# Patient Record
Sex: Female | Born: 1965 | Race: White | Hispanic: No | Marital: Married | Smoking: Never smoker
Health system: Southern US, Community
[De-identification: ages and names within clinical notes are randomized; demographics above are authoritative.]

## PROBLEM LIST (undated history)

## (undated) ENCOUNTER — Emergency Department (HOSPITAL_BASED_OUTPATIENT_CLINIC_OR_DEPARTMENT_OTHER): Admission: EM | Payer: BC Managed Care – PPO | Source: Home / Self Care

## (undated) DIAGNOSIS — D594 Other nonautoimmune hemolytic anemias: Secondary | ICD-10-CM

## (undated) DIAGNOSIS — I10 Essential (primary) hypertension: Secondary | ICD-10-CM

## (undated) DIAGNOSIS — E669 Obesity, unspecified: Secondary | ICD-10-CM

## (undated) DIAGNOSIS — R519 Headache, unspecified: Secondary | ICD-10-CM

## (undated) DIAGNOSIS — F419 Anxiety disorder, unspecified: Secondary | ICD-10-CM

## (undated) DIAGNOSIS — R51 Headache: Secondary | ICD-10-CM

## (undated) DIAGNOSIS — M1990 Unspecified osteoarthritis, unspecified site: Secondary | ICD-10-CM

## (undated) DIAGNOSIS — Z8719 Personal history of other diseases of the digestive system: Secondary | ICD-10-CM

## (undated) HISTORY — DX: Essential (primary) hypertension: I10

## (undated) HISTORY — PX: APPENDECTOMY: SHX54

## (undated) HISTORY — PX: VAGINAL HYSTERECTOMY: SUR661

## (undated) HISTORY — DX: Other nonautoimmune hemolytic anemias: D59.4

## (undated) HISTORY — PX: OOPHORECTOMY: SHX86

## (undated) HISTORY — PX: BACK SURGERY: SHX140

## (undated) HISTORY — DX: Anxiety disorder, unspecified: F41.9

## (undated) HISTORY — PX: TUBAL LIGATION: SHX77

## (undated) HISTORY — DX: Obesity, unspecified: E66.9

## (undated) HISTORY — PX: TOTAL ABDOMINAL HYSTERECTOMY: SHX209

---

## 1992-03-17 HISTORY — PX: LAPAROSCOPIC CHOLECYSTECTOMY: SUR755

## 1997-08-30 ENCOUNTER — Ambulatory Visit (HOSPITAL_COMMUNITY): Admission: RE | Admit: 1997-08-30 | Discharge: 1997-08-30 | Payer: Self-pay | Admitting: Internal Medicine

## 1998-07-19 ENCOUNTER — Encounter: Payer: Self-pay | Admitting: Urology

## 1998-07-19 ENCOUNTER — Inpatient Hospital Stay (HOSPITAL_COMMUNITY): Admission: RE | Admit: 1998-07-19 | Discharge: 1998-07-21 | Payer: Self-pay | Admitting: Gynecology

## 1998-09-12 ENCOUNTER — Encounter: Admission: RE | Admit: 1998-09-12 | Discharge: 1998-12-11 | Payer: Self-pay | Admitting: Gynecology

## 1999-01-16 ENCOUNTER — Encounter: Payer: Self-pay | Admitting: Internal Medicine

## 1999-01-16 ENCOUNTER — Emergency Department (HOSPITAL_COMMUNITY): Admission: EM | Admit: 1999-01-16 | Discharge: 1999-01-16 | Payer: Self-pay | Admitting: Internal Medicine

## 2000-09-10 ENCOUNTER — Other Ambulatory Visit: Admission: RE | Admit: 2000-09-10 | Discharge: 2000-09-10 | Payer: Self-pay | Admitting: Gynecology

## 2001-12-08 ENCOUNTER — Other Ambulatory Visit: Admission: RE | Admit: 2001-12-08 | Discharge: 2001-12-08 | Payer: Self-pay | Admitting: Gynecology

## 2002-04-20 ENCOUNTER — Encounter: Payer: Self-pay | Admitting: Gynecology

## 2002-04-20 ENCOUNTER — Encounter (INDEPENDENT_AMBULATORY_CARE_PROVIDER_SITE_OTHER): Payer: Self-pay

## 2002-04-20 ENCOUNTER — Inpatient Hospital Stay (HOSPITAL_COMMUNITY): Admission: RE | Admit: 2002-04-20 | Discharge: 2002-04-22 | Payer: Self-pay | Admitting: Gynecology

## 2002-12-26 ENCOUNTER — Encounter: Admission: RE | Admit: 2002-12-26 | Discharge: 2002-12-26 | Payer: Self-pay | Admitting: Family Medicine

## 2002-12-26 ENCOUNTER — Encounter: Payer: Self-pay | Admitting: Family Medicine

## 2003-05-24 ENCOUNTER — Ambulatory Visit (HOSPITAL_COMMUNITY): Admission: RE | Admit: 2003-05-24 | Discharge: 2003-05-24 | Payer: Self-pay | Admitting: Internal Medicine

## 2003-11-24 ENCOUNTER — Other Ambulatory Visit: Admission: RE | Admit: 2003-11-24 | Discharge: 2003-11-24 | Payer: Self-pay | Admitting: Gynecology

## 2004-02-12 ENCOUNTER — Ambulatory Visit: Payer: Self-pay | Admitting: Internal Medicine

## 2004-02-23 ENCOUNTER — Ambulatory Visit: Payer: Self-pay | Admitting: Family Medicine

## 2004-04-19 ENCOUNTER — Ambulatory Visit: Payer: Self-pay | Admitting: Family Medicine

## 2004-06-07 ENCOUNTER — Ambulatory Visit: Payer: Self-pay | Admitting: Family Medicine

## 2004-09-05 ENCOUNTER — Ambulatory Visit: Payer: Self-pay | Admitting: Family Medicine

## 2005-01-13 ENCOUNTER — Other Ambulatory Visit: Admission: RE | Admit: 2005-01-13 | Discharge: 2005-01-13 | Payer: Self-pay | Admitting: Gynecology

## 2005-01-16 ENCOUNTER — Encounter (INDEPENDENT_AMBULATORY_CARE_PROVIDER_SITE_OTHER): Payer: Self-pay | Admitting: Specialist

## 2005-01-16 ENCOUNTER — Ambulatory Visit (HOSPITAL_COMMUNITY): Admission: RE | Admit: 2005-01-16 | Discharge: 2005-01-16 | Payer: Self-pay | Admitting: Family Medicine

## 2005-01-16 ENCOUNTER — Ambulatory Visit: Payer: Self-pay | Admitting: Family Medicine

## 2005-01-17 ENCOUNTER — Observation Stay (HOSPITAL_COMMUNITY): Admission: EM | Admit: 2005-01-17 | Discharge: 2005-01-18 | Payer: Self-pay | Admitting: General Surgery

## 2005-02-18 ENCOUNTER — Ambulatory Visit: Payer: Self-pay | Admitting: Family Medicine

## 2005-02-20 ENCOUNTER — Ambulatory Visit: Payer: Self-pay | Admitting: Family Medicine

## 2005-02-20 ENCOUNTER — Inpatient Hospital Stay (HOSPITAL_COMMUNITY): Admission: EM | Admit: 2005-02-20 | Discharge: 2005-02-23 | Payer: Self-pay | Admitting: Emergency Medicine

## 2005-02-25 ENCOUNTER — Ambulatory Visit: Payer: Self-pay | Admitting: Family Medicine

## 2005-03-21 ENCOUNTER — Ambulatory Visit: Payer: Self-pay | Admitting: Family Medicine

## 2005-03-25 ENCOUNTER — Ambulatory Visit: Payer: Self-pay | Admitting: Family Medicine

## 2005-06-06 ENCOUNTER — Ambulatory Visit: Payer: Self-pay | Admitting: Family Medicine

## 2005-06-25 ENCOUNTER — Ambulatory Visit: Payer: Self-pay

## 2005-08-12 ENCOUNTER — Ambulatory Visit: Payer: Self-pay | Admitting: Family Medicine

## 2005-11-04 ENCOUNTER — Ambulatory Visit: Payer: Self-pay | Admitting: Family Medicine

## 2006-01-03 ENCOUNTER — Ambulatory Visit: Payer: Self-pay | Admitting: Family Medicine

## 2006-02-20 ENCOUNTER — Ambulatory Visit: Payer: Self-pay | Admitting: Family Medicine

## 2006-04-07 ENCOUNTER — Ambulatory Visit: Payer: Self-pay | Admitting: Family Medicine

## 2006-04-20 DIAGNOSIS — F411 Generalized anxiety disorder: Secondary | ICD-10-CM | POA: Insufficient documentation

## 2006-04-20 DIAGNOSIS — J45901 Unspecified asthma with (acute) exacerbation: Secondary | ICD-10-CM | POA: Insufficient documentation

## 2006-04-20 DIAGNOSIS — Z8669 Personal history of other diseases of the nervous system and sense organs: Secondary | ICD-10-CM

## 2006-05-08 ENCOUNTER — Ambulatory Visit: Payer: Self-pay | Admitting: Family Medicine

## 2006-05-08 ENCOUNTER — Encounter: Admission: RE | Admit: 2006-05-08 | Discharge: 2006-05-08 | Payer: Self-pay | Admitting: Family Medicine

## 2006-05-26 ENCOUNTER — Other Ambulatory Visit: Admission: RE | Admit: 2006-05-26 | Discharge: 2006-05-26 | Payer: Self-pay | Admitting: Gynecology

## 2006-09-01 ENCOUNTER — Encounter: Payer: Self-pay | Admitting: Family Medicine

## 2006-09-01 ENCOUNTER — Telehealth (INDEPENDENT_AMBULATORY_CARE_PROVIDER_SITE_OTHER): Payer: Self-pay | Admitting: *Deleted

## 2006-10-15 ENCOUNTER — Ambulatory Visit: Payer: Self-pay | Admitting: Family Medicine

## 2006-10-15 DIAGNOSIS — J209 Acute bronchitis, unspecified: Secondary | ICD-10-CM | POA: Insufficient documentation

## 2006-10-16 ENCOUNTER — Telehealth (INDEPENDENT_AMBULATORY_CARE_PROVIDER_SITE_OTHER): Payer: Self-pay | Admitting: *Deleted

## 2006-11-23 ENCOUNTER — Ambulatory Visit: Payer: Self-pay | Admitting: Family Medicine

## 2006-11-24 ENCOUNTER — Ambulatory Visit: Payer: Self-pay | Admitting: Family Medicine

## 2006-11-24 DIAGNOSIS — J019 Acute sinusitis, unspecified: Secondary | ICD-10-CM

## 2006-11-24 LAB — CONVERTED CEMR LAB
Bilirubin Urine: NEGATIVE
Glucose, Urine, Semiquant: NEGATIVE
pH: 6.5

## 2006-12-07 ENCOUNTER — Telehealth (INDEPENDENT_AMBULATORY_CARE_PROVIDER_SITE_OTHER): Payer: Self-pay | Admitting: *Deleted

## 2007-01-14 ENCOUNTER — Telehealth (INDEPENDENT_AMBULATORY_CARE_PROVIDER_SITE_OTHER): Payer: Self-pay | Admitting: *Deleted

## 2007-01-20 ENCOUNTER — Ambulatory Visit: Payer: Self-pay | Admitting: Family Medicine

## 2007-01-29 ENCOUNTER — Ambulatory Visit: Payer: Self-pay | Admitting: Family Medicine

## 2007-03-22 ENCOUNTER — Telehealth (INDEPENDENT_AMBULATORY_CARE_PROVIDER_SITE_OTHER): Payer: Self-pay | Admitting: *Deleted

## 2007-04-23 DIAGNOSIS — J069 Acute upper respiratory infection, unspecified: Secondary | ICD-10-CM | POA: Insufficient documentation

## 2007-04-30 ENCOUNTER — Telehealth (INDEPENDENT_AMBULATORY_CARE_PROVIDER_SITE_OTHER): Payer: Self-pay | Admitting: *Deleted

## 2007-05-01 ENCOUNTER — Ambulatory Visit: Payer: Self-pay | Admitting: Family Medicine

## 2007-05-01 DIAGNOSIS — G43701 Chronic migraine without aura, not intractable, with status migrainosus: Secondary | ICD-10-CM | POA: Insufficient documentation

## 2007-05-03 ENCOUNTER — Ambulatory Visit: Payer: Self-pay | Admitting: Family Medicine

## 2007-05-07 ENCOUNTER — Telehealth (INDEPENDENT_AMBULATORY_CARE_PROVIDER_SITE_OTHER): Payer: Self-pay | Admitting: *Deleted

## 2007-05-12 ENCOUNTER — Telehealth (INDEPENDENT_AMBULATORY_CARE_PROVIDER_SITE_OTHER): Payer: Self-pay | Admitting: *Deleted

## 2007-05-13 ENCOUNTER — Telehealth (INDEPENDENT_AMBULATORY_CARE_PROVIDER_SITE_OTHER): Payer: Self-pay | Admitting: *Deleted

## 2007-05-14 ENCOUNTER — Ambulatory Visit: Payer: Self-pay

## 2007-05-14 ENCOUNTER — Telehealth: Payer: Self-pay | Admitting: Family Medicine

## 2007-05-14 ENCOUNTER — Ambulatory Visit: Payer: Self-pay | Admitting: Family Medicine

## 2007-05-14 DIAGNOSIS — R300 Dysuria: Secondary | ICD-10-CM | POA: Insufficient documentation

## 2007-05-14 DIAGNOSIS — R609 Edema, unspecified: Secondary | ICD-10-CM

## 2007-05-14 DIAGNOSIS — R252 Cramp and spasm: Secondary | ICD-10-CM

## 2007-05-15 ENCOUNTER — Encounter: Payer: Self-pay | Admitting: Family Medicine

## 2007-05-17 ENCOUNTER — Telehealth (INDEPENDENT_AMBULATORY_CARE_PROVIDER_SITE_OTHER): Payer: Self-pay | Admitting: *Deleted

## 2007-05-18 ENCOUNTER — Telehealth (INDEPENDENT_AMBULATORY_CARE_PROVIDER_SITE_OTHER): Payer: Self-pay | Admitting: *Deleted

## 2007-05-19 ENCOUNTER — Ambulatory Visit: Payer: Self-pay | Admitting: Family Medicine

## 2007-05-20 ENCOUNTER — Telehealth (INDEPENDENT_AMBULATORY_CARE_PROVIDER_SITE_OTHER): Payer: Self-pay | Admitting: *Deleted

## 2007-05-20 LAB — CONVERTED CEMR LAB: Sed Rate: 29 mm/hr — ABNORMAL HIGH (ref 0–25)

## 2007-05-26 ENCOUNTER — Ambulatory Visit: Payer: Self-pay | Admitting: Family Medicine

## 2007-05-26 DIAGNOSIS — M545 Low back pain: Secondary | ICD-10-CM | POA: Insufficient documentation

## 2007-08-20 ENCOUNTER — Ambulatory Visit: Payer: Self-pay | Admitting: Family Medicine

## 2007-08-26 ENCOUNTER — Ambulatory Visit: Payer: Self-pay | Admitting: Family Medicine

## 2007-08-27 ENCOUNTER — Telehealth (INDEPENDENT_AMBULATORY_CARE_PROVIDER_SITE_OTHER): Payer: Self-pay | Admitting: *Deleted

## 2007-09-28 ENCOUNTER — Emergency Department (HOSPITAL_COMMUNITY): Admission: EM | Admit: 2007-09-28 | Discharge: 2007-09-28 | Payer: Self-pay | Admitting: Emergency Medicine

## 2007-10-06 ENCOUNTER — Ambulatory Visit: Payer: Self-pay | Admitting: Family Medicine

## 2007-10-06 DIAGNOSIS — M79609 Pain in unspecified limb: Secondary | ICD-10-CM

## 2007-11-24 ENCOUNTER — Telehealth (INDEPENDENT_AMBULATORY_CARE_PROVIDER_SITE_OTHER): Payer: Self-pay | Admitting: *Deleted

## 2007-11-26 ENCOUNTER — Ambulatory Visit: Payer: Self-pay | Admitting: Family Medicine

## 2007-12-02 ENCOUNTER — Ambulatory Visit: Payer: Self-pay | Admitting: Family Medicine

## 2007-12-29 ENCOUNTER — Telehealth (INDEPENDENT_AMBULATORY_CARE_PROVIDER_SITE_OTHER): Payer: Self-pay | Admitting: *Deleted

## 2007-12-30 ENCOUNTER — Telehealth (INDEPENDENT_AMBULATORY_CARE_PROVIDER_SITE_OTHER): Payer: Self-pay | Admitting: *Deleted

## 2008-01-03 ENCOUNTER — Telehealth: Payer: Self-pay | Admitting: Internal Medicine

## 2008-01-03 ENCOUNTER — Telehealth (INDEPENDENT_AMBULATORY_CARE_PROVIDER_SITE_OTHER): Payer: Self-pay | Admitting: *Deleted

## 2008-01-04 ENCOUNTER — Encounter: Payer: Self-pay | Admitting: Family Medicine

## 2008-01-13 ENCOUNTER — Encounter: Payer: Self-pay | Admitting: Family Medicine

## 2008-02-02 ENCOUNTER — Ambulatory Visit: Payer: Self-pay | Admitting: Family Medicine

## 2008-02-08 ENCOUNTER — Ambulatory Visit: Payer: Self-pay | Admitting: Family Medicine

## 2008-02-08 DIAGNOSIS — I10 Essential (primary) hypertension: Secondary | ICD-10-CM | POA: Insufficient documentation

## 2008-02-24 ENCOUNTER — Ambulatory Visit: Payer: Self-pay | Admitting: Family Medicine

## 2008-02-26 LAB — CONVERTED CEMR LAB
BUN: 8 mg/dL (ref 6–23)
Chloride: 103 meq/L (ref 96–112)
GFR calc non Af Amer: 97 mL/min
Potassium: 3.9 meq/L (ref 3.5–5.1)
Sodium: 140 meq/L (ref 135–145)

## 2008-02-29 ENCOUNTER — Encounter (INDEPENDENT_AMBULATORY_CARE_PROVIDER_SITE_OTHER): Payer: Self-pay | Admitting: *Deleted

## 2008-05-02 ENCOUNTER — Ambulatory Visit: Payer: Self-pay | Admitting: Family Medicine

## 2008-05-23 ENCOUNTER — Telehealth: Payer: Self-pay | Admitting: Family Medicine

## 2008-06-15 ENCOUNTER — Telehealth (INDEPENDENT_AMBULATORY_CARE_PROVIDER_SITE_OTHER): Payer: Self-pay | Admitting: *Deleted

## 2008-06-27 ENCOUNTER — Ambulatory Visit: Payer: Self-pay | Admitting: Family Medicine

## 2008-08-31 ENCOUNTER — Telehealth: Payer: Self-pay | Admitting: Family Medicine

## 2008-09-01 ENCOUNTER — Ambulatory Visit: Payer: Self-pay | Admitting: Family Medicine

## 2008-09-22 ENCOUNTER — Telehealth (INDEPENDENT_AMBULATORY_CARE_PROVIDER_SITE_OTHER): Payer: Self-pay | Admitting: *Deleted

## 2008-11-22 ENCOUNTER — Ambulatory Visit: Payer: Self-pay | Admitting: Family Medicine

## 2008-12-18 ENCOUNTER — Telehealth (INDEPENDENT_AMBULATORY_CARE_PROVIDER_SITE_OTHER): Payer: Self-pay | Admitting: *Deleted

## 2009-03-02 ENCOUNTER — Telehealth (INDEPENDENT_AMBULATORY_CARE_PROVIDER_SITE_OTHER): Payer: Self-pay | Admitting: *Deleted

## 2009-03-21 ENCOUNTER — Ambulatory Visit: Payer: Self-pay | Admitting: Family Medicine

## 2009-03-26 ENCOUNTER — Telehealth (INDEPENDENT_AMBULATORY_CARE_PROVIDER_SITE_OTHER): Payer: Self-pay | Admitting: *Deleted

## 2009-03-26 LAB — CONVERTED CEMR LAB
CO2: 29 meq/L (ref 19–32)
Chloride: 104 meq/L (ref 96–112)
Potassium: 3.6 meq/L (ref 3.5–5.1)
Sodium: 142 meq/L (ref 135–145)

## 2009-04-16 ENCOUNTER — Ambulatory Visit: Payer: Self-pay | Admitting: Family Medicine

## 2009-04-26 ENCOUNTER — Telehealth: Payer: Self-pay | Admitting: Family Medicine

## 2009-04-26 DIAGNOSIS — E876 Hypokalemia: Secondary | ICD-10-CM

## 2009-04-27 ENCOUNTER — Ambulatory Visit: Payer: Self-pay | Admitting: Family Medicine

## 2009-05-01 ENCOUNTER — Telehealth (INDEPENDENT_AMBULATORY_CARE_PROVIDER_SITE_OTHER): Payer: Self-pay | Admitting: *Deleted

## 2009-05-08 ENCOUNTER — Telehealth (INDEPENDENT_AMBULATORY_CARE_PROVIDER_SITE_OTHER): Payer: Self-pay | Admitting: *Deleted

## 2009-05-10 ENCOUNTER — Telehealth: Payer: Self-pay | Admitting: Family Medicine

## 2009-06-25 ENCOUNTER — Telehealth: Payer: Self-pay | Admitting: Family Medicine

## 2009-07-27 ENCOUNTER — Telehealth (INDEPENDENT_AMBULATORY_CARE_PROVIDER_SITE_OTHER): Payer: Self-pay | Admitting: *Deleted

## 2009-08-07 ENCOUNTER — Telehealth (INDEPENDENT_AMBULATORY_CARE_PROVIDER_SITE_OTHER): Payer: Self-pay | Admitting: *Deleted

## 2009-08-30 ENCOUNTER — Telehealth (INDEPENDENT_AMBULATORY_CARE_PROVIDER_SITE_OTHER): Payer: Self-pay | Admitting: *Deleted

## 2009-09-04 ENCOUNTER — Telehealth: Payer: Self-pay | Admitting: Family Medicine

## 2009-09-06 ENCOUNTER — Telehealth: Payer: Self-pay | Admitting: Family Medicine

## 2009-09-21 ENCOUNTER — Telehealth (INDEPENDENT_AMBULATORY_CARE_PROVIDER_SITE_OTHER): Payer: Self-pay | Admitting: *Deleted

## 2009-10-04 ENCOUNTER — Encounter: Payer: Self-pay | Admitting: Family Medicine

## 2009-10-05 ENCOUNTER — Ambulatory Visit: Payer: Self-pay | Admitting: Family Medicine

## 2009-10-05 LAB — CONVERTED CEMR LAB
CO2: 29 meq/L (ref 19–32)
Chloride: 105 meq/L (ref 96–112)
Glucose, Bld: 80 mg/dL (ref 70–99)
Potassium: 4.3 meq/L (ref 3.5–5.1)
Sodium: 142 meq/L (ref 135–145)

## 2009-11-09 ENCOUNTER — Ambulatory Visit: Payer: Self-pay | Admitting: Family Medicine

## 2009-11-15 ENCOUNTER — Telehealth: Payer: Self-pay | Admitting: Family Medicine

## 2009-11-22 ENCOUNTER — Ambulatory Visit: Payer: Self-pay | Admitting: Family Medicine

## 2009-11-22 DIAGNOSIS — J029 Acute pharyngitis, unspecified: Secondary | ICD-10-CM

## 2009-11-23 ENCOUNTER — Encounter: Payer: Self-pay | Admitting: Family Medicine

## 2009-11-28 ENCOUNTER — Telehealth (INDEPENDENT_AMBULATORY_CARE_PROVIDER_SITE_OTHER): Payer: Self-pay | Admitting: *Deleted

## 2009-12-04 ENCOUNTER — Telehealth (INDEPENDENT_AMBULATORY_CARE_PROVIDER_SITE_OTHER): Payer: Self-pay | Admitting: *Deleted

## 2010-01-02 ENCOUNTER — Telehealth: Payer: Self-pay | Admitting: Family Medicine

## 2010-01-10 ENCOUNTER — Telehealth (INDEPENDENT_AMBULATORY_CARE_PROVIDER_SITE_OTHER): Payer: Self-pay | Admitting: *Deleted

## 2010-01-14 ENCOUNTER — Telehealth: Payer: Self-pay | Admitting: Family Medicine

## 2010-01-23 ENCOUNTER — Telehealth (INDEPENDENT_AMBULATORY_CARE_PROVIDER_SITE_OTHER): Payer: Self-pay | Admitting: *Deleted

## 2010-02-05 ENCOUNTER — Ambulatory Visit: Payer: Self-pay | Admitting: Family Medicine

## 2010-02-05 DIAGNOSIS — B356 Tinea cruris: Secondary | ICD-10-CM

## 2010-02-05 LAB — CONVERTED CEMR LAB
Bilirubin Urine: NEGATIVE
Blood in Urine, dipstick: NEGATIVE
Ketones, urine, test strip: NEGATIVE
Nitrite: NEGATIVE
Urobilinogen, UA: 0.2

## 2010-02-06 LAB — CONVERTED CEMR LAB
CO2: 31 meq/L (ref 19–32)
Calcium: 9.2 mg/dL (ref 8.4–10.5)
Chloride: 102 meq/L (ref 96–112)
Creatinine, Ser: 0.8 mg/dL (ref 0.4–1.2)
Glucose, Bld: 74 mg/dL (ref 70–99)

## 2010-03-19 ENCOUNTER — Telehealth: Payer: Self-pay | Admitting: Family Medicine

## 2010-03-21 ENCOUNTER — Ambulatory Visit
Admission: RE | Admit: 2010-03-21 | Discharge: 2010-03-21 | Payer: Self-pay | Source: Home / Self Care | Attending: Family Medicine | Admitting: Family Medicine

## 2010-04-10 ENCOUNTER — Telehealth (INDEPENDENT_AMBULATORY_CARE_PROVIDER_SITE_OTHER): Payer: Self-pay | Admitting: *Deleted

## 2010-04-14 LAB — CONVERTED CEMR LAB
AST: 31 units/L (ref 0–37)
Albumin: 3.5 g/dL (ref 3.5–5.2)
Basophils Absolute: 0.1 10*3/uL (ref 0.0–0.1)
Chloride: 101 meq/L (ref 96–112)
Creatinine, Ser: 0.8 mg/dL (ref 0.4–1.2)
Eosinophils Relative: 5.5 % — ABNORMAL HIGH (ref 0.0–5.0)
Glucose, Bld: 92 mg/dL (ref 70–99)
Glucose, Urine, Semiquant: NEGATIVE
HCT: 37.6 % (ref 36.0–46.0)
Neutrophils Relative %: 61.4 % (ref 43.0–77.0)
RBC: 4.33 M/uL (ref 3.87–5.11)
RDW: 14.1 % (ref 11.5–14.6)
Sodium: 142 meq/L (ref 135–145)
Specific Gravity, Urine: 1.015
Total Bilirubin: 0.6 mg/dL (ref 0.3–1.2)
Total CK: 125 units/L (ref 7–177)
WBC Urine, dipstick: NEGATIVE
WBC: 8.5 10*3/uL (ref 4.5–10.5)
pH: 6

## 2010-04-18 NOTE — Progress Notes (Signed)
Summary: Refill Request  Phone Note Refill Request Call back at [phone] Message from:  Pharmacy on January 23, 2010 10:31 AM  Refills Requested: Medication #1:  KAON-CL-10 10 MEQ  TBCR 1 by mouth two times a day   Dosage confirmed as above?Dosage Confirmed   Supply Requested: 1 month   Last Refilled: 12/17/2009 [facility]  Next Appointment Scheduled: none Initial call taken by: Harold Barban,  January 23, 2010 10:31 AM    Prescriptions: KAON-CL-10 10 MEQ  TBCR (POTASSIUM CHLORIDE) 1 by mouth two times a day  #60 x 0   Entered by:   Almeta Monas CMA (AAMA)   Authorized by:   Loreen Freud DO   Signed by:   Almeta Monas CMA (AAMA) on 01/23/2010   Method used:   Electronically to        [facility]* (retail)       [address]       [place], [place]  [address]       Ph: [phone]       Fax: [phone]   RxID:   [id number]

## 2010-04-18 NOTE — Progress Notes (Signed)
Summary: REFILL REQUEST  Phone Note Refill Request Call back at [phone] Message from:  Pharmacy on September 21, 2009 11:16 AM  Refills Requested: Medication #1:  KAON-CL-10 10 MEQ  TBCR 1 by mouth two times a day   Dosage confirmed as above?Dosage Confirmed   Supply Requested: 1 month   Last Refilled: 08/07/2009 [facility]  [address] [facility] [address]  Next Appointment Scheduled: NONE Initial call taken by: Lavell Islam,  September 21, 2009 11:18 AM    Prescriptions: KAON-CL-10 10 MEQ  TBCR (POTASSIUM CHLORIDE) 1 by mouth two times a day  #60 x 2   Entered by:   Jeremy Johann CMA   Authorized by:   Loreen Freud DO   Signed by:   Jeremy Johann CMA on 09/21/2009   Method used:   Faxed to ...       [facility]* (retail)       [address]       [place], [place]  [address]       Ph: [phone]       Fax: [phone]   RxID:   [id number]

## 2010-04-18 NOTE — Progress Notes (Signed)
Summary: refill  Phone Note Refill Request Message from:  Fax from Pharmacy on April 10, 2010 9:51 AM  Refills Requested: Medication #1:  PROAIR HFA 108 (90 BASE) MCG/ACT  AERS as needed [facility] [address] [facility] - fax [phone]  Initial call taken by: Okey Regal Spring,  April 10, 2010 9:52 AM    Prescriptions: PROAIR HFA 108 (90 BASE) MCG/ACT  AERS (ALBUTEROL SULFATE) as needed  #1 x 1   Entered by:   Almeta Monas CMA (AAMA)   Authorized by:   Loreen Freud DO   Signed by:   Almeta Monas CMA (AAMA) on 04/10/2010   Method used:   Faxed to ...       [facility]* (retail)       [address]       [place], [place]  [address]       Ph: [phone]       Fax: [phone]   RxID:   [id number]

## 2010-04-18 NOTE — Progress Notes (Signed)
Summary: refill  Phone Note Refill Request Message from:  Fax from Pharmacy on November 28, 2009 11:41 AM  Refills Requested: Medication #1:  PROAIR HFA 108 (90 BASE) MCG/ACT  AERS as needed [facility]- fax [phone]  Initial call taken by: Okey Regal Spring,  November 28, 2009 11:42 AM    Prescriptions: PROAIR HFA 108 (90 BASE) MCG/ACT  AERS (ALBUTEROL SULFATE) as needed  #1 x 3   Entered by:   Almeta Monas CMA (AAMA)   Authorized by:   Loreen Freud DO   Signed by:   Almeta Monas CMA (AAMA) on 11/28/2009   Method used:   Faxed to ...       [facility]* (retail)       [address]       [place], [place]  [address]       Ph: [phone]       Fax: [phone]   RxID:   [id number]

## 2010-04-18 NOTE — Progress Notes (Signed)
Summary: Triage: Migraines/Nausea  Phone Note Call from Patient Call back at Work Phone [phone]   Caller: Patient Summary of Call: Spoke with patient, patient with sinus infection and its triggering  Migraines. Needs RX sent to [place] ) for Migraine med.  Patient would also like phenergan cause migraines are making her very nauseated.  Phenergan is not on med list, will have to be ok'd by another Dr.  Shonna Chock  Jul 27, 2009 2:12 PM       Follow-up for Phone Call        Placed on Dr.Paz's ledge for review Follow-up by: Shonna Chock,  Jul 27, 2009 2:26 PM  Additional Follow-up for Phone Call Additional follow up Details #1::        okay Imitrex, #15, no RF   if she has sinusitis and nausea she  needs  to be seen. Either urgent care or  at the Saturday clinic Philpot E. Paz MD  Jul 27, 2009 3:25 PM     Additional Follow-up for Phone Call Additional follow up Details #2::    Patient aware of Dr.Paz's recommendations and is ok with Generic Imitrex being sent and aware of Sat Clinic hours if needed./[facility]  Jul 27, 2009 3:43 PM   Prescriptions: IMITREX 100 MG TABS (SUMATRIPTAN SUCCINATE) as directed  #15 x 0   Entered by:   Shonna Chock   Authorized by:   Loreen Freud DO   Signed by:   Shonna Chock on 07/27/2009   Method used:   Printed then faxed to ...       [facility] (retail)       [address]       [place], [place]  [id number]       Ph: [phone]       Fax: [phone]   RxID:   [id number]

## 2010-04-18 NOTE — Progress Notes (Signed)
Summary: reill  Phone Note Refill Request Message from:  Fax from Pharmacy on January 10, 2010 10:45 AM  Refills Requested: Medication #1:  IMITREX 100 MG TABS as directed [facility] - fax [phone]  Initial call taken by: Okey Regal Spring,  January 10, 2010 10:46 AM    Rx faxed to [facility] 01/10/10 see previous phone note.... Almeta Monas CMA Duncan Dull)  January 10, 2010 1:40 PM

## 2010-04-18 NOTE — Progress Notes (Signed)
Summary: refill(lmom 4/12)  Phone Note Refill Request   Refills Requested: Medication #1:  ALPRAZOLAM 0.5 MG  TABS 1 three times a day as needed   Last Refilled: 03/21/2009 last ov- 04/16/09. Would like refill to go to [facility]. Army Fossa CMA  June 25, 2009 9:57 AM    Follow-up for Phone Call        I can't send 270 xanax to [facility]---- its better to do 90 at a local pharmacy---will [facility] not pay for that at local pharmacy? Follow-up by: Loreen Freud DO,  June 25, 2009 5:17 PM  Additional Follow-up for Phone Call Additional follow up Details #1::        LMTCB. Army Fossa CMA  June 26, 2009 8:31 AM     Additional Follow-up for Phone Call Additional follow up Details #2::    Pt is aware, she would like it sent to [facility]. Army Fossa CMA  June 26, 2009 9:05 AM   New/Updated Medications: ALPRAZOLAM 0.5 MG  TABS (ALPRAZOLAM) 1 three times a day as needed Prescriptions: ALPRAZOLAM 0.5 MG  TABS (ALPRAZOLAM) 1 three times a day as needed  #90 x 0   Entered by:   Army Fossa CMA   Authorized by:   Loreen Freud DO   Signed by:   Army Fossa CMA on 06/26/2009   Method used:   Printed then faxed to ...       [facility]* (retail)       [address]       [place], [place]  [address]       Ph: [phone]       Fax: [phone]   RxID:   [id number]

## 2010-04-18 NOTE — Assessment & Plan Note (Signed)
Summary: sinus infection/ bp check//kn   Vital Signs:  Patient profile:   45 year old female Height:      59 inches Weight:      219 pounds O2 Sat:      97 % on Room air Temp:     98.4 degrees F oral Pulse rate:   82 / minute BP sitting:   110 / 72  (left arm)  Vitals Entered By: Jeremy Johann CMA (October 05, 2009 9:57 AM)  O2 Flow:  Room air CC: sinus infection, bp check, not fasting labs, URI symptoms   History of Present Illness:       This is a 45 year old woman who presents with URI symptoms.  The symptoms began 1 week ago.  Pt here c/o sinus pressure and wheezing.  The patient complains of nasal congestion, purulent nasal discharge, productive cough, earache, and sick contacts.  Associated symptoms include dyspnea and wheezing.  The patient denies fever, low-grade fever (<100.5 degrees), fever of 100.5-103 degrees, fever of 103.1-104 degrees, fever to >104 degrees, stiff neck, rash, vomiting, diarrhea, use of an antipyretic, and response to antipyretic.  The patient also reports headache.  The patient denies itchy watery eyes, itchy throat, sneezing, seasonal symptoms, response to antihistamine, muscle aches, and severe fatigue.  The patient denies the following risk factors for Strep sinusitis: unilateral facial pain, unilateral nasal discharge, poor response to decongestant, double sickening, tooth pain, Strep exposure, tender adenopathy, and absence of cough.    Current Medications (verified): 1)  Singulair 10 Mg Tabs (Montelukast Sodium) .Marland Kitchen.. 1 By Mouth Once Daily 2)  Veramyst 27.5 Mcg/spray Susp (Fluticasone Furoate) .... 2 Sprays Each Nostril Once Daily 3)  Alprazolam 0.5 Mg  Tabs (Alprazolam) .Marland Kitchen.. 1 Three Times A Day As Needed 4)  Proair Hfa 108 (90 Base) Mcg/act  Aers (Albuterol Sulfate) .... As Needed 5)  Xopenex 1.25 Mg/1ml  Nebu (Levalbuterol Hcl) .... Nebulizer As Needed 6)  Symbicort 160-4.5 Mcg/act Aero (Budesonide-Formoterol Fumarate) .... 2 Puffs Two Times A Day 7)   Kaon-Cl-10 10 Meq  Tbcr (Potassium Chloride) .Marland Kitchen.. 1 By Mouth Two Times A Day 8)  Zyrtec Allergy 10 Mg  Tabs (Cetirizine Hcl) .Marland Kitchen.. 1 By Mouth Once Daily 9)  Imitrex 100 Mg Tabs (Sumatriptan Succinate) .... As Directed 10)  Lisinopril-Hydrochlorothiazide 10-12.5 Mg Tabs (Lisinopril-Hydrochlorothiazide) .Marland Kitchen.. 1 By Mouth Once Daily 11)  Hydrochlorothiazide 25 Mg Tabs (Hydrochlorothiazide) .Marland Kitchen.. 1 By Mouth Once Daily 12)  Biaxin Xl Pac 500 Mg Xr24h-Tab (Clarithromycin) .... 2 By Mouth Once Daily  Allergies (verified): 1)  ! Pcn 2)  ! * Ivp Dye  Past History:  Past medical, surgical, family and social histories (including risk factors) reviewed for relevance to current acute and chronic problems.  Past Medical History: Reviewed history from 02/08/2008 and no changes required. Asthma Anxiety MHA Hypertension  Family History: Reviewed history from 02/24/2008 and no changes required. Family History of Asthma Family History Diabetes 1st degree relative Family History High cholesterol Family History Hypertension throat CA--F  Social History: Reviewed history from 02/24/2008 and no changes required. Married Never Smoked Drug use-no Regular exercise-no  Review of Systems      See HPI  Physical Exam  General:  Well-developed,well-nourished,in no acute distress; alert,appropriate and cooperative throughout examination Ears:  External ear exam shows no significant lesions or deformities.  Otoscopic examination reveals clear canals, tympanic membranes are intact bilaterally without bulging, retraction, inflammation or discharge. Hearing is grossly normal bilaterally. Nose:  L frontal sinus tenderness,  L maxillary sinus tenderness, R frontal sinus tenderness, and R maxillary sinus tenderness.   Mouth:  Oral mucosa and oropharynx without lesions or exudates.  Teeth in good repair. Neck:  No deformities, masses, or tenderness noted. Lungs:  R wheezes and L wheezes.   Heart:  Normal rate  and regular rhythm. S1 and S2 normal without gallop, murmur, click, rub or other extra sounds. Extremities:  No clubbing, cyanosis, edema, or deformity noted with normal full range of motion of all joints.   Cervical Nodes:  No lymphadenopathy noted Psych:  Cognition and judgment appear intact. Alert and cooperative with normal attention span and concentration. No apparent delusions, illusions, hallucinations   Impression & Recommendations:  Problem # 1:  ASTHMATIC BRONCHITIS, ACUTE (ICD-466.0)  The following medications were removed from the medication list:    Tussionex Pennkinetic Er 8-10 Mg/38ml Lqcr (Chlorpheniramine-hydrocodone) .Marland Kitchen... 1 tsp by mouth at bedtime Her updated medication list for this problem includes:    Singulair 10 Mg Tabs (Montelukast sodium) .Marland Kitchen... 1 by mouth once daily    Proair Hfa 108 (90 Base) Mcg/act Aers (Albuterol sulfate) .Marland Kitchen... As needed    Xopenex 1.25 Mg/58ml Nebu (Levalbuterol hcl) ..... Nebulizer as needed    Symbicort 160-4.5 Mcg/act Aero (Budesonide-formoterol fumarate) .Marland Kitchen... 2 puffs two times a day    Biaxin Xl Pac 500 Mg Xr24h-tab (Clarithromycin) .Marland Kitchen... 2 by mouth once daily  Take antibiotics and other medications as directed. Encouraged to push clear liquids, get enough rest, and take acetaminophen as needed. To be seen in 5-7 days if no improvement, sooner if worse.  Problem # 2:  SINUSITIS, ACUTE NOS (ICD-461.9)  The following medications were removed from the medication list:    Tussionex Pennkinetic Er 8-10 Mg/56ml Lqcr (Chlorpheniramine-hydrocodone) .Marland Kitchen... 1 tsp by mouth at bedtime Her updated medication list for this problem includes:    Veramyst 27.5 Mcg/spray Susp (Fluticasone furoate) .Marland Kitchen... 2 sprays each nostril once daily    Biaxin Xl Pac 500 Mg Xr24h-tab (Clarithromycin) .Marland Kitchen... 2 by mouth once daily  Instructed on treatment. Call if symptoms persist or worsen.   Problem # 3:  HYPOKALEMIA (ICD-276.8)  Orders: Venipuncture ([address]) TLB-BMP  (Basic Metabolic Panel-BMET) (80048-METABOL) Specimen Handling ([address])  Complete Medication List: 1)  Singulair 10 Mg Tabs (Montelukast sodium) .Marland Kitchen.. 1 by mouth once daily 2)  Veramyst 27.5 Mcg/spray Susp (Fluticasone furoate) .... 2 sprays each nostril once daily 3)  Alprazolam 0.5 Mg Tabs (Alprazolam) .Marland Kitchen.. 1 three times a day as needed 4)  Proair Hfa 108 (90 Base) Mcg/act Aers (Albuterol sulfate) .... As needed 5)  Xopenex 1.25 Mg/73ml Nebu (Levalbuterol hcl) .... Nebulizer as needed 6)  Symbicort 160-4.5 Mcg/act Aero (Budesonide-formoterol fumarate) .... 2 puffs two times a day 7)  Kaon-cl-10 10 Meq Tbcr (Potassium chloride) .Marland Kitchen.. 1 by mouth two times a day 8)  Zyrtec Allergy 10 Mg Tabs (Cetirizine hcl) .Marland Kitchen.. 1 by mouth once daily 9)  Imitrex 100 Mg Tabs (Sumatriptan succinate) .... As directed 10)  Lisinopril-hydrochlorothiazide 10-12.5 Mg Tabs (Lisinopril-hydrochlorothiazide) .Marland Kitchen.. 1 by mouth once daily 11)  Hydrochlorothiazide 25 Mg Tabs (Hydrochlorothiazide) .Marland Kitchen.. 1 by mouth once daily 12)  Biaxin Xl Pac 500 Mg Xr24h-tab (Clarithromycin) .... 2 by mouth once daily Prescriptions: SINGULAIR 10 MG TABS (MONTELUKAST SODIUM) 1 by mouth once daily  #90 x 3   Entered and Authorized by:   Loreen Freud DO   Signed by:   Loreen Freud DO on 10/05/2009   Method used:   Electronically to  [facility] (retail)       [address]       [place], [place]  [address]       Ph: [phone]       Fax: [phone]   RxID:   [id number] BIAXIN XL PAC 500 MG XR24H-TAB (CLARITHROMYCIN) 2 by mouth once daily  #28 x 0   Entered and Authorized by:   Loreen Freud DO   Signed by:   Loreen Freud DO on 10/05/2009   Method used:   Electronically to        [facility]* (retail)       [address]       [place], [place]  [address]       Ph: [phone]       Fax: [phone]   RxID:   [id number]

## 2010-04-18 NOTE — Progress Notes (Signed)
Summary: Refill Requests  Phone Note Refill Request Call back at [phone] Message from:  Pharmacy on Aug 07, 2009 1:21 PM  Refills Requested: Medication #1:  PROAIR HFA 108 (90 BASE) MCG/ACT  AERS as needed   Dosage confirmed as above?Dosage Confirmed   Last Refilled: 06/30/2009  Medication #2:  KAON-CL-10 10 MEQ  TBCR 1 by mouth two times a day   Dosage confirmed as above?Dosage Confirmed   Supply Requested: 1 month [facility]  Next Appointment Scheduled: none Initial call taken by: Harold Barban,  Aug 07, 2009 1:22 PM    Prescriptions: KAON-CL-10 10 MEQ  TBCR (POTASSIUM CHLORIDE) 1 by mouth two times a day  #60 x 0   Entered by:   Army Fossa CMA   Authorized by:   Loreen Freud DO   Signed by:   Army Fossa CMA on 08/07/2009   Method used:   Electronically to        [facility] (retail)       [address]       [place], [place]  [address]       Ph: [phone]       Fax: [phone]   RxID:   [id number] PROAIR HFA 108 (90 BASE) MCG/ACT  AERS (ALBUTEROL SULFATE) as needed  #1 x 3   Entered by:   Army Fossa CMA   Authorized by:   Loreen Freud DO   Signed by:   Army Fossa CMA on 08/07/2009   Method used:   Electronically to        [facility] (retail)       [address]       [place], [place]  [address]       Ph: [phone]       Fax: [phone]   RxID:   [id number]

## 2010-04-18 NOTE — Progress Notes (Signed)
Summary: refill request  Phone Note Refill Request Call back at [phone] Message from:  Pharmacy on November 15, 2009 2:45 PM  Refills Requested: Medication #1:  ALPRAZOLAM 0.5 MG  TABS 1 three times a day as needed   Dosage confirmed as above?Dosage Confirmed   Supply Requested: 3 months   Last Refilled: 09/04/2009   Dosage confirmed as above?Dosage Confirmed [facility], Last OV 11/09/09 last filled 09/04/09  Next Appointment Scheduled: none Initial call taken by: Lavell Islam,  November 15, 2009 2:45 PM  Follow-up for Phone Call        refill x1 Follow-up by: Loreen Freud DO,  November 15, 2009 4:00 PM    Prescriptions: ALPRAZOLAM 0.5 MG  TABS (ALPRAZOLAM) 1 three times a day as needed  #90 x 0   Entered by:   Jeremy Johann CMA   Authorized by:   Loreen Freud DO   Signed by:   Jeremy Johann CMA on 11/16/2009   Method used:   Printed then faxed to ...       [facility] (retail)       [address]       [place], [place]  [address]       Ph: [phone]       Fax: [phone]   RxID:   [id number]

## 2010-04-18 NOTE — Progress Notes (Signed)
Summary: Refill Request  Phone Note Refill Request Call back at [phone] Message from:  Pharmacy on March 19, 2010 10:36 AM  Refills Requested: Medication #1:  KAON-CL-10 10 MEQ  TBCR 1 by mouth two times a day   Dosage confirmed as above?Dosage Confirmed   Supply Requested: 60   Last Refilled: 01/23/2010  Medication #2:  ALPRAZOLAM 0.5 MG  TABS 1 three times a day as needed   Dosage confirmed as above?Dosage Confirmed   Supply Requested: 90   Last Refilled: 01/14/2010 [facility]  Next Appointment Scheduled: none Initial call taken by: Harold Barban,  March 19, 2010 10:36 AM  Follow-up for Phone Call        alprazolam last filled 01/14/10 and pt last seen 02/05/10.Marland KitchenMarland Kitchenplease advise Follow-up by: Almeta Monas CMA Duncan Dull),  March 19, 2010 2:34 PM  Additional Follow-up for Phone Call Additional follow up Details #1::        refill xanax x1  2 refills Additional Follow-up by: Loreen Freud DO,  March 19, 2010 2:40 PM    Prescriptions: ALPRAZOLAM 0.5 MG  TABS (ALPRAZOLAM) 1 three times a day as needed  #90 x 2   Entered by:   Almeta Monas CMA (AAMA)   Authorized by:   Loreen Freud DO   Signed by:   Almeta Monas CMA (AAMA) on 03/19/2010   Method used:   Printed then faxed to ...       [facility] (retail)       [address]       [place], [place]  [address]       Ph: [phone]       Fax: [phone]   RxID:   [id number] KAON-CL-10 10 MEQ  TBCR (POTASSIUM CHLORIDE) 1 by mouth two times a day  #30 x 0   Entered by:   Almeta Monas CMA (AAMA)   Authorized by:   Loreen Freud DO   Signed by:   Almeta Monas CMA (AAMA) on 03/19/2010   Method used:   Faxed to ...       [facility] (retail)       [address]       [place], [place]  [address]       Ph: [phone]       Fax: [phone]   RxID:   [id number]

## 2010-04-18 NOTE — Letter (Signed)
Summary: Out of Work  [facility]  [address] [place], [place] [address]   Phone: [phone]  Fax: [phone]    November 22, 2009   Employee:  Deborah Watts Paro    To Whom It May Concern:   For Medical reasons, please excuse the above named employee from work for the following dates:  Start:   11/22/2009  End:   11/23/2009  If you need additional information, please feel free to contact our office.         Sincerely,    Loreen Freud DO

## 2010-04-18 NOTE — Progress Notes (Signed)
Summary: refill  Phone Note Refill Request Call back at Work Phone [phone] Message from:  Patient on August 30, 2009 3:06 PM  Refills Requested: Medication #1:  SINGULAIR 10 MG TABS 1 by mouth once daily patient wants 30 day supply called in [facility] [phone] she is going oot & wont get mail order in time  Initial call taken by: Okey Regal [place],  August 30, 2009 3:06 PM    Prescriptions: SINGULAIR 10 MG TABS (MONTELUKAST SODIUM) 1 by mouth once daily  #90 x 3   Entered by:   Army Fossa CMA   Authorized by:   Loreen Freud DO   Signed by:   Army Fossa CMA on 08/30/2009   Method used:   Electronically to        [facility] (retail)       [address]       [place], [place]  [address]       Ph: [phone]       Fax: [phone]   RxID:   [id number]

## 2010-04-18 NOTE — Progress Notes (Signed)
Summary: Refill Requests  Phone Note Refill Request Call back at [phone] Message from:  Pharmacy on January 02, 2010 8:15 AM  Refills Requested: Medication #1:  ZOFRAN 8 MG  TABS 1 by mouth q 8h prn.   Dosage confirmed as above?Dosage Confirmed   Brand Name Necessary? No   Supply Requested: 1 month   Last Refilled: 11/09/2009  Medication #2:  IMITREX 100 MG TABS as directed   Dosage confirmed as above?Dosage Confirmed   Brand Name Necessary? No   Supply Requested: 1 month   Last Refilled: 07/27/2009 [facility]  Next Appointment Scheduled: none Initial call taken by: Harold Barban,  January 02, 2010 8:15 AM  Follow-up for Phone Call        please advise.   Almeta Monas CMA Duncan Dull)  January 02, 2010 12:31 PM'  Additional Follow-up for Phone Call Additional follow up Details #1::        refill both x1 Additional Follow-up by: Loreen Freud DO,  January 02, 2010 12:43 PM    Prescriptions: ZOFRAN 8 MG  TABS (ONDANSETRON HCL) 1 by mouth q 8h prn  #24 x 0   Entered by:   Almeta Monas CMA (AAMA)   Authorized by:   Loreen Freud DO   Signed by:   Almeta Monas CMA (AAMA) on 01/02/2010   Method used:   Faxed to ...       [facility] (retail)       [address]       [place], [place]  [address]       Ph: [phone]       Fax: [phone]   RxID:   [id number] IMITREX 100 MG TABS (SUMATRIPTAN SUCCINATE) as directed  #15 x 0   Entered by:   Almeta Monas CMA (AAMA)   Authorized by:   Loreen Freud DO   Signed by:   Almeta Monas CMA (AAMA) on 01/02/2010   Method used:   Faxed to ...       [facility] (retail)       [address]       [place], [place]  [address]       Ph: [phone]       Fax: [phone]   RxID:   [id number]

## 2010-04-18 NOTE — Assessment & Plan Note (Signed)
Summary: sinus problems/kn   Vital Signs:  Patient profile:   45 year old female Weight:      217 pounds Temp:     98.3 degrees F oral BP sitting:   118 / 82  (left arm)  Vitals Entered By: Doristine Devoid CMA (November 09, 2009 11:48 AM) CC: sinus pressure and facial pain x1 week    History of Present Illness: 45 yo woman here today for ? sinus infxn.  + sinus pressure and facial pain.  sxs started 1 week ago.  had pain for 2 days and then sxs improved but returned after 1-2 days.  + dizziness.  mild L ear pain.  no sore throat.  intermittant cough.  no fevers.  no known sick contacts.  hx of recurrent sinus infxns.  + nausea due to HA.  Current Medications (verified): 1)  Singulair 10 Mg Tabs (Montelukast Sodium) .Marland Kitchen.. 1 By Mouth Once Daily 2)  Veramyst 27.5 Mcg/spray Susp (Fluticasone Furoate) .... 2 Sprays Each Nostril Once Daily 3)  Alprazolam 0.5 Mg  Tabs (Alprazolam) .Marland Kitchen.. 1 Three Times A Day As Needed 4)  Proair Hfa 108 (90 Base) Mcg/act  Aers (Albuterol Sulfate) .... As Needed 5)  Xopenex 1.25 Mg/54ml  Nebu (Levalbuterol Hcl) .... Nebulizer As Needed 6)  Symbicort 160-4.5 Mcg/act Aero (Budesonide-Formoterol Fumarate) .... 2 Puffs Two Times A Day 7)  Kaon-Cl-10 10 Meq  Tbcr (Potassium Chloride) .Marland Kitchen.. 1 By Mouth Two Times A Day 8)  Zyrtec Allergy 10 Mg  Tabs (Cetirizine Hcl) .Marland Kitchen.. 1 By Mouth Once Daily 9)  Imitrex 100 Mg Tabs (Sumatriptan Succinate) .... As Directed 10)  Lisinopril-Hydrochlorothiazide 10-12.5 Mg Tabs (Lisinopril-Hydrochlorothiazide) .Marland Kitchen.. 1 By Mouth Once Daily 11)  Hydrochlorothiazide 25 Mg Tabs (Hydrochlorothiazide) .Marland Kitchen.. 1 By Mouth Once Daily  Allergies (verified): 1)  ! Pcn 2)  ! * Ivp Dye  Review of Systems      See HPI  Physical Exam  General:  alert, well-developed, well-nourished, and well-hydrated.   Head:  + TTP over frontal and maxillary sinuses Eyes:  no injxn or inflammation Ears:  External ear exam shows no significant lesions or deformities.   Otoscopic examination reveals clear canals, tympanic membranes are intact bilaterally without bulging, retraction, inflammation or discharge. Hearing is grossly normal bilaterally. Nose:  mild congestion Mouth:  Oral mucosa and oropharynx without lesions or exudates.  Teeth in good repair. Neck:  No deformities, masses, or tenderness noted. Lungs:  CTAB, normal respiratory effort Heart:  Normal rate and regular rhythm. S1 and S2 normal without gallop, murmur, click, rub or other extra sounds. Cervical Nodes:  No lymphadenopathy noted   Impression & Recommendations:  Problem # 1:  SINUSITIS, ACUTE NOS (ICD-461.9) Assessment Unchanged pt's sxs again consistent w/ sinusitis.  clarithromycin.  zofran as needed for nausea.  reviewed supportive care and red flags that should prompt return.  Pt expresses understanding and is in agreement w/ this plan. Her updated medication list for this problem includes:    Veramyst 27.5 Mcg/spray Susp (Fluticasone furoate) .Marland Kitchen... 2 sprays each nostril once daily    Clarithromycin 500 Mg Tabs (Clarithromycin) .Marland Kitchen... 1  by mouth 2 times daily x10 days  Complete Medication List: 1)  Singulair 10 Mg Tabs (Montelukast sodium) .Marland Kitchen.. 1 by mouth once daily 2)  Veramyst 27.5 Mcg/spray Susp (Fluticasone furoate) .... 2 sprays each nostril once daily 3)  Alprazolam 0.5 Mg Tabs (Alprazolam) .Marland Kitchen.. 1 three times a day as needed 4)  Proair Hfa 108 (90 Base) Mcg/act Aers (  Albuterol sulfate) .... As needed 5)  Xopenex 1.25 Mg/81ml Nebu (Levalbuterol hcl) .... Nebulizer as needed 6)  Symbicort 160-4.5 Mcg/act Aero (Budesonide-formoterol fumarate) .... 2 puffs two times a day 7)  Kaon-cl-10 10 Meq Tbcr (Potassium chloride) .Marland Kitchen.. 1 by mouth two times a day 8)  Zyrtec Allergy 10 Mg Tabs (Cetirizine hcl) .Marland Kitchen.. 1 by mouth once daily 9)  Imitrex 100 Mg Tabs (Sumatriptan succinate) .... As directed 10)  Lisinopril-hydrochlorothiazide 10-12.5 Mg Tabs (Lisinopril-hydrochlorothiazide) .Marland Kitchen.. 1 by  mouth once daily 11)  Hydrochlorothiazide 25 Mg Tabs (Hydrochlorothiazide) .Marland Kitchen.. 1 by mouth once daily 12)  Clarithromycin 500 Mg Tabs (Clarithromycin) .Marland Kitchen.. 1  by mouth 2 times daily x10 days 13)  Zofran 8 Mg Tabs (Ondansetron hcl) .Marland Kitchen.. 1 by mouth q 8h prn  Patient Instructions: 1)  Please schedule a follow-up appointment as needed. 2)  Take the Clarithromycin as directed- take w/ food to avoid upset stomach 3)  Use the Zofran as needed for nausea 4)  Drink plenty of fluid 5)  Tylenol/Ibuprofen as needed for pain or fever 6)  Hang in there! Prescriptions: ZOFRAN 8 MG  TABS (ONDANSETRON HCL) 1 by mouth q 8h prn  #24 x 0   Entered and Authorized by:   Neena Rhymes MD   Signed by:   Neena Rhymes MD on 11/09/2009   Method used:   Electronically to        [facility]* (retail)       [address]       [place], [place]  [address]       Ph: [phone]       Fax: [phone]   RxID:   [id number] CLARITHROMYCIN 500 MG  TABS (CLARITHROMYCIN) 1  by mouth 2 times daily x10 days  #20 x 0   Entered and Authorized by:   Neena Rhymes MD   Signed by:   Neena Rhymes MD on 11/09/2009   Method used:   Electronically to        [facility]* (retail)       [address]       [place], [place]  [address]       Ph: [phone]       Fax: [phone]   RxID:   [id number]

## 2010-04-18 NOTE — Assessment & Plan Note (Signed)
Summary: cough,cold, asthma//lh   Vital Signs:  Patient profile:   45 year old female Weight:      212 pounds O2 Sat:      96 % on Room air Temp:     98.3 degrees F oral Pulse rate:   92 / minute BP sitting:   120 / 82  (left arm) Cuff size:   regular  Vitals Entered By: Army Fossa CMA (April 16, 2009 11:35 AM)  O2 Flow:  Room air CC: Pt c/o chest congestion, head congestion, hurts in her ribs to cough. , Cough   History of Present Illness:  Cough      This is a 45 year old woman who presents with Cough.  The symptoms began 5 days ago.  The patient reports productive cough and wheezing, but denies non-productive cough, pleuritic chest pain, shortness of breath, exertional dyspnea, fever, hemoptysis, and malaise.  Associated symtpoms include nasal congestion.  The patient denies the following symptoms: cold/URI symptoms, sore throat, chronic rhinitis, weight loss, acid reflux symptoms, and peripheral edema.  The cough is worse with activity and lying down.  Ineffective prior treatments have included OTC cough medication, albuterol inhaler, and other asthma medication.  Risk factors include history of asthma.    Current Medications (verified): 1)  Wellbutrin Sr 150 Mg Tb12 (Bupropion Hcl) .Marland Kitchen.. 1 Tablet By Mouth Twice A Day 2)  Singulair 10 Mg Tabs (Montelukast Sodium) .Marland Kitchen.. 1 By Mouth Once Daily 3)  Celexa 20 Mg Tabs (Citalopram Hydrobromide) .Marland Kitchen.. 1 Tablet By Mouth Once A Day 4)  Veramyst 27.5 Mcg/spray Susp (Fluticasone Furoate) .... 2 Sprays Each Nostril Once Daily 5)  Alprazolam 0.5 Mg  Tabs (Alprazolam) .Marland Kitchen.. 1 Three Times A Day As Needed 6)  Proair Hfa 108 (90 Base) Mcg/act  Aers (Albuterol Sulfate) .... As Needed 7)  Xopenex 1.25 Mg/46ml  Nebu (Levalbuterol Hcl) .... Nebulizer As Needed 8)  Symbicort 160-4.5 Mcg/act Aero (Budesonide-Formoterol Fumarate) .... 2 Puffs Two Times A Day 9)  Kaon-Cl-10 10 Meq  Tbcr (Potassium Chloride) .Marland Kitchen.. 1 By Mouth Two Times A Day 10)  Zyrtec  Allergy 10 Mg  Tabs (Cetirizine Hcl) .Marland Kitchen.. 1 By Mouth Once Daily 11)  Imitrex 100 Mg Tabs (Sumatriptan Succinate) .... As Directed 12)  Lisinopril-Hydrochlorothiazide 10-12.5 Mg Tabs (Lisinopril-Hydrochlorothiazide) .Marland Kitchen.. 1 By Mouth Once Daily 13)  Hydrochlorothiazide 25 Mg Tabs (Hydrochlorothiazide) .Marland Kitchen.. 1 By Mouth Once Daily 14)  Tussionex Pennkinetic Er 8-10 Mg/32ml Lqcr (Chlorpheniramine-Hydrocodone) .Marland Kitchen.. 1 Tsp By Mouth At Bedtime 15)  Prednisone 10 Mg Tabs (Prednisone) .... 3 By Mouth Once Daily For 3 Days Then 2 By Mouth Once Daily For 3 Days Then 1 By Mouth Once Daily For 3 Days 16)  Avelox 400 Mg Tabs (Moxifloxacin Hcl) .Marland Kitchen.. 1 By Mouth Once Daily  Allergies: 1)  ! Pcn 2)  ! * Ivp Dye  Past History:  Past Medical History: Last updated: 02/08/2008 Asthma Anxiety MHA Hypertension  Family History: Last updated: 02/24/2008 Family History of Asthma Family History Diabetes 1st degree relative Family History High cholesterol Family History Hypertension throat CA--F  Social History: Last updated: 02/24/2008 Married Never Smoked Drug use-no Regular exercise-no  Risk Factors: Exercise: no (02/24/2008)  Risk Factors: Smoking Status: never (02/24/2008)  Family History: Reviewed history from 02/24/2008 and no changes required. Family History of Asthma Family History Diabetes 1st degree relative Family History High cholesterol Family History Hypertension throat CA--F  Social History: Reviewed history from 02/24/2008 and no changes required. Married Never Smoked Drug use-no Regular  exercise-no  Review of Systems      See HPI  Physical Exam  General:  Well-developed,well-nourished,in no acute distress; alert,appropriate and cooperative throughout examination Ears:  External ear exam shows no significant lesions or deformities.  Otoscopic examination reveals clear canals, tympanic membranes are intact bilaterally without bulging, retraction, inflammation or  discharge. Hearing is grossly normal bilaterally. Nose:  External nasal examination shows no deformity or inflammation. Nasal mucosa are pink and moist without lesions or exudates. Mouth:  Oral mucosa and oropharynx without lesions or exudates.  Teeth in good repair. Neck:  No deformities, masses, or tenderness noted. Lungs:  R decreased breath sounds and L decreased breath sounds.   + wheezing with cough Heart:  normal rate and no murmur.   Psych:  Oriented X3 and normally interactive.     Impression & Recommendations:  Problem # 1:  ASTHMATIC BRONCHITIS, ACUTE (ICD-466.0)  Her updated medication list for this problem includes:    Singulair 10 Mg Tabs (Montelukast sodium) .Marland Kitchen... 1 by mouth once daily    Proair Hfa 108 (90 Base) Mcg/act Aers (Albuterol sulfate) .Marland Kitchen... As needed    Xopenex 1.25 Mg/16ml Nebu (Levalbuterol hcl) ..... Nebulizer as needed    Symbicort 160-4.5 Mcg/act Aero (Budesonide-formoterol fumarate) .Marland Kitchen... 2 puffs two times a day    Tussionex Pennkinetic Er 8-10 Mg/49ml Lqcr (Chlorpheniramine-hydrocodone) .Marland Kitchen... 1 tsp by mouth at bedtime    Avelox 400 Mg Tabs (Moxifloxacin hcl) .Marland Kitchen... 1 by mouth once daily  Take antibiotics and other medications as directed. Encouraged to push clear liquids, get enough rest, and take acetaminophen as needed. To be seen in 5-7 days if no improvement, sooner if worse.  Orders: Nebulizer Tx (82956)  Complete Medication List: 1)  Wellbutrin Sr 150 Mg Tb12 (Bupropion hcl) .Marland Kitchen.. 1 tablet by mouth twice a day 2)  Singulair 10 Mg Tabs (Montelukast sodium) .Marland Kitchen.. 1 by mouth once daily 3)  Celexa 20 Mg Tabs (Citalopram hydrobromide) .Marland Kitchen.. 1 tablet by mouth once a day 4)  Veramyst 27.5 Mcg/spray Susp (Fluticasone furoate) .... 2 sprays each nostril once daily 5)  Alprazolam 0.5 Mg Tabs (Alprazolam) .Marland Kitchen.. 1 three times a day as needed 6)  Proair Hfa 108 (90 Base) Mcg/act Aers (Albuterol sulfate) .... As needed 7)  Xopenex 1.25 Mg/16ml Nebu (Levalbuterol hcl)  .... Nebulizer as needed 8)  Symbicort 160-4.5 Mcg/act Aero (Budesonide-formoterol fumarate) .... 2 puffs two times a day 9)  Kaon-cl-10 10 Meq Tbcr (Potassium chloride) .Marland Kitchen.. 1 by mouth two times a day 10)  Zyrtec Allergy 10 Mg Tabs (Cetirizine hcl) .Marland Kitchen.. 1 by mouth once daily 11)  Imitrex 100 Mg Tabs (Sumatriptan succinate) .... As directed 12)  Lisinopril-hydrochlorothiazide 10-12.5 Mg Tabs (Lisinopril-hydrochlorothiazide) .Marland Kitchen.. 1 by mouth once daily 13)  Hydrochlorothiazide 25 Mg Tabs (Hydrochlorothiazide) .Marland Kitchen.. 1 by mouth once daily 14)  Tussionex Pennkinetic Er 8-10 Mg/23ml Lqcr (Chlorpheniramine-hydrocodone) .Marland Kitchen.. 1 tsp by mouth at bedtime 15)  Prednisone 10 Mg Tabs (Prednisone) .... 3 by mouth once daily for 3 days then 2 by mouth once daily for 3 days then 1 by mouth once daily for 3 days 16)  Avelox 400 Mg Tabs (Moxifloxacin hcl) .Marland Kitchen.. 1 by mouth once daily  Other Orders: Albuterol Sulfate Sol 1mg  unit dose (O1308) Prescriptions: AVELOX 400 MG TABS (MOXIFLOXACIN HCL) 1 by mouth once daily  #10 x 0   Entered and Authorized by:   Loreen Freud DO   Signed by:   Loreen Freud DO on 04/16/2009   Method used:  Electronically to        [facility]* (retail)       [address]       [place], [place]  [address]       Ph: [phone]       Fax: [phone]   RxID:   [id number] PREDNISONE 10 MG TABS (PREDNISONE) 3 by mouth once daily for 3 days then 2 by mouth once daily for 3 days then 1 by mouth once daily for 3 days  #18 x 0   Entered and Authorized by:   Loreen Freud DO   Signed by:   Loreen Freud DO on 04/16/2009   Method used:   Electronically to        [facility]* (retail)       [address]       [place], [place]  [address]       Ph: [phone]       Fax: [phone]   RxID:   [id number] AVELOX 400 MG TABS (MOXIFLOXACIN HCL) 1 by mouth once daily  #10 x 0   Entered and Authorized by:   Loreen Freud DO   Signed by:   Loreen Freud DO on 04/16/2009    Method used:   Electronically to        [facility] (retail)       [address]       [place], [place]  [address]       Ph: [phone]       Fax: [phone]   RxID:   [id number] PREDNISONE 10 MG TABS (PREDNISONE) 3 by mouth once daily for 3 days then 2 by mouth once daily for 3 days then 1 by mouth once daily for 3 days  #18 x 0   Entered and Authorized by:   Loreen Freud DO   Signed by:   Loreen Freud DO on 04/16/2009   Method used:   Electronically to        [facility] (retail)       [phone]       [place], [place]  [id number]       Ph: [phone]       Fax: [phone]   RxID:   [id number]    Medication Administration  Medication # 1:    Medication: Albuterol Sulfate Sol 1mg  unit dose    Diagnosis: ASTHMA (ICD-493.90)    Route: inhaled    Exp Date: 05/16/2010    Lot #: [id number]    Mfr: nephron    Comments: patient given 2.5 mg /3ML    Patient tolerated medication without complications    Given by: Jeremy Johann CMA (April 16, 2009 12:10 PM)  Orders Added: 1)  Est. Patient Level IV [43154] 2)  Nebulizer Tx [00867] 3)  Albuterol Sulfate Sol 1mg  unit dose [Y1950]   Appended Document: cough,cold, asthma//lh     Clinical Lists Changes  Orders: Added new Service order of Depo- Medrol 80mg  (J1040) - Signed Added new Service order of Admin of Therapeutic Inj  intramuscular or subcutaneous (93267) - Signed       Medication Administration  Injection # 1:    Medication: Depo- Medrol 80mg     Diagnosis: ASTHMATIC BRONCHITIS, ACUTE (ICD-466.0)    Route: IM    Site: LUOQ gluteus    Exp Date: 12/2009    Lot #:  [id number]    Mfr: novaplus    Patient tolerated injection without complications    Given by: Army Fossa CMA (April 16, 2009 12:20 PM)  Orders Added: 1)  Depo- Medrol 80mg  [J1040] 2)  Admin of Therapeutic Inj  intramuscular or subcutaneous [[address]]

## 2010-04-18 NOTE — Progress Notes (Signed)
Summary: Refill Request  Phone Note Refill Request Call back at [phone] Message from:  Pharmacy on September 04, 2009 1:19 PM  Refills Requested: Medication #1:  ALPRAZOLAM 0.5 MG  TABS 1 three times a day as needed   Dosage confirmed as above?Dosage Confirmed   Supply Requested: 1 month   Last Refilled: 06/26/2009 [facility]  Next Appointment Scheduled: none Initial call taken by: Harold Barban,  September 04, 2009 1:20 PM  Follow-up for Phone Call        last ov 04/16/09. Army Fossa CMA  September 04, 2009 1:27 PM   Additional Follow-up for Phone Call Additional follow up Details #1::        ok to refill x1  Additional Follow-up by: Loreen Freud DO,  September 04, 2009 1:32 PM    Prescriptions: ALPRAZOLAM 0.5 MG  TABS (ALPRAZOLAM) 1 three times a day as needed  #90 x 0   Entered by:   Army Fossa CMA   Authorized by:   Loreen Freud DO   Signed by:   Army Fossa CMA on 09/04/2009   Method used:   Printed then faxed to ...       [facility]* (retail)       [address]       [place], [place]  [address]       Ph: [phone]       Fax: [phone]   RxID:   [id number]

## 2010-04-18 NOTE — Assessment & Plan Note (Signed)
Summary: sinus infection//lch   Vital Signs:  Patient profile:   45 year old female Weight:      207 pounds BMI:     41.96 Temp:     97.8 degrees F oral BP sitting:   118 / 72  (left arm)  Vitals Entered By: Doristine Devoid CMA (March 21, 2010 9:28 AM) CC: sinus HA and pressure along w/ cough   History of Present Illness: 45 yo woman here today for ? sinus infxn.  sxs started 1 week ago w/ mild dizziness that progressed to vertigo.  now having facial pain and pressure, R>L.  + nasal congestion, ear pain.  cough is dry and hacking.  using nasonex.  no fever.  + sick contacts.  Current Medications (verified): 1)  Singulair 10 Mg Tabs (Montelukast Sodium) .Marland Kitchen.. 1 By Mouth Once Daily 2)  Nasonex 50 Mcg/act Susp (Mometasone Furoate) .... 2 Sprays Each Nostril Once Daily 3)  Alprazolam 0.5 Mg  Tabs (Alprazolam) .Marland Kitchen.. 1 Three Times A Day As Needed 4)  Proair Hfa 108 (90 Base) Mcg/act  Aers (Albuterol Sulfate) .... As Needed 5)  Xopenex 1.25 Mg/62ml  Nebu (Levalbuterol Hcl) .... Nebulizer As Needed 6)  Dulera 100-5 Mcg/act Aero (Mometasone Furo-Formoterol Fum) .... 2 Puffs Two Times A Day 7)  Kaon-Cl-10 10 Meq  Tbcr (Potassium Chloride) .Marland Kitchen.. 1 By Mouth Two Times A Day.... 8)  Zyrtec Allergy 10 Mg  Tabs (Cetirizine Hcl) .Marland Kitchen.. 1 By Mouth Once Daily 9)  Imitrex 100 Mg Tabs (Sumatriptan Succinate) .... As Directed 10)  Lisinopril-Hydrochlorothiazide 10-12.5 Mg Tabs (Lisinopril-Hydrochlorothiazide) .Marland Kitchen.. 1 By Mouth Once Daily 11)  Hydrochlorothiazide 25 Mg Tabs (Hydrochlorothiazide) .Marland Kitchen.. 1 By Mouth Once Daily 12)  Zofran 8 Mg  Tabs (Ondansetron Hcl) .Marland Kitchen.. 1 By Mouth Q 8h Prn 13)  Lotrisone 1-0.05 % Crea (Clotrimazole-Betamethasone) .... Apply Two Times A Day 14)  Fluconazole 150 Mg Tabs (Fluconazole) .Marland Kitchen.. 1 By Mouth Once Daily X1, May Repeat in 3 Days As Needed  Allergies (verified): 1)  ! Pcn 2)  ! * Ivp Dye  Review of Systems      See HPI  Physical Exam  General:   Well-developed,well-nourished,in no acute distress; alert,appropriate and cooperative throughout examination Head:  + TTP over frontal and maxillary sinuses Eyes:  no injxn or inflammation Ears:  External ear exam shows no significant lesions or deformities.  Otoscopic examination reveals clear canals, tympanic membranes are intact bilaterally without bulging, retraction, inflammation or discharge. Hearing is grossly normal bilaterally. Nose:  External nasal examination shows no deformity or inflammation. Nasal mucosa are pink and moist without lesions or exudates. Mouth:  Oral mucosa and oropharynx without lesions or exudates.  Teeth in good repair. Neck:  No deformities, masses, or tenderness noted. Lungs:  Normal respiratory effort, chest expands symmetrically. Lungs are clear to auscultation, no crackles or wheezes.  no cough heard Heart:  Normal rate and regular rhythm. S1 and S2 normal without gallop, murmur, click, rub or other extra sounds.   Impression & Recommendations:  Problem # 1:  SINUSITIS, ACUTE NOS (ICD-461.9) Assessment Unchanged sxs and PE consistent w/ infxn.  start abx.  reviewed supportive care and red flags that should prompt return.  Pt expresses understanding and is in agreement w/ this plan. Her updated medication list for this problem includes:    Nasonex 50 Mcg/act Susp (Mometasone furoate) .Marland Kitchen... 2 sprays each nostril once daily    Clarithromycin 500 Mg Xr24h-tab (Clarithromycin) .Marland Kitchen... 2 tabs by mouth daily x10 days  Tessalon 200 Mg Caps (Benzonatate) .Marland Kitchen... Take one capsule by mouth three times a day as needed for cough  Complete Medication List: 1)  Singulair 10 Mg Tabs (Montelukast sodium) .Marland Kitchen.. 1 by mouth once daily 2)  Nasonex 50 Mcg/act Susp (Mometasone furoate) .... 2 sprays each nostril once daily 3)  Alprazolam 0.5 Mg Tabs (Alprazolam) .Marland Kitchen.. 1 three times a day as needed 4)  Proair Hfa 108 (90 Base) Mcg/act Aers (Albuterol sulfate) .... As needed 5)   Xopenex 1.25 Mg/63ml Nebu (Levalbuterol hcl) .... Nebulizer as needed 6)  Dulera 100-5 Mcg/act Aero (Mometasone furo-formoterol fum) .... 2 puffs two times a day 7)  Kaon-cl-10 10 Meq Tbcr (Potassium chloride) .Marland Kitchen.. 1 by mouth two times a day.... 8)  Zyrtec Allergy 10 Mg Tabs (Cetirizine hcl) .Marland Kitchen.. 1 by mouth once daily 9)  Imitrex 100 Mg Tabs (Sumatriptan succinate) .... As directed 10)  Lisinopril-hydrochlorothiazide 10-12.5 Mg Tabs (Lisinopril-hydrochlorothiazide) .Marland Kitchen.. 1 by mouth once daily 11)  Hydrochlorothiazide 25 Mg Tabs (Hydrochlorothiazide) .Marland Kitchen.. 1 by mouth once daily 12)  Zofran 8 Mg Tabs (Ondansetron hcl) .Marland Kitchen.. 1 by mouth q 8h prn 13)  Lotrisone 1-0.05 % Crea (Clotrimazole-betamethasone) .... Apply two times a day 14)  Fluconazole 150 Mg Tabs (Fluconazole) .Marland Kitchen.. 1 by mouth once daily x1, may repeat in 3 days as needed 15)  Clarithromycin 500 Mg Xr24h-tab (Clarithromycin) .... 2 tabs by mouth daily x10 days 16)  Tessalon 200 Mg Caps (Benzonatate) .... Take one capsule by mouth three times a day as needed for cough 17)  Meclizine Hcl 25 Mg Tabs (Meclizine hcl) .Marland Kitchen.. 1 by mouth q6 as needed vertigo  Patient Instructions: 1)  You have a sinus infection and vertigo 2)  The vertigo will improve once the pressure in your sinuses is less 3)  Take the Clarithromycin as directed- take w/ food to avoid upset stomach 4)  Use the cough pills as needed 5)  Add Mucinex to break up your congestion 6)  Use the Meclizine as needed for your vertigo/dizziness 7)  Drink plenty of fluids 8)  Rest! 9)  Hang in there!!! Prescriptions: MECLIZINE HCL 25 MG TABS (MECLIZINE HCL) 1 by mouth q6 as needed vertigo  #45 x 0   Entered and Authorized by:   Neena Rhymes MD   Signed by:   Neena Rhymes MD on 03/21/2010   Method used:   Electronically to        [facility]* (retail)       [address]       [place], [place]  [address]       Ph: [phone]       Fax: [phone]   RxID:    [id number] TESSALON 200 MG CAPS (BENZONATATE) Take one capsule by mouth three times a day as needed for cough  #60 x 0   Entered and Authorized by:   Neena Rhymes MD   Signed by:   Neena Rhymes MD on 03/21/2010   Method used:   Electronically to        [facility]* (retail)       [address]       [place], [place]  [address]       Ph: [phone]       Fax: [phone]   RxID:   [id number] CLARITHROMYCIN 500 MG XR24H-TAB (CLARITHROMYCIN) 2 tabs by mouth daily x10 days  #20 x 0   Entered and Authorized by:   Neena Rhymes MD   Signed by:  Neena Rhymes MD on 03/21/2010   Method used:   Electronically to        [facility]* (retail)       [address]       [place], [place]  [address]       Ph: [phone]       Fax: [phone]   RxID:   [id number]    Orders Added: 1)  Est. Patient Level III [[address]]

## 2010-04-18 NOTE — Letter (Signed)
Summary: Medical Questionnaire & Accomodation Form/[facility]  Medical Questionnaire & Accomodation Form/[facility]   Imported By: Lanelle Bal 10/22/2009 11:38:16  _____________________________________________________________________  External Attachment:    Type:   Image     Comment:   External Document

## 2010-04-18 NOTE — Progress Notes (Signed)
Summary: Refill  Phone Note Refill Request Message from:  Fax from Pharmacy on [facility]  Refills Requested: Medication #1:  IMITREX 100 MG TABS as directed   Last Refilled: 05/23/2008 last ov-04/16/2009  Initial call taken by: Army Fossa CMA,  May 10, 2009 8:10 AM  Follow-up for Phone Call        ok x1 Follow-up by: Loreen Freud DO,  May 10, 2009 11:24 AM    Prescriptions: IMITREX 100 MG TABS (SUMATRIPTAN SUCCINATE) as directed  #30 x 0   Entered by:   Army Fossa CMA   Authorized by:   Loreen Freud DO   Signed by:   Army Fossa CMA on 05/10/2009   Method used:   Electronically to        [facility] (mail-order)             ,          Ph: [phone]       Fax: [phone]   RxID:   [id number]

## 2010-04-18 NOTE — Progress Notes (Signed)
Summary: ondansetron refill  Phone Note Refill Request Message from:  Fax from Pharmacy on January 10, 2010 12:22 PM  Refills Requested: Medication #1:  ZOFRAN 8 MG  TABS 1 by mouth q 8h prn. [facility], [address], [place], [place]    fax [phone]    (see refill note dated 10/19)  Initial call taken by: Jerolyn Shin,  January 10, 2010 12:23 PM    Prescriptions: ZOFRAN 8 MG  TABS (ONDANSETRON HCL) 1 by mouth q 8h prn  #24 x 0   Entered by:   Almeta Monas CMA (AAMA)   Authorized by:   Loreen Freud DO   Signed by:   Almeta Monas CMA (AAMA) on 01/10/2010   Method used:   Electronically to        [facility]* (retail)       [address]       [place], [place]  [address]       Ph: [phone]       Fax: [phone]   RxID:   [id number] IMITREX 100 MG TABS (SUMATRIPTAN SUCCINATE) as directed  #15 x 0   Entered by:   Almeta Monas CMA (AAMA)   Authorized by:   Loreen Freud DO   Signed by:   Almeta Monas CMA (AAMA) on 01/10/2010   Method used:   Electronically to        [facility]* (retail)       [address]       [place], [place]  [address]       Ph: [phone]       Fax: [phone]   RxID:   [id number]

## 2010-04-18 NOTE — Progress Notes (Signed)
Summary: repeat lab- (lmom 2/10)  ---- Converted from flag ---- ---- 04/25/2009 12:09 PM, Barb Merino wrote: patient stated that Dr. Laury Axon told her to  recheck it before her refills run out she states she is almost out. Please advise  [phone] ------------------------------ dr Laury Axon pls advise when pt is suppose to get potassium recheck.Felecia Deloach CMA  April 26, 2009 2:25 PM  Phone Note Outgoing Call   Summary of Call: left message for pt-- she just need her potassium checked- we will refill meds if she cannot get in before she runs out.Army Fossa CMA  April 26, 2009 3:39 PM   Follow-up for Phone Call        [facility] scheduled appt. Army Fossa CMA  April 27, 2009 8:12 AM   New Problems: HYPOKALEMIA (ICD-276.8)   New Problems: HYPOKALEMIA (ICD-276.8)

## 2010-04-18 NOTE — Progress Notes (Signed)
Summary: patient wants sample symbicort  Phone Note Call from Patient   Summary of Call: patient wants sample of symbicort Initial call taken by: Okey Regal Spring,  September 06, 2009 8:56 AM  Follow-up for Phone Call        gave pt 1 sample of symbicort. Army Fossa CMA  September 06, 2009 10:02 AM

## 2010-04-18 NOTE — Progress Notes (Signed)
Summary: samples   Phone Note Outgoing Call   Summary of Call: Pt called and stated she lost her symbicort and she cannot get it filled for another 2 weeks. I told pt I would place 1 sample upfront for her to pick up. Army Fossa CMA  May 08, 2009 2:27 PM

## 2010-04-18 NOTE — Assessment & Plan Note (Signed)
Summary: POSSIBLE BRONCHITIS & YEAST INFECTION/KN   Vital Signs:  Patient profile:   45 year old female Height:      59 inches Weight:      212.2 pounds BMI:     43.01 O2 Sat:      98 % on Room air Temp:     98.1 degrees F oral Pulse rate:   93 / minute Pulse rhythm:   regular BP sitting:   120 / 80  (left arm) Cuff size:   regular  Vitals Entered By: Almeta Monas CMA Duncan Dull) (February 05, 2010 2:05 PM)  O2 Flow:  Room air CC: x1wk c/o itching to the groin area, cough, dizziness, headache and frequent urination, Cough   History of Present Illness:  Cough      This is a 45 year old woman who presents with Cough.  The patient reports productive cough, shortness of breath, and wheezing, but denies non-productive cough, pleuritic chest pain, exertional dyspnea, fever, hemoptysis, and malaise.  Associated symtpoms include cold/URI symptoms.  The patient denies the following symptoms: sore throat, nasal congestion, chronic rhinitis, weight loss, acid reflux symptoms, and peripheral edema.  The cough is worse with activity.  Ineffective prior treatments have included OTC cough medication and albuterol inhaler.  Risk factors include history of asthma.    Pt also c/o itchy rash in groin area.  Pt has used otc creams with no relief.  Current Medications (verified): 1)  Singulair 10 Mg Tabs (Montelukast Sodium) .Marland Kitchen.. 1 By Mouth Once Daily 2)  Nasonex 50 Mcg/act Susp (Mometasone Furoate) .... 2 Sprays Each Nostril Once Daily 3)  Alprazolam 0.5 Mg  Tabs (Alprazolam) .Marland Kitchen.. 1 Three Times A Day As Needed 4)  Proair Hfa 108 (90 Base) Mcg/act  Aers (Albuterol Sulfate) .... As Needed 5)  Xopenex 1.25 Mg/40ml  Nebu (Levalbuterol Hcl) .... Nebulizer As Needed 6)  Dulera 100-5 Mcg/act Aero (Mometasone Furo-Formoterol Fum) .... 2 Puffs Two Times A Day 7)  Kaon-Cl-10 10 Meq  Tbcr (Potassium Chloride) .Marland Kitchen.. 1 By Mouth Two Times A Day 8)  Zyrtec Allergy 10 Mg  Tabs (Cetirizine Hcl) .Marland Kitchen.. 1 By Mouth Once  Daily 9)  Imitrex 100 Mg Tabs (Sumatriptan Succinate) .... As Directed 10)  Lisinopril-Hydrochlorothiazide 10-12.5 Mg Tabs (Lisinopril-Hydrochlorothiazide) .Marland Kitchen.. 1 By Mouth Once Daily 11)  Hydrochlorothiazide 25 Mg Tabs (Hydrochlorothiazide) .Marland Kitchen.. 1 By Mouth Once Daily 12)  Zofran 8 Mg  Tabs (Ondansetron Hcl) .Marland Kitchen.. 1 By Mouth Q 8h Prn 13)  Biaxin Xl 500 Mg Xr24h-Tab (Clarithromycin) .... 2 By Mouth Once Daily 14)  Lotrisone 1-0.05 % Crea (Clotrimazole-Betamethasone) .... Apply Two Times A Day 15)  Fluconazole 150 Mg Tabs (Fluconazole) .Marland Kitchen.. 1 By Mouth Once Daily X1, May Repeat in 3 Days As Needed  Allergies (verified): 1)  ! Pcn 2)  ! * Ivp Dye  Past History:  Past Medical History: Last updated: 02/08/2008 Asthma Anxiety MHA Hypertension  Family History: Last updated: 02/24/2008 Family History of Asthma Family History Diabetes 1st degree relative Family History High cholesterol Family History Hypertension throat CA--F  Social History: Last updated: 02/24/2008 Married Never Smoked Drug use-no Regular exercise-no  Risk Factors: Exercise: no (02/24/2008)  Risk Factors: Smoking Status: never (02/24/2008)  Family History: Reviewed history from 02/24/2008 and no changes required. Family History of Asthma Family History Diabetes 1st degree relative Family History High cholesterol Family History Hypertension throat CA--F  Social History: Reviewed history from 02/24/2008 and no changes required. Married Never Smoked Drug use-no Regular exercise-no  Review  of Systems      See HPI  Physical Exam  General:  Well-developed,well-nourished,in no acute distress; alert,appropriate and cooperative throughout examination Nose:  External nasal examination shows no deformity or inflammation. Nasal mucosa are pink and moist without lesions or exudates. Mouth:  Oral mucosa and oropharynx without lesions or exudates.  Teeth in good repair. Neck:  No deformities, masses, or  tenderness noted. Lungs:  R wheezes and L wheezes.   Heart:  Normal rate and regular rhythm. S1 and S2 normal without gallop, murmur, click, rub or other extra sounds. Skin:  + errythematous rash in groin --c/w candida Psych:  Cognition and judgment appear intact. Alert and cooperative with normal attention span and concentration. No apparent delusions, illusions, hallucinations   Impression & Recommendations:  Problem # 1:  BRONCHITIS- ACUTE (ICD-466.0)  Her updated medication list for this problem includes:    Singulair 10 Mg Tabs (Montelukast sodium) .Marland Kitchen... 1 by mouth once daily    Proair Hfa 108 (90 Base) Mcg/act Aers (Albuterol sulfate) .Marland Kitchen... As needed    Xopenex 1.25 Mg/50ml Nebu (Levalbuterol hcl) ..... Nebulizer as needed    Dulera 100-5 Mcg/act Aero (Mometasone furo-formoterol fum) .Marland Kitchen... 2 puffs two times a day    Biaxin Xl 500 Mg Xr24h-tab (Clarithromycin) .Marland Kitchen... 2 by mouth once daily  Take antibiotics and other medications as directed. Encouraged to push clear liquids, get enough rest, and take acetaminophen as needed. To be seen in 5-7 days if no improvement, sooner if worse.  Problem # 2:  TINEA CRURIS (ICD-110.3) lotrisone for up to 2 weeks  Complete Medication List: 1)  Singulair 10 Mg Tabs (Montelukast sodium) .Marland Kitchen.. 1 by mouth once daily 2)  Nasonex 50 Mcg/act Susp (Mometasone furoate) .... 2 sprays each nostril once daily 3)  Alprazolam 0.5 Mg Tabs (Alprazolam) .Marland Kitchen.. 1 three times a day as needed 4)  Proair Hfa 108 (90 Base) Mcg/act Aers (Albuterol sulfate) .... As needed 5)  Xopenex 1.25 Mg/108ml Nebu (Levalbuterol hcl) .... Nebulizer as needed 6)  Dulera 100-5 Mcg/act Aero (Mometasone furo-formoterol fum) .... 2 puffs two times a day 7)  Kaon-cl-10 10 Meq Tbcr (Potassium chloride) .Marland Kitchen.. 1 by mouth two times a day 8)  Zyrtec Allergy 10 Mg Tabs (Cetirizine hcl) .Marland Kitchen.. 1 by mouth once daily 9)  Imitrex 100 Mg Tabs (Sumatriptan succinate) .... As directed 10)   Lisinopril-hydrochlorothiazide 10-12.5 Mg Tabs (Lisinopril-hydrochlorothiazide) .Marland Kitchen.. 1 by mouth once daily 11)  Hydrochlorothiazide 25 Mg Tabs (Hydrochlorothiazide) .Marland Kitchen.. 1 by mouth once daily 12)  Zofran 8 Mg Tabs (Ondansetron hcl) .Marland Kitchen.. 1 by mouth q 8h prn 13)  Biaxin Xl 500 Mg Xr24h-tab (Clarithromycin) .... 2 by mouth once daily 14)  Lotrisone 1-0.05 % Crea (Clotrimazole-betamethasone) .... Apply two times a day 15)  Fluconazole 150 Mg Tabs (Fluconazole) .Marland Kitchen.. 1 by mouth once daily x1, may repeat in 3 days as needed  Other Orders: Venipuncture (81[address]) TLB-BMP (Basic Metabolic Panel-BMET) (80048-METABOL) Specimen Handling (47829) Prescriptions: FLUCONAZOLE 150 MG TABS (FLUCONAZOLE) 1 by mouth once daily x1, may repeat in 3 days as needed  #2 x 2   Entered and Authorized by:   Loreen Freud DO   Signed by:   Loreen Freud DO on 02/05/2010   Method used:   Electronically to        [facility]* (retail)       [address]       [place], [place]  [address]       Ph: [phone]  Fax: [phone]   RxID:   [id number] LOTRISONE 1-0.05 % CREA (CLOTRIMAZOLE-BETAMETHASONE) apply two times a day  #30g x 0   Entered and Authorized by:   Loreen Freud DO   Signed by:   Loreen Freud DO on 02/05/2010   Method used:   Electronically to        [facility]* (retail)       [address]       [place], [place]  [address]       Ph: [phone]       Fax: [phone]   RxID:   [id number] BIAXIN XL 500 MG XR24H-TAB (CLARITHROMYCIN) 2 by mouth once daily  #28 x 0   Entered and Authorized by:   Loreen Freud DO   Signed by:   Loreen Freud DO on 02/05/2010   Method used:   Electronically to        [facility]* (retail)       [address]       [place], [place]  [address]       Ph: [phone]       Fax: [phone]   RxID:   [id number] NASONEX 50 MCG/ACT SUSP (MOMETASONE FUROATE) 2 sprays each nostril once daily  #1 x 0   Entered and Authorized by:    Loreen Freud DO   Signed by:   Loreen Freud DO on 02/05/2010   Method used:   Historical   RxID:   [id number] DULERA 100-5 MCG/ACT AERO (MOMETASONE FURO-FORMOTEROL FUM) 2 puffs two times a day  #1 x 5   Entered and Authorized by:   Loreen Freud DO   Signed by:   Loreen Freud DO on 02/05/2010   Method used:   Print then Give to Patient   RxID:   [id number]    Orders Added: 1)  Venipuncture [85462] 2)  TLB-BMP (Basic Metabolic Panel-BMET) [80048-METABOL] 3)  Specimen Handling [99000] 4)  Est. Patient Level III [70350]    Laboratory Results   Urine Tests    Routine Urinalysis   Glucose: negative   (Normal Range: Negative) Bilirubin: negative   (Normal Range: Negative) Ketone: negative   (Normal Range: Negative) Spec. Gravity: <1.005   (Normal Range: 1.003-1.035) Blood: negative   (Normal Range: Negative) pH: 6.0   (Normal Range: 5.0-8.0) Protein: negative   (Normal Range: Negative) Urobilinogen: 0.2   (Normal Range: 0-1) Nitrite: negative   (Normal Range: Negative) Leukocyte Esterace: negative   (Normal Range: Negative)

## 2010-04-18 NOTE — Progress Notes (Signed)
Summary: Refill Request  Phone Note Refill Request Message from:  Pharmacy on [facility] Fax #: [phone]  Refills Requested: Medication #1:  KAON-CL-10 10 MEQ  TBCR 1 by mouth two times a day   Dosage confirmed as above?Dosage Confirmed Initial call taken by: Harold Barban,  May 01, 2009 11:48 AM  Follow-up for Phone Call        done. Army Fossa CMA  May 01, 2009 11:54 AM

## 2010-04-18 NOTE — Progress Notes (Signed)
Summary: ALPRAZOLAM REFILL  Phone Note Refill Request Message from:  Fax from Pharmacy on January 14, 2010 1:53 PM  Refills Requested: Medication #1:  ALPRAZOLAM 0.5 MG  TABS 1 three times a day as needed [facility]    [address], [place]    FAX [phone]  Initial call taken by: Jerolyn Shin,  January 14, 2010 1:53 PM  Follow-up for Phone Call        last seen 11/22/09 last filled 11/16/09... Please advise Follow-up by: Almeta Monas CMA Duncan Dull),  January 14, 2010 3:56 PM  Additional Follow-up for Phone Call Additional follow up Details #1::        refill x1 Additional Follow-up by: Loreen Freud DO,  January 14, 2010 4:04 PM    Additional Follow-up for Phone Call Additional follow up Details #2::    Faxed to [facility] [phone] Follow-up by: Almeta Monas CMA Duncan Dull),  January 14, 2010 4:58 PM  Prescriptions: ALPRAZOLAM 0.5 MG  TABS (ALPRAZOLAM) 1 three times a day as needed  #90 x 0   Entered by:   Almeta Monas CMA (AAMA)   Authorized by:   Loreen Freud DO   Signed by:   Almeta Monas CMA (AAMA) on 01/14/2010   Method used:   Printed then faxed to ...       [facility] (retail)       [address]       [place], [place]  [address]       Ph: [phone]       Fax: [phone]   RxID:   [id number]

## 2010-04-18 NOTE — Progress Notes (Signed)
Summary: NEED SAMPLES OF SINGULAR AND SYMBICORT  Phone Note Call from Patient Call back at Work Phone [phone]   Caller: Patient Summary of Call: SHE SAYS DR LOWNE SAID TO CALL WHEN SHE NEEDS SAMPLES OF SINGULAR AND SYMBICORT UNTIL SHE GETS PAID NEXT WEEK--PLEASE CALL HER TO ADVISE IF SHE CAN PICK UP SAMPLES Initial call taken by: Jerolyn Shin,  December 04, 2009 9:22 AM  Follow-up for Phone Call        Left message to call back Almeta Monas CMA Duncan Dull)  December 04, 2009 12:12 PM   Additional Follow-up for Phone Call Additional follow up Details #1::        Samples left up front for Symbicort. singulair not available and pt is aware. Additional Follow-up by: Almeta Monas CMA Duncan Dull),  December 04, 2009 12:51 PM

## 2010-04-18 NOTE — Progress Notes (Signed)
Summary: labs-lmom  Phone Note Outgoing Call   Call placed by: Doristine Devoid,  March 26, 2009 2:26 PM Call placed to: Patient Summary of Call: increase K to 2 a day  Follow-up for Phone Call        left message on machine .........Marland KitchenDoristine Devoid  March 26, 2009 2:26 PM   Additional Follow-up for Phone Call Additional follow up Details #1::        pt is aware. Army Fossa CMA  March 26, 2009 2:36 PM    New/Updated Medications: KAON-CL-10 10 MEQ  TBCR (POTASSIUM CHLORIDE) 1 by mouth two times a day Prescriptions: KAON-CL-10 10 MEQ  TBCR (POTASSIUM CHLORIDE) 1 by mouth two times a day  #60 x 0   Entered by:   Army Fossa CMA   Authorized by:   Loreen Freud DO   Signed by:   Army Fossa CMA on 03/26/2009   Method used:   Faxed to ...       [facility] (retail)       [address]       [place], [place]  [address]  [place]       Ph: [phone]       Fax: [phone]   RxID:   [id number]

## 2010-04-18 NOTE — Assessment & Plan Note (Signed)
Summary: asthma/kdc   Vital Signs:  Patient profile:   45 year old female Height:      59 inches Weight:      214 pounds BMI:     43.38 Temp:     98.2 degrees F oral Pulse rate:   96 / minute Pulse rhythm:   regular BP sitting:   110 / 80  (left arm) Cuff size:   regular  Vitals Entered By: Army Fossa CMA (March 21, 2009 10:51 AM) CC: Pt c/o coughing, head congestion x 6 weeks. , Cough   History of Present Illness:  Cough      This is a 45 year old woman who presents with Cough.  The symptoms began 2 months ago.  Pt ran out of asthma meds.  The patient reports productive cough and wheezing, but denies non-productive cough, pleuritic chest pain, shortness of breath, exertional dyspnea, fever, hemoptysis, and malaise.  The patient denies the following symptoms: cold/URI symptoms, sore throat, nasal congestion, chronic rhinitis, weight loss, acid reflux symptoms, and peripheral edema.  The cough is worse with activity.  Ineffective prior treatments have included OTC cough medication.    Current Medications (verified): 1)  Wellbutrin Sr 150 Mg Tb12 (Bupropion Hcl) .Marland Kitchen.. 1 Tablet By Mouth Twice A Day 2)  Singulair 10 Mg Tabs (Montelukast Sodium) .Marland Kitchen.. 1 By Mouth Once Daily 3)  Celexa 20 Mg Tabs (Citalopram Hydrobromide) .Marland Kitchen.. 1 Tablet By Mouth Once A Day 4)  Veramyst 27.5 Mcg/spray Susp (Fluticasone Furoate) .... 2 Sprays Each Nostril Once Daily 5)  Alprazolam 0.5 Mg  Tabs (Alprazolam) .Marland Kitchen.. 1 Three Times A Day As Needed 6)  Proair Hfa 108 (90 Base) Mcg/act  Aers (Albuterol Sulfate) .... As Needed 7)  Xopenex 1.25 Mg/20ml  Nebu (Levalbuterol Hcl) .... Nebulizer As Needed 8)  Symbicort 160-4.5 Mcg/act Aero (Budesonide-Formoterol Fumarate) .... 2 Puffs Two Times A Day 9)  Kaon-Cl-10 10 Meq  Tbcr (Potassium Chloride) .Marland Kitchen.. 1 By Mouth Once Daily 10)  Zyrtec Allergy 10 Mg  Tabs (Cetirizine Hcl) .Marland Kitchen.. 1 By Mouth Once Daily 11)  Imitrex 100 Mg Tabs (Sumatriptan Succinate) .... As Directed 12)   Lisinopril-Hydrochlorothiazide 10-12.5 Mg Tabs (Lisinopril-Hydrochlorothiazide) .Marland Kitchen.. 1 By Mouth Once Daily 13)  Hydrochlorothiazide 25 Mg Tabs (Hydrochlorothiazide) .Marland Kitchen.. 1 By Mouth Once Daily 14)  Zithromax Z-Pak 250 Mg Tabs (Azithromycin) .... As Directed 15)  Tussionex Pennkinetic Er 8-10 Mg/31ml Lqcr (Chlorpheniramine-Hydrocodone) .Marland Kitchen.. 1 Tsp By Mouth At Bedtime  Allergies: 1)  ! Pcn 2)  ! * Ivp Dye  Past History:  Past medical, surgical, family and social histories (including risk factors) reviewed for relevance to current acute and chronic problems.  Past Medical History: Reviewed history from 02/08/2008 and no changes required. Asthma Anxiety MHA Hypertension  Family History: Reviewed history from 02/24/2008 and no changes required. Family History of Asthma Family History Diabetes 1st degree relative Family History High cholesterol Family History Hypertension throat CA--F  Social History: Reviewed history from 02/24/2008 and no changes required. Married Never Smoked Drug use-no Regular exercise-no  Review of Systems      See HPI  Physical Exam  General:  Well-developed,well-nourished,in no acute distress; alert,appropriate and cooperative throughout examination Neck:  No deformities, masses, or tenderness noted. Lungs:  normal respiratory effort, R decreased breath sounds, and L decreased breath sounds.   Heart:  normal rate and no murmur.   Skin:  Intact without suspicious lesions or rashes Cervical Nodes:  No lymphadenopathy noted Psych:  Oriented X3 and normally interactive.  Impression & Recommendations:  Problem # 1:  ASTHMATIC BRONCHITIS, ACUTE (ICD-466.0)  The following medications were removed from the medication list:    Zithromax Z-pak 250 Mg Tabs (Azithromycin) .Marland Kitchen... As directed    Tussionex Pennkinetic Er 8-10 Mg/75ml Lqcr (Chlorpheniramine-hydrocodone) .Marland Kitchen... 1 tsp by mouth at bedtime Her updated medication list for this problem includes:     Singulair 10 Mg Tabs (Montelukast sodium) .Marland Kitchen... 1 by mouth once daily    Proair Hfa 108 (90 Base) Mcg/act Aers (Albuterol sulfate) .Marland Kitchen... As needed    Xopenex 1.25 Mg/3ml Nebu (Levalbuterol hcl) ..... Nebulizer as needed    Symbicort 160-4.5 Mcg/act Aero (Budesonide-formoterol fumarate) .Marland Kitchen... 2 puffs two times a day    Zithromax Z-pak 250 Mg Tabs (Azithromycin) .Marland Kitchen... As directed    Tussionex Pennkinetic Er 8-10 Mg/30ml Lqcr (Chlorpheniramine-hydrocodone) .Marland Kitchen... 1 tsp by mouth at bedtime  Complete Medication List: 1)  Wellbutrin Sr 150 Mg Tb12 (Bupropion hcl) .Marland Kitchen.. 1 tablet by mouth twice a day 2)  Singulair 10 Mg Tabs (Montelukast sodium) .Marland Kitchen.. 1 by mouth once daily 3)  Celexa 20 Mg Tabs (Citalopram hydrobromide) .Marland Kitchen.. 1 tablet by mouth once a day 4)  Veramyst 27.5 Mcg/spray Susp (Fluticasone furoate) .... 2 sprays each nostril once daily 5)  Alprazolam 0.5 Mg Tabs (Alprazolam) .Marland Kitchen.. 1 three times a day as needed 6)  Proair Hfa 108 (90 Base) Mcg/act Aers (Albuterol sulfate) .... As needed 7)  Xopenex 1.25 Mg/25ml Nebu (Levalbuterol hcl) .... Nebulizer as needed 8)  Symbicort 160-4.5 Mcg/act Aero (Budesonide-formoterol fumarate) .... 2 puffs two times a day 9)  Kaon-cl-10 10 Meq Tbcr (Potassium chloride) .Marland Kitchen.. 1 by mouth once daily 10)  Zyrtec Allergy 10 Mg Tabs (Cetirizine hcl) .Marland Kitchen.. 1 by mouth once daily 11)  Imitrex 100 Mg Tabs (Sumatriptan succinate) .... As directed 12)  Lisinopril-hydrochlorothiazide 10-12.5 Mg Tabs (Lisinopril-hydrochlorothiazide) .Marland Kitchen.. 1 by mouth once daily 13)  Hydrochlorothiazide 25 Mg Tabs (Hydrochlorothiazide) .Marland Kitchen.. 1 by mouth once daily 14)  Zithromax Z-pak 250 Mg Tabs (Azithromycin) .... As directed 15)  Tussionex Pennkinetic Er 8-10 Mg/20ml Lqcr (Chlorpheniramine-hydrocodone) .Marland Kitchen.. 1 tsp by mouth at bedtime  Other Orders: Venipuncture ([address]) TLB-BMP (Basic Metabolic Panel-BMET) (80048-METABOL) Prescriptions: SYMBICORT 160-4.5 MCG/ACT AERO (BUDESONIDE-FORMOTEROL FUMARATE)  2 puffs two times a day  #3 x 3   Entered and Authorized by:   Loreen Freud DO   Signed by:   Loreen Freud DO on 03/21/2009   Method used:   Print then Give to Patient   RxID:   [id number] TUSSIONEX PENNKINETIC ER 8-10 MG/5ML LQCR (CHLORPHENIRAMINE-HYDROCODONE) 1 tsp by mouth at bedtime  #6 oz x 0   Entered and Authorized by:   Loreen Freud DO   Signed by:   Loreen Freud DO on 03/21/2009   Method used:   Print then Give to Patient   RxID:   [id number] ZITHROMAX Z-PAK 250 MG TABS (AZITHROMYCIN) as directed  #1 x 0   Entered and Authorized by:   Loreen Freud DO   Signed by:   Loreen Freud DO on 03/21/2009   Method used:   Print then Give to Patient   RxID:   [id number] LISINOPRIL-HYDROCHLOROTHIAZIDE 10-12.5 MG TABS (LISINOPRIL-HYDROCHLOROTHIAZIDE) 1 by mouth once daily  #90 x 3   Entered and Authorized by:   Loreen Freud DO   Signed by:   Loreen Freud DO on 03/21/2009   Method used:   Print then Give to Patient   RxID:   [id number] SYMBICORT 160-4.5 MCG/ACT AERO (BUDESONIDE-FORMOTEROL FUMARATE) 2 puffs two  times a day  #1 x 5   Entered and Authorized by:   Loreen Freud DO   Signed by:   Loreen Freud DO on 03/21/2009   Method used:   Print then Give to Patient   RxID:   [id number] PROAIR HFA 108 (90 BASE) MCG/ACT  AERS (ALBUTEROL SULFATE) as needed  #1 x 3   Entered and Authorized by:   Loreen Freud DO   Signed by:   Loreen Freud DO on 03/21/2009   Method used:   Print then Give to Patient   RxID:   [id number] ALPRAZOLAM 0.5 MG  TABS (ALPRAZOLAM) 1 three times a day as needed  #90 x 0   Entered and Authorized by:   Loreen Freud DO   Signed by:   Loreen Freud DO on 03/21/2009   Method used:   Print then Give to Patient   RxID:   [id number]

## 2010-04-18 NOTE — Assessment & Plan Note (Signed)
Summary: SORE THROAT & EARACHE/RH......Deborah Watts   Vital Signs:  Patient profile:   45 year old female Weight:      218.0 pounds O2 Sat:      96 % on Room air Temp:     98.6 degrees F oral Pulse rate:   93 / minute Pulse rhythm:   regular BP sitting:   108 / 72  (left arm)  Vitals Entered By: Almeta Monas CMA Duncan Dull) (November 22, 2009 11:36 AM)  O2 Flow:  Room air CC: c/o right ear pain and sore throat, URI symptoms   History of Present Illness:       This is a 45 year old woman who presents with URI symptoms.  The symptoms began 1 week ago.  The patient complains of sore throat and earache, but denies nasal congestion, clear nasal discharge, purulent nasal discharge, dry cough, productive cough, and sick contacts.  Associated symptoms include fever.  The patient denies low-grade fever (<100.5 degrees), fever of 100.5-103 degrees, fever of 103.1-104 degrees, fever to >104 degrees, stiff neck, dyspnea, wheezing, rash, vomiting, diarrhea, use of an antipyretic, and response to antipyretic.  The patient also reports itchy throat.  The patient denies itchy watery eyes, sneezing, seasonal symptoms, response to antihistamine, headache, muscle aches, and severe fatigue.  Risk factors for Strep sinusitis include Strep exposure and tender adenopathy.  The patient denies the following risk factors for Strep sinusitis: unilateral facial pain, unilateral nasal discharge, poor response to decongestant, double sickening, tooth pain, and absence of cough.    Current Medications (verified): 1)  Singulair 10 Mg Tabs (Montelukast Sodium) .Deborah Watts.. 1 By Mouth Once Daily 2)  Veramyst 27.5 Mcg/spray Susp (Fluticasone Furoate) .... 2 Sprays Each Nostril Once Daily 3)  Alprazolam 0.5 Mg  Tabs (Alprazolam) .Deborah Watts.. 1 Three Times A Day As Needed 4)  Proair Hfa 108 (90 Base) Mcg/act  Aers (Albuterol Sulfate) .... As Needed 5)  Xopenex 1.25 Mg/38ml  Nebu (Levalbuterol Hcl) .... Nebulizer As Needed 6)  Symbicort 160-4.5 Mcg/act  Aero (Budesonide-Formoterol Fumarate) .... 2 Puffs Two Times A Day 7)  Kaon-Cl-10 10 Meq  Tbcr (Potassium Chloride) .Deborah Watts.. 1 By Mouth Two Times A Day 8)  Zyrtec Allergy 10 Mg  Tabs (Cetirizine Hcl) .Deborah Watts.. 1 By Mouth Once Daily 9)  Imitrex 100 Mg Tabs (Sumatriptan Succinate) .... As Directed 10)  Lisinopril-Hydrochlorothiazide 10-12.5 Mg Tabs (Lisinopril-Hydrochlorothiazide) .Deborah Watts.. 1 By Mouth Once Daily 11)  Hydrochlorothiazide 25 Mg Tabs (Hydrochlorothiazide) .Deborah Watts.. 1 By Mouth Once Daily 12)  Zofran 8 Mg  Tabs (Ondansetron Hcl) .Deborah Watts.. 1 By Mouth Q 8h Prn 13)  Biaxin Xl 500 Mg Xr24h-Tab (Clarithromycin) .... 2 By Mouth Once Daily 14)  Prednisone 10 Mg Tabs (Prednisone) .... 3 By Mouth Once Daily For 3 Days Then 2 By Mouth Once Daily For 3 Days Then  1 By Mouth Once Daily For 3 Days  Allergies (verified): 1)  ! Pcn 2)  ! * Ivp Dye  Past History:  Past medical, surgical, family and social histories (including risk factors) reviewed for relevance to current acute and chronic problems.  Past Medical History: Reviewed history from 02/08/2008 and no changes required. Asthma Anxiety MHA Hypertension  Family History: Reviewed history from 02/24/2008 and no changes required. Family History of Asthma Family History Diabetes 1st degree relative Family History High cholesterol Family History Hypertension throat CA--F  Social History: Reviewed history from 02/24/2008 and no changes required. Married Never Smoked Drug use-no Regular exercise-no  Review of Systems  See HPI  Physical Exam  General:  Well-developed,well-nourished,in no acute distress; alert,appropriate and cooperative throughout examination Ears:  R ear + fluid, canal red Nose:  External nasal examination shows no deformity or inflammation. Nasal mucosa are pink and moist without lesions or exudates. Mouth:  pharyngeal erythema and postnasal drip.   Neck:  supple and cervical lymphadenopathy.  R >L Lungs:  Normal  respiratory effort, chest expands symmetrically. Lungs are clear to auscultation, no crackles or wheezes. Heart:  normal rate and no murmur.   Cervical Nodes:  R anterior LN tender and R anterior LN enlarged.   Psych:  Oriented X3 and normally interactive.     Impression & Recommendations:  Problem # 1:  ACUTE PHARYNGITIS (ICD-462)  The following medications were removed from the medication list:    Clarithromycin 500 Mg Tabs (Clarithromycin) .Deborah Watts... 1  by mouth 2 times daily x10 days Her updated medication list for this problem includes:    Biaxin Xl 500 Mg Xr24h-tab (Clarithromycin) .Deborah Watts... 2 by mouth once daily  Orders: Rapid Strep ([address]) T-Culture, Throat ([address])  Instructed to complete antibiotics and call if not improved in 48 hours.   Complete Medication List: 1)  Singulair 10 Mg Tabs (Montelukast sodium) .Deborah Watts.. 1 by mouth once daily 2)  Veramyst 27.5 Mcg/spray Susp (Fluticasone furoate) .... 2 sprays each nostril once daily 3)  Alprazolam 0.5 Mg Tabs (Alprazolam) .Deborah Watts.. 1 three times a day as needed 4)  Proair Hfa 108 (90 Base) Mcg/act Aers (Albuterol sulfate) .... As needed 5)  Xopenex 1.25 Mg/34ml Nebu (Levalbuterol hcl) .... Nebulizer as needed 6)  Symbicort 160-4.5 Mcg/act Aero (Budesonide-formoterol fumarate) .... 2 puffs two times a day 7)  Kaon-cl-10 10 Meq Tbcr (Potassium chloride) .Deborah Watts.. 1 by mouth two times a day 8)  Zyrtec Allergy 10 Mg Tabs (Cetirizine hcl) .Deborah Watts.. 1 by mouth once daily 9)  Imitrex 100 Mg Tabs (Sumatriptan succinate) .... As directed 10)  Lisinopril-hydrochlorothiazide 10-12.5 Mg Tabs (Lisinopril-hydrochlorothiazide) .Deborah Watts.. 1 by mouth once daily 11)  Hydrochlorothiazide 25 Mg Tabs (Hydrochlorothiazide) .Deborah Watts.. 1 by mouth once daily 12)  Zofran 8 Mg Tabs (Ondansetron hcl) .Deborah Watts.. 1 by mouth q 8h prn 13)  Biaxin Xl 500 Mg Xr24h-tab (Clarithromycin) .... 2 by mouth once daily 14)  Prednisone 10 Mg Tabs (Prednisone) .... 3 by mouth once daily for 3 days then 2 by  mouth once daily for 3 days then  1 by mouth once daily for 3 days  Patient Instructions: 1)  Take 400-600 mg of Ibuprofen (Advil, Motrin) with food every 4-6 hours as needed  for relief of pain or comfort of fever.  2)  Drink plenty fluids Prescriptions: PREDNISONE 10 MG TABS (PREDNISONE) 3 by mouth once daily for 3 days then 2 by mouth once daily for 3 days then  1 by mouth once daily for 3 days  #18 x 0   Entered and Authorized by:   Loreen Freud DO   Signed by:   Loreen Freud DO on 11/22/2009   Method used:   Electronically to        [facility]* (retail)       [address]       [place], [place]  [address]       Ph: [phone]       Fax: [phone]   RxID:   [id number] BIAXIN XL 500 MG XR24H-TAB (CLARITHROMYCIN) 2 by mouth once daily  #14 x 0   Entered and Authorized by:   Loreen Freud  DO   Signed by:   Loreen Freud DO on 11/22/2009   Method used:   Electronically to        [facility]* (retail)       [address]       [place], [place]  [address]       Ph: [phone]       Fax: [phone]   RxID:   [id number]

## 2010-05-02 ENCOUNTER — Telehealth (INDEPENDENT_AMBULATORY_CARE_PROVIDER_SITE_OTHER): Payer: Self-pay | Admitting: *Deleted

## 2010-05-08 NOTE — Progress Notes (Signed)
Summary: refill  Phone Note Refill Request Message from:  Fax from Pharmacy on May 02, 2010 9:34 AM  Refills Requested: Medication #1:  KAON-CL-10 10 MEQ  TBCR 1 by mouth two times a day.... [facility] - fax [phone]  Initial call taken by: Okey Regal [place],  May 02, 2010 9:35 AM    Prescriptions: KAON-CL-10 10 MEQ  TBCR (POTASSIUM CHLORIDE) 1 by mouth two times a day....  #60 x 3   Entered by:   Doristine Devoid CMA   Authorized by:   Loreen Freud DO   Signed by:   Doristine Devoid CMA on 05/02/2010   Method used:   Electronically to        [facility]* (retail)       [address]       [place], [place]  [address]       Ph: [phone]       Fax: [phone]   RxID:   [id number]

## 2010-05-17 ENCOUNTER — Ambulatory Visit (INDEPENDENT_AMBULATORY_CARE_PROVIDER_SITE_OTHER): Payer: 59 | Admitting: Family Medicine

## 2010-05-17 ENCOUNTER — Encounter: Payer: Self-pay | Admitting: Family Medicine

## 2010-05-17 DIAGNOSIS — J019 Acute sinusitis, unspecified: Secondary | ICD-10-CM

## 2010-05-17 DIAGNOSIS — I10 Essential (primary) hypertension: Secondary | ICD-10-CM

## 2010-05-23 NOTE — Assessment & Plan Note (Signed)
Summary: head cold, cough, ///sph   Vital Signs:  Patient profile:   45 year old female Height:      59 inches Weight:      206 pounds O2 Sat:      99 % on Room air Temp:     97.7 degrees F oral Pulse rate:   98 / minute BP sitting:   86 / 67  (left arm)  Vitals Entered By: Jeremy Johann CMA (May 17, 2010 2:45 PM)  O2 Flow:  Room air CC: head congestion, cough, facial pain x1day   History of Present Illness: 45 yo woman here today ? sinus infxn.  sxs started last night w/ sinus HA and pressure.  this AM had nasal congestion, no fever.  some cough.  + facial pain.  + tooth pain.  + sick contacts.  Current Medications (verified): 1)  Singulair 10 Mg Tabs (Montelukast Sodium) .Marland Kitchen.. 1 By Mouth Once Daily 2)  Nasonex 50 Mcg/act Susp (Mometasone Furoate) .... 2 Sprays Each Nostril Once Daily 3)  Alprazolam 0.5 Mg  Tabs (Alprazolam) .Marland Kitchen.. 1 Three Times A Day As Needed 4)  Proair Hfa 108 (90 Base) Mcg/act  Aers (Albuterol Sulfate) .... As Needed 5)  Xopenex 1.25 Mg/65ml  Nebu (Levalbuterol Hcl) .... Nebulizer As Needed 6)  Dulera 100-5 Mcg/act Aero (Mometasone Furo-Formoterol Fum) .... 2 Puffs Two Times A Day 7)  Kaon-Cl-10 10 Meq  Tbcr (Potassium Chloride) .Marland Kitchen.. 1 By Mouth Two Times A Day.... 8)  Zyrtec Allergy 10 Mg  Tabs (Cetirizine Hcl) .Marland Kitchen.. 1 By Mouth Once Daily 9)  Imitrex 100 Mg Tabs (Sumatriptan Succinate) .... As Directed 10)  Lisinopril-Hydrochlorothiazide 10-12.5 Mg Tabs (Lisinopril-Hydrochlorothiazide) .Marland Kitchen.. 1 By Mouth Once Daily 11)  Hydrochlorothiazide 25 Mg Tabs (Hydrochlorothiazide) .Marland Kitchen.. 1 By Mouth Once Daily 12)  Zofran 8 Mg  Tabs (Ondansetron Hcl) .Marland Kitchen.. 1 By Mouth Q 8h Prn 13)  Lotrisone 1-0.05 % Crea (Clotrimazole-Betamethasone) .... Apply Two Times A Day  Allergies (verified): 1)  ! Pcn 2)  ! * Ivp Dye  Review of Systems      See HPI  Physical Exam  General:  Well-developed,well-nourished,in no acute distress; alert,appropriate and cooperative throughout  examination Head:  + TTP over frontal and maxillary sinuses Eyes:  no injxn or inflammation Ears:  External ear exam shows no significant lesions or deformities.  Otoscopic examination reveals clear canals, tympanic membranes are intact bilaterally without bulging, retraction, inflammation or discharge. Hearing is grossly normal bilaterally. Nose:  + congestion and clear rhinorrhea Mouth:  Oral mucosa and oropharynx without lesions or exudates.  Teeth in good repair. Neck:  No deformities, masses, or tenderness noted. Lungs:  Normal respiratory effort, chest expands symmetrically. Lungs are clear to auscultation, no crackles or wheezes.  no cough heard Heart:  Normal rate and regular rhythm. S1 and S2 normal without gallop, murmur, click, rub or other extra sounds. Cervical Nodes:  No lymphadenopathy noted   Impression & Recommendations:  Problem # 1:  SINUSITIS, ACUTE NOS (ICD-461.9) Assessment Unchanged start Biaxin for sinus infxn.  tessalon as needed for cough.  reviewed supportive care and red flags that should prompt return.  Pt expresses understanding and is in agreement w/ this plan. Her updated medication list for this problem includes:    Nasonex 50 Mcg/act Susp (Mometasone furoate) .Marland Kitchen... 2 sprays each nostril once daily    Clarithromycin 500 Mg Xr24h-tab (Clarithromycin) .Marland Kitchen... 2 tabs by mouth daily x10 days    Tessalon 200 Mg Caps (Benzonatate) .Marland KitchenMarland KitchenMarland KitchenMarland Kitchen  Take one capsule by mouth three times a day as needed for cough  Problem # 2:  HYPERTENSION (ICD-401.9) pt actually hypotensive today- denies dizziness, CP, SOB.  told pt to hold lisinopril for next 2 days and record BP readings.  she is to call Monday w/ the results Her updated medication list for this problem includes:    Lisinopril-hydrochlorothiazide 10-12.5 Mg Tabs (Lisinopril-hydrochlorothiazide) .Marland Kitchen... 1 by mouth once daily    Hydrochlorothiazide 25 Mg Tabs (Hydrochlorothiazide) .Marland Kitchen... 1 by mouth once daily  Complete  Medication List: 1)  Singulair 10 Mg Tabs (Montelukast sodium) .Marland Kitchen.. 1 by mouth once daily 2)  Nasonex 50 Mcg/act Susp (Mometasone furoate) .... 2 sprays each nostril once daily 3)  Alprazolam 0.5 Mg Tabs (Alprazolam) .Marland Kitchen.. 1 three times a day as needed 4)  Proair Hfa 108 (90 Base) Mcg/act Aers (Albuterol sulfate) .... As needed 5)  Xopenex 1.25 Mg/73ml Nebu (Levalbuterol hcl) .... Nebulizer as needed 6)  Dulera 100-5 Mcg/act Aero (Mometasone furo-formoterol fum) .... 2 puffs two times a day 7)  Kaon-cl-10 10 Meq Tbcr (Potassium chloride) .Marland Kitchen.. 1 by mouth two times a day.... 8)  Zyrtec Allergy 10 Mg Tabs (Cetirizine hcl) .Marland Kitchen.. 1 by mouth once daily 9)  Imitrex 100 Mg Tabs (Sumatriptan succinate) .... As directed 10)  Lisinopril-hydrochlorothiazide 10-12.5 Mg Tabs (Lisinopril-hydrochlorothiazide) .Marland Kitchen.. 1 by mouth once daily 11)  Hydrochlorothiazide 25 Mg Tabs (Hydrochlorothiazide) .Marland Kitchen.. 1 by mouth once daily 12)  Zofran 8 Mg Tabs (Ondansetron hcl) .Marland Kitchen.. 1 by mouth q 8h prn 13)  Lotrisone 1-0.05 % Crea (Clotrimazole-betamethasone) .... Apply two times a day 14)  Clarithromycin 500 Mg Xr24h-tab (Clarithromycin) .... 2 tabs by mouth daily x10 days 15)  Tessalon 200 Mg Caps (Benzonatate) .... Take one capsule by mouth three times a day as needed for cough  Patient Instructions: 1)  Take the Clarithromycin as directed for the sinus infection 2)  Use the Tessalon as needed for cough 3)  Drink plenty of fluids 4)  Continue the Nasonex and Zyrtec 5)  REST! 6)  Hang in there!!! Prescriptions: CLARITHROMYCIN 500 MG XR24H-TAB (CLARITHROMYCIN) 2 tabs by mouth daily x10 days  #20 x 0   Entered and Authorized by:   Neena Rhymes MD   Signed by:   Neena Rhymes MD on 05/17/2010   Method used:   Electronically to        [facility]* (retail)       [address]       [place], [place]  [address]       Ph: [phone]       Fax: [phone]   RxID:   [id number] TESSALON 200 MG CAPS  (BENZONATATE) Take one capsule by mouth three times a day as needed for cough  #60 x 0   Entered and Authorized by:   Neena Rhymes MD   Signed by:   Neena Rhymes MD on 05/17/2010   Method used:   Electronically to        [facility]* (retail)       [address]       [place], [place]  [address]       Ph: [phone]       Fax: [phone]   RxID:   [id number]    Orders Added: 1)  Est. Patient Level III [06[address]]

## 2010-06-14 ENCOUNTER — Other Ambulatory Visit: Payer: Self-pay | Admitting: Family Medicine

## 2010-06-20 ENCOUNTER — Other Ambulatory Visit: Payer: Self-pay

## 2010-06-20 MED ORDER — ALBUTEROL SULFATE HFA 108 (90 BASE) MCG/ACT IN AERS: 2.0000 | INHALATION_SPRAY | Freq: Four times a day (QID) | RESPIRATORY_TRACT | Status: DC | PRN

## 2010-07-08 ENCOUNTER — Other Ambulatory Visit: Payer: Self-pay

## 2010-07-08 MED ORDER — ALPRAZOLAM 0.5 MG PO TABS: 0.5000 mg | ORAL_TABLET | Freq: Three times a day (TID) | ORAL | Status: DC | PRN

## 2010-07-08 NOTE — Telephone Encounter (Signed)
Last seen 05/17/10 and filled 03/19/10 # 90 with 2 refills  Please advise     KP

## 2010-07-08 NOTE — Telephone Encounter (Signed)
Faxed.   KP 

## 2010-08-02 NOTE — Op Note (Signed)
NAME:  Deborah Watts, Deborah Watts                           ACCOUNT NO.:  [id number]   MEDICAL RECORD NO.:  [id number]                   PATIENT TYPE:  AMB   LOCATION:  [facility]                                  FACILITY:  [facility]   PHYSICIAN:  Juan H. Lily Peer, M.D.             DATE OF BIRTH:  [birth year]   DATE OF PROCEDURE:  04/20/2002  DATE OF DISCHARGE:                                 OPERATIVE REPORT   SURGEON:  Juan H. Lily Peer, M.D.   FIRST ASSISTANT:  Daniel L. Eda Paschal, M.D.   INDICATION FOR OPERATION:  A 45 year old gravida 2, para 1, AB1 with  previous transvaginal hysterectomy and bladder suspension with chronic  pelvic pain on right greater than left and a history of recurrent ovarian  cysts.   PREOPERATIVE DIAGNOSES:  Chronic pelvic pain.   POSTOPERATIVE DIAGNOSES:  1. Hemorrhagic right ovarian cyst with hydrosalpinx.  2. Pelvic adhesions.   ANESTHESIA:  General endotracheal anesthesia.   PROCEDURE PERFORMED:  1. Attempted laparoscopy.  2. Exploratory laparotomy.  3. Lysis of pelvic adhesions.  4. Right salpingo-oophorectomy.   FINDINGS:  Extensive pelvic adhesions.  Right adnexa adhered to the right  ovary and tube.  Also adhered left tube and ovary.  On the right tube there  appeared to be hydrosalpinx and hemorrhagic cyst.   DESCRIPTION OF OPERATION:  After the patient was adequately counseled she  was taken to the operating room.  She received 1 g of Cefotan for  prophylaxis.  She had pneumatic compression stockings in place.  After  general endotracheal anesthesia was obtained she was placed in the low  lithotomy position.  The abdomen, vagina, and perineum were prepped and  draped in the usual sterile fashion.  A Foley catheter was inserted in an  effort to monitor urinary output.  A ring forceps with a sponge was left in  the vagina to try to put some tension at time of the laparoscopy for  exposure.  After the drapes were in place a small transverse umbilical  incision adjacent to the previous laparoscopic cholecystectomy scar site was  made.  The peritoneal cavity had to be exposed by tenting it with a hemostat  in __________  and placement of the 10 mm trocar.  Two additional ports were  utilized with 10 mm trocars lateral to the midline under laparoscopic  guidance of lower abdomen.  The patient was placed in as much Trendelenburg  as possible.  Due to the patient's obesity exposure was a difficult  technical problem but nevertheless, after freeing some adhesion the right  adnexa was able to be identified and the right infundibulopelvic ligament  was started to be transected with the use of the harmonic scalpel.  Again,  exposure made it difficult.  There was a small pumper which was on the  infundibulopelvic ligament which was extensively cauterized with the bipolar  forceps and then the laparoscopic portion was  aborted and an open laparotomy  technique was utilized by making a Pfannenstiel skin incision.  The skin  incision was made.  Incision was carried down through the subcutaneous  tissue down to the rectus fascia whereby a midline nick was made.  The  fascia was incised in a transverse fashion.  The peritoneal cavity was  entered cautiously.  Retractors were in place.  Identification of the  infundibulopelvic ligament appeared to be adequate hemostasis but it was  secured with a 0 Vicryl suture.  The right ureter was identified with good  peristalsis and after additional freeing of adhesions from the right tube  and ovary which was encased in adhesions to the right pelvic side Maroney, the  right tube and ovary were passed off the operative field.  Inspection of the  left tube and ovary demonstrated was normal in appearance, mildly adhered  which was freed.  The pelvic cavity was then copiously irrigated with normal  saline solution and Surgicel was used for the raw surfaces for additional  hemostasis.  The sponges were removed and the  visceroperitoneum was not  reapproximated but the rectus fascia was closed with a ring stitch of 0  Vicryl suture.  A suprafascial Jackson-Pratt drain was placed and the skin  was closed with skin clips.  The JP drain exited through one of the 5 mm  port sites and was secured with 3-0 Vicryl suture and attached to a grenade  suction.  The other 5 mm trocar site was closed with staple also as well as  the subumbilical incision but before closing the subumbilical incision the  fascia was closed with a locking stitch of 0 Vicryl suture.  For  postoperative analgesia 0.25% Marcaine for a total of 18 mL was infiltrated  in all three incision sites.  The sponge was removed.  The patient was  awakened and transferred to recovery room in with stable vital signs.  Blood  loss was 75 mL.  IV fluids 1500 mL lactated Ringer's.  She had 150 mL of  urine output.  A postoperative abdominal x-ray will be done due to the fact  that emergently the laparoscopy tray was opened up in effort to gain  entrance to the abdominal cavity quickly when no suspicions of bleeding from  the infundibulopelvic ligament so this is being done since the sponge count  was not able to be performed prior to the case.  We will update this when we  get the report back on the patient progress note.  The patient was extubated  and transferred to recovery room with stable vital signs.                                               Juan H. Lily Peer, M.D.    JHF/MEDQ  D:  04/20/2002  T:  04/20/2002  Job:  [id number]

## 2010-08-02 NOTE — H&P (Signed)
NAME:  Deborah Watts, Deborah Watts                           ACCOUNT NO.:  [id number]   MEDICAL RECORD NO.:  [id number]                   PATIENT TYPE:  AMB   LOCATION:  SDC                                  FACILITY:  [facility]   PHYSICIAN:  Juan H. Lily Peer, M.D.             DATE OF BIRTH:  [birth year]   DATE OF ADMISSION:  DATE OF DISCHARGE:                                HISTORY & PHYSICAL   NOTE:  The patient is scheduled for surgery tomorrow, February 04th, at 7:30  A.M. at [facility].   CHIEF COMPLAINT:  Chronic pelvic pain, right greater than left.  History of  recurrent ovarian cyst.   HISTORY:  The patient is a 45 year old gravida 2, para 1, AB 1 with previous  transvaginal hysterectomy who for the past several months has been followed  in [facility] complaining of right lower quadrant pain greater than the left.  She  has had several ultrasounds that we have followed since October 27th and  initially there appeared to be a hydrosalpinx in the right fallopian tube,  but was not seen on ultrasound on January 21st.  She, despite being put on  narcotics and anti-inflammatories, and follow up scans, continues to have a  cyst on her right ovary, which is thick-walled, irregular, cystic lumen  measuring 12 x 10 x 11 mm, negative color flow Doppler.  The left ovary  demonstrated a 17 x 15 x 12 mm smooth Bessey mass with internal low level  echos suggesting involuting cyst in comparison to the study from December  03rd and negative color flow Doppler, and there was negative fluid in the  cul-de-sac.  She continues to complain of dyspareunia and right lower  quadrant pain, and has decided to proceed with a diagnostic laparoscopy, and  has wanted to have her right tube and ovary removed regardless of the  findings and possible left cystectomy.  The patient is fully aware that  after such operation she may continue to have the right lower quadrant pain,  which may need further evaluation such as GI and she  would like to proceed  at this point with the laparoscopy.   PAST MEDICAL HISTORY:  The patient has had two normal spontaneous vaginal  deliveries.  She has had a sling for bladder suspension in 2002.  She is  being followed by Dr. Laney Potash at [facility].  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   ALLERGIES:  The patient is allergic to PENICILLIN and IVP DYE.   MEDICATIONS:  The patient's medications also consists of Singulair, Soma,  Effexor and Advil.  She suffers from asthma and takes the inhalers on a  p.r.n. basis.   PAST SURGICAL HISTORY:  The patient's surgeries have included:  1. TVH.  2. Postpartum tubal ligation, [place].  3. She has had hiatal hernia and herniated disk.  FAMILY HISTORY:  Father with benign colon polyp and grandmother with colon  cancer.   The patient has had recent TSH and blood sugar tests along with a Pap smear,  which all have been normal as was her last mammogram in April 2001, which  was normal.   PHYSICAL EXAMINATION:  VITAL SIGNS:  The patient weighs 212 pounds.  Blood  pressure is 96/64.  She is 5 feet 4 inches tall.  HEENT:  Unremarkable.  NECK:  Supple.  Trachea midline.  No carotid bruits.  No thyromegaly.  LUNGS:  Clear to auscultation without rhonchi or wheezes.  HEART:  Regular rate and rhythm.  No murmurs or gallops.  BREAST EXAMINATION:  Normal at the time of annual exam.  ABDOMEN:  Pendulous.  No palpable masses.  PELVIC EXAMINATION:  Bimanual exam;  vaginal cuff intact.  Adnexa is  difficult assess due to the patient's obesity and that why we did follow up  with the ultrasound.  RECTAL EXAMINATION:  Unremarkable.   ASSESSMENT:  [age]-year-old gravida 2, para 1 with chronic pelvic  pain, right greater than left and history of recurrent cyst on both ovaries.  The patient appears to have an involuting corpus luteum cyst on the left,  persistence of a thickened small  right ovarian cyst and due to the patient's  continued pain she eventually has decided to proceed with a diagnostic  laparoscopy and wants to have her right tube and ovary removed regardless of  the findings; and, also the possibility of doing a cystectomy or  salpingectomy or bilateral salpingo-oophorectomy which may result in the  patient having to be put on hormone replacement therapy for the remainder of  her life.   Risks of the operation include the potential risks of trauma to internal  organs, especially the bladder, intestines, nerves or blood vessels  requiring emergency open laparotomy; or, if technical difficulties are  encountered we may abort the laparoscopic portion and proceed then with an  open laparotomy to gain access to the abdominopelvic cavity to proceed with  her surgery.  Also risks of infection, although she will receive  prophylactic antibiotic; and, in the event uncontrollable hemorrhage there  is an increased risk for anaphylactic reaction, hepatitis and AIDS from a  blood transfusion.  Also the risk of deep venous thrombosis and pulmonary  embolism.  She will be placed on pneumatic compression stockings as well.  If, despite, all the above-procedures done, she could continue to have  stomach pain or lower abdominal pain, and may require additional  intervention or follow up by a gastroenterologist.  All the risks and  benefits outlined above were discussed with the patient.  All questions were  answered and we will follow according to plan.  The patient is scheduled for  a diagnostic laparoscopy with right salpingo-oophorectomy and possible left  ovarian cystectomy tomorrow, February 04th, at [facility].                                                 Mission H. Lily Peer, M.D.    JHF/MEDQ  D:  04/19/2002  T:  04/20/2002  Job:  [id number]

## 2010-08-02 NOTE — H&P (Signed)
Deborah Watts, Deborah Watts                 ACCOUNT NO.:  [id number]   MEDICAL RECORD NO.:  [id number]          PATIENT TYPE:  INP   LOCATION:  1614                         FACILITY:  [facility]   PHYSICIAN:  Leonie Man, M.D.   DATE OF BIRTH:  1966/01/01   DATE OF ADMISSION:  01/16/2005  DATE OF DISCHARGE:                                HISTORY & PHYSICAL   PROBLEM:  Abdominal pain, nausea, without vomiting x24 hours.   The patient is a 45 year old white female with onset of a 24-hour history of  abdominal pain, now localizing in the right lower quadrant. This was  associated with severe nausea but without vomiting. It was noted that the  patient underwent Nissen fundoplication in the past.   On evaluation in the emergency room, she has a white count of 15,000. A CT  scan of the abdomen is consistent with acute appendicitis.   PAST MEDICAL HISTORY:  1.  The patient is status post antireflux surgery.  2.  Status post total abdominal hysterectomy.  3.  Status post laparoscopic cholecystectomy.  4.  She also has problems with osteoarthritis.  5.  Migraines.  6.  Asthma.   FAMILY HISTORY:  Noncontributory.   ALLERGIES:  She is allergic to IVP DYE, PENICILLIN, and to MORPHINE.   CURRENT MEDICATIONS:  Singulair, Lexapro, Wellbutrin, Ventolin inhaler,  Advair, Celebrex, Tramadol.   REVIEW OF SYSTEMS:  Negative in detail except as outlined in the present  illness and past medical history.   PHYSICAL EXAMINATION:  VITAL SIGNS:  The patient's temperature is 98.1,  pulse 92, respirations 20, blood pressure 136/88.  HEENT:  Head normocephalic. Pupils round and regular. No scleral icterus.  Oropharynx benign.  NECK:  Supple. No thyromegaly or adenopathy.  CHEST:  Clear to auscultation bilaterally.  HEART:  Regular rate and rhythm without murmurs, gallops, or rubs.  ABDOMEN:  Tender in the right lower quadrant and obese. Bowel sounds are  absent.  EXTREMITIES:  Decreased range of motion due to  the patient's obesity.  Otherwise, there is no calf tenderness or ankle edema.  SKIN:  Clear.  NEUROLOGICAL:  The patient is fully alert and oriented without any  lateralizing signs.   LABORATORY DATA:  WBC 15,000. CT scan consistent with acute appendicitis.   IMPRESSION:  Acute appendicitis.   PLAN:  For laparoscopic appendectomy, possible open appendectomy.      Leonie Man, M.D.  Electronically Signed     PB/MEDQ  D:  01/18/2005  T:  01/18/2005  Job:  [id number]

## 2010-08-02 NOTE — Discharge Summary (Signed)
   NAME:  Deborah Watts, Deborah Watts                           ACCOUNT NO.:  [id number]   MEDICAL RECORD NO.:  [id number]                   PATIENT TYPE:  INP   LOCATION:  9327                                 FACILITY:  [facility]   PHYSICIAN:  Juan H. Lily Peer, M.D.             DATE OF BIRTH:  1966/02/13   DATE OF ADMISSION:  04/20/2002  DATE OF DISCHARGE:  04/22/2002                                 DISCHARGE SUMMARY   DISCHARGE DIAGNOSIS:  1. Chronic pelvic pain with a history of prior transvaginal hysterectomy and     bladder suspension.  2. History of current ovarian cyst.  3. Status post attempted laparoscopy, exploratory laparotomy, lysis of     pelvic adhesionis, right salpingo-oophorectomy by Gaetano Hawthorne. Lily Peer, M.D.     on 04/20/2002.  Postoperative diagnosis was hemorrhagic right ovarian cyst     with hydrosalpinx and pelvic adhesions.   HISTORY AND PHYSICAL:  This is a 45 year old white female, gravida 2, para  1, with history of prior transvaginal hysterectomy and bladder suspension  with chronic pelvic pain on the right greater than left and history of  recurrent right ovarian cyst.  The patient was admitted for surgery for  same.   HOSPITAL COURSE:  On 04/20/2002, the patient did undergo attempted  laparoscopy, exploratory laparotomy, lysis of pelvic adhesions, and right  salpingo-oophorectomy by Gaetano Hawthorne. Lily Peer, M.D. and found to have a  hemorrhagic right ovarian cyst with hydrosalpinx and pelvic adhesions.   Postoperatively, it was noted that the patient went to PACU, with history of  sleep apnea, it was suggested she be referred for sleep study.  She remained  afebrile, voiding, stable admission, and discharged to home on 04/22/2002 and  given instructions.  Jackson-Pratt drain was also removed prior to  discharge.   LABORATORY DATA:  Hemoglobin on 04/23/2002 11.5.   DISPOSITION:  The patient was discharged to home.  She will return to the  office next week to remove staples.  She was  given Demerol 50 mg p.r.n.  pain.     Susa Loffler, P.A.                    Juan H. Lily Peer, M.D.    TSG/MEDQ  D:  05/27/2002  T:  05/28/2002  Job:  [id number]

## 2010-08-02 NOTE — Op Note (Signed)
Deborah Watts, ROUNDTREE                 ACCOUNT NO.:  [id number]   MEDICAL RECORD NO.:  [id number]          PATIENT TYPE:  AMB   LOCATION:  DAY                          FACILITY:  [facility]   PHYSICIAN:  Leonie Man, M.D.   DATE OF BIRTH:  Feb 09, 1966   DATE OF PROCEDURE:  01/16/2005  DATE OF DISCHARGE:                                 OPERATIVE REPORT   PREOPERATIVE DIAGNOSIS:  Acute appendicitis   POSTOPERATIVE DIAGNOSIS:  Acute ruptured appendicitis.   PROCEDURE:  Laparoscopic appendectomy.   SURGEON:  Leonie Man, M.D.   ASSISTANT:  OR tech.   ANESTHESIA:  General.   The patient is a 45 year old woman presenting with right-sided abdominal  pain and nausea and on CT scan noted to have an acute appendicitis. She was  afebrile on presentation but had a leukocytosis of 19,000. The patient is  obese and is status post total abdominal hysterectomy, cholecystectomy and  Nissen fundoplication. She comes to the operating room after the risks and  potential benefits of surgery have been discussed. All questions answered  and consent obtained.   PROCEDURE:  Following the induction of satisfactory general anesthesia, the  patient is positioned supinely and the abdomen is prepped and draped to be  included in a sterile operative field. Open laparoscopy is created by a  supraumbilical incision and carried down through the old scar cicatrix of  her previous laparoscopic operations to enter the abdomen. The Hassan  cannula was inserted and insufflation carried out to 14 mmHg pressure. The  camera was inserted and visual exploration of the abdomen carried out. There  were multiple adhesions within the pelvis. Under direct vision, a suprapubic  and lateral port was placed. There were multiple adhesions that were then  taken down serially using the harmonic scalpel. The region of the appendix  showed a large appendix that was acutely inflamed with the tip of the  appendix adhered to the  abdominal sidewall. The appendix was grasped and as  dissection was begun the tip of the appendix broke off. The remaining two-  thirds of the appendix was taken out using the harmonic scalpel and  transecting the appendix at the base with a Endo-GIA. This was placed in an  Endopouch and retrieved. I then retrieved the tip of the appendix using the  harmonic scalpel to dissect it free from its attachments to the  retroperitoneum and the abdominal Portocarrero. This was also placed in an Endopouch  and retrieved through the umbilical wound. The right lower quadrant was then  copiously irrigated with multiple aliquots of normal saline and aspirated.  The stump of the appendix was inspected and noted to be secure. All areas of  dissection checked for hemostasis and noted to be dry. Sponge and instrument  counts were verified. Trocars removed under direct vision and  pneumoperitoneum allowed to deflate. The wound was then closed in layers as  follows. The umbilical wound in three layers with #0 Vicryl, 3-0 Vicryl and  4-0 Monocryl. The left lower quadrant wound and  suprapubic wound were closed with 3-0 Vicryl and 4-0 Monocryl.  All wounds  are reinforced with Steri-Strips, sterile dressings applied. The anesthetic  reversed and the patient removed from the operating room to the recovery  room in stable condition. She tolerated the procedure well.      Leonie Man, M.D.  Electronically Signed     PB/MEDQ  D:  01/17/2005  T:  01/17/2005  Job:  [id number]

## 2010-08-02 NOTE — Discharge Summary (Signed)
Deborah Watts, Deborah Watts                 ACCOUNT NO.:  [id number]   MEDICAL RECORD NO.:  [id number]          PATIENT TYPE:  INP   LOCATION:  6733                         FACILITY:  [facility]   PHYSICIAN:  Gordy Savers, M.D. LHCDATE OF BIRTH:  07-Feb-1966   DATE OF ADMISSION:  02/20/2005  DATE OF DISCHARGE:  02/23/2005                                 DISCHARGE SUMMARY   FINAL DIAGNOSES:  1.  Asthmatic bronchitis.  2.  Pneumonia.   DISCHARGE MEDICATIONS:  1.  Albuterol two inhalations q.i.d.  2.  Sterapred Dosepak 5 mg six days.  3.  Klonopin 0.5 mg h.s. p.r.n. restless legs.  4.  Avelox 400 mg daily for seven additional days.  5.  Mucinex DM one tablet b.i.d.  6.  Singulair 10 mg daily.  7.  Wellbutrin 150 mg t.i.d.  8.  Advair 250/50 one puff b.i.d.  9.  Lexapro 10 mg daily.   HISTORY OF PRESENT ILLNESS:  The patient is a 45 year old white female who  presented to the office with a 3-day history of shortness of breath, fever,  and productive cough.  Temperature was as high as 102.3.  She was initially  treated with nebulizer treatments, Biaxin, prednisone, but failed to  improve.  The day of admission she returned, complaining of increasing  shortness of breath and was noted to be hypoxemic with an O2 saturation of  88%.  The patient was subsequently admitted for further evaluation and  treatment.   LABORATORY DATA/HOSPITAL COURSE:  The patient was admitted to the general  medical floor, but she received intensive pulmonary toilet.  In addition she  was treated with Avelox and __________ steroids.  The steroids were switched  to oral and the patient continued to improve.  Chest x-ray revealed some  peribronchial thickening consistent with bronchitis, but no evidence of  pneumonia.  A chest CTA was performed to exclude pulmonary embolism.  It did  suggest  __________of pneumonia.   HOSPITAL COURSE:  Hospital course was marked by steady improvement.  On the  day of discharge the  patient was ambulatory without shortness of breath.  Chest was essentially clear.  She remained afebrile during the entire  hospital course.  O2 saturation prior to discharge was 92.   LABORATORY DATA:  Laboratory studies were unremarkable.  Blood cultures were  negative.  Chemistries were unremarkable.  Blood sugar was 92, white count  was 7.2.   DISPOSITION:  The patient will be discharged today on the medical regimen  listed above.  She has been asked to call her primary care Macall Mccroskey tomorrow  for an office visit the following day or two.   CONDITION ON DISCHARGE:  Improved.           ______________________________  Gordy Savers, M.D. [facility]     PFK/MEDQ  D:  02/23/2005  T:  02/23/2005  Job:  [id number]

## 2010-08-02 NOTE — H&P (Signed)
Deborah Watts, Deborah Watts                 ACCOUNT NO.:  [id number]   MEDICAL RECORD NO.:  [id number]          PATIENT TYPE:  INP   LOCATION:  6733                         FACILITY:  [facility]   PHYSICIAN:  Lelon Perla, M.D.  DATE OF BIRTH:  01-08-1966   DATE OF ADMISSION:  02/20/2005  DATE OF DISCHARGE:                                HISTORY & PHYSICAL   ADMISSION DIAGNOSIS:  Status asthmaticus.   HISTORY OF PRESENT ILLNESS:  The patient presented to the office today with  a 3-day history of shortness of breath, fever, congestion.  She was seen in  the office on December 5, and given Depo-Medrol 80 mg, prednisone taper,  Biaxin, and nebulizer with Xopenex three times a day and she was to continue  her Advair.  The patient had a flu test done at that time which was  negative.  The patient returns today feeling no better and actually states  that her breathing is worse.  She has increased shortness of breath even  after nebulizer treatments.   PAST MEDICAL HISTORY:  Significant for asthma, restless leg syndrome,  interstitial cystitis, and depression.   FAMILY HISTORY:  Father had larynx cancer, diabetes, and hypertension as  well as some thyroid abnormalities.  Maternal uncle with diabetes.  Paternal  aunt with PVD status post amputation secondary to PTE.   PAST SURGICAL HISTORY:  Had a hysterectomy in 2000 and cholecystectomy in  1994.   MEDICATIONS:  1.  Wellbutrin XL 150 three times a day.  2.  Singulair 10 mg at night.  3.  Advair 250/50 twice a day.  4.  Lexapro 10 mg a day.  5.  Ventolin inhaler.  6.  Ativan 1 mg 1/2 at night p.r.n.   PHYSICAL EXAMINATION:  VITAL SIGNS:  She was 211.5 pounds, pulse 99,  respirations 22, blood pressure 102/70, and afebrile today.  Pulse oximetry  was 86% on room air before nebulizer treatment.  GENERAL:  She was awake, alert, and oriented and in mild respiratory  distress.  HEENT:  Head is normocephalic and atraumatic.  Eyes; pupils equal,  round,  and reactive to light.  Tympanic membranes are intact.  Oropharynx is clear.  NECK:  Supple, no JVD.  HEART:  Positive S1 and S2 with decreased heart sounds secondary to  wheezing.  LUNGS:  Decreased breath sounds with wheezing and rhonchi scattered.  Very  little improvement after nebulizer treatment with Xopenex.  Pulse oximetry  after nebulizer treatment was 88%.  ABDOMEN:  Soft and nontender.   ASSESSMENT:  Exacerbation of asthma.  We will admit to [facility]  for IV antibiotics and steroids and pulmonary consultation.  Please see  admission orders.      Lelon Perla, M.D.     Deborah Watts  D:  02/20/2005  T:  02/21/2005  Job:  [id number]

## 2010-08-14 ENCOUNTER — Ambulatory Visit (INDEPENDENT_AMBULATORY_CARE_PROVIDER_SITE_OTHER): Payer: 59 | Admitting: Internal Medicine

## 2010-08-14 ENCOUNTER — Encounter: Payer: Self-pay | Admitting: Internal Medicine

## 2010-08-14 VITALS — BP 128/86 | HR 92 | Temp 97.9°F | Wt 206.8 lb

## 2010-08-14 DIAGNOSIS — J45909 Unspecified asthma, uncomplicated: Secondary | ICD-10-CM

## 2010-08-14 MED ORDER — MOMETASONE FURO-FORMOTEROL FUM 200-5 MCG/ACT IN AERO: INHALATION_SPRAY | RESPIRATORY_TRACT | Status: DC

## 2010-08-14 MED ORDER — AZITHROMYCIN 250 MG PO TABS: ORAL_TABLET | ORAL | Status: AC

## 2010-08-14 MED ORDER — PREDNISONE 10 MG PO TABS: ORAL_TABLET | ORAL | Status: AC

## 2010-08-14 NOTE — Patient Instructions (Signed)
Rest, fluids, Mucinex DM Prednisone for a few days Zithromax for 5 days Dulera: Twice a day, use the samples, take advantage of the discount card You also need the albuterol, use the Ventolin sample  Call anytime if your symptoms of severe, come back and see your primary doctor in 3 weeks.

## 2010-08-14 NOTE — Progress Notes (Signed)
  Subjective:    Patient ID: Deborah Watts, female    DOB: April 03, 1965, 45 y.o.   MRN: [id number]  HPI Symptoms started 3 days ago, cough, chest congestion, green sputum. She feels fatigue as well. Using albuterol several times a day with minimal relief.  Past Medical History  Diagnosis Date  . Asthma   . Anxiety   . MHA (microangiopathic hemolytic anemia)   . Hypertension    No past surgical history on file.   Review of Systems No fever No sore throat Some nausea with cough but otherwise no vomiting or diarrhea. She feels chest pressure since his symptoms are started. Sinuses are mildly congestion , no nasal discharge    Objective:   Physical Exam  Constitutional: She is oriented to person, place, and time. She appears well-developed. No distress.  HENT:  Head: Normocephalic and atraumatic.  Right Ear: External ear normal.  Left Ear: External ear normal.  Mouth/Throat: No oropharyngeal exudate.       Face symmetric, not tender to palpation at the sinuses  Cardiovascular: Normal rate, regular rhythm and normal heart sounds.   No murmur heard. Pulmonary/Chest:       No increased work of breathing, mild rhonchi and wheezes bilaterally. Good air movement  Musculoskeletal: She exhibits no edema.  Neurological: She is alert and oriented to person, place, and time.  Skin: She is not diaphoretic.          Assessment & Plan:

## 2010-08-14 NOTE — Assessment & Plan Note (Signed)
Here with an acute asthma exacerbation, likely from bronchitis. On further questioning, between episodes of asthma exacerbation she needs albuterol daily. She was prescribed dulera but couldn't afford ; plan: see instructions

## 2010-09-02 ENCOUNTER — Other Ambulatory Visit: Payer: Self-pay

## 2010-09-02 MED ORDER — MONTELUKAST SODIUM 10 MG PO TABS: 10.0000 mg | ORAL_TABLET | Freq: Every day | ORAL | Status: DC

## 2010-09-09 ENCOUNTER — Other Ambulatory Visit: Payer: Self-pay | Admitting: Family Medicine

## 2010-09-09 MED ORDER — ALBUTEROL SULFATE HFA 108 (90 BASE) MCG/ACT IN AERS: 2.0000 | INHALATION_SPRAY | Freq: Four times a day (QID) | RESPIRATORY_TRACT | Status: DC | PRN

## 2010-09-09 NOTE — Telephone Encounter (Signed)
Rx faxed.    KP 

## 2010-09-15 ENCOUNTER — Other Ambulatory Visit: Payer: Self-pay | Admitting: Family Medicine

## 2010-10-01 ENCOUNTER — Other Ambulatory Visit: Payer: Self-pay | Admitting: Family Medicine

## 2010-10-01 MED ORDER — ALPRAZOLAM 0.5 MG PO TABS: 0.5000 mg | ORAL_TABLET | Freq: Three times a day (TID) | ORAL | Status: DC | PRN

## 2010-10-01 NOTE — Telephone Encounter (Signed)
Last seen 08/14/10 and filled 06/28/10 please advise    KP

## 2010-10-02 NOTE — Telephone Encounter (Signed)
Faxed to [facility] [phone]    KP

## 2010-11-14 ENCOUNTER — Other Ambulatory Visit: Payer: Self-pay | Admitting: Family Medicine

## 2010-11-14 MED ORDER — ALPRAZOLAM 0.5 MG PO TABS: 0.5000 mg | ORAL_TABLET | Freq: Three times a day (TID) | ORAL | Status: DC | PRN

## 2010-11-14 NOTE — Telephone Encounter (Signed)
Faxed.   KP 

## 2010-11-14 NOTE — Telephone Encounter (Signed)
Last seen 08/14/10 and filled 10/01/10 also requesting Diflucan # 2 please advise     KP

## 2010-11-22 ENCOUNTER — Other Ambulatory Visit: Payer: Self-pay | Admitting: Family Medicine

## 2010-11-22 MED ORDER — ALBUTEROL SULFATE HFA 108 (90 BASE) MCG/ACT IN AERS: 2.0000 | INHALATION_SPRAY | Freq: Four times a day (QID) | RESPIRATORY_TRACT | Status: DC | PRN

## 2010-11-22 NOTE — Telephone Encounter (Signed)
Rx faxed.    KP 

## 2010-12-18 ENCOUNTER — Other Ambulatory Visit: Payer: Self-pay | Admitting: Family Medicine

## 2010-12-19 NOTE — Telephone Encounter (Signed)
Last seen 08/14/10 and filled 09/15/10 please advise    KP

## 2010-12-25 ENCOUNTER — Other Ambulatory Visit: Payer: Self-pay | Admitting: Family Medicine

## 2010-12-26 MED ORDER — ALPRAZOLAM 0.5 MG PO TABS: 0.5000 mg | ORAL_TABLET | Freq: Three times a day (TID) | ORAL | Status: DC | PRN

## 2010-12-26 NOTE — Telephone Encounter (Signed)
Last seen 08/14/10 and filled 11/14/10     please advise    KP

## 2010-12-26 NOTE — Telephone Encounter (Signed)
Faxed     Kp 

## 2011-01-05 ENCOUNTER — Other Ambulatory Visit: Payer: Self-pay | Admitting: Family Medicine

## 2011-01-17 ENCOUNTER — Other Ambulatory Visit: Payer: Self-pay | Admitting: Family Medicine

## 2011-01-20 ENCOUNTER — Ambulatory Visit (INDEPENDENT_AMBULATORY_CARE_PROVIDER_SITE_OTHER): Payer: 59 | Admitting: Family Medicine

## 2011-01-20 ENCOUNTER — Encounter: Payer: Self-pay | Admitting: Family Medicine

## 2011-01-20 VITALS — BP 114/76 | HR 88 | Temp 97.6°F | Wt 211.0 lb

## 2011-01-20 DIAGNOSIS — G43909 Migraine, unspecified, not intractable, without status migrainosus: Secondary | ICD-10-CM

## 2011-01-20 DIAGNOSIS — J329 Chronic sinusitis, unspecified: Secondary | ICD-10-CM

## 2011-01-20 DIAGNOSIS — J019 Acute sinusitis, unspecified: Secondary | ICD-10-CM

## 2011-01-20 MED ORDER — CLARITHROMYCIN ER 500 MG PO TB24: ORAL_TABLET | ORAL | Status: DC

## 2011-01-20 MED ORDER — SUMATRIPTAN SUCCINATE 100 MG PO TABS: ORAL_TABLET | ORAL | Status: DC

## 2011-01-20 MED ORDER — MOMETASONE FUROATE 50 MCG/ACT NA SUSP: NASAL | Status: DC

## 2011-01-20 NOTE — Progress Notes (Signed)
  Subjective:     Deborah Watts is a 45 y.o. female who presents for evaluation of sinus pain. Symptoms include: congestion, facial pain, headaches, nasal congestion and sinus pressure. Onset of symptoms was 3 weeks ago. Symptoms have been gradually worsening since that time. Past history is significant for asthma. Patient is a non-smoker.  The following portions of the patient's history were reviewed and updated as appropriate: allergies, current medications, past family history, past medical history, past social history, past surgical history and problem list.  Review of Systems Pertinent items are noted in HPI.   Objective:    BP 114/76  Pulse 88  Temp(Src) 97.6 F (36.4 C) (Oral)  Wt 211 lb (95.709 kg)  SpO2 97% General appearance: alert, cooperative, appears stated age and no distress Ears: normal TM's and external ear canals both ears Nose: green discharge, moderate congestion, sinus tenderness bilateral Throat: lips, mucosa, and tongue normal; teeth and gums normal Neck: no adenopathy, no carotid bruit, no JVD, supple, symmetrical, trachea midline and thyroid not enlarged, symmetric, no tenderness/mass/nodules Lungs: clear to auscultation bilaterally Heart: regular rate and rhythm, S1, S2 normal, no murmur, click, rub or gallop Extremities: extremities normal, atraumatic, no cyanosis or edema    Assessment:    Acute bacterial sinusitis.  Hx migraines---on immitrex  Plan:    Neti pot recommended. Instructions given. Nasal steroids per medication orders. Antihistamines per medication orders. Biaxin per medication orders. Follow up in 2 weeks or as needed.

## 2011-01-20 NOTE — Patient Instructions (Signed)

## 2011-01-23 ENCOUNTER — Telehealth: Payer: Self-pay | Admitting: *Deleted

## 2011-01-23 NOTE — Telephone Encounter (Signed)
Prior Auth approved from 01-01-11 until 01-21-13, pharmacy notified via fax, approval letter scan to chart.

## 2011-02-05 ENCOUNTER — Other Ambulatory Visit: Payer: Self-pay | Admitting: Family Medicine

## 2011-02-05 MED ORDER — ALBUTEROL SULFATE HFA 108 (90 BASE) MCG/ACT IN AERS: 2.0000 | INHALATION_SPRAY | Freq: Four times a day (QID) | RESPIRATORY_TRACT | Status: DC | PRN

## 2011-02-05 NOTE — Telephone Encounter (Signed)
Faxed.   KP 

## 2011-02-07 ENCOUNTER — Other Ambulatory Visit: Payer: Self-pay | Admitting: Family Medicine

## 2011-02-07 MED ORDER — ALPRAZOLAM 0.5 MG PO TABS: 0.5000 mg | ORAL_TABLET | Freq: Three times a day (TID) | ORAL | Status: DC | PRN

## 2011-02-07 NOTE — Telephone Encounter (Signed)
#  90--no rf. 

## 2011-02-07 NOTE — Telephone Encounter (Signed)
RX called in .

## 2011-02-07 NOTE — Telephone Encounter (Signed)
?   [place] pt. Do you need me to review or was it a routing mistake?

## 2011-02-07 NOTE — Telephone Encounter (Signed)
Please advise on refill request, last filled 12/26/10 #90/0, last OV 01/20/11

## 2011-02-10 ENCOUNTER — Other Ambulatory Visit: Payer: Self-pay

## 2011-02-10 MED ORDER — MONTELUKAST SODIUM 10 MG PO TABS: 10.0000 mg | ORAL_TABLET | Freq: Every day | ORAL | Status: DC

## 2011-03-04 ENCOUNTER — Ambulatory Visit (INDEPENDENT_AMBULATORY_CARE_PROVIDER_SITE_OTHER): Payer: 59 | Admitting: Family

## 2011-03-04 ENCOUNTER — Encounter: Payer: Self-pay | Admitting: Family

## 2011-03-04 DIAGNOSIS — J209 Acute bronchitis, unspecified: Secondary | ICD-10-CM

## 2011-03-04 DIAGNOSIS — B370 Candidal stomatitis: Secondary | ICD-10-CM

## 2011-03-04 MED ORDER — AZITHROMYCIN 250 MG PO TABS: ORAL_TABLET | ORAL | Status: AC

## 2011-03-04 MED ORDER — PREDNISONE 10 MG PO TABS: ORAL_TABLET | ORAL | Status: DC

## 2011-03-04 MED ORDER — BENZONATATE 200 MG PO CAPS: 200.0000 mg | ORAL_CAPSULE | Freq: Three times a day (TID) | ORAL | Status: DC | PRN

## 2011-03-04 MED ORDER — NYSTATIN 100000 UNIT/ML MT SUSP: 500000.0000 [IU] | Freq: Four times a day (QID) | OROMUCOSAL | Status: AC

## 2011-03-04 NOTE — Assessment & Plan Note (Signed)
Not overtly wheezing today, but she does complain of worsening asthma symptoms despite use of her dulera, rescue inhaler and singulair. Will plan to treat with pred taper, zithromax, tessalon PRN.

## 2011-03-04 NOTE — Assessment & Plan Note (Addendum)
Will plan to treat with nystatin. Likely due to [facility]. I have asked her to rinse/gargle after each [facility] use.

## 2011-03-04 NOTE — Progress Notes (Signed)
Subjective:    Patient ID: Deborah Watts, female    DOB: 11-18-1965, 45 y.o.   MRN: [id number]  HPI  [name] is a 45 yr old female who presents today with chief complaint of cough. Has associated nasal congestion and ear pressure.  Now feels chest tightness which is not being relieved by her rescue inhaler.  Symptoms started about 4 days ago.  She denies known fever.  Symptoms are worsening.      Review of Systems Past Medical History  Diagnosis Date  . Asthma   . Anxiety   . MHA (microangiopathic hemolytic anemia)   . Hypertension     History   Social History  . Marital Status: Married    Spouse Name: N/A    Number of Children: N/A  . Years of Education: N/A   Occupational History  . Not on file.   Social History Main Topics  . Smoking status: Never Smoker   . Smokeless tobacco: Not on file  . Alcohol Use: Not on file  . Drug Use: No  . Sexually Active: Not on file   Other Topics Concern  . Not on file   Social History Narrative  . No narrative on file    No past surgical history on file.  Family History  Problem Relation Age of Onset  . Asthma    . Diabetes    . Hyperlipidemia    . Throat cancer Father     Allergies  Allergen Reactions  . Iohexol      Desc: hives, sob, pt. needs 13 hr prep   01/16/05   . Ivp Dye (Iodinated Diagnostic Agents) Hives and Swelling  . Penicillins     Current Outpatient Prescriptions on File Prior to Visit  Medication Sig Dispense Refill  . albuterol (PROAIR HFA) 108 (90 BASE) MCG/ACT inhaler Inhale 2 puffs into the lungs every 6 (six) hours as needed for wheezing.  1 Inhaler  1  . ALPRAZolam (XANAX) 0.5 MG tablet Take 1 tablet (0.5 mg total) by mouth 3 (three) times daily as needed for sleep or anxiety.  90 tablet  0  . cetirizine (ZYRTEC) 10 MG tablet Take 10 mg by mouth daily.        Marland Kitchen lisinopril-hydrochlorothiazide (PRINZIDE,ZESTORETIC) 10-12.5 MG per tablet TAKE 1 TABLET ONCE DAILY  90 tablet  0  . mometasone  (NASONEX) 50 MCG/ACT nasal spray 2 sprays each nostril qd  17 g  5  . Mometasone Furo-Formoterol Fum (DULERA) 200-5 MCG/ACT AERO 1 puff twice a day  1 Inhaler  3  . montelukast (SINGULAIR) 10 MG tablet Take 1 tablet (10 mg total) by mouth at bedtime.  90 tablet  1  . ondansetron (ZOFRAN) 8 MG tablet Take 8 mg by mouth every 8 (eight) hours as needed.        . potassium chloride (KLOR-CON) 10 MEQ CR tablet Take 10 mEq by mouth 2 (two) times daily.        . SUMAtriptan (IMITREX) 100 MG tablet As directed  12 tablet  0  . benzonatate (TESSALON) 200 MG capsule Take 200 mg by mouth 3 (three) times daily as needed.          BP 110/60  Pulse 84  Temp(Src) 98 F (36.7 C) (Oral)  Resp 16  Wt 213 lb (96.616 kg)  SpO2 96%       Objective:   Physical Exam  Constitutional: She appears well-developed and well-nourished. No distress.  HENT:  Head:  Normocephalic and atraumatic.       White coating noted on tongue.  Cardiovascular: Normal rate and regular rhythm.   No murmur heard. Pulmonary/Chest: Effort normal and breath sounds normal. No respiratory distress. She has no wheezes. She has no rales. She exhibits no tenderness.  Musculoskeletal: She exhibits no edema.  Psychiatric: She has a normal mood and affect. Her behavior is normal. Judgment and thought content normal.          Assessment & Plan:

## 2011-03-04 NOTE — Patient Instructions (Signed)
Call if your symptoms worsen, or if no improvement in 2-3 days.

## 2011-03-06 ENCOUNTER — Telehealth: Payer: Self-pay | Admitting: Family Medicine

## 2011-03-06 NOTE — Telephone Encounter (Signed)
In to see Melissa on Tuesday.  Melissa said if she did not feel much better by today to call back for an rx.  The z pack is not helping she would like biaxin

## 2011-03-06 NOTE — Telephone Encounter (Signed)
Patient made appt with [facility] for tomorrow morning.

## 2011-03-06 NOTE — Telephone Encounter (Signed)
Please advise 

## 2011-03-06 NOTE — Telephone Encounter (Signed)
I think she should be re-evaluated in the office.  She may need a chest xray.  Maybe GJ can see her this afternoon.  If not, I can see her in the AM.

## 2011-03-07 ENCOUNTER — Ambulatory Visit (INDEPENDENT_AMBULATORY_CARE_PROVIDER_SITE_OTHER): Payer: 59 | Admitting: Family

## 2011-03-07 ENCOUNTER — Encounter: Payer: Self-pay | Admitting: Family

## 2011-03-07 ENCOUNTER — Telehealth: Payer: Self-pay | Admitting: Family

## 2011-03-07 ENCOUNTER — Ambulatory Visit (HOSPITAL_BASED_OUTPATIENT_CLINIC_OR_DEPARTMENT_OTHER)
Admission: RE | Admit: 2011-03-07 | Discharge: 2011-03-07 | Disposition: A | Discharge status: Home / Self Care | Payer: 59 | Source: Ambulatory Visit | Attending: Family | Admitting: Family

## 2011-03-07 VITALS — BP 110/80 | HR 106 | Temp 99.4°F | Resp 20 | Wt 213.0 lb

## 2011-03-07 DIAGNOSIS — R509 Fever, unspecified: Secondary | ICD-10-CM | POA: Insufficient documentation

## 2011-03-07 DIAGNOSIS — R059 Cough, unspecified: Secondary | ICD-10-CM | POA: Insufficient documentation

## 2011-03-07 DIAGNOSIS — J069 Acute upper respiratory infection, unspecified: Secondary | ICD-10-CM

## 2011-03-07 DIAGNOSIS — J111 Influenza due to unidentified influenza virus with other respiratory manifestations: Secondary | ICD-10-CM

## 2011-03-07 DIAGNOSIS — J209 Acute bronchitis, unspecified: Secondary | ICD-10-CM

## 2011-03-07 DIAGNOSIS — R05 Cough: Secondary | ICD-10-CM

## 2011-03-07 DIAGNOSIS — B370 Candidal stomatitis: Secondary | ICD-10-CM

## 2011-03-07 MED ORDER — LEVOFLOXACIN 750 MG PO TABS: 750.0000 mg | ORAL_TABLET | Freq: Every day | ORAL | Status: AC

## 2011-03-07 NOTE — Patient Instructions (Signed)
Please complete your chest x-ray on the first floor.  We will call you with your results.

## 2011-03-07 NOTE — Telephone Encounter (Signed)
Spoke with pt. Discussed neg chest x-ray and plans for her to switch to Levaquin. Continue pred taper, inhalers.  Follow up with Dr. Laury Axon toward the end of next week, sooner if symptoms worsen or if they do not improve. She verbalizes understanding.

## 2011-03-07 NOTE — Progress Notes (Signed)
Subjective:    Patient ID: Deborah Watts, female    DOB: [birth year], 45 y.o.   MRN: [id number]  HPI  Ms.  Watts is a 45 yr old female who presents today for follow up of her bronchitis.  She was seen on 12/18 and was treated with zithromax.  She reports worsening of her cough. Head "filled up" has been doing nothing but running.  Has pain in her teeth.  She reports muscle soreness from coughing.  She reports that her co-workers are complaining about her cough.      Review of Systems See HPI  Past Medical History  Diagnosis Date  . Asthma   . Anxiety   . MHA (microangiopathic hemolytic anemia)   . Hypertension     History   Social History  . Marital Status: Married    Spouse Name: N/A    Number of Children: N/A  . Years of Education: N/A   Occupational History  . Not on file.   Social History Main Topics  . Smoking status: Never Smoker   . Smokeless tobacco: Never Used  . Alcohol Use: Not on file  . Drug Use: No  . Sexually Active: Not on file   Other Topics Concern  . Not on file   Social History Narrative  . No narrative on file    No past surgical history on file.  Family History  Problem Relation Age of Onset  . Asthma    . Diabetes    . Hyperlipidemia    . Throat cancer Father     Allergies  Allergen Reactions  . Iohexol      Desc: hives, sob, pt. needs 13 hr prep   01/16/05   . Ivp Dye (Iodinated Diagnostic Agents) Hives and Swelling  . Penicillins     Current Outpatient Prescriptions on File Prior to Visit  Medication Sig Dispense Refill  . albuterol (PROAIR HFA) 108 (90 BASE) MCG/ACT inhaler Inhale 2 puffs into the lungs every 6 (six) hours as needed for wheezing.  1 Inhaler  1  . ALPRAZolam (XANAX) 0.5 MG tablet Take 1 tablet (0.5 mg total) by mouth 3 (three) times daily as needed for sleep or anxiety.  90 tablet  0  . azithromycin (ZITHROMAX Z-PAK) 250 MG tablet 2 tabs by mouth today, then one tablet by mouth daily for 4 more days.  6 each   0  . benzonatate (TESSALON) 200 MG capsule Take 1 capsule (200 mg total) by mouth 3 (three) times daily as needed.  20 capsule  0  . cetirizine (ZYRTEC) 10 MG tablet Take 10 mg by mouth daily.        Marland Kitchen lisinopril-hydrochlorothiazide (PRINZIDE,ZESTORETIC) 10-12.5 MG per tablet TAKE 1 TABLET ONCE DAILY  90 tablet  0  . mometasone (NASONEX) 50 MCG/ACT nasal spray 2 sprays each nostril qd  17 g  5  . Mometasone Furo-Formoterol Fum (DULERA) 200-5 MCG/ACT AERO 1 puff twice a day  1 Inhaler  3  . montelukast (SINGULAIR) 10 MG tablet Take 1 tablet (10 mg total) by mouth at bedtime.  90 tablet  1  . nystatin (MYCOSTATIN) 100000 UNIT/ML suspension Take 5 mLs (500,000 Units total) by mouth 4 (four) times daily.  120 mL  0  . potassium chloride (KLOR-CON) 10 MEQ CR tablet Take 10 mEq by mouth 2 (two) times daily.        . predniSONE (DELTASONE) 10 MG tablet 4 tabs once daily x 2 days, then 3 tabs  daily x 2 days, 2 tabs daily x 2 days, 1 tab daily for 2 days then stop.  20 tablet  0  . SUMAtriptan (IMITREX) 100 MG tablet As directed  12 tablet  0    BP 110/80  Pulse 106  Temp(Src) 99.4 F (37.4 C) (Oral)  Resp 20  Wt 213 lb (96.616 kg)  SpO2 94%       Objective:   Physical Exam  Constitutional: She appears well-developed and well-nourished. No distress.  HENT:  Head: Normocephalic and atraumatic.  Right Ear: Tympanic membrane and ear canal normal.  Left Ear: Tympanic membrane and ear canal normal.  Mouth/Throat: No posterior oropharyngeal edema.       White coating on tongue is improved since last visit.  Cardiovascular: Normal rate and regular rhythm.   No murmur heard. Pulmonary/Chest: Effort normal. No respiratory distress. She has wheezes. She has no rales.          Assessment & Plan:

## 2011-03-10 DIAGNOSIS — J111 Influenza due to unidentified influenza virus with other respiratory manifestations: Secondary | ICD-10-CM | POA: Insufficient documentation

## 2011-03-10 NOTE — Assessment & Plan Note (Signed)
Improving with nystatin. Continue same.

## 2011-03-10 NOTE — Assessment & Plan Note (Signed)
Pt is outside the window for tamiflu. Plan to treat asthmatic bronchitis as outlined.

## 2011-03-10 NOTE — Assessment & Plan Note (Signed)
chest x-ray performed today and is negative. Plans for her to switch to Levaquin. Continue pred taper, inhalers. Follow up with Dr. Laury Axon toward the end of next week, sooner if symptoms worsen or if they do not improve.

## 2011-03-19 ENCOUNTER — Other Ambulatory Visit: Payer: Self-pay | Admitting: Family Medicine

## 2011-03-19 DIAGNOSIS — F419 Anxiety disorder, unspecified: Secondary | ICD-10-CM

## 2011-03-19 NOTE — Telephone Encounter (Signed)
Last seen 01/20/11 and filled 02/07/11 # 90. Please advise     KP

## 2011-03-20 MED ORDER — ALPRAZOLAM 0.5 MG PO TABS: 0.5000 mg | ORAL_TABLET | Freq: Three times a day (TID) | ORAL | Status: DC | PRN

## 2011-03-21 ENCOUNTER — Telehealth: Payer: Self-pay

## 2011-03-21 NOTE — Telephone Encounter (Signed)
Msg from patient and she stated she has had a migraine all day and she took her two Imitrex without any relief. She is having some blurred vision, but say this normally happens when she has a migraine. Per Dr. Laury Axon we can not prescribe anything else without her being seen. She has been advised to go to the ED or UC for an evaluation and she agreed.    KP

## 2011-03-25 ENCOUNTER — Other Ambulatory Visit: Payer: Self-pay | Admitting: Family Medicine

## 2011-03-25 DIAGNOSIS — G43909 Migraine, unspecified, not intractable, without status migrainosus: Secondary | ICD-10-CM

## 2011-03-25 MED ORDER — SUMATRIPTAN SUCCINATE 100 MG PO TABS: ORAL_TABLET | ORAL | Status: DC

## 2011-03-25 NOTE — Telephone Encounter (Signed)
Rx faxed.    KP 

## 2011-04-02 ENCOUNTER — Encounter: Payer: Self-pay | Admitting: Family Medicine

## 2011-04-02 ENCOUNTER — Ambulatory Visit (INDEPENDENT_AMBULATORY_CARE_PROVIDER_SITE_OTHER): Payer: 59 | Admitting: Family Medicine

## 2011-04-02 VITALS — BP 118/78 | HR 87 | Temp 98.6°F | Wt 217.0 lb

## 2011-04-02 DIAGNOSIS — J40 Bronchitis, not specified as acute or chronic: Secondary | ICD-10-CM

## 2011-04-02 DIAGNOSIS — R51 Headache: Secondary | ICD-10-CM

## 2011-04-02 MED ORDER — LISINOPRIL-HYDROCHLOROTHIAZIDE 10-12.5 MG PO TABS: 1.0000 | ORAL_TABLET | Freq: Every day | ORAL | Status: DC

## 2011-04-02 MED ORDER — PREDNISONE 10 MG PO TABS: ORAL_TABLET | ORAL | Status: DC

## 2011-04-02 MED ORDER — BENZONATATE 100 MG PO CAPS: 100.0000 mg | ORAL_CAPSULE | Freq: Two times a day (BID) | ORAL | Status: AC | PRN

## 2011-04-02 MED ORDER — METHYLPREDNISOLONE ACETATE 80 MG/ML IJ SUSP
80.0000 mg | Freq: Once | INTRAMUSCULAR | Status: AC
  Administered 2011-04-02: 80 mg via INTRAMUSCULAR

## 2011-04-02 MED ORDER — METHYLPREDNISOLONE ACETATE 80 MG/ML IJ SUSP: 80.0000 mg | Freq: Once | INTRAMUSCULAR | Status: DC

## 2011-04-02 MED ORDER — TRAMADOL HCL 50 MG PO TABS: 50.0000 mg | ORAL_TABLET | Freq: Three times a day (TID) | ORAL | Status: DC | PRN

## 2011-04-02 MED ORDER — CLARITHROMYCIN ER 500 MG PO TB24: 1000.0000 mg | ORAL_TABLET | Freq: Every day | ORAL | Status: AC

## 2011-04-02 MED ORDER — MONTELUKAST SODIUM 10 MG PO TABS: 10.0000 mg | ORAL_TABLET | Freq: Every day | ORAL | Status: DC

## 2011-04-02 NOTE — Patient Instructions (Signed)

## 2011-04-02 NOTE — Progress Notes (Signed)
  Subjective:     Deborah Watts is a 46 y.o. female here for evaluation of a cough. Onset of symptoms was 1 month ago. Symptoms have been gradually worsening since that time. The cough is productive and is aggravated by exercise and reclining position. Associated symptoms include: shortness of breath, sputum production and wheezing. Patient does have a history of asthma. Patient does have a history of environmental allergens. Patient has traveled recently. Patient does not have a history of smoking. Patient has had a previous chest x-ray. Patient has not had a PPD done.  The following portions of the patient's history were reviewed and updated as appropriate: allergies, current medications, past family history, past medical history, past social history, past surgical history and problem list.  Review of Systems Pertinent items are noted in HPI.    Objective:    Oxygen saturation 95% on room air BP 118/78  Pulse 87  Temp(Src) 98.6 F (37 C) (Oral)  Wt 217 lb (98.431 kg)  SpO2 95% General appearance: alert, cooperative, appears stated age and no distress Ears: normal TM's and external ear canals both ears Nose: green discharge, moderate congestion, sinus tenderness bilateral Throat: lips, mucosa, and tongue normal; teeth and gums normal Neck: no adenopathy, supple, symmetrical, trachea midline and thyroid not enlarged, symmetric, no tenderness/mass/nodules Lungs: clear to auscultation bilaterally Heart: regular rate and rhythm, S1, S2 normal, no murmur, click, rub or gallop    Assessment:    Acute Bronchitis    Plan:    Antibiotics per medication orders. Antitussives per medication orders. Avoid exposure to tobacco smoke and fumes. B-agonist inhaler. Call if shortness of breath worsens, blood in sputum, change in character of cough, development of fever or chills, inability to maintain nutrition and hydration. Avoid exposure to tobacco smoke and fumes.

## 2011-04-05 ENCOUNTER — Other Ambulatory Visit: Payer: Self-pay | Admitting: Family Medicine

## 2011-04-10 ENCOUNTER — Telehealth: Payer: Self-pay | Admitting: *Deleted

## 2011-04-10 NOTE — Telephone Encounter (Signed)
PA resubmitted with new info.

## 2011-04-10 NOTE — Telephone Encounter (Signed)
She is on an inhaled steroid---dulera

## 2011-04-10 NOTE — Telephone Encounter (Signed)
Prior Auth denied for the following reason: current plan approved criteria does not allow coverage of Singulair if it will not be used as add-on therapy in conjunction with an orally inhaled corticosteroid. Marland KitchenPlease advise   Please faxed appeal info to Prescription Claim Appeals [phone], [facility], [id number] [place] [address] Fax [phone]

## 2011-04-14 ENCOUNTER — Telehealth: Payer: Self-pay | Admitting: Family Medicine

## 2011-04-14 NOTE — Telephone Encounter (Signed)
Prior Auth approved 04-11-11-04-10-14, approval letter scan to chart.

## 2011-04-14 NOTE — Telephone Encounter (Signed)
Patient states that her coupon card for dulera has expired and was wanting to know if she could have another one?

## 2011-04-14 NOTE — Telephone Encounter (Signed)
Pt aware card placed up front for pick up.

## 2011-04-28 ENCOUNTER — Other Ambulatory Visit: Payer: Self-pay | Admitting: Family Medicine

## 2011-04-28 DIAGNOSIS — F419 Anxiety disorder, unspecified: Secondary | ICD-10-CM

## 2011-04-28 MED ORDER — ALPRAZOLAM 0.5 MG PO TABS: 0.5000 mg | ORAL_TABLET | Freq: Three times a day (TID) | ORAL | Status: DC | PRN

## 2011-04-28 NOTE — Telephone Encounter (Signed)
Last seen 04/02/11 and filled 03/19/11 # 90 please advise   KP

## 2011-04-30 ENCOUNTER — Other Ambulatory Visit: Payer: Self-pay | Admitting: Family Medicine

## 2011-04-30 MED ORDER — ALBUTEROL SULFATE HFA 108 (90 BASE) MCG/ACT IN AERS: 2.0000 | INHALATION_SPRAY | Freq: Four times a day (QID) | RESPIRATORY_TRACT | Status: DC | PRN

## 2011-04-30 NOTE — Telephone Encounter (Signed)
Spoke with [facility] and they received the Xanax yesterday--Albuterol sent.      KP

## 2011-05-02 ENCOUNTER — Other Ambulatory Visit: Payer: Self-pay | Admitting: Family Medicine

## 2011-05-02 DIAGNOSIS — G43909 Migraine, unspecified, not intractable, without status migrainosus: Secondary | ICD-10-CM

## 2011-05-02 MED ORDER — SUMATRIPTAN SUCCINATE 100 MG PO TABS: ORAL_TABLET | ORAL | Status: DC

## 2011-05-02 NOTE — Telephone Encounter (Signed)
Faxed.   KP 

## 2011-05-07 ENCOUNTER — Other Ambulatory Visit: Payer: Self-pay | Admitting: Family Medicine

## 2011-05-07 MED ORDER — MOMETASONE FURO-FORMOTEROL FUM 200-5 MCG/ACT IN AERO: INHALATION_SPRAY | RESPIRATORY_TRACT | Status: DC

## 2011-05-07 NOTE — Telephone Encounter (Signed)
Refill done.  

## 2011-06-11 ENCOUNTER — Other Ambulatory Visit: Payer: Self-pay | Admitting: Family Medicine

## 2011-06-11 DIAGNOSIS — F419 Anxiety disorder, unspecified: Secondary | ICD-10-CM

## 2011-06-11 MED ORDER — ALPRAZOLAM 0.5 MG PO TABS: 0.5000 mg | ORAL_TABLET | Freq: Three times a day (TID) | ORAL | Status: DC | PRN

## 2011-06-11 NOTE — Telephone Encounter (Signed)
Refill for 90 Alprazolam 0.5mg  tab Last filled 2.21  Take 1-tablet by mouth 3 times daily as needed for sleep & anxiety

## 2011-06-11 NOTE — Telephone Encounter (Signed)
Last seen 04/02/11 and filled 04/28/11 # 90. Please advise    KP

## 2011-06-12 ENCOUNTER — Telehealth: Payer: Self-pay | Admitting: Family Medicine

## 2011-06-12 MED ORDER — ALPRAZOLAM 0.5 MG PO TABS: 0.5000 mg | ORAL_TABLET | Freq: Three times a day (TID) | ORAL | Status: DC | PRN

## 2011-06-12 NOTE — Telephone Encounter (Signed)
Pharmacy called & states they have not received the prescription for  [facility], that I think was sent this morning. Can you re-send? To [facility] again

## 2011-06-12 NOTE — Telephone Encounter (Signed)
Rx faxed.    KP 

## 2011-06-12 NOTE — Telephone Encounter (Signed)
Addended by: Arnette Norris on: 06/12/2011 10:56 AM   Modules accepted: Orders

## 2011-07-08 ENCOUNTER — Other Ambulatory Visit: Payer: Self-pay

## 2011-07-08 MED ORDER — POTASSIUM CHLORIDE ER 10 MEQ PO TBCR: 10.0000 meq | EXTENDED_RELEASE_TABLET | Freq: Two times a day (BID) | ORAL | Status: DC

## 2011-07-08 MED ORDER — LISINOPRIL-HYDROCHLOROTHIAZIDE 10-12.5 MG PO TABS: 1.0000 | ORAL_TABLET | Freq: Every day | ORAL | Status: DC

## 2011-07-09 ENCOUNTER — Other Ambulatory Visit: Payer: Self-pay | Admitting: Family Medicine

## 2011-07-09 DIAGNOSIS — F419 Anxiety disorder, unspecified: Secondary | ICD-10-CM

## 2011-07-09 MED ORDER — ALPRAZOLAM 0.5 MG PO TABS: 0.5000 mg | ORAL_TABLET | Freq: Three times a day (TID) | ORAL | Status: DC | PRN

## 2011-07-09 NOTE — Telephone Encounter (Signed)
She has been seen for acute visits by different providers. Okay to call 40 tablets, no RF Next office visit w/ PCP before next RF

## 2011-07-09 NOTE — Telephone Encounter (Signed)
Last seen 04/02/11 and filled 06/12/11 # 90. Please advise    KP

## 2011-07-09 NOTE — Telephone Encounter (Signed)
Refill done.  

## 2011-07-09 NOTE — Telephone Encounter (Signed)
Refill Alprazolam 0.5MG  Tab #90 Qty 90 Take one tablet by mouth three times daily as needed for sleep & Anxiety Last fille 3.28.13  Last OV 1.16.2013

## 2011-07-21 ENCOUNTER — Telehealth: Payer: Self-pay | Admitting: Family Medicine

## 2011-07-21 DIAGNOSIS — F419 Anxiety disorder, unspecified: Secondary | ICD-10-CM

## 2011-07-21 MED ORDER — ALPRAZOLAM 0.5 MG PO TABS: 0.5000 mg | ORAL_TABLET | Freq: Three times a day (TID) | ORAL | Status: DC | PRN

## 2011-07-21 NOTE — Telephone Encounter (Signed)
Refill: Alprazolam 0.5mg  tab #90. Take one tablet by mouth three times daily as needed for sleep or anxiety. Last fill 06-12-11

## 2011-07-21 NOTE — Telephone Encounter (Signed)
Ok to refill x 1  

## 2011-07-21 NOTE — Telephone Encounter (Signed)
Last filled 07/09/11 # 40--- patient normally gets 90 seen 04/02/11. Please advise     KP

## 2011-07-21 NOTE — Telephone Encounter (Signed)
Rx sent 

## 2011-07-21 NOTE — Telephone Encounter (Signed)
Addended by: Arnette Norris on: 07/21/2011 05:16 PM   Modules accepted: Orders

## 2011-09-03 ENCOUNTER — Telehealth: Payer: Self-pay | Admitting: Family Medicine

## 2011-09-03 DIAGNOSIS — F419 Anxiety disorder, unspecified: Secondary | ICD-10-CM

## 2011-09-03 MED ORDER — ALPRAZOLAM 0.5 MG PO TABS: 0.5000 mg | ORAL_TABLET | Freq: Three times a day (TID) | ORAL | Status: DC | PRN

## 2011-09-03 MED ORDER — MONTELUKAST SODIUM 10 MG PO TABS: 10.0000 mg | ORAL_TABLET | Freq: Every day | ORAL | Status: DC

## 2011-09-03 NOTE — Telephone Encounter (Signed)
Last seen 04/02/11 and filled 07/21/11. Please advise    KP

## 2011-09-03 NOTE — Telephone Encounter (Signed)
Ok to refill both--xanax x 1  singulair for 1 year

## 2011-09-03 NOTE — Telephone Encounter (Signed)
Refill: Alprazolam 0.5mg  #90. Take 1 tablet by mouth 3 times daily as needed for sleep or anxiety. Last fill 07-21-11 Singulair 10mg  tab #90. Take 1 tablet by mouth once daily.

## 2011-10-02 ENCOUNTER — Telehealth: Payer: Self-pay | Admitting: Family Medicine

## 2011-10-02 DIAGNOSIS — F419 Anxiety disorder, unspecified: Secondary | ICD-10-CM

## 2011-10-02 NOTE — Telephone Encounter (Signed)
Last seen 04/02/11 and filled 09/03/11 # 90. Please advise   KP

## 2011-10-02 NOTE — Telephone Encounter (Signed)
Refill Clotrimazole/Betamet1-0.05% topical crm # 45, apply twice daily Last ov 1.16.13-acute visit

## 2011-10-02 NOTE — Telephone Encounter (Signed)
Refill: Alprazolam 0.5mg  tab #90. Take 1 tablet by mouth 3 times daily as needed for sleep or anxiety. Last fill 09-04-11

## 2011-10-03 MED ORDER — ALPRAZOLAM 0.5 MG PO TABS: 0.5000 mg | ORAL_TABLET | Freq: Three times a day (TID) | ORAL | Status: DC | PRN

## 2011-10-03 MED ORDER — CLOTRIMAZOLE-BETAMETHASONE 1-0.05 % EX CREA: TOPICAL_CREAM | Freq: Two times a day (BID) | CUTANEOUS | Status: DC

## 2011-10-03 NOTE — Telephone Encounter (Signed)
Refill x1   2 refills 

## 2011-10-20 ENCOUNTER — Ambulatory Visit (INDEPENDENT_AMBULATORY_CARE_PROVIDER_SITE_OTHER): Payer: 59 | Admitting: Family Medicine

## 2011-10-20 ENCOUNTER — Encounter: Payer: Self-pay | Admitting: Family Medicine

## 2011-10-20 VITALS — BP 118/76 | HR 72 | Temp 98.3°F | Wt 209.6 lb

## 2011-10-20 DIAGNOSIS — J329 Chronic sinusitis, unspecified: Secondary | ICD-10-CM

## 2011-10-20 DIAGNOSIS — J40 Bronchitis, not specified as acute or chronic: Secondary | ICD-10-CM

## 2011-10-20 MED ORDER — PREDNISONE 10 MG PO TABS: ORAL_TABLET | ORAL | Status: DC

## 2011-10-20 MED ORDER — CLARITHROMYCIN ER 500 MG PO TB24: 1000.0000 mg | ORAL_TABLET | Freq: Every day | ORAL | Status: DC

## 2011-10-20 NOTE — Patient Instructions (Signed)

## 2011-10-20 NOTE — Progress Notes (Signed)
  Subjective:     TAJI SATHER is a 46 y.o. female who presents for evaluation of sinus pain. Symptoms include: congestion, cough, facial pain, headaches, nasal congestion, sinus pressure and wheezing. Onset of symptoms was 1 week ago. Symptoms have been gradually worsening since that time. Past history is significant for asthma and occasional episodes of bronchitis. Patient is a non-smoker.  The following portions of the patient's history were reviewed and updated as appropriate: allergies, current medications, past family history, past medical history, past social history, past surgical history and problem list.  Review of Systems Pertinent items are noted in HPI.   Objective:    BP 118/76  Pulse 72  Temp 98.3 F (36.8 C) (Oral)  Wt 209 lb 9.6 oz (95.074 kg)  SpO2 97% General appearance: alert, cooperative, appears stated age and no distress Ears: normal TM's and external ear canals both ears Nose: green discharge, moderate congestion, sinus tenderness bilateral Throat: lips, mucosa, and tongue normal; teeth and gums normal Neck: no adenopathy, supple, symmetrical, trachea midline and thyroid not enlarged, symmetric, no tenderness/mass/nodules Lungs: diminished breath sounds bilaterally    Assessment:    Acute bacterial sinusitis.   Bronchitis Plan:    Nasal steroids per medication orders. Antihistamines per medication orders. Biaxin per medication orders. f/u prn  pred taper con't inhalers

## 2011-10-27 ENCOUNTER — Telehealth: Payer: Self-pay | Admitting: Family Medicine

## 2011-11-14 ENCOUNTER — Other Ambulatory Visit: Payer: Self-pay | Admitting: Family Medicine

## 2011-11-14 DIAGNOSIS — G43909 Migraine, unspecified, not intractable, without status migrainosus: Secondary | ICD-10-CM

## 2011-11-14 MED ORDER — SUMATRIPTAN SUCCINATE 100 MG PO TABS: ORAL_TABLET | ORAL | Status: DC

## 2011-11-14 NOTE — Telephone Encounter (Signed)
Refill SUMAtriptan Succinate (Tab) IMITREX 100 MG Take 1 tablet as need for Migraine and repeat in 2 hours prn #12, last ov 8.5.13 acute Last fill 06.13.13

## 2011-12-03 NOTE — Telephone Encounter (Signed)
No comment

## 2011-12-12 ENCOUNTER — Other Ambulatory Visit: Payer: Self-pay | Admitting: Family Medicine

## 2011-12-12 MED ORDER — LISINOPRIL-HYDROCHLOROTHIAZIDE 10-12.5 MG PO TABS: 1.0000 | ORAL_TABLET | Freq: Every day | ORAL | Status: DC

## 2011-12-12 MED ORDER — ALBUTEROL SULFATE HFA 108 (90 BASE) MCG/ACT IN AERS: 2.0000 | INHALATION_SPRAY | Freq: Four times a day (QID) | RESPIRATORY_TRACT | Status: DC | PRN

## 2011-12-12 NOTE — Addendum Note (Signed)
Addended by: Arnette Norris on: 12/12/2011 12:11 PM   Modules accepted: Orders

## 2011-12-12 NOTE — Telephone Encounter (Signed)
Refill: Proair HFA 90 mcg/inh aer gm #8.5. Inhale 2 puffs into the lungs every 6 hours as needed for wheezing.

## 2011-12-12 NOTE — Telephone Encounter (Signed)
Rx sent, pt confirmed pharmacy, pt advised Rx sent.    MW

## 2012-01-12 ENCOUNTER — Other Ambulatory Visit: Payer: Self-pay | Admitting: Family Medicine

## 2012-01-12 MED ORDER — LISINOPRIL-HYDROCHLOROTHIAZIDE 10-12.5 MG PO TABS: 1.0000 | ORAL_TABLET | Freq: Every day | ORAL | Status: DC

## 2012-01-12 NOTE — Telephone Encounter (Signed)
refill lisinop/HCTZ TAB 10-12.5 take 1 tablet daily last wrt 9.27.13

## 2012-02-16 ENCOUNTER — Telehealth: Payer: Self-pay | Admitting: *Deleted

## 2012-02-16 ENCOUNTER — Telehealth: Payer: Self-pay | Admitting: Family Medicine

## 2012-02-16 DIAGNOSIS — F419 Anxiety disorder, unspecified: Secondary | ICD-10-CM

## 2012-02-16 MED ORDER — ALPRAZOLAM 0.5 MG PO TABS: 0.5000 mg | ORAL_TABLET | Freq: Three times a day (TID) | ORAL | Status: DC | PRN

## 2012-02-16 NOTE — Telephone Encounter (Signed)
Refill: Alprazolam 0.5mg  tabs. Take 1 tablet by mouth 3 times daily as needed for sleep or anxiety. Qty 90. Last fill 01-07-12

## 2012-02-16 NOTE — Telephone Encounter (Signed)
Last seen 10/20/11 and filled 01/07/12 # 90. Please advise     KP

## 2012-02-16 NOTE — Telephone Encounter (Signed)
Pt called the office and is complaining that the Rx  (Xanax) sent in was for only 30 pills and not the 90 she normally gets. I advised that Dr. Laury Axon would be back on Monday and we could address the other pills at that time. Pt is agreeable to picking up current script and waiting until Monday to hear back from Dr. Laury Axon. SGJ, RN

## 2012-02-16 NOTE — Telephone Encounter (Signed)
#   30 prn only

## 2012-02-23 ENCOUNTER — Telehealth: Payer: Self-pay | Admitting: Family Medicine

## 2012-02-23 DIAGNOSIS — F419 Anxiety disorder, unspecified: Secondary | ICD-10-CM

## 2012-02-23 MED ORDER — ALPRAZOLAM 0.5 MG PO TABS: 0.5000 mg | ORAL_TABLET | Freq: Three times a day (TID) | ORAL | Status: DC | PRN

## 2012-02-23 NOTE — Telephone Encounter (Signed)
Pt is concerned that Dr. Laury Axon know that she only received a month supply for her alprazolam and needs to have the additional two month filled before her insurance changes in January.

## 2012-02-23 NOTE — Telephone Encounter (Signed)
Spoke with patient and she picked up the medication. Rx for 60 faxed and the patient is aware.     KP

## 2012-02-23 NOTE — Telephone Encounter (Signed)
Did she fill the rx--- if not bring back and we will give new one for 90.   If she filled 30 ---ok to write 60

## 2012-02-23 NOTE — Telephone Encounter (Signed)
Rx was approved for only 30 tabs by Dr. Alwyn Ren and she normally gets 72. Please advise    KP

## 2012-02-23 NOTE — Telephone Encounter (Signed)
Returned your call ok to leave message on cell # [phone]

## 2012-03-29 ENCOUNTER — Telehealth: Payer: Self-pay | Admitting: Family Medicine

## 2012-03-29 DIAGNOSIS — F419 Anxiety disorder, unspecified: Secondary | ICD-10-CM

## 2012-03-29 MED ORDER — ALPRAZOLAM 0.5 MG PO TABS: 0.5000 mg | ORAL_TABLET | Freq: Three times a day (TID) | ORAL | Status: DC | PRN

## 2012-03-29 NOTE — Telephone Encounter (Signed)
Refill: Alprazolam 0.5 mg tab #90. Take 1 tablet by mouth 3 times daily as needed for sleep or anxiety. Last fill 01-07-12

## 2012-03-29 NOTE — Telephone Encounter (Signed)
Refill #60 

## 2012-03-29 NOTE — Telephone Encounter (Signed)
Last OV 10-06-11  02-23-12 #60

## 2012-03-30 ENCOUNTER — Telehealth: Payer: Self-pay | Admitting: Family Medicine

## 2012-03-30 DIAGNOSIS — F419 Anxiety disorder, unspecified: Secondary | ICD-10-CM

## 2012-03-30 NOTE — Telephone Encounter (Signed)
Called pharmacy and the patient had not picked up the Rx she was waiting on a quantity if #90. I called it in to the pharmacy.      KP

## 2012-03-30 NOTE — Telephone Encounter (Signed)
Patient states she always gets #90 on her alprazolam. Last fill it was refilled for #60 bc Dr. Laury Axon was out of town, but pt now needs #90. Pt uses [facility].

## 2012-03-31 ENCOUNTER — Telehealth: Payer: Self-pay | Admitting: Family Medicine

## 2012-03-31 NOTE — Telephone Encounter (Signed)
Refill x1 

## 2012-03-31 NOTE — Telephone Encounter (Signed)
Last seen 10/20/11 and filled 03/21/10. Please advise     KP

## 2012-03-31 NOTE — Telephone Encounter (Signed)
Refill: Meclizine hcl chew 25 mg tab #45. Take 1 tablet by mouth every 6 hours as needed for vertigo. Last fill 03-21-10

## 2012-04-01 MED ORDER — MECLIZINE HCL 25 MG PO CHEW: 1.0000 | CHEWABLE_TABLET | Freq: Four times a day (QID) | ORAL | Status: DC | PRN

## 2012-04-14 ENCOUNTER — Ambulatory Visit (INDEPENDENT_AMBULATORY_CARE_PROVIDER_SITE_OTHER): Payer: BC Managed Care – PPO | Admitting: Family Medicine

## 2012-04-14 ENCOUNTER — Encounter: Payer: Self-pay | Admitting: Family Medicine

## 2012-04-14 VITALS — BP 110/88 | HR 77 | Temp 97.8°F | Ht 60.0 in | Wt 213.0 lb

## 2012-04-14 DIAGNOSIS — R51 Headache: Secondary | ICD-10-CM

## 2012-04-14 DIAGNOSIS — J329 Chronic sinusitis, unspecified: Secondary | ICD-10-CM

## 2012-04-14 DIAGNOSIS — J019 Acute sinusitis, unspecified: Secondary | ICD-10-CM

## 2012-04-14 MED ORDER — BENZONATATE 200 MG PO CAPS: 200.0000 mg | ORAL_CAPSULE | Freq: Three times a day (TID) | ORAL | Status: DC | PRN

## 2012-04-14 MED ORDER — TRAMADOL HCL 50 MG PO TABS: 50.0000 mg | ORAL_TABLET | Freq: Three times a day (TID) | ORAL | Status: AC | PRN

## 2012-04-14 MED ORDER — CLARITHROMYCIN ER 500 MG PO TB24: 1000.0000 mg | ORAL_TABLET | Freq: Every day | ORAL | Status: AC

## 2012-04-14 MED ORDER — PROMETHAZINE HCL 25 MG PO TABS: 25.0000 mg | ORAL_TABLET | Freq: Four times a day (QID) | ORAL | Status: DC | PRN

## 2012-04-14 NOTE — Patient Instructions (Addendum)
This is a sinus infection Start the Biaxin- 2 tabs at the same time daily, take w/ food Use the Ultram as needed for severe headache Phenergan as needed for nausea Tessalon for cough Drink plenty of fluids REST! Hang in there!!!

## 2012-04-14 NOTE — Progress Notes (Signed)
  Subjective:    Patient ID: Deborah Watts, female    DOB: [birth year], 47 y.o.   MRN: [id number]  HPI URI- sxs started ~10 days ago.  + nasal congestion, facial pain/pressure, HA, R sided facial swelling, bilateral ear fullness.  No fevers.  + chills.  Mild nausea.  No vomiting or diarrhea.  No tooth pain.  + sick contacts.   Review of Systems For ROS see HPI     Objective:   Physical Exam  Constitutional: She appears well-developed and well-nourished. No distress.  HENT:  Head: Normocephalic and atraumatic.  Right Ear: Tympanic membrane normal.  Left Ear: Tympanic membrane normal.  Nose: Mucosal edema and rhinorrhea present. Right sinus exhibits maxillary sinus tenderness and frontal sinus tenderness. Left sinus exhibits maxillary sinus tenderness and frontal sinus tenderness.  Mouth/Throat: Uvula is midline and mucous membranes are normal. Posterior oropharyngeal erythema present. No oropharyngeal exudate.  Eyes: Conjunctivae normal and EOM are normal. Pupils are equal, round, and reactive to light.  Neck: Normal range of motion. Neck supple.  Cardiovascular: Normal rate, regular rhythm and normal heart sounds.   Pulmonary/Chest: Effort normal and breath sounds normal. No respiratory distress. She has no wheezes.  Lymphadenopathy:    She has no cervical adenopathy.          Assessment & Plan:

## 2012-04-14 NOTE — Assessment & Plan Note (Signed)
New to provider.  Start abx.  Pt reports severe HA pain- refill on Tramadol given.  Phenergan for nausea.  Tessalon for cough.  Reviewed supportive care and red flags that should prompt return.  Pt expressed understanding and is in agreement w/ plan.

## 2012-04-20 ENCOUNTER — Ambulatory Visit (INDEPENDENT_AMBULATORY_CARE_PROVIDER_SITE_OTHER): Payer: BC Managed Care – PPO | Admitting: Family Medicine

## 2012-04-20 ENCOUNTER — Encounter: Payer: Self-pay | Admitting: Family Medicine

## 2012-04-20 VITALS — BP 114/74 | HR 80 | Temp 98.2°F | Wt 212.2 lb

## 2012-04-20 DIAGNOSIS — J329 Chronic sinusitis, unspecified: Secondary | ICD-10-CM

## 2012-04-20 DIAGNOSIS — J40 Bronchitis, not specified as acute or chronic: Secondary | ICD-10-CM

## 2012-04-20 DIAGNOSIS — R0789 Other chest pain: Secondary | ICD-10-CM

## 2012-04-20 MED ORDER — ALBUTEROL SULFATE (2.5 MG/3ML) 0.083% IN NEBU
2.5000 mg | INHALATION_SOLUTION | Freq: Once | RESPIRATORY_TRACT | Status: AC
  Administered 2012-04-20: 2.5 mg via RESPIRATORY_TRACT

## 2012-04-20 MED ORDER — PREDNISONE 10 MG PO TABS: ORAL_TABLET | ORAL | Status: DC

## 2012-04-20 MED ORDER — LISINOPRIL-HYDROCHLOROTHIAZIDE 10-12.5 MG PO TABS: 1.0000 | ORAL_TABLET | Freq: Every day | ORAL | Status: DC

## 2012-04-20 MED ORDER — MONTELUKAST SODIUM 10 MG PO TABS: 10.0000 mg | ORAL_TABLET | Freq: Every day | ORAL | Status: DC

## 2012-04-20 MED ORDER — POTASSIUM CHLORIDE ER 10 MEQ PO TBCR: 10.0000 meq | EXTENDED_RELEASE_TABLET | Freq: Two times a day (BID) | ORAL | Status: DC

## 2012-04-20 MED ORDER — GUAIFENESIN-CODEINE 100-10 MG/5ML PO SYRP: ORAL_SOLUTION | ORAL | Status: DC

## 2012-04-20 NOTE — Patient Instructions (Signed)

## 2012-04-20 NOTE — Progress Notes (Signed)
  Subjective:     Deborah Watts is a 47 y.o. female here for evaluation of a cough. Onset of symptoms was several weeks ago. Symptoms have been gradually worsening since that time. The cough is productive and is aggravated by infection and reclining position. Associated symptoms include: chest pain, postnasal drip, shortness of breath, sputum production and wheezing. Patient does have a history of asthma. Patient does have a history of environmental allergens. Patient has not traveled recently. Patient does not have a history of smoking. Patient has not had a previous chest x-ray. Patient has not had a PPD done.  The following portions of the patient's history were reviewed and updated as appropriate: allergies, current medications, past family history, past medical history, past social history, past surgical history and problem list.  Review of Systems Pertinent items are noted in HPI.    Objective:    Oxygen saturation 98% on room air BP 114/74  Pulse 80  Temp 98.2 F (36.8 C) (Oral)  Wt 212 lb 3.2 oz (96.253 kg)  SpO2 98% General appearance: alert, cooperative, appears stated age and no distress Ears: normal TM's and external ear canals both ears Nose: Nares normal. Septum midline. Mucosa normal. No drainage or sinus tenderness. Throat: lips, mucosa, and tongue normal; teeth and gums normal Lungs: wheezes bilaterally Heart: S1, S2 normal    Assessment:    Acute Bronchitis    Plan:    Antibiotics per medication orders. Antitussives per medication orders. Avoid exposure to tobacco smoke and fumes. B-agonist inhaler. Call if shortness of breath worsens, blood in sputum, change in character of cough, development of fever or chills, inability to maintain nutrition and hydration. Avoid exposure to tobacco smoke and fumes. pred taper

## 2012-05-12 ENCOUNTER — Telehealth: Payer: Self-pay | Admitting: Family Medicine

## 2012-05-12 NOTE — Telephone Encounter (Signed)
Refill x1 

## 2012-05-12 NOTE — Telephone Encounter (Signed)
refill ALPRAZolam (Tab) 0.5 MG Take 1 tablet (0.5 mg total) by mouth 3 (three) times daily as needed for sleep or anxiety. #90 last fill 1.14.14

## 2012-05-12 NOTE — Telephone Encounter (Signed)
Last seen 2.4.14 and filled 1.13.14 #60. Please advise     KP

## 2012-05-13 MED ORDER — ALPRAZOLAM 0.5 MG PO TABS: 0.5000 mg | ORAL_TABLET | Freq: Three times a day (TID) | ORAL | Status: DC | PRN

## 2012-06-14 ENCOUNTER — Telehealth: Payer: Self-pay | Admitting: Family Medicine

## 2012-06-14 DIAGNOSIS — F419 Anxiety disorder, unspecified: Secondary | ICD-10-CM

## 2012-06-14 NOTE — Telephone Encounter (Signed)
Refill x1 

## 2012-06-14 NOTE — Telephone Encounter (Signed)
Last seen 04/20/12 and filled 05/13/12 #90. Please advise     KP

## 2012-06-14 NOTE — Telephone Encounter (Signed)
Refill- alprazolam 0.5mg  tab. Take one tablet by mouth 3 times daily as needed for sleep or anxiety. Qty 90 last fill 2.27.14

## 2012-06-15 MED ORDER — ALPRAZOLAM 0.5 MG PO TABS: 0.5000 mg | ORAL_TABLET | Freq: Three times a day (TID) | ORAL | Status: DC | PRN

## 2012-06-28 ENCOUNTER — Telehealth: Payer: Self-pay | Admitting: Family Medicine

## 2012-06-28 MED ORDER — ALBUTEROL SULFATE HFA 108 (90 BASE) MCG/ACT IN AERS: 2.0000 | INHALATION_SPRAY | Freq: Four times a day (QID) | RESPIRATORY_TRACT | Status: DC | PRN

## 2012-06-28 NOTE — Telephone Encounter (Signed)
Refill: ProAir hfa 90 mcg/inh aer gm # 8.5. Inhale 2 puffs into the lungs every 6 hours as needed for wheezing.

## 2012-07-05 ENCOUNTER — Telehealth: Payer: Self-pay | Admitting: Family Medicine

## 2012-07-05 DIAGNOSIS — G43909 Migraine, unspecified, not intractable, without status migrainosus: Secondary | ICD-10-CM

## 2012-07-05 MED ORDER — SUMATRIPTAN SUCCINATE 100 MG PO TABS: ORAL_TABLET | ORAL | Status: DC

## 2012-07-05 NOTE — Telephone Encounter (Signed)
sumatriptan succinat 100 mg Qty: 8.29.13 Take 1 tablet by mouth as needed for migraine and repeat in 2 hours as needed

## 2012-07-30 ENCOUNTER — Telehealth: Payer: Self-pay | Admitting: *Deleted

## 2012-07-30 DIAGNOSIS — F419 Anxiety disorder, unspecified: Secondary | ICD-10-CM

## 2012-07-30 NOTE — Telephone Encounter (Signed)
Last OV 04-20-12 , last filled 06-15-12 #90

## 2012-08-01 NOTE — Telephone Encounter (Signed)
Refill x1 

## 2012-08-02 MED ORDER — ALPRAZOLAM 0.5 MG PO TABS: 0.5000 mg | ORAL_TABLET | Freq: Three times a day (TID) | ORAL | Status: DC | PRN

## 2012-08-02 NOTE — Telephone Encounter (Signed)
Rx sent 

## 2012-08-04 ENCOUNTER — Telehealth: Payer: Self-pay | Admitting: General Practice

## 2012-08-04 NOTE — Telephone Encounter (Signed)
msg left to call the office     KP 

## 2012-08-04 NOTE — Telephone Encounter (Signed)
Faxed request for Valacyclovir refill. Pt last seen on 04/20/12 and med not listed on list. Please advise if ok to fill or would you like to see pt?

## 2012-08-04 NOTE — Telephone Encounter (Signed)
Who was writing this in the past and which diagnosis.  Does not look like we ever prescribed it.  At least not in a long time

## 2012-08-05 NOTE — Telephone Encounter (Signed)
Pt advised that she did not request this medication and has never used this med. Please disregard.

## 2012-09-06 ENCOUNTER — Other Ambulatory Visit: Payer: Self-pay

## 2012-09-06 DIAGNOSIS — F419 Anxiety disorder, unspecified: Secondary | ICD-10-CM

## 2012-09-06 DIAGNOSIS — G43909 Migraine, unspecified, not intractable, without status migrainosus: Secondary | ICD-10-CM

## 2012-09-06 MED ORDER — ALPRAZOLAM 0.5 MG PO TABS: 0.5000 mg | ORAL_TABLET | Freq: Three times a day (TID) | ORAL | Status: DC | PRN

## 2012-09-06 MED ORDER — SUMATRIPTAN SUCCINATE 100 MG PO TABS: ORAL_TABLET | ORAL | Status: DC

## 2012-09-06 NOTE — Telephone Encounter (Signed)
Last seen 04/20/12 and filled 08/02/12 #90   please advise     KP

## 2012-10-12 ENCOUNTER — Telehealth: Payer: Self-pay | Admitting: *Deleted

## 2012-10-12 NOTE — Telephone Encounter (Signed)
OK # 90 

## 2012-10-12 NOTE — Telephone Encounter (Signed)
Pharmacy is requesting a refill for Alprazolam 0.5 mg last ov 04/20/12 last date filled 09/06/12 90 ct 0 refills. Patient does not have a controlled substance contract on file this is a [facility] patient Please Advise.

## 2012-10-13 ENCOUNTER — Other Ambulatory Visit: Payer: Self-pay | Admitting: *Deleted

## 2012-10-13 DIAGNOSIS — F419 Anxiety disorder, unspecified: Secondary | ICD-10-CM

## 2012-10-13 MED ORDER — ALPRAZOLAM 0.5 MG PO TABS: 0.5000 mg | ORAL_TABLET | Freq: Three times a day (TID) | ORAL | Status: DC | PRN

## 2012-10-13 NOTE — Telephone Encounter (Signed)
Rx printed and placed on ledge for signature  

## 2012-10-25 ENCOUNTER — Other Ambulatory Visit: Payer: Self-pay | Admitting: Family Medicine

## 2012-11-02 ENCOUNTER — Ambulatory Visit (INDEPENDENT_AMBULATORY_CARE_PROVIDER_SITE_OTHER): Payer: BC Managed Care – PPO | Admitting: Family Medicine

## 2012-11-02 ENCOUNTER — Encounter: Payer: Self-pay | Admitting: Family Medicine

## 2012-11-02 VITALS — BP 110/60 | HR 95 | Temp 98.6°F | Wt 209.2 lb

## 2012-11-02 DIAGNOSIS — J209 Acute bronchitis, unspecified: Secondary | ICD-10-CM

## 2012-11-02 MED ORDER — PREDNISONE 10 MG PO TABS: ORAL_TABLET | ORAL | Status: DC

## 2012-11-02 MED ORDER — MOMETASONE FURO-FORMOTEROL FUM 200-5 MCG/ACT IN AERO: 2.0000 | INHALATION_SPRAY | Freq: Two times a day (BID) | RESPIRATORY_TRACT | Status: DC

## 2012-11-02 MED ORDER — ALBUTEROL SULFATE (2.5 MG/3ML) 0.083% IN NEBU
2.5000 mg | INHALATION_SOLUTION | Freq: Once | RESPIRATORY_TRACT | Status: AC
  Administered 2012-11-02: 2.5 mg via RESPIRATORY_TRACT

## 2012-11-02 MED ORDER — METHYLPREDNISOLONE ACETATE 80 MG/ML IJ SUSP
80.0000 mg | Freq: Once | INTRAMUSCULAR | Status: AC
  Administered 2012-11-02: 80 mg via INTRAMUSCULAR

## 2012-11-02 MED ORDER — AZITHROMYCIN 250 MG PO TABS: ORAL_TABLET | ORAL | Status: DC

## 2012-11-02 NOTE — Patient Instructions (Signed)

## 2012-11-02 NOTE — Progress Notes (Signed)
  Subjective:     Deborah Watts is a 47 y.o. female who presents for evaluation of sinus pain. Symptoms include: congestion, cough, facial pain, headaches, nasal congestion and sinus pressure. Onset of symptoms was 1 week ago. Symptoms have been gradually worsening since that time. Past history is significant for asthma. Patient is a non-smoker.  The following portions of the patient's history were reviewed and updated as appropriate: allergies, current medications, past family history, past medical history, past social history, past surgical history and problem list.  Review of Systems Pertinent items are noted in HPI.   Objective:    BP 110/60  Pulse 95  Temp(Src) 98.6 F (37 C) (Oral)  Wt 209 lb 3.2 oz (94.892 kg)  BMI 40.86 kg/m2  SpO2 97% General appearance: alert, cooperative, appears stated age and no distress Ears: normal TM's and external ear canals both ears Nose: green discharge, moderate congestion, turbinates red, swollen, sinus tenderness bilateral Throat: lips, mucosa, and tongue normal; teeth and gums normal Neck: no adenopathy, supple, symmetrical, trachea midline and thyroid not enlarged, symmetric, no tenderness/mass/nodules Lungs: wheezes bilaterally---cleared after neb Heart: S1, S2 normal Extremities: extremities normal, atraumatic, no cyanosis or edema    Assessment:    Acute bacterial sinusitis Acute asthmatic bronchitis.     Plan:    Nasal steroids per medication orders. Antihistamines per medication orders. Zithromax per medication orders. con't with inhalers

## 2012-11-03 ENCOUNTER — Encounter: Payer: Self-pay | Admitting: Family Medicine

## 2012-11-03 ENCOUNTER — Telehealth: Payer: Self-pay | Admitting: *Deleted

## 2012-11-03 MED ORDER — MECLIZINE HCL 25 MG PO CHEW: 1.0000 | CHEWABLE_TABLET | Freq: Four times a day (QID) | ORAL | Status: DC | PRN

## 2012-11-03 NOTE — Telephone Encounter (Signed)
Ok to refill x 1  

## 2012-11-03 NOTE — Telephone Encounter (Signed)
Pharmacy's requesting a refill for Meclizine 25 mg. Patients last ov was 11/02/2012.  Please advise.  ag cma

## 2012-11-03 NOTE — Telephone Encounter (Signed)
Rx was filled for meclizine #45 0-R.  Ag cma

## 2012-11-05 ENCOUNTER — Telehealth: Payer: Self-pay | Admitting: Family Medicine

## 2012-11-05 NOTE — Telephone Encounter (Signed)
Agree with ER.

## 2012-11-05 NOTE — Telephone Encounter (Signed)
I have tried to contact the patient-- Msg left to call the office      KP

## 2012-11-05 NOTE — Telephone Encounter (Signed)
Caller Name: Monna  Phone: [phone]  Patient: Deborah Watts, Deborah Watts  Gender: Female  DOB: Nov 09, 1965  Age: 47 Years  PCP: Lelon Perla.  Pregnant: No   Does the office need to follow up with this patient?: No  RN Note:  at work, agrees to care advice to call 911   Reason For Call & Symptoms: 2nd call, emergent regarding numbness of throat and left side of tongue since 0900, being treated for URI: Prednisone and Zpak.  Reviewed Health History In EMR: N/A  Reviewed Medications In EMR: N/A  Reviewed Allergies In EMR: N/A  Reviewed Surgeries / Procedures: N/A  Date of Onset of Symptoms: 11/05/2012  Guideline(s) Used:  Neurologic Deficit  Disposition Per Guideline:  Call EMS 911 Now  Reason For Disposition Reached:  New neurologic deficit that is present NOW, sudden onset of ANY of the following: Numbness of the face, arm, or leg on one side of the body   Advice Given: Call 911  Call Back If:  You become worse.  Patient Will Follow Care Advice:  YES

## 2012-11-05 NOTE — Telephone Encounter (Signed)
To MD for review     KP 

## 2012-11-05 NOTE — Telephone Encounter (Signed)
Emergent call , Elcie called reporting numbness of throat and left side of tongue, attempt to return call unsuccessful, message left to call office.

## 2012-11-23 ENCOUNTER — Telehealth: Payer: Self-pay | Admitting: Family Medicine

## 2012-11-23 DIAGNOSIS — F419 Anxiety disorder, unspecified: Secondary | ICD-10-CM

## 2012-11-23 NOTE — Telephone Encounter (Signed)
Patient states that [facility] has faxed refill requests for her Xanax rx three times to our office. Advised patient that I did not see a recent record of requests for this but would document that she is requesting a refill. Patient would like a 44-month supply for her Xanax sent to [facility].

## 2012-11-23 NOTE — Telephone Encounter (Signed)
Error, already addressed

## 2012-11-23 NOTE — Telephone Encounter (Signed)
Last seen 11/02/12 and filled 10/13/12 #90. No UDS or contract. Please advise     KP

## 2012-11-24 ENCOUNTER — Telehealth: Payer: Self-pay

## 2012-11-24 DIAGNOSIS — F419 Anxiety disorder, unspecified: Secondary | ICD-10-CM

## 2012-11-24 MED ORDER — ALPRAZOLAM 0.5 MG PO TABS: 0.5000 mg | ORAL_TABLET | Freq: Three times a day (TID) | ORAL | Status: DC | PRN

## 2012-11-24 NOTE — Telephone Encounter (Signed)
Patient is aware of UDS and haas come to the office to sign contract. Rx given

## 2012-11-24 NOTE — Telephone Encounter (Signed)
Pharmacy called to request status of Rx.

## 2012-11-26 ENCOUNTER — Other Ambulatory Visit: Payer: Self-pay | Admitting: *Deleted

## 2012-11-26 DIAGNOSIS — G43909 Migraine, unspecified, not intractable, without status migrainosus: Secondary | ICD-10-CM

## 2012-11-26 MED ORDER — SUMATRIPTAN SUCCINATE 100 MG PO TABS: ORAL_TABLET | ORAL | Status: DC

## 2012-11-26 NOTE — Telephone Encounter (Signed)
Rx has been refilled for sumariptan 100 mg.  Ag cma

## 2013-01-04 ENCOUNTER — Telehealth: Payer: Self-pay

## 2013-01-04 ENCOUNTER — Other Ambulatory Visit: Payer: Self-pay | Admitting: General Practice

## 2013-01-04 DIAGNOSIS — F419 Anxiety disorder, unspecified: Secondary | ICD-10-CM

## 2013-01-04 MED ORDER — ALPRAZOLAM 0.5 MG PO TABS: 0.5000 mg | ORAL_TABLET | Freq: Three times a day (TID) | ORAL | Status: DC | PRN

## 2013-01-04 NOTE — Telephone Encounter (Signed)
Last OV 11-02-12 (bronchitis) Med filled 11-24-12 #90 with 0 refills

## 2013-01-04 NOTE — Telephone Encounter (Signed)
Med filled.  

## 2013-01-04 NOTE — Telephone Encounter (Addendum)
Medication and allergies: reviewed  90 day supply/mail order: Deborah Watts Local pharmacy: Deborah Watts ---[facility] for Xanax   Immunizations due:  Offered flu vaccine  A/P:   No changes to [id number] Perform PAP at this visit MMG-never had one Tdap 2006 per patient, entered in HM  To Discuss with Provider: Reaction to prednisone

## 2013-01-06 ENCOUNTER — Ambulatory Visit (INDEPENDENT_AMBULATORY_CARE_PROVIDER_SITE_OTHER): Payer: BC Managed Care – PPO | Admitting: Family Medicine

## 2013-01-06 ENCOUNTER — Encounter: Payer: Self-pay | Admitting: Family Medicine

## 2013-01-06 ENCOUNTER — Other Ambulatory Visit (HOSPITAL_COMMUNITY)
Admission: RE | Admit: 2013-01-06 | Discharge: 2013-01-06 | Disposition: A | Discharge status: Home / Self Care | Payer: BC Managed Care – PPO | Source: Ambulatory Visit | Attending: Family Medicine | Admitting: Family Medicine

## 2013-01-06 VITALS — BP 129/83 | HR 74 | Temp 98.3°F | Ht 63.5 in | Wt 206.6 lb

## 2013-01-06 DIAGNOSIS — Z1151 Encounter for screening for human papillomavirus (HPV): Secondary | ICD-10-CM | POA: Insufficient documentation

## 2013-01-06 DIAGNOSIS — M179 Osteoarthritis of knee, unspecified: Secondary | ICD-10-CM

## 2013-01-06 DIAGNOSIS — I10 Essential (primary) hypertension: Secondary | ICD-10-CM

## 2013-01-06 DIAGNOSIS — Z01419 Encounter for gynecological examination (general) (routine) without abnormal findings: Secondary | ICD-10-CM | POA: Insufficient documentation

## 2013-01-06 DIAGNOSIS — Z1231 Encounter for screening mammogram for malignant neoplasm of breast: Secondary | ICD-10-CM

## 2013-01-06 DIAGNOSIS — G43909 Migraine, unspecified, not intractable, without status migrainosus: Secondary | ICD-10-CM

## 2013-01-06 DIAGNOSIS — Z124 Encounter for screening for malignant neoplasm of cervix: Secondary | ICD-10-CM

## 2013-01-06 DIAGNOSIS — J329 Chronic sinusitis, unspecified: Secondary | ICD-10-CM

## 2013-01-06 DIAGNOSIS — Z23 Encounter for immunization: Secondary | ICD-10-CM

## 2013-01-06 DIAGNOSIS — F419 Anxiety disorder, unspecified: Secondary | ICD-10-CM

## 2013-01-06 DIAGNOSIS — M1710 Unilateral primary osteoarthritis, unspecified knee: Secondary | ICD-10-CM

## 2013-01-06 DIAGNOSIS — R319 Hematuria, unspecified: Secondary | ICD-10-CM

## 2013-01-06 DIAGNOSIS — Z0000 Encounter for general adult medical examination without abnormal findings: Secondary | ICD-10-CM

## 2013-01-06 DIAGNOSIS — J209 Acute bronchitis, unspecified: Secondary | ICD-10-CM

## 2013-01-06 DIAGNOSIS — E669 Obesity, unspecified: Secondary | ICD-10-CM | POA: Insufficient documentation

## 2013-01-06 DIAGNOSIS — J45909 Unspecified asthma, uncomplicated: Secondary | ICD-10-CM

## 2013-01-06 DIAGNOSIS — F411 Generalized anxiety disorder: Secondary | ICD-10-CM

## 2013-01-06 LAB — CBC WITH DIFFERENTIAL/PLATELET
Basophils Relative: 0.5 % (ref 0.0–3.0)
Eosinophils Absolute: 0.3 10*3/uL (ref 0.0–0.7)
Lymphs Abs: 2.4 10*3/uL (ref 0.7–4.0)
MCHC: 33.8 g/dL (ref 30.0–36.0)
MCV: 85.7 fl (ref 78.0–100.0)
Monocytes Absolute: 0.6 10*3/uL (ref 0.1–1.0)
Neutrophils Relative %: 68.8 % (ref 43.0–77.0)
Platelets: 337 10*3/uL (ref 150.0–400.0)

## 2013-01-06 LAB — POCT URINALYSIS DIPSTICK
Bilirubin, UA: NEGATIVE
Glucose, UA: NEGATIVE
Ketones, UA: NEGATIVE
Spec Grav, UA: <=1.005
pH, UA: 6

## 2013-01-06 LAB — LIPID PANEL
Cholesterol: 171 mg/dL (ref 0–200)
Triglycerides: 205 mg/dL — ABNORMAL HIGH (ref 0.0–149.0)
VLDL: 41 mg/dL — ABNORMAL HIGH (ref 0.0–40.0)

## 2013-01-06 LAB — LDL CHOLESTEROL, DIRECT: Direct LDL: 107.7 mg/dL

## 2013-01-06 LAB — BASIC METABOLIC PANEL
BUN: 12 mg/dL (ref 6–23)
CO2: 32 mEq/L (ref 19–32)
Chloride: 100 mEq/L (ref 96–112)
Creatinine, Ser: 0.7 mg/dL (ref 0.4–1.2)
Glucose, Bld: 90 mg/dL (ref 70–99)

## 2013-01-06 LAB — HEPATIC FUNCTION PANEL
Bilirubin, Direct: 0 mg/dL (ref 0.0–0.3)
Total Bilirubin: 0.4 mg/dL (ref 0.3–1.2)
Total Protein: 7.5 g/dL (ref 6.0–8.3)

## 2013-01-06 MED ORDER — MELOXICAM 15 MG PO TABS: 15.0000 mg | ORAL_TABLET | Freq: Every day | ORAL | Status: DC

## 2013-01-06 MED ORDER — MONTELUKAST SODIUM 10 MG PO TABS: 10.0000 mg | ORAL_TABLET | Freq: Every day | ORAL | Status: DC

## 2013-01-06 MED ORDER — SUMATRIPTAN SUCCINATE 100 MG PO TABS: ORAL_TABLET | ORAL | Status: DC

## 2013-01-06 MED ORDER — MOMETASONE FUROATE 50 MCG/ACT NA SUSP: NASAL | Status: DC

## 2013-01-06 MED ORDER — ALPRAZOLAM 0.5 MG PO TABS: 0.5000 mg | ORAL_TABLET | Freq: Three times a day (TID) | ORAL | Status: DC | PRN

## 2013-01-06 MED ORDER — ALBUTEROL SULFATE HFA 108 (90 BASE) MCG/ACT IN AERS: 2.0000 | INHALATION_SPRAY | Freq: Four times a day (QID) | RESPIRATORY_TRACT | Status: DC | PRN

## 2013-01-06 MED ORDER — LISINOPRIL-HYDROCHLOROTHIAZIDE 10-12.5 MG PO TABS: ORAL_TABLET | ORAL | Status: DC

## 2013-01-06 NOTE — Progress Notes (Signed)
Subjective:     Deborah Watts is a 47 y.o. female and is here for a comprehensive physical exam. The patient reports no problems.  History   Social History  . Marital Status: Married    Spouse Name: N/A    Number of Children: N/A  . Years of Education: N/A   Occupational History  . [employer]    Social History Main Topics  . Smoking status: Never Smoker   . Smokeless tobacco: Never Used  . Alcohol Use: 0.0 oz/week     Comment: occassional  . Drug Use: No  . Sexual Activity: Yes    Partners: Male   Other Topics Concern  . Not on file   Social History Narrative   Exercise-- no   Health Maintenance  Topic Date Due  . Pap Smear  06/28/1982  . Influenza Vaccine  10/15/2012  . Tetanus/tdap  03/17/2014    The following portions of the patient's history were reviewed and updated as appropriate:  She  has a past medical history of Asthma; Anxiety; MHA (microangiopathic hemolytic anemia); and Hypertension. She  does not have any pertinent problems on file. She  has no past surgical history on file. Her family history includes Asthma in an other family member; Diabetes in an other family member; Hyperlipidemia in an other family member; Throat cancer in her father. She  reports that she has never smoked. She has never used smokeless tobacco. She reports that she drinks alcohol. She reports that she does not use illicit drugs. She has a current medication list which includes the following prescription(s): albuterol, alprazolam, lisinopril-hydrochlorothiazide, loratadine, meclizine hcl, meloxicam, mometasone, mometasone-formoterol, montelukast, nystatin, potassium chloride, potassium chloride, promethazine, and sumatriptan. Current Outpatient Prescriptions on File Prior to Visit  Medication Sig Dispense Refill  . albuterol (PROAIR HFA) 108 (90 BASE) MCG/ACT inhaler Inhale 2 puffs into the lungs every 6 (six) hours as needed for wheezing.  1 Inhaler  3  . ALPRAZolam (XANAX) 0.5 MG  tablet Take 1 tablet (0.5 mg total) by mouth 3 (three) times daily as needed for sleep or anxiety.  90 tablet  0  . lisinopril-hydrochlorothiazide (PRINZIDE,ZESTORETIC) 10-12.5 MG per tablet TAKE ONE TABLET BY MOUTH ONCE DAILY  90 tablet  1  . loratadine (CLARITIN) 10 MG tablet Take 10 mg by mouth daily.      . Meclizine HCl 25 MG CHEW Chew 1 tablet (25 mg total) by mouth every 6 (six) hours as needed.  45 each  0  . meloxicam (MOBIC) 15 MG tablet Take 15 mg by mouth daily.      . mometasone (NASONEX) 50 MCG/ACT nasal spray 2 sprays each nostril qd  17 g  5  . mometasone-formoterol (DULERA) 200-5 MCG/ACT AERO Inhale 2 puffs into the lungs 2 (two) times daily.  1 Inhaler  11  . montelukast (SINGULAIR) 10 MG tablet Take 1 tablet (10 mg total) by mouth at bedtime.  90 tablet  3  . nystatin (MYCOSTATIN) 100000 UNIT/ML suspension       . potassium chloride (K-DUR) 10 MEQ tablet Take 1 tablet (10 mEq total) by mouth 2 (two) times daily.  180 tablet  1  . potassium chloride (KLOR-CON) 10 MEQ CR tablet Take 10 mEq by mouth 2 (two) times daily.        . promethazine (PHENERGAN) 25 MG tablet Take 1 tablet (25 mg total) by mouth every 6 (six) hours as needed for nausea.  30 tablet  0  . SUMAtriptan (IMITREX) 100  MG tablet Take 1 tablet as need for Migraine and repeat in 2 hours prn  12 tablet  0   No current facility-administered medications on file prior to visit.   She is allergic to prednisone; iohexol; ivp dye; and penicillins..  Review of Systems Review of Systems  Constitutional: Negative for activity change, appetite change and fatigue.  HENT: Negative for hearing loss, congestion, tinnitus and ear discharge.  dentist q82m Eyes: Negative for visual disturbance (see optho q1y -- vision corrected to 20/20 with glasses).  Respiratory: Negative for cough, chest tightness and shortness of breath.   Cardiovascular: Negative for chest pain, palpitations and leg swelling.  Gastrointestinal: Negative for  abdominal pain, diarrhea, constipation and abdominal distention.  Genitourinary: Negative for urgency, frequency, decreased urine volume and difficulty urinating.  Musculoskeletal: Negative for back pain, arthralgias and gait problem.  Skin: Negative for color change, pallor and rash.  Neurological: Negative for dizziness, light-headedness, numbness and headaches.  Hematological: Negative for adenopathy. Does not bruise/bleed easily.  Psychiatric/Behavioral: Negative for suicidal ideas, confusion, sleep disturbance, self-injury, dysphoric mood, decreased concentration and agitation.       Objective:    BP 129/83  Pulse 74  Temp(Src) 98.3 F (36.8 C) (Oral)  Ht 5' 3.5" (1.613 m)  Wt 206 lb 9.6 oz (93.713 kg)  BMI 36.02 kg/m2  SpO2 95% General appearance: alert, cooperative, appears stated age and no distress Head: Normocephalic, without obvious abnormality, atraumatic Eyes: conjunctivae/corneas clear. PERRL, EOM's intact. Fundi benign. Ears: normal TM's and external ear canals both ears Nose: Nares normal. Septum midline. Mucosa normal. No drainage or sinus tenderness. Throat: lips, mucosa, and tongue normal; teeth and gums normal Neck: no adenopathy, no carotid bruit, no JVD, supple, symmetrical, trachea midline and thyroid not enlarged, symmetric, no tenderness/mass/nodules Back: symmetric, no curvature. ROM normal. No CVA tenderness. Lungs: clear to auscultation bilaterally Breasts: gyn Heart: regular rate and rhythm, S1, S2 normal, no murmur, click, rub or gallop Abdomen: soft, non-tender; bowel sounds normal; no masses,  no organomegaly Pelvic: deferred--gyn Extremities: extremities normal, atraumatic, no cyanosis or edema Pulses: 2+ and symmetric Skin: Skin color, texture, turgor normal. No rashes or lesions Lymph nodes: Cervical, supraclavicular, and axillary nodes normal. Neurologic: Alert and oriented X 3, normal strength and tone. Normal symmetric reflexes. Normal  coordination and gait Psych-- no depression, no anxiety      Assessment:    Healthy female exam.     Plan:    ghm utd Check labs See After Visit Summary for Counseling Recommendations

## 2013-01-06 NOTE — Patient Instructions (Signed)
Preventive Care for Adults, Female A healthy lifestyle and preventive care can promote health and wellness. Preventive health guidelines for women include the following key practices.  A routine yearly physical is a good way to check with your caregiver about your health and preventive screening. It is a chance to share any concerns and updates on your health, and to receive a thorough exam.  Visit your dentist for a routine exam and preventive care every 6 months. Brush your teeth twice a day and floss once a day. Good oral hygiene prevents tooth decay and gum disease.  The frequency of eye exams is based on your age, health, family medical history, use of contact lenses, and other factors. Follow your caregiver's recommendations for frequency of eye exams.  Eat a healthy diet. Foods like vegetables, fruits, whole grains, low-fat dairy products, and lean protein foods contain the nutrients you need without too many calories. Decrease your intake of foods high in solid fats, added sugars, and salt. Eat the right amount of calories for you.Get information about a proper diet from your caregiver, if necessary.  Regular physical exercise is one of the most important things you can do for your health. Most adults should get at least 150 minutes of moderate-intensity exercise (any activity that increases your heart rate and causes you to sweat) each week. In addition, most adults need muscle-strengthening exercises on 2 or more days a week.  Maintain a healthy weight. The body mass index (BMI) is a screening tool to identify possible weight problems. It provides an estimate of body fat based on height and weight. Your caregiver can help determine your BMI, and can help you achieve or maintain a healthy weight.For adults 20 years and older:  A BMI below 18.5 is considered underweight.  A BMI of 18.5 to 24.9 is normal.  A BMI of 25 to 29.9 is considered overweight.  A BMI of 30 and above is  considered obese.  Maintain normal blood lipids and cholesterol levels by exercising and minimizing your intake of saturated fat. Eat a balanced diet with plenty of fruit and vegetables. Blood tests for lipids and cholesterol should begin at age 20 and be repeated every 5 years. If your lipid or cholesterol levels are high, you are over 50, or you are at high risk for heart disease, you may need your cholesterol levels checked more frequently.Ongoing high lipid and cholesterol levels should be treated with medicines if diet and exercise are not effective.  If you smoke, find out from your caregiver how to quit. If you do not use tobacco, do not start.  If you are pregnant, do not drink alcohol. If you are breastfeeding, be very cautious about drinking alcohol. If you are not pregnant and choose to drink alcohol, do not exceed 1 drink per day. One drink is considered to be 12 ounces (355 mL) of beer, 5 ounces (148 mL) of wine, or 1.5 ounces (44 mL) of liquor.  Avoid use of street drugs. Do not share needles with anyone. Ask for help if you need support or instructions about stopping the use of drugs.  High blood pressure causes heart disease and increases the risk of stroke. Your blood pressure should be checked at least every 1 to 2 years. Ongoing high blood pressure should be treated with medicines if weight loss and exercise are not effective.  If you are 55 to 48 years old, ask your caregiver if you should take aspirin to prevent strokes.  Diabetes   screening involves taking a blood sample to check your fasting blood sugar level. This should be done once every 3 years, after age 45, if you are within normal weight and without risk factors for diabetes. Testing should be considered at a younger age or be carried out more frequently if you are overweight and have at least 1 risk factor for diabetes.  Breast cancer screening is essential preventive care for women. You should practice "breast  self-awareness." This means understanding the normal appearance and feel of your breasts and may include breast self-examination. Any changes detected, no matter how small, should be reported to a caregiver. Women in their 20s and 30s should have a clinical breast exam (CBE) by a caregiver as part of a regular health exam every 1 to 3 years. After age 40, women should have a CBE every year. Starting at age 40, women should consider having a mammography (breast X-ray test) every year. Women who have a family history of breast cancer should talk to their caregiver about genetic screening. Women at a high risk of breast cancer should talk to their caregivers about having magnetic resonance imaging (MRI) and a mammography every year.  The Pap test is a screening test for cervical cancer. A Pap test can show cell changes on the cervix that might become cervical cancer if left untreated. A Pap test is a procedure in which cells are obtained and examined from the lower end of the uterus (cervix).  Women should have a Pap test starting at age 21.  Between ages 21 and 29, Pap tests should be repeated every 2 years.  Beginning at age 30, you should have a Pap test every 3 years as long as the past 3 Pap tests have been normal.  Some women have medical problems that increase the chance of getting cervical cancer. Talk to your caregiver about these problems. It is especially important to talk to your caregiver if a new problem develops soon after your last Pap test. In these cases, your caregiver may recommend more frequent screening and Pap tests.  The above recommendations are the same for women who have or have not gotten the vaccine for human papillomavirus (HPV).  If you had a hysterectomy for a problem that was not cancer or a condition that could lead to cancer, then you no longer need Pap tests. Even if you no longer need a Pap test, a regular exam is a good idea to make sure no other problems are  starting.  If you are between ages 65 and 70, and you have had normal Pap tests going back 10 years, you no longer need Pap tests. Even if you no longer need a Pap test, a regular exam is a good idea to make sure no other problems are starting.  If you have had past treatment for cervical cancer or a condition that could lead to cancer, you need Pap tests and screening for cancer for at least 20 years after your treatment.  If Pap tests have been discontinued, risk factors (such as a new sexual partner) need to be reassessed to determine if screening should be resumed.  The HPV test is an additional test that may be used for cervical cancer screening. The HPV test looks for the virus that can cause the cell changes on the cervix. The cells collected during the Pap test can be tested for HPV. The HPV test could be used to screen women aged 30 years and older, and should   be used in women of any age who have unclear Pap test results. After the age of 30, women should have HPV testing at the same frequency as a Pap test.  Colorectal cancer can be detected and often prevented. Most routine colorectal cancer screening begins at the age of 50 and continues through age 75. However, your caregiver may recommend screening at an earlier age if you have risk factors for colon cancer. On a yearly basis, your caregiver may provide home test kits to check for hidden blood in the stool. Use of a small camera at the end of a tube, to directly examine the colon (sigmoidoscopy or colonoscopy), can detect the earliest forms of colorectal cancer. Talk to your caregiver about this at age 50, when routine screening begins. Direct examination of the colon should be repeated every 5 to 10 years through age 75, unless early forms of pre-cancerous polyps or small growths are found.  Hepatitis C blood testing is recommended for all people born from 1945 through 1965 and any individual with known risks for hepatitis C.  Practice  safe sex. Use condoms and avoid high-risk sexual practices to reduce the spread of sexually transmitted infections (STIs). STIs include gonorrhea, chlamydia, syphilis, trichomonas, herpes, HPV, and human immunodeficiency virus (HIV). Herpes, HIV, and HPV are viral illnesses that have no cure. They can result in disability, cancer, and death. Sexually active women aged 25 and younger should be checked for chlamydia. Older women with new or multiple partners should also be tested for chlamydia. Testing for other STIs is recommended if you are sexually active and at increased risk.  Osteoporosis is a disease in which the bones lose minerals and strength with aging. This can result in serious bone fractures. The risk of osteoporosis can be identified using a bone density scan. Women ages 65 and over and women at risk for fractures or osteoporosis should discuss screening with their caregivers. Ask your caregiver whether you should take a calcium supplement or vitamin D to reduce the rate of osteoporosis.  Menopause can be associated with physical symptoms and risks. Hormone replacement therapy is available to decrease symptoms and risks. You should talk to your caregiver about whether hormone replacement therapy is right for you.  Use sunscreen with sun protection factor (SPF) of 30 or more. Apply sunscreen liberally and repeatedly throughout the day. You should seek shade when your shadow is shorter than you. Protect yourself by wearing long sleeves, pants, a wide-brimmed hat, and sunglasses year round, whenever you are outdoors.  Once a month, do a whole body skin exam, using a mirror to look at the skin on your back. Notify your caregiver of new moles, moles that have irregular borders, moles that are larger than a pencil eraser, or moles that have changed in shape or color.  Stay current with required immunizations.  Influenza. You need a dose every fall (or winter). The composition of the flu vaccine  changes each year, so being vaccinated once is not enough.  Pneumococcal polysaccharide. You need 1 to 2 doses if you smoke cigarettes or if you have certain chronic medical conditions. You need 1 dose at age 65 (or older) if you have never been vaccinated.  Tetanus, diphtheria, pertussis (Tdap, Td). Get 1 dose of Tdap vaccine if you are younger than age 65, are over 65 and have contact with an infant, are a healthcare worker, are pregnant, or simply want to be protected from whooping cough. After that, you need a Td   booster dose every 10 years. Consult your caregiver if you have not had at least 3 tetanus and diphtheria-containing shots sometime in your life or have a deep or dirty wound.  HPV. You need this vaccine if you are a woman age 26 or younger. The vaccine is given in 3 doses over 6 months.  Measles, mumps, rubella (MMR). You need at least 1 dose of MMR if you were born in 1957 or later. You may also need a second dose.  Meningococcal. If you are age 19 to 21 and a first-year college student living in a residence hall, or have one of several medical conditions, you need to get vaccinated against meningococcal disease. You may also need additional booster doses.  Zoster (shingles). If you are age 60 or older, you should get this vaccine.  Varicella (chickenpox). If you have never had chickenpox or you were vaccinated but received only 1 dose, talk to your caregiver to find out if you need this vaccine.  Hepatitis A. You need this vaccine if you have a specific risk factor for hepatitis A virus infection or you simply wish to be protected from this disease. The vaccine is usually given as 2 doses, 6 to 18 months apart.  Hepatitis B. You need this vaccine if you have a specific risk factor for hepatitis B virus infection or you simply wish to be protected from this disease. The vaccine is given in 3 doses, usually over 6 months. Preventive Services / Frequency Ages 19 to 39  Blood  pressure check.** / Every 1 to 2 years.  Lipid and cholesterol check.** / Every 5 years beginning at age 20.  Clinical breast exam.** / Every 3 years for women in their 20s and 30s.  Pap test.** / Every 2 years from ages 21 through 29. Every 3 years starting at age 30 through age 65 or 70 with a history of 3 consecutive normal Pap tests.  HPV screening.** / Every 3 years from ages 30 through ages 65 to 70 with a history of 3 consecutive normal Pap tests.  Hepatitis C blood test.** / For any individual with known risks for hepatitis C.  Skin self-exam. / Monthly.  Influenza immunization.** / Every year.  Pneumococcal polysaccharide immunization.** / 1 to 2 doses if you smoke cigarettes or if you have certain chronic medical conditions.  Tetanus, diphtheria, pertussis (Tdap, Td) immunization. / A one-time dose of Tdap vaccine. After that, you need a Td booster dose every 10 years.  HPV immunization. / 3 doses over 6 months, if you are 26 and younger.  Measles, mumps, rubella (MMR) immunization. / You need at least 1 dose of MMR if you were born in 1957 or later. You may also need a second dose.  Meningococcal immunization. / 1 dose if you are age 19 to 21 and a first-year college student living in a residence hall, or have one of several medical conditions, you need to get vaccinated against meningococcal disease. You may also need additional booster doses.  Varicella immunization.** / Consult your caregiver.  Hepatitis A immunization.** / Consult your caregiver. 2 doses, 6 to 18 months apart.  Hepatitis B immunization.** / Consult your caregiver. 3 doses usually over 6 months. Ages 40 to 64  Blood pressure check.** / Every 1 to 2 years.  Lipid and cholesterol check.** / Every 5 years beginning at age 20.  Clinical breast exam.** / Every year after age 40.  Mammogram.** / Every year beginning at age 40   and continuing for as long as you are in good health. Consult with your  caregiver.  Pap test.** / Every 3 years starting at age 30 through age 65 or 70 with a history of 3 consecutive normal Pap tests.  HPV screening.** / Every 3 years from ages 30 through ages 65 to 70 with a history of 3 consecutive normal Pap tests.  Fecal occult blood test (FOBT) of stool. / Every year beginning at age 50 and continuing until age 75. You may not need to do this test if you get a colonoscopy every 10 years.  Flexible sigmoidoscopy or colonoscopy.** / Every 5 years for a flexible sigmoidoscopy or every 10 years for a colonoscopy beginning at age 50 and continuing until age 75.  Hepatitis C blood test.** / For all people born from 1945 through 1965 and any individual with known risks for hepatitis C.  Skin self-exam. / Monthly.  Influenza immunization.** / Every year.  Pneumococcal polysaccharide immunization.** / 1 to 2 doses if you smoke cigarettes or if you have certain chronic medical conditions.  Tetanus, diphtheria, pertussis (Tdap, Td) immunization.** / A one-time dose of Tdap vaccine. After that, you need a Td booster dose every 10 years.  Measles, mumps, rubella (MMR) immunization. / You need at least 1 dose of MMR if you were born in 1957 or later. You may also need a second dose.  Varicella immunization.** / Consult your caregiver.  Meningococcal immunization.** / Consult your caregiver.  Hepatitis A immunization.** / Consult your caregiver. 2 doses, 6 to 18 months apart.  Hepatitis B immunization.** / Consult your caregiver. 3 doses, usually over 6 months. Ages 65 and over  Blood pressure check.** / Every 1 to 2 years.  Lipid and cholesterol check.** / Every 5 years beginning at age 20.  Clinical breast exam.** / Every year after age 40.  Mammogram.** / Every year beginning at age 40 and continuing for as long as you are in good health. Consult with your caregiver.  Pap test.** / Every 3 years starting at age 30 through age 65 or 70 with a 3  consecutive normal Pap tests. Testing can be stopped between 65 and 70 with 3 consecutive normal Pap tests and no abnormal Pap or HPV tests in the past 10 years.  HPV screening.** / Every 3 years from ages 30 through ages 65 or 70 with a history of 3 consecutive normal Pap tests. Testing can be stopped between 65 and 70 with 3 consecutive normal Pap tests and no abnormal Pap or HPV tests in the past 10 years.  Fecal occult blood test (FOBT) of stool. / Every year beginning at age 50 and continuing until age 75. You may not need to do this test if you get a colonoscopy every 10 years.  Flexible sigmoidoscopy or colonoscopy.** / Every 5 years for a flexible sigmoidoscopy or every 10 years for a colonoscopy beginning at age 50 and continuing until age 75.  Hepatitis C blood test.** / For all people born from 1945 through 1965 and any individual with known risks for hepatitis C.  Osteoporosis screening.** / A one-time screening for women ages 65 and over and women at risk for fractures or osteoporosis.  Skin self-exam. / Monthly.  Influenza immunization.** / Every year.  Pneumococcal polysaccharide immunization.** / 1 dose at age 65 (or older) if you have never been vaccinated.  Tetanus, diphtheria, pertussis (Tdap, Td) immunization. / A one-time dose of Tdap vaccine if you are over   65 and have contact with an infant, are a healthcare worker, or simply want to be protected from whooping cough. After that, you need a Td booster dose every 10 years.  Varicella immunization.** / Consult your caregiver.  Meningococcal immunization.** / Consult your caregiver.  Hepatitis A immunization.** / Consult your caregiver. 2 doses, 6 to 18 months apart.  Hepatitis B immunization.** / Check with your caregiver. 3 doses, usually over 6 months. ** Family history and personal history of risk and conditions may change your caregiver's recommendations. Document Released: 04/29/2001 Document Revised: 05/26/2011  Document Reviewed: 07/29/2010 [employer] Patient Information 2014 [employer].  

## 2013-01-08 NOTE — Assessment & Plan Note (Signed)
Stable Cont meds 

## 2013-01-09 LAB — URINE CULTURE: Colony Count: 80000

## 2013-01-10 ENCOUNTER — Other Ambulatory Visit: Payer: Self-pay

## 2013-01-10 DIAGNOSIS — J329 Chronic sinusitis, unspecified: Secondary | ICD-10-CM

## 2013-01-10 MED ORDER — MOMETASONE FUROATE 50 MCG/ACT NA SUSP: NASAL | Status: DC

## 2013-01-12 ENCOUNTER — Ambulatory Visit (HOSPITAL_BASED_OUTPATIENT_CLINIC_OR_DEPARTMENT_OTHER)
Admission: RE | Admit: 2013-01-12 | Discharge: 2013-01-12 | Disposition: A | Discharge status: Home / Self Care | Payer: BC Managed Care – PPO | Source: Ambulatory Visit | Attending: Family Medicine | Admitting: Family Medicine

## 2013-01-12 DIAGNOSIS — Z1231 Encounter for screening mammogram for malignant neoplasm of breast: Secondary | ICD-10-CM | POA: Insufficient documentation

## 2013-02-04 ENCOUNTER — Encounter: Payer: Self-pay | Admitting: Family Medicine

## 2013-03-24 ENCOUNTER — Other Ambulatory Visit: Payer: Self-pay

## 2013-03-24 DIAGNOSIS — F419 Anxiety disorder, unspecified: Secondary | ICD-10-CM

## 2013-03-24 MED ORDER — ALPRAZOLAM 0.5 MG PO TABS: 0.5000 mg | ORAL_TABLET | Freq: Three times a day (TID) | ORAL | Status: DC | PRN

## 2013-03-24 MED ORDER — MECLIZINE HCL 25 MG PO CHEW: 1.0000 | CHEWABLE_TABLET | Freq: Four times a day (QID) | ORAL | Status: DC | PRN

## 2013-03-24 NOTE — Telephone Encounter (Signed)
Last seen and filled 01/06/13 #90. Please advise      KP

## 2013-04-25 ENCOUNTER — Other Ambulatory Visit: Payer: Self-pay | Admitting: Family Medicine

## 2013-04-29 ENCOUNTER — Other Ambulatory Visit: Payer: Self-pay

## 2013-04-29 DIAGNOSIS — F419 Anxiety disorder, unspecified: Secondary | ICD-10-CM

## 2013-04-29 MED ORDER — ALPRAZOLAM 0.5 MG PO TABS: 0.5000 mg | ORAL_TABLET | Freq: Three times a day (TID) | ORAL | Status: DC | PRN

## 2013-04-29 NOTE — Telephone Encounter (Signed)
Last seen 01/06/13 and filled 03/24/13 #90. Please advise     KP

## 2013-06-01 ENCOUNTER — Telehealth: Payer: Self-pay

## 2013-06-01 DIAGNOSIS — F419 Anxiety disorder, unspecified: Secondary | ICD-10-CM

## 2013-06-01 NOTE — Telephone Encounter (Signed)
Last seen 01/06/13 and filled 04/29/13 #90. Please advise KP

## 2013-06-02 MED ORDER — ALPRAZOLAM 0.5 MG PO TABS: 0.5000 mg | ORAL_TABLET | Freq: Three times a day (TID) | ORAL | Status: DC | PRN

## 2013-06-02 NOTE — Telephone Encounter (Signed)
Rx faxed.    KP 

## 2013-06-02 NOTE — Telephone Encounter (Signed)
If she is really taking 3 almost every day we may need to discuss adjusting meds- Refill x1

## 2013-06-16 ENCOUNTER — Ambulatory Visit: Payer: BC Managed Care – PPO | Admitting: Family Medicine

## 2013-06-23 ENCOUNTER — Ambulatory Visit (INDEPENDENT_AMBULATORY_CARE_PROVIDER_SITE_OTHER): Payer: BC Managed Care – PPO | Admitting: Family Medicine

## 2013-06-23 ENCOUNTER — Encounter: Payer: Self-pay | Admitting: Family Medicine

## 2013-06-23 VITALS — BP 114/88 | HR 70 | Temp 97.9°F | Wt 213.8 lb

## 2013-06-23 DIAGNOSIS — E785 Hyperlipidemia, unspecified: Secondary | ICD-10-CM

## 2013-06-23 DIAGNOSIS — F419 Anxiety disorder, unspecified: Secondary | ICD-10-CM

## 2013-06-23 DIAGNOSIS — M1990 Unspecified osteoarthritis, unspecified site: Secondary | ICD-10-CM

## 2013-06-23 DIAGNOSIS — R42 Dizziness and giddiness: Secondary | ICD-10-CM

## 2013-06-23 DIAGNOSIS — F411 Generalized anxiety disorder: Secondary | ICD-10-CM

## 2013-06-23 DIAGNOSIS — I10 Essential (primary) hypertension: Secondary | ICD-10-CM

## 2013-06-23 DIAGNOSIS — J329 Chronic sinusitis, unspecified: Secondary | ICD-10-CM

## 2013-06-23 DIAGNOSIS — G43909 Migraine, unspecified, not intractable, without status migrainosus: Secondary | ICD-10-CM

## 2013-06-23 LAB — POCT URINALYSIS DIPSTICK
Bilirubin, UA: NEGATIVE
Blood, UA: NEGATIVE
Glucose, UA: NEGATIVE
KETONES UA: NEGATIVE
LEUKOCYTES UA: NEGATIVE
NITRITE UA: NEGATIVE
PH UA: 7
Protein, UA: NEGATIVE
Spec Grav, UA: <=1.005
Urobilinogen, UA: 0.2

## 2013-06-23 LAB — CBC WITH DIFFERENTIAL/PLATELET
BASOS ABS: 0.1 10*3/uL (ref 0.0–0.1)
Basophils Relative: 1.1 % (ref 0.0–3.0)
EOS PCT: 4.3 % (ref 0.0–5.0)
Eosinophils Absolute: 0.4 10*3/uL (ref 0.0–0.7)
HEMATOCRIT: 43.1 % (ref 36.0–46.0)
Hemoglobin: 14.2 g/dL (ref 12.0–15.0)
LYMPHS ABS: 2.6 10*3/uL (ref 0.7–4.0)
Lymphocytes Relative: 28.8 % (ref 12.0–46.0)
MCHC: 32.9 g/dL (ref 30.0–36.0)
MCV: 86.9 fl (ref 78.0–100.0)
MONOS PCT: 5.7 % (ref 3.0–12.0)
Monocytes Absolute: 0.5 10*3/uL (ref 0.1–1.0)
NEUTROS PCT: 60.1 % (ref 43.0–77.0)
Neutro Abs: 5.5 10*3/uL (ref 1.4–7.7)
PLATELETS: 350 10*3/uL (ref 150.0–400.0)
RBC: 4.96 Mil/uL (ref 3.87–5.11)
RDW: 14.5 % (ref 11.5–14.6)
WBC: 9.2 10*3/uL (ref 4.5–10.5)

## 2013-06-23 LAB — LIPID PANEL
CHOL/HDL RATIO: 4
Cholesterol: 166 mg/dL (ref 0–200)
HDL: 36.9 mg/dL — AB (ref >39.00)
LDL CALC: 88 mg/dL (ref 0–99)
TRIGLYCERIDES: 206 mg/dL — AB (ref 0.0–149.0)
VLDL: 41.2 mg/dL — AB (ref 0.0–40.0)

## 2013-06-23 LAB — BASIC METABOLIC PANEL
BUN: 16 mg/dL (ref 6–23)
CALCIUM: 9.6 mg/dL (ref 8.4–10.5)
CO2: 31 meq/L (ref 19–32)
CREATININE: 0.8 mg/dL (ref 0.4–1.2)
Chloride: 103 mEq/L (ref 96–112)
GFR: 83.44 mL/min (ref >60.00)
Glucose, Bld: 82 mg/dL (ref 70–99)
Potassium: 4 mEq/L (ref 3.5–5.1)
SODIUM: 141 meq/L (ref 135–145)

## 2013-06-23 LAB — HEPATIC FUNCTION PANEL
ALT: 19 U/L (ref 0–35)
AST: 19 U/L (ref 0–37)
Albumin: 4.1 g/dL (ref 3.5–5.2)
Alkaline Phosphatase: 63 U/L (ref 39–117)
BILIRUBIN DIRECT: 0 mg/dL (ref 0.0–0.3)
Total Bilirubin: 0.3 mg/dL (ref 0.3–1.2)
Total Protein: 7.3 g/dL (ref 6.0–8.3)

## 2013-06-23 MED ORDER — MECLIZINE HCL 25 MG PO CHEW: 1.0000 | CHEWABLE_TABLET | Freq: Four times a day (QID) | ORAL | Status: DC | PRN

## 2013-06-23 MED ORDER — ALPRAZOLAM 0.5 MG PO TABS: 0.5000 mg | ORAL_TABLET | Freq: Three times a day (TID) | ORAL | Status: DC | PRN

## 2013-06-23 MED ORDER — SUMATRIPTAN SUCCINATE 100 MG PO TABS: ORAL_TABLET | ORAL | Status: DC

## 2013-06-23 MED ORDER — CLARITHROMYCIN ER 500 MG PO TB24: 1000.0000 mg | ORAL_TABLET | Freq: Every day | ORAL | Status: AC

## 2013-06-23 MED ORDER — CELECOXIB 200 MG PO CAPS: 200.0000 mg | ORAL_CAPSULE | Freq: Two times a day (BID) | ORAL | Status: DC

## 2013-06-23 NOTE — Progress Notes (Signed)
Pre visit review using our clinic review tool, if applicable. No additional management support is needed unless otherwise documented below in the visit note. 

## 2013-06-23 NOTE — Patient Instructions (Signed)

## 2013-06-23 NOTE — Progress Notes (Signed)
  Subjective:    Patient here for follow-up of elevated blood pressure.  She is not exercising and is adherent to a low-salt diet.  Blood pressure is well controlled at home. Cardiac symptoms: none. Patient denies: chest pain, chest pressure/discomfort, claudication, dyspnea, exertional chest pressure/discomfort, fatigue, irregular heart beat, lower extremity edema, near-syncope, orthopnea, palpitations, paroxysmal nocturnal dyspnea, syncope and tachypnea. Cardiovascular risk factors: dyslipidemia, hypertension, obesity (BMI >= 30 kg/m2) and sedentary lifestyle. Use of agents associated with hypertension: none. History of target organ damage: none.  Pt also here to f/u xanax-- her anxiety symptoms are stable.  She is having no trouble.   Pt c/o sinus pressure/ headache.  No fever. + congestion.  She is taking her flonase and antihistamine and using her inhalers.  The following portions of the patient's history were reviewed and updated as appropriate: allergies, current medications, past family history, past medical history, past social history, past surgical history and problem list.  Review of Systems Pertinent items are noted in HPI.     Objective:    BP 114/88  Pulse 70  Temp(Src) 97.9 F (36.6 C) (Oral)  Wt 213 lb 12.8 oz (96.979 kg)  SpO2 96% General appearance: alert, cooperative, appears stated age and no distress Ears: + fluid b/l Nose: green discharge, moderate congestion, turbinates red, swollen, sinus tenderness bilateral Throat: lips, mucosa, and tongue normal; teeth and gums normal Neck: moderate anterior cervical adenopathy, supple, symmetrical, trachea midline and thyroid not enlarged, symmetric, no tenderness/mass/nodules Lungs: clear to auscultation bilaterally Heart: S1, S2 normal Extremities: extremities normal, atraumatic, no cyanosis or edema Neurologic: Alert and oriented X 3, normal strength and tone. Normal symmetric reflexes. Normal coordination and gait     Assessment:    Hypertension, normal blood pressure . Evidence of target organ damage: none.    Plan:    Medication: no change. Dietary sodium restriction. Regular aerobic exercise. Check blood pressures 2-3 times weekly and record. Follow up: 3 months and as needed.   1. Anxiety stable - ALPRAZolam (XANAX) 0.5 MG tablet; Take 1 tablet (0.5 mg total) by mouth 3 (three) times daily as needed for sleep or anxiety. Office visit due now  Dispense: 90 tablet; Refill: 0  2. Vertigo occasional occurance - Meclizine HCl 25 MG CHEW; Chew 1 tablet (25 mg total) by mouth every 6 (six) hours as needed.  Dispense: 45 each; Refill: 0  3. Migraines stable - SUMAtriptan (IMITREX) 100 MG tablet; Take 1 tablet as need for Migraine and repeat in 2 hours prn  Dispense: 12 tablet; Refill: 2  4. HTN (hypertension) Stable, con't meds - Basic metabolic panel - Hepatic function panel - Lipid panel - CBC with Differential - POCT urinalysis dipstick  5. Osteoarthritis mobic stopped working ---try celebrex - celecoxib (CELEBREX) 200 MG capsule; Take 1 capsule (200 mg total) by mouth 2 (two) times daily. prn  Dispense: 60 capsule; Refill: 2  6. Other and unspecified hyperlipidemia Check labs - Basic metabolic panel - Hepatic function panel - Lipid panel - CBC with Differential - POCT urinalysis dipstick  7. Sinusitis con't steroid nasal spray and antihistamine abx sent to pharmacy - clarithromycin (BIAXIN XL) 500 MG 24 hr tablet; Take 2 tablets (1,000 mg total) by mouth daily.  Dispense: 28 tablet; Refill: 0

## 2013-06-24 ENCOUNTER — Telehealth: Payer: Self-pay | Admitting: Family Medicine

## 2013-06-24 NOTE — Telephone Encounter (Signed)
Relevant patient education assigned to patient using Emmi. ° °

## 2013-06-29 ENCOUNTER — Encounter: Payer: Self-pay | Admitting: Family Medicine

## 2013-06-29 ENCOUNTER — Telehealth: Payer: Self-pay | Admitting: Family Medicine

## 2013-06-29 NOTE — Telephone Encounter (Signed)
Prior authorization paperwork printed and faxed. Urgent response requested. JG//CMA

## 2013-06-29 NOTE — Telephone Encounter (Signed)
Patient states we should have received a request for prior auth for celebrex. She is completely out of the medication and she states it helps her tremendously. Patient would like to know if there are any alternatives other than mobic and naproxen. Pt uses [facility].

## 2013-06-29 NOTE — Telephone Encounter (Signed)
Deborah Watts,   Please advise if we have received PA for celebrex for this pt.

## 2013-06-30 ENCOUNTER — Encounter: Payer: Self-pay | Admitting: Family Medicine

## 2013-06-30 MED ORDER — MELOXICAM 15 MG PO TABS: 15.0000 mg | ORAL_TABLET | Freq: Every day | ORAL | Status: DC

## 2013-06-30 NOTE — Addendum Note (Signed)
Addended by: Arnette NorrisPAYNE, Dannon Nguyenthi P on: 06/30/2013 02:18 PM   Modules accepted: Orders

## 2013-07-01 NOTE — Telephone Encounter (Signed)
Prior authorization for Celebrex approved effective 05/31/2013 through 06/30/2014. Patient aware. JG//CMA

## 2013-08-24 ENCOUNTER — Telehealth: Payer: Self-pay

## 2013-08-24 DIAGNOSIS — F419 Anxiety disorder, unspecified: Secondary | ICD-10-CM

## 2013-08-24 MED ORDER — ALPRAZOLAM 0.5 MG PO TABS: ORAL_TABLET | ORAL | Status: DC

## 2013-08-24 NOTE — Telephone Encounter (Signed)
Rx sent      KP 

## 2013-08-24 NOTE — Telephone Encounter (Signed)
Refill x1 

## 2013-08-24 NOTE — Telephone Encounter (Signed)
Last seen and filled 06/23/13 #90. Please advise     KP

## 2013-09-06 ENCOUNTER — Other Ambulatory Visit: Payer: Self-pay

## 2013-09-06 DIAGNOSIS — R42 Dizziness and giddiness: Secondary | ICD-10-CM

## 2013-09-06 MED ORDER — MECLIZINE HCL 25 MG PO CHEW: 1.0000 | CHEWABLE_TABLET | Freq: Four times a day (QID) | ORAL | Status: DC | PRN

## 2013-09-07 ENCOUNTER — Encounter: Payer: Self-pay | Admitting: Family Medicine

## 2013-09-07 ENCOUNTER — Ambulatory Visit (INDEPENDENT_AMBULATORY_CARE_PROVIDER_SITE_OTHER): Payer: BC Managed Care – PPO | Admitting: Family Medicine

## 2013-09-07 VITALS — BP 118/80 | HR 94 | Temp 98.3°F | Wt 209.0 lb

## 2013-09-07 DIAGNOSIS — J0100 Acute maxillary sinusitis, unspecified: Secondary | ICD-10-CM

## 2013-09-07 DIAGNOSIS — J209 Acute bronchitis, unspecified: Secondary | ICD-10-CM

## 2013-09-07 DIAGNOSIS — R42 Dizziness and giddiness: Secondary | ICD-10-CM

## 2013-09-07 MED ORDER — ALBUTEROL SULFATE (2.5 MG/3ML) 0.083% IN NEBU
2.5000 mg | INHALATION_SOLUTION | Freq: Once | RESPIRATORY_TRACT | Status: AC
  Administered 2013-09-07: 2.5 mg via RESPIRATORY_TRACT

## 2013-09-07 MED ORDER — AZITHROMYCIN 250 MG PO TABS: ORAL_TABLET | ORAL | Status: DC

## 2013-09-07 MED ORDER — PREDNISONE 10 MG PO TABS: ORAL_TABLET | ORAL | Status: DC

## 2013-09-07 MED ORDER — MECLIZINE HCL 25 MG PO CHEW: 1.0000 | CHEWABLE_TABLET | Freq: Four times a day (QID) | ORAL | Status: DC | PRN

## 2013-09-07 MED ORDER — METHYLPREDNISOLONE ACETATE 80 MG/ML IJ SUSP
80.0000 mg | Freq: Once | INTRAMUSCULAR | Status: AC
  Administered 2013-09-07: 80 mg via INTRAMUSCULAR

## 2013-09-07 NOTE — Progress Notes (Signed)
  Subjective:     Deborah Watts is a 48 y.o. female who presents for evaluation of possible sinusitis. Symptoms include congestion, nasal congestion, purulent nasal discharge, sinus pressure, sore throat and wheezing. Onset of symptoms was several days ago, and has been gradually worsening since that time. Treatment to date: inhalers.  The following portions of the patient's history were reviewed and updated as appropriate: allergies, current medications, past family history, past medical history, past social history, past surgical history and problem list.  Review of Systems Pertinent items are noted in HPI.   Objective:    BP 118/80  Pulse 94  Temp(Src) 98.3 F (36.8 C) (Oral)  Wt 209 lb (94.802 kg)  SpO2 97% General appearance: alert, cooperative, appears stated age and no distress Ears: normal TM's and external ear canals both ears Nose: green discharge, moderate congestion, turbinates red, swollen, sinus tenderness bilateral Throat: lips, mucosa, and tongue normal; teeth and gums normal Neck: mild anterior cervical adenopathy, supple, symmetrical, trachea midline and thyroid not enlarged, symmetric, no tenderness/mass/nodules Lungs: wheezes bilaterally Heart: S1, S2 normal   Assessment:    asthma and sinusitis   Plan:    Discussed the diagnosis and treatment of sinusitis. Suggested symptomatic OTC remedies. Zithromax per orders. Nasal steroids per orders. Follow up as needed. depo medrol

## 2013-09-07 NOTE — Patient Instructions (Signed)
Bronchitis  Bronchitis is inflammation of the airways that extend from the windpipe into the lungs (bronchi). The inflammation often causes mucus to develop, which leads to a cough. If the inflammation becomes severe, it may cause shortness of breath.  CAUSES   Bronchitis may be caused by:    Viral infections.    Bacteria.    Cigarette smoke.    Allergens, pollutants, and other irritants.   SIGNS AND SYMPTOMS   The most common symptom of bronchitis is a frequent cough that produces mucus. Other symptoms include:   Fever.    Body aches.    Chest congestion.    Chills.    Shortness of breath.    Sore throat.   DIAGNOSIS   Bronchitis is usually diagnosed through a medical history and physical exam. Tests, such as chest X-rays, are sometimes done to rule out other conditions.   TREATMENT   You may need to avoid contact with whatever caused the problem (smoking, for example). Medicines are sometimes needed. These may include:   Antibiotics. These may be prescribed if the condition is caused by bacteria.   Cough suppressants. These may be prescribed for relief of cough symptoms.    Inhaled medicines. These may be prescribed to help open your airways and make it easier for you to breathe.    Steroid medicines. These may be prescribed for those with recurrent (chronic) bronchitis.  HOME CARE INSTRUCTIONS   Get plenty of rest.    Drink enough fluids to keep your urine clear or pale yellow (unless you have a medical condition that requires fluid restriction). Increasing fluids may help thin your secretions and will prevent dehydration.    Only take over-the-counter or prescription medicines as directed by your health care provider.   Only take antibiotics as directed. Make sure you finish them even if you start to feel better.   Avoid secondhand smoke, irritating chemicals, and strong fumes. These will make bronchitis worse. If you are a smoker, quit smoking. Consider using nicotine gum or  skin patches to help control withdrawal symptoms. Quitting smoking will help your lungs heal faster.    Put a cool-mist humidifier in your bedroom at night to moisten the air. This may help loosen mucus. Change the water in the humidifier daily. You can also run the hot water in your shower and sit in the bathroom with the door closed for 5-10 minutes.    Follow up with your health care provider as directed.    Wash your hands frequently to avoid catching bronchitis again or spreading an infection to others.   SEEK MEDICAL CARE IF:  Your symptoms do not improve after 1 week of treatment.   SEEK IMMEDIATE MEDICAL CARE IF:   Your fever increases.   You have chills.    You have chest pain.    You have worsening shortness of breath.    You have bloody sputum.   You faint.   You have lightheadedness.   You have a severe headache.    You vomit repeatedly.  MAKE SURE YOU:    Understand these instructions.   Will watch your condition.   Will get help right away if you are not doing well or get worse.  Document Released: 03/03/2005 Document Revised: 12/22/2012 Document Reviewed: 10/26/2012  [employer] Patient Information 2015 [employer]. This information is not intended to replace advice given to you by your health care provider. Make sure you discuss any questions you have with your health care   provider.

## 2013-09-07 NOTE — Progress Notes (Signed)
Pre visit review using our clinic review tool, if applicable. No additional management support is needed unless otherwise documented below in the visit note. 

## 2013-09-28 ENCOUNTER — Telehealth: Payer: Self-pay

## 2013-09-28 DIAGNOSIS — F419 Anxiety disorder, unspecified: Secondary | ICD-10-CM

## 2013-09-28 MED ORDER — ALPRAZOLAM 0.5 MG PO TABS: ORAL_TABLET | ORAL | Status: DC

## 2013-09-28 NOTE — Telephone Encounter (Signed)
Refill x1 

## 2013-09-28 NOTE — Telephone Encounter (Signed)
Rx faxed.    KP 

## 2013-09-28 NOTE — Telephone Encounter (Signed)
Last seen 09/07/13 and filled 08/24/13 #90.  UDS 06/23/12 Low risk  Please advise    KP

## 2013-10-27 ENCOUNTER — Other Ambulatory Visit: Payer: Self-pay | Admitting: Family Medicine

## 2013-11-04 ENCOUNTER — Other Ambulatory Visit: Payer: Self-pay | Admitting: Family Medicine

## 2013-11-04 NOTE — Telephone Encounter (Signed)
Last seen 09/07/13 and filled 09/28/13 #90.  Please advise      KP

## 2013-12-19 ENCOUNTER — Telehealth: Payer: Self-pay | Admitting: Family Medicine

## 2013-12-19 NOTE — Telephone Encounter (Addendum)
Patient aware and voiced understanding that she will be ok to continue to receive med's since she has a pending apt.    KP

## 2013-12-19 NOTE — Telephone Encounter (Signed)
Caller name: Marchelle Folksmanda Relation to pt: self Call back number: [phone] Pharmacy:  Reason for call:   Patient scheduled cpe appointment for 03/20/14 but wanted to know if she could continue getting xanax refills until she is seen

## 2014-01-11 ENCOUNTER — Telehealth: Payer: Self-pay | Admitting: Family Medicine

## 2014-01-11 DIAGNOSIS — I10 Essential (primary) hypertension: Secondary | ICD-10-CM

## 2014-01-11 DIAGNOSIS — K219 Gastro-esophageal reflux disease without esophagitis: Secondary | ICD-10-CM

## 2014-01-11 DIAGNOSIS — J45909 Unspecified asthma, uncomplicated: Secondary | ICD-10-CM

## 2014-01-11 MED ORDER — MONTELUKAST SODIUM 10 MG PO TABS: 10.0000 mg | ORAL_TABLET | Freq: Every day | ORAL | Status: DC

## 2014-01-11 MED ORDER — POTASSIUM CHLORIDE CRYS ER 10 MEQ PO TBCR: EXTENDED_RELEASE_TABLET | ORAL | Status: DC

## 2014-01-11 MED ORDER — LISINOPRIL-HYDROCHLOROTHIAZIDE 10-12.5 MG PO TABS: ORAL_TABLET | ORAL | Status: DC

## 2014-01-11 NOTE — Telephone Encounter (Signed)
Caller name: Annice PihJackie Relation to pt: pharmacist Call back number: [phone] [facility] [place]  Reason for call:  Needing refiils on rxs montelukast (SINGULAIR) 10 MG tablet  lisinopril-hydrochlorothiazide (PRINZIDE,ZESTORETIC) 10-12.5 MG per tablet  KLOR-CON M10 10 MEQ tablet

## 2014-01-24 ENCOUNTER — Other Ambulatory Visit: Payer: Self-pay | Admitting: Family Medicine

## 2014-01-24 NOTE — Telephone Encounter (Signed)
Last seen 09/07/13 and filled 11/04/13 #90 with 1 refill.  UDS 06/23/13  Please advise     KP

## 2014-01-30 ENCOUNTER — Other Ambulatory Visit: Payer: Self-pay | Admitting: Family Medicine

## 2014-03-02 ENCOUNTER — Other Ambulatory Visit: Payer: Self-pay | Admitting: Family Medicine

## 2014-03-02 DIAGNOSIS — G43009 Migraine without aura, not intractable, without status migrainosus: Secondary | ICD-10-CM

## 2014-03-02 MED ORDER — SUMATRIPTAN SUCCINATE 100 MG PO TABS: ORAL_TABLET | ORAL | Status: DC

## 2014-03-02 NOTE — Telephone Encounter (Signed)
Med filled.  

## 2014-03-02 NOTE — Telephone Encounter (Signed)
refilled 

## 2014-03-20 ENCOUNTER — Encounter: Payer: Self-pay | Admitting: Family Medicine

## 2014-03-20 ENCOUNTER — Ambulatory Visit (INDEPENDENT_AMBULATORY_CARE_PROVIDER_SITE_OTHER): Payer: BC Managed Care – PPO | Admitting: Family Medicine

## 2014-03-20 VITALS — BP 118/74 | HR 79 | Temp 98.0°F | Ht 61.0 in | Wt 208.0 lb

## 2014-03-20 DIAGNOSIS — Z8669 Personal history of other diseases of the nervous system and sense organs: Secondary | ICD-10-CM

## 2014-03-20 DIAGNOSIS — J45909 Unspecified asthma, uncomplicated: Secondary | ICD-10-CM

## 2014-03-20 DIAGNOSIS — I10 Essential (primary) hypertension: Secondary | ICD-10-CM

## 2014-03-20 DIAGNOSIS — Z0000 Encounter for general adult medical examination without abnormal findings: Secondary | ICD-10-CM

## 2014-03-20 DIAGNOSIS — E876 Hypokalemia: Secondary | ICD-10-CM

## 2014-03-20 LAB — CBC WITH DIFFERENTIAL/PLATELET
BASOS PCT: 0.7 % (ref 0.0–3.0)
Basophils Absolute: 0.1 10*3/uL (ref 0.0–0.1)
EOS ABS: 0.4 10*3/uL (ref 0.0–0.7)
Eosinophils Relative: 3.1 % (ref 0.0–5.0)
HCT: 44 % (ref 36.0–46.0)
Hemoglobin: 14.4 g/dL (ref 12.0–15.0)
Lymphocytes Relative: 26.5 % (ref 12.0–46.0)
Lymphs Abs: 3 10*3/uL (ref 0.7–4.0)
MCHC: 32.7 g/dL (ref 30.0–36.0)
MCV: 86.4 fl (ref 78.0–100.0)
MONOS PCT: 5.9 % (ref 3.0–12.0)
Monocytes Absolute: 0.7 10*3/uL (ref 0.1–1.0)
NEUTROS PCT: 63.8 % (ref 43.0–77.0)
Neutro Abs: 7.3 10*3/uL (ref 1.4–7.7)
Platelets: 385 10*3/uL (ref 150.0–400.0)
RBC: 5.09 Mil/uL (ref 3.87–5.11)
RDW: 14.1 % (ref 11.5–15.5)
WBC: 11.4 10*3/uL — ABNORMAL HIGH (ref 4.0–10.5)

## 2014-03-20 LAB — BASIC METABOLIC PANEL
BUN: 11 mg/dL (ref 6–23)
CHLORIDE: 102 meq/L (ref 96–112)
CO2: 30 meq/L (ref 19–32)
Calcium: 9.5 mg/dL (ref 8.4–10.5)
Creatinine, Ser: 0.8 mg/dL (ref 0.4–1.2)
GFR: 87.03 mL/min (ref >60.00)
Glucose, Bld: 90 mg/dL (ref 70–99)
Potassium: 3.9 mEq/L (ref 3.5–5.1)
Sodium: 140 mEq/L (ref 135–145)

## 2014-03-20 LAB — LIPID PANEL
Cholesterol: 178 mg/dL (ref 0–200)
HDL: 34.1 mg/dL — ABNORMAL LOW (ref >39.00)
NonHDL: 143.9
Total CHOL/HDL Ratio: 5
Triglycerides: 227 mg/dL — ABNORMAL HIGH (ref 0.0–149.0)
VLDL: 45.4 mg/dL — ABNORMAL HIGH (ref 0.0–40.0)

## 2014-03-20 LAB — HEPATIC FUNCTION PANEL
ALT: 21 U/L (ref 0–35)
AST: 18 U/L (ref 0–37)
Albumin: 4.3 g/dL (ref 3.5–5.2)
Alkaline Phosphatase: 61 U/L (ref 39–117)
BILIRUBIN DIRECT: 0 mg/dL (ref 0.0–0.3)
TOTAL PROTEIN: 7.5 g/dL (ref 6.0–8.3)
Total Bilirubin: 0.4 mg/dL (ref 0.2–1.2)

## 2014-03-20 LAB — TSH: TSH: 1.34 u[IU]/mL (ref 0.35–4.50)

## 2014-03-20 MED ORDER — POTASSIUM CHLORIDE CRYS ER 10 MEQ PO TBCR: EXTENDED_RELEASE_TABLET | ORAL | Status: DC

## 2014-03-20 MED ORDER — MONTELUKAST SODIUM 10 MG PO TABS: 10.0000 mg | ORAL_TABLET | Freq: Every day | ORAL | Status: DC

## 2014-03-20 MED ORDER — LISINOPRIL-HYDROCHLOROTHIAZIDE 10-12.5 MG PO TABS: ORAL_TABLET | ORAL | Status: DC

## 2014-03-20 MED ORDER — TOPIRAMATE 50 MG PO TABS: 50.0000 mg | ORAL_TABLET | Freq: Two times a day (BID) | ORAL | Status: DC

## 2014-03-20 MED ORDER — TOPIRAMATE 25 MG PO TABS: ORAL_TABLET | ORAL | Status: DC

## 2014-03-20 NOTE — Progress Notes (Signed)
Pre visit review using our clinic review tool, if applicable. No additional management support is needed unless otherwise documented below in the visit note. 

## 2014-03-20 NOTE — Progress Notes (Signed)
Subjective:     Deborah Watts is a 49 y.o. female and is here for a comprehensive physical exam. The patient reports no problems.  History   Social History  . Marital Status: Married    Spouse Name: N/A    Number of Children: N/A  . Years of Education: N/A   Occupational History  . [employer]    Social History Main Topics  . Smoking status: Never Smoker   . Smokeless tobacco: Never Used  . Alcohol Use: 0.0 oz/week     Comment: occassional  . Drug Use: No  . Sexual Activity:    Partners: Male   Other Topics Concern  . Not on file   Social History Narrative   Exercise-- no   Health Maintenance  Topic Date Due  . INFLUENZA VACCINE  10/16/2014  . PAP SMEAR  01/07/2016  . TETANUS/TDAP  12/24/2023    The following portions of the patient's history were reviewed and updated as appropriate:  She  has a past medical history of Asthma; Anxiety; MHA (microangiopathic hemolytic anemia); and Hypertension. She  does not have any pertinent problems on file. She  has no past surgical history on file. Her family history includes Asthma in an other family member; Diabetes in an other family member; Hyperlipidemia in an other family member; Throat cancer in her father. She  reports that she has never smoked. She has never used smokeless tobacco. She reports that she drinks alcohol. She reports that she does not use illicit drugs. She has a current medication list which includes the following prescription(s): alprazolam, celecoxib, lisinopril-hydrochlorothiazide, loratadine, meclizine hcl, mometasone, mometasone-formoterol, montelukast, potassium chloride, prednisone, proair hfa, promethazine, sumatriptan, topiramate, and topiramate. Current Outpatient Prescriptions on File Prior to Visit  Medication Sig Dispense Refill  . ALPRAZolam (XANAX) 0.5 MG tablet TAKE 1 TABLET BY MOUTH 3 TIMES DAILY FORSLEEP OR ANXIETY 90 tablet 1  . celecoxib (CELEBREX) 200 MG capsule TAKE 1 CAPSULE BY MOUTH TWICE  DAILY AS NEEDED 60 capsule 2  . loratadine (CLARITIN) 10 MG tablet Take 10 mg by mouth daily.    . Meclizine HCl 25 MG CHEW Chew 1 tablet (25 mg total) by mouth every 6 (six) hours as needed. 45 each 0  . mometasone (NASONEX) 50 MCG/ACT nasal spray 2 sprays each nostril qd 17 g 5  . mometasone-formoterol (DULERA) 200-5 MCG/ACT AERO Inhale 2 puffs into the lungs 2 (two) times daily. 1 Inhaler 11  . predniSONE (DELTASONE) 10 MG tablet 3 po qd for 3 days then 2 po qd for 3 days the 1 po qd for 3 days 18 tablet 0  . PROAIR HFA 108 (90 BASE) MCG/ACT inhaler INHALE 2 PUFFS INTO THE LUNGS EVERY 6 HOURS AS NEEDED FOR WHEEZING 8.5 g 2  . promethazine (PHENERGAN) 25 MG tablet Take 1 tablet (25 mg total) by mouth every 6 (six) hours as needed for nausea. 30 tablet 0  . SUMAtriptan (IMITREX) 100 MG tablet TAKE 1 TABLET BY MOUTH AS NEEDED FOR MIGRAINE AND REPEAT IN 2 HOURS AS NEEDED 12 tablet 0   No current facility-administered medications on file prior to visit.   She is allergic to prednisone; iohexol; ivp dye; and penicillins..  Review of Systems Review of Systems  Constitutional: Negative for activity change, appetite change and fatigue.  HENT: Negative for hearing loss, congestion, tinnitus and ear discharge.  dentist q79m Eyes: Negative for visual disturbance (see optho q1y -- vision corrected to 20/20 with glasses).  Respiratory: Negative for  cough, chest tightness and shortness of breath.   Cardiovascular: Negative for chest pain, palpitations and leg swelling.  Gastrointestinal: Negative for abdominal pain, diarrhea, constipation and abdominal distention.  Genitourinary: Negative for urgency, frequency, decreased urine volume and difficulty urinating.  Musculoskeletal: Negative for back pain, arthralgias and gait problem.  Skin: Negative for color change, pallor and rash.  Neurological: Negative for dizziness, light-headedness, numbness and headaches.  Hematological: Negative for adenopathy.  Does not bruise/bleed easily.  Psychiatric/Behavioral: Negative for suicidal ideas, confusion, sleep disturbance, self-injury, dysphoric mood, decreased concentration and agitation.      Objective:    BP 118/74 mmHg  Pulse 79  Temp(Src) 98 F (36.7 C) (Oral)  Ht  (1.549 m)  Wt 208 lb (94.348 kg)  BMI 39.32 kg/m2  SpO2 97% General appearance: alert, cooperative, appears stated age and no distress Head: Normocephalic, without obvious abnormality, atraumatic Eyes: conjunctivae/corneas clear. PERRL, EOM's intact. Fundi benign. Ears: normal TM's and external ear canals both ears Nose: Nares normal. Septum midline. Mucosa normal. No drainage or sinus tenderness. Throat: lips, mucosa, and tongue normal; teeth and gums normal Neck: no adenopathy, no carotid bruit, no JVD, supple, symmetrical, trachea midline and thyroid not enlarged, symmetric, no tenderness/mass/nodules Back: symmetric, no curvature. ROM normal. No CVA tenderness. Lungs: clear to auscultation bilaterally Breasts: normal appearance, no masses or tenderness Heart: regular rate and rhythm, S1, S2 normal, no murmur, click, rub or gallop Abdomen: soft, non-tender; bowel sounds normal; no masses,  no organomegaly Pelvic: not indicated; status post hysterectomy, negative ROS Extremities: extremities normal, atraumatic, no cyanosis or edema Pulses: 2+ and symmetric Skin: Skin color, texture, turgor normal. No rashes or lesions Lymph nodes: Cervical, supraclavicular, and axillary nodes normal. Neurologic: Alert and oriented X 3, normal strength and tone. Normal symmetric reflexes. Normal coordination and gait    Assessment:    Healthy female exam.      Plan:    ghm utd Check labs See After Visit Summary for Counseling Recommendations    1. Hx of migraines F/u 3 months or sooner prn  - topiramate (TOPAMAX) 25 MG tablet; 1 po qhs x 1 week then 1 po bid for 1 week then 1 po qam and 2 po qhs x 1 week  Dispense: 42  tablet; Refill: 0 - topiramate (TOPAMAX) 50 MG tablet; Take 1 tablet (50 mg total) by mouth 2 (two) times daily.  Dispense: 180 tablet; Refill: 3  2. Essential hypertension Stable-- con't meds - lisinopril-hydrochlorothiazide (PRINZIDE,ZESTORETIC) 10-12.5 MG per tablet; TAKE ONE TABLET BY MOUTH ONCE DAILY  Dispense: 90 tablet; Refill: 1 - Basic metabolic panel - CBC with Differential - Hepatic function panel - Lipid panel - POCT urinalysis dipstick - TSH  3. Asthma, chronic, unspecified asthma severity, uncomplicated stable - montelukast (SINGULAIR) 10 MG tablet; Take 1 tablet (10 mg total) by mouth at bedtime.  Dispense: 90 tablet; Refill: 3  4. Hypokalemia Check labs - potassium chloride (KLOR-CON M10) 10 MEQ tablet; TAKE ONE TABLET BY MOUTH TWICE DAILY  Dispense: 180 tablet; Refill: 0 - Basic metabolic panel  5. Preventative health care

## 2014-03-20 NOTE — Patient Instructions (Signed)
Preventive Care for Adults A healthy lifestyle and preventive care can promote health and wellness. Preventive health guidelines for women include the following key practices.  A routine yearly physical is a good way to check with your health care provider about your health and preventive screening. It is a chance to share any concerns and updates on your health and to receive a thorough exam.  Visit your dentist for a routine exam and preventive care every 6 months. Brush your teeth twice a day and floss once a day. Good oral hygiene prevents tooth decay and gum disease.  The frequency of eye exams is based on your age, health, family medical history, use of contact lenses, and other factors. Follow your health care provider's recommendations for frequency of eye exams.  Eat a healthy diet. Foods like vegetables, fruits, whole grains, low-fat dairy products, and lean protein foods contain the nutrients you need without too many calories. Decrease your intake of foods high in solid fats, added sugars, and salt. Eat the right amount of calories for you.Get information about a proper diet from your health care provider, if necessary.  Regular physical exercise is one of the most important things you can do for your health. Most adults should get at least 150 minutes of moderate-intensity exercise (any activity that increases your heart rate and causes you to sweat) each week. In addition, most adults need muscle-strengthening exercises on 2 or more days a week.  Maintain a healthy weight. The body mass index (BMI) is a screening tool to identify possible weight problems. It provides an estimate of body fat based on height and weight. Your health care provider can find your BMI and can help you achieve or maintain a healthy weight.For adults 20 years and older:  A BMI below 18.5 is considered underweight.  A BMI of 18.5 to 24.9 is normal.  A BMI of 25 to 29.9 is considered overweight.  A BMI of  30 and above is considered obese.  Maintain normal blood lipids and cholesterol levels by exercising and minimizing your intake of saturated fat. Eat a balanced diet with plenty of fruit and vegetables. Blood tests for lipids and cholesterol should begin at age 76 and be repeated every 5 years. If your lipid or cholesterol levels are high, you are over 50, or you are at high risk for heart disease, you may need your cholesterol levels checked more frequently.Ongoing high lipid and cholesterol levels should be treated with medicines if diet and exercise are not working.  If you smoke, find out from your health care provider how to quit. If you do not use tobacco, do not start.  Lung cancer screening is recommended for adults aged 22-80 years who are at high risk for developing lung cancer because of a history of smoking. A yearly low-dose CT scan of the lungs is recommended for people who have at least a 30-pack-year history of smoking and are a current smoker or have quit within the past 15 years. A pack year of smoking is smoking an average of 1 pack of cigarettes a day for 1 year (for example: 1 pack a day for 30 years or 2 packs a day for 15 years). Yearly screening should continue until the smoker has stopped smoking for at least 15 years. Yearly screening should be stopped for people who develop a health problem that would prevent them from having lung cancer treatment.  If you are pregnant, do not drink alcohol. If you are breastfeeding,  be very cautious about drinking alcohol. If you are not pregnant and choose to drink alcohol, do not have more than 1 drink per day. One drink is considered to be 12 ounces (355 mL) of beer, 5 ounces (148 mL) of wine, or 1.5 ounces (44 mL) of liquor.  Avoid use of street drugs. Do not share needles with anyone. Ask for help if you need support or instructions about stopping the use of drugs.  High blood pressure causes heart disease and increases the risk of  stroke. Your blood pressure should be checked at least every 1 to 2 years. Ongoing high blood pressure should be treated with medicines if weight loss and exercise do not work.  If you are 3-86 years old, ask your health care provider if you should take aspirin to prevent strokes.  Diabetes screening involves taking a blood sample to check your fasting blood sugar level. This should be done once every 3 years, after age 67, if you are within normal weight and without risk factors for diabetes. Testing should be considered at a younger age or be carried out more frequently if you are overweight and have at least 1 risk factor for diabetes.  Breast cancer screening is essential preventive care for women. You should practice "breast self-awareness." This means understanding the normal appearance and feel of your breasts and may include breast self-examination. Any changes detected, no matter how small, should be reported to a health care provider. Women in their 8s and 30s should have a clinical breast exam (CBE) by a health care provider as part of a regular health exam every 1 to 3 years. After age 70, women should have a CBE every year. Starting at age 25, women should consider having a mammogram (breast X-ray test) every year. Women who have a family history of breast cancer should talk to their health care provider about genetic screening. Women at a high risk of breast cancer should talk to their health care providers about having an MRI and a mammogram every year.  Breast cancer gene (BRCA)-related cancer risk assessment is recommended for women who have family members with BRCA-related cancers. BRCA-related cancers include breast, ovarian, tubal, and peritoneal cancers. Having family members with these cancers may be associated with an increased risk for harmful changes (mutations) in the breast cancer genes BRCA1 and BRCA2. Results of the assessment will determine the need for genetic counseling and  BRCA1 and BRCA2 testing.  Routine pelvic exams to screen for cancer are no longer recommended for nonpregnant women who are considered low risk for cancer of the pelvic organs (ovaries, uterus, and vagina) and who do not have symptoms. Ask your health care provider if a screening pelvic exam is right for you.  If you have had past treatment for cervical cancer or a condition that could lead to cancer, you need Pap tests and screening for cancer for at least 20 years after your treatment. If Pap tests have been discontinued, your risk factors (such as having a new sexual partner) need to be reassessed to determine if screening should be resumed. Some women have medical problems that increase the chance of getting cervical cancer. In these cases, your health care provider may recommend more frequent screening and Pap tests.  The HPV test is an additional test that may be used for cervical cancer screening. The HPV test looks for the virus that can cause the cell changes on the cervix. The cells collected during the Pap test can be  tested for HPV. The HPV test could be used to screen women aged 30 years and older, and should be used in women of any age who have unclear Pap test results. After the age of 30, women should have HPV testing at the same frequency as a Pap test.  Colorectal cancer can be detected and often prevented. Most routine colorectal cancer screening begins at the age of 50 years and continues through age 75 years. However, your health care provider may recommend screening at an earlier age if you have risk factors for colon cancer. On a yearly basis, your health care provider may provide home test kits to check for hidden blood in the stool. Use of a small camera at the end of a tube, to directly examine the colon (sigmoidoscopy or colonoscopy), can detect the earliest forms of colorectal cancer. Talk to your health care provider about this at age 50, when routine screening begins. Direct  exam of the colon should be repeated every 5-10 years through age 75 years, unless early forms of pre-cancerous polyps or small growths are found.  People who are at an increased risk for hepatitis B should be screened for this virus. You are considered at high risk for hepatitis B if:  You were born in a country where hepatitis B occurs often. Talk with your health care provider about which countries are considered high risk.  Your parents were born in a high-risk country and you have not received a shot to protect against hepatitis B (hepatitis B vaccine).  You have HIV or AIDS.  You use needles to inject street drugs.  You live with, or have sex with, someone who has hepatitis B.  You get hemodialysis treatment.  You take certain medicines for conditions like cancer, organ transplantation, and autoimmune conditions.  Hepatitis C blood testing is recommended for all people born from 1945 through 1965 and any individual with known risks for hepatitis C.  Practice safe sex. Use condoms and avoid high-risk sexual practices to reduce the spread of sexually transmitted infections (STIs). STIs include gonorrhea, chlamydia, syphilis, trichomonas, herpes, HPV, and human immunodeficiency virus (HIV). Herpes, HIV, and HPV are viral illnesses that have no cure. They can result in disability, cancer, and death.  You should be screened for sexually transmitted illnesses (STIs) including gonorrhea and chlamydia if:  You are sexually active and are younger than 24 years.  You are older than 24 years and your health care provider tells you that you are at risk for this type of infection.  Your sexual activity has changed since you were last screened and you are at an increased risk for chlamydia or gonorrhea. Ask your health care provider if you are at risk.  If you are at risk of being infected with HIV, it is recommended that you take a prescription medicine daily to prevent HIV infection. This is  called preexposure prophylaxis (PrEP). You are considered at risk if:  You are a heterosexual woman, are sexually active, and are at increased risk for HIV infection.  You take drugs by injection.  You are sexually active with a partner who has HIV.  Talk with your health care provider about whether you are at high risk of being infected with HIV. If you choose to begin PrEP, you should first be tested for HIV. You should then be tested every 3 months for as long as you are taking PrEP.  Osteoporosis is a disease in which the bones lose minerals and strength   with aging. This can result in serious bone fractures or breaks. The risk of osteoporosis can be identified using a bone density scan. Women ages 65 years and over and women at risk for fractures or osteoporosis should discuss screening with their health care providers. Ask your health care provider whether you should take a calcium supplement or vitamin D to reduce the rate of osteoporosis.  Menopause can be associated with physical symptoms and risks. Hormone replacement therapy is available to decrease symptoms and risks. You should talk to your health care provider about whether hormone replacement therapy is right for you.  Use sunscreen. Apply sunscreen liberally and repeatedly throughout the day. You should seek shade when your shadow is shorter than you. Protect yourself by wearing long sleeves, pants, a wide-brimmed hat, and sunglasses year round, whenever you are outdoors.  Once a month, do a whole body skin exam, using a mirror to look at the skin on your back. Tell your health care provider of new moles, moles that have irregular borders, moles that are larger than a pencil eraser, or moles that have changed in shape or color.  Stay current with required vaccines (immunizations).  Influenza vaccine. All adults should be immunized every year.  Tetanus, diphtheria, and acellular pertussis (Td, Tdap) vaccine. Pregnant women should  receive 1 dose of Tdap vaccine during each pregnancy. The dose should be obtained regardless of the length of time since the last dose. Immunization is preferred during the 27th-36th week of gestation. An adult who has not previously received Tdap or who does not know her vaccine status should receive 1 dose of Tdap. This initial dose should be followed by tetanus and diphtheria toxoids (Td) booster doses every 10 years. Adults with an unknown or incomplete history of completing a 3-dose immunization series with Td-containing vaccines should begin or complete a primary immunization series including a Tdap dose. Adults should receive a Td booster every 10 years.  Varicella vaccine. An adult without evidence of immunity to varicella should receive 2 doses or a second dose if she has previously received 1 dose. Pregnant females who do not have evidence of immunity should receive the first dose after pregnancy. This first dose should be obtained before leaving the health care facility. The second dose should be obtained 4-8 weeks after the first dose.  Human papillomavirus (HPV) vaccine. Females aged 13-26 years who have not received the vaccine previously should obtain the 3-dose series. The vaccine is not recommended for use in pregnant females. However, pregnancy testing is not needed before receiving a dose. If a female is found to be pregnant after receiving a dose, no treatment is needed. In that case, the remaining doses should be delayed until after the pregnancy. Immunization is recommended for any person with an immunocompromised condition through the age of 26 years if she did not get any or all doses earlier. During the 3-dose series, the second dose should be obtained 4-8 weeks after the first dose. The third dose should be obtained 24 weeks after the first dose and 16 weeks after the second dose.  Zoster vaccine. One dose is recommended for adults aged 60 years or older unless certain conditions are  present.  Measles, mumps, and rubella (MMR) vaccine. Adults born before 1957 generally are considered immune to measles and mumps. Adults born in 1957 or later should have 1 or more doses of MMR vaccine unless there is a contraindication to the vaccine or there is laboratory evidence of immunity to   each of the three diseases. A routine second dose of MMR vaccine should be obtained at least 28 days after the first dose for students attending postsecondary schools, health care workers, or international travelers. People who received inactivated measles vaccine or an unknown type of measles vaccine during 1963-1967 should receive 2 doses of MMR vaccine. People who received inactivated mumps vaccine or an unknown type of mumps vaccine before 1979 and are at high risk for mumps infection should consider immunization with 2 doses of MMR vaccine. For females of childbearing age, rubella immunity should be determined. If there is no evidence of immunity, females who are not pregnant should be vaccinated. If there is no evidence of immunity, females who are pregnant should delay immunization until after pregnancy. Unvaccinated health care workers born before 1957 who lack laboratory evidence of measles, mumps, or rubella immunity or laboratory confirmation of disease should consider measles and mumps immunization with 2 doses of MMR vaccine or rubella immunization with 1 dose of MMR vaccine.  Pneumococcal 13-valent conjugate (PCV13) vaccine. When indicated, a person who is uncertain of her immunization history and has no record of immunization should receive the PCV13 vaccine. An adult aged 19 years or older who has certain medical conditions and has not been previously immunized should receive 1 dose of PCV13 vaccine. This PCV13 should be followed with a dose of pneumococcal polysaccharide (PPSV23) vaccine. The PPSV23 vaccine dose should be obtained at least 8 weeks after the dose of PCV13 vaccine. An adult aged 19  years or older who has certain medical conditions and previously received 1 or more doses of PPSV23 vaccine should receive 1 dose of PCV13. The PCV13 vaccine dose should be obtained 1 or more years after the last PPSV23 vaccine dose.  Pneumococcal polysaccharide (PPSV23) vaccine. When PCV13 is also indicated, PCV13 should be obtained first. All adults aged 65 years and older should be immunized. An adult younger than age 65 years who has certain medical conditions should be immunized. Any person who resides in a nursing home or long-term care facility should be immunized. An adult smoker should be immunized. People with an immunocompromised condition and certain other conditions should receive both PCV13 and PPSV23 vaccines. People with human immunodeficiency virus (HIV) infection should be immunized as soon as possible after diagnosis. Immunization during chemotherapy or radiation therapy should be avoided. Routine use of PPSV23 vaccine is not recommended for American Indians, [place] Natives, or people younger than 65 years unless there are medical conditions that require PPSV23 vaccine. When indicated, people who have unknown immunization and have no record of immunization should receive PPSV23 vaccine. One-time revaccination 5 years after the first dose of PPSV23 is recommended for people aged 19-64 years who have chronic kidney failure, nephrotic syndrome, asplenia, or immunocompromised conditions. People who received 1-2 doses of PPSV23 before age 65 years should receive another dose of PPSV23 vaccine at age 65 years or later if at least 5 years have passed since the previous dose. Doses of PPSV23 are not needed for people immunized with PPSV23 at or after age 65 years.  Meningococcal vaccine. Adults with asplenia or persistent complement component deficiencies should receive 2 doses of quadrivalent meningococcal conjugate (MenACWY-D) vaccine. The doses should be obtained at least 2 months apart.  Microbiologists working with certain meningococcal bacteria, military recruits, people at risk during an outbreak, and people who travel to or live in countries with a high rate of meningitis should be immunized. A first-year college student up through age   21 years who is living in a residence hall should receive a dose if she did not receive a dose on or after her 16th birthday. Adults who have certain high-risk conditions should receive one or more doses of vaccine.  Hepatitis A vaccine. Adults who wish to be protected from this disease, have certain high-risk conditions, work with hepatitis A-infected animals, work in hepatitis A research labs, or travel to or work in countries with a high rate of hepatitis A should be immunized. Adults who were previously unvaccinated and who anticipate close contact with an international adoptee during the first 60 days after arrival in the [place] from a country with a high rate of hepatitis A should be immunized.  Hepatitis B vaccine. Adults who wish to be protected from this disease, have certain high-risk conditions, may be exposed to blood or other infectious body fluids, are household contacts or sex partners of hepatitis B positive people, are clients or workers in certain care facilities, or travel to or work in countries with a high rate of hepatitis B should be immunized.  Haemophilus influenzae type b (Hib) vaccine. A previously unvaccinated person with asplenia or sickle cell disease or having a scheduled splenectomy should receive 1 dose of Hib vaccine. Regardless of previous immunization, a recipient of a hematopoietic stem cell transplant should receive a 3-dose series 6-12 months after her successful transplant. Hib vaccine is not recommended for adults with HIV infection. Preventive Services / Frequency Ages 64 to 68 years  Blood pressure check.** / Every 1 to 2 years.  Lipid and cholesterol check.** / Every 5 years beginning at age  22.  Clinical breast exam.** / Every 3 years for women in their 88s and 53s.  BRCA-related cancer risk assessment.** / For women who have family members with a BRCA-related cancer (breast, ovarian, tubal, or peritoneal cancers).  Pap test.** / Every 2 years from ages 90 through 51. Every 3 years starting at age 21 through age 56 or 3 with a history of 3 consecutive normal Pap tests.  HPV screening.** / Every 3 years from ages 24 through ages 1 to 46 with a history of 3 consecutive normal Pap tests.  Hepatitis C blood test.** / For any individual with known risks for hepatitis C.  Skin self-exam. / Monthly.  Influenza vaccine. / Every year.  Tetanus, diphtheria, and acellular pertussis (Tdap, Td) vaccine.** / Consult your health care provider. Pregnant women should receive 1 dose of Tdap vaccine during each pregnancy. 1 dose of Td every 10 years.  Varicella vaccine.** / Consult your health care provider. Pregnant females who do not have evidence of immunity should receive the first dose after pregnancy.  HPV vaccine. / 3 doses over 6 months, if 72 and younger. The vaccine is not recommended for use in pregnant females. However, pregnancy testing is not needed before receiving a dose.  Measles, mumps, rubella (MMR) vaccine.** / You need at least 1 dose of MMR if you were born in 1957 or later. You may also need a 2nd dose. For females of childbearing age, rubella immunity should be determined. If there is no evidence of immunity, females who are not pregnant should be vaccinated. If there is no evidence of immunity, females who are pregnant should delay immunization until after pregnancy.  Pneumococcal 13-valent conjugate (PCV13) vaccine.** / Consult your health care provider.  Pneumococcal polysaccharide (PPSV23) vaccine.** / 1 to 2 doses if you smoke cigarettes or if you have certain conditions.  Meningococcal vaccine.** /  1 dose if you are age 19 to 21 years and a first-year college  student living in a residence hall, or have one of several medical conditions, you need to get vaccinated against meningococcal disease. You may also need additional booster doses.  Hepatitis A vaccine.** / Consult your health care provider.  Hepatitis B vaccine.** / Consult your health care provider.  Haemophilus influenzae type b (Hib) vaccine.** / Consult your health care provider. Ages 40 to 64 years  Blood pressure check.** / Every 1 to 2 years.  Lipid and cholesterol check.** / Every 5 years beginning at age 20 years.  Lung cancer screening. / Every year if you are aged 55-80 years and have a 30-pack-year history of smoking and currently smoke or have quit within the past 15 years. Yearly screening is stopped once you have quit smoking for at least 15 years or develop a health problem that would prevent you from having lung cancer treatment.  Clinical breast exam.** / Every year after age 40 years.  BRCA-related cancer risk assessment.** / For women who have family members with a BRCA-related cancer (breast, ovarian, tubal, or peritoneal cancers).  Mammogram.** / Every year beginning at age 40 years and continuing for as long as you are in good health. Consult with your health care provider.  Pap test.** / Every 3 years starting at age 30 years through age 65 or 70 years with a history of 3 consecutive normal Pap tests.  HPV screening.** / Every 3 years from ages 30 years through ages 65 to 70 years with a history of 3 consecutive normal Pap tests.  Fecal occult blood test (FOBT) of stool. / Every year beginning at age 50 years and continuing until age 75 years. You may not need to do this test if you get a colonoscopy every 10 years.  Flexible sigmoidoscopy or colonoscopy.** / Every 5 years for a flexible sigmoidoscopy or every 10 years for a colonoscopy beginning at age 50 years and continuing until age 75 years.  Hepatitis C blood test.** / For all people born from 1945 through  1965 and any individual with known risks for hepatitis C.  Skin self-exam. / Monthly.  Influenza vaccine. / Every year.  Tetanus, diphtheria, and acellular pertussis (Tdap/Td) vaccine.** / Consult your health care provider. Pregnant women should receive 1 dose of Tdap vaccine during each pregnancy. 1 dose of Td every 10 years.  Varicella vaccine.** / Consult your health care provider. Pregnant females who do not have evidence of immunity should receive the first dose after pregnancy.  Zoster vaccine.** / 1 dose for adults aged 60 years or older.  Measles, mumps, rubella (MMR) vaccine.** / You need at least 1 dose of MMR if you were born in 1957 or later. You may also need a 2nd dose. For females of childbearing age, rubella immunity should be determined. If there is no evidence of immunity, females who are not pregnant should be vaccinated. If there is no evidence of immunity, females who are pregnant should delay immunization until after pregnancy.  Pneumococcal 13-valent conjugate (PCV13) vaccine.** / Consult your health care provider.  Pneumococcal polysaccharide (PPSV23) vaccine.** / 1 to 2 doses if you smoke cigarettes or if you have certain conditions.  Meningococcal vaccine.** / Consult your health care provider.  Hepatitis A vaccine.** / Consult your health care provider.  Hepatitis B vaccine.** / Consult your health care provider.  Haemophilus influenzae type b (Hib) vaccine.** / Consult your health care provider. Ages 65   years and over  Blood pressure check.** / Every 1 to 2 years.  Lipid and cholesterol check.** / Every 5 years beginning at age 22 years.  Lung cancer screening. / Every year if you are aged 73-80 years and have a 30-pack-year history of smoking and currently smoke or have quit within the past 15 years. Yearly screening is stopped once you have quit smoking for at least 15 years or develop a health problem that would prevent you from having lung cancer  treatment.  Clinical breast exam.** / Every year after age 4 years.  BRCA-related cancer risk assessment.** / For women who have family members with a BRCA-related cancer (breast, ovarian, tubal, or peritoneal cancers).  Mammogram.** / Every year beginning at age 40 years and continuing for as long as you are in good health. Consult with your health care provider.  Pap test.** / Every 3 years starting at age 9 years through age 34 or 91 years with 3 consecutive normal Pap tests. Testing can be stopped between 65 and 70 years with 3 consecutive normal Pap tests and no abnormal Pap or HPV tests in the past 10 years.  HPV screening.** / Every 3 years from ages 57 years through ages 64 or 45 years with a history of 3 consecutive normal Pap tests. Testing can be stopped between 65 and 70 years with 3 consecutive normal Pap tests and no abnormal Pap or HPV tests in the past 10 years.  Fecal occult blood test (FOBT) of stool. / Every year beginning at age 15 years and continuing until age 17 years. You may not need to do this test if you get a colonoscopy every 10 years.  Flexible sigmoidoscopy or colonoscopy.** / Every 5 years for a flexible sigmoidoscopy or every 10 years for a colonoscopy beginning at age 86 years and continuing until age 71 years.  Hepatitis C blood test.** / For all people born from [birth year] through 1966 and any individual with known risks for hepatitis C.  Osteoporosis screening.** / A one-time screening for women ages 83 years and over and women at risk for fractures or osteoporosis.  Skin self-exam. / Monthly.  Influenza vaccine. / Every year.  Tetanus, diphtheria, and acellular pertussis (Tdap/Td) vaccine.** / 1 dose of Td every 10 years.  Varicella vaccine.** / Consult your health care provider.  Zoster vaccine.** / 1 dose for adults aged 61 years or older.  Pneumococcal 13-valent conjugate (PCV13) vaccine.** / Consult your health care provider.  Pneumococcal  polysaccharide (PPSV23) vaccine.** / 1 dose for all adults aged 28 years and older.  Meningococcal vaccine.** / Consult your health care provider.  Hepatitis A vaccine.** / Consult your health care provider.  Hepatitis B vaccine.** / Consult your health care provider.  Haemophilus influenzae type b (Hib) vaccine.** / Consult your health care provider. ** Family history and personal history of risk and conditions may change your health care provider's recommendations. Document Released: 04/29/2001 Document Revised: 07/18/2013 Document Reviewed: 07/29/2010 [facility] Patient Information 2015 [place], [place]. This information is not intended to replace advice given to you by your health care provider. Make sure you discuss any questions you have with your health care provider.

## 2014-03-21 ENCOUNTER — Encounter: Payer: Self-pay | Admitting: Family Medicine

## 2014-03-21 LAB — LDL CHOLESTEROL, DIRECT: Direct LDL: 107.1 mg/dL

## 2014-03-22 MED ORDER — FENOFIBRATE 160 MG PO TABS: 160.0000 mg | ORAL_TABLET | Freq: Every day | ORAL | Status: DC

## 2014-03-29 ENCOUNTER — Telehealth: Payer: Self-pay

## 2014-03-29 NOTE — Telephone Encounter (Signed)
Patient advised Dr Laury AxonLowne at last visit that she had received a bill from toxicology. She would like for her account to be credited. Spoke with toxicology who states that her insurance should have mailed her a check for her to pay the bill with.   LM for patient to return the call.

## 2014-04-06 ENCOUNTER — Other Ambulatory Visit: Payer: Self-pay | Admitting: Family Medicine

## 2014-04-06 MED ORDER — ALPRAZOLAM 0.5 MG PO TABS: ORAL_TABLET | ORAL | Status: DC

## 2014-04-06 NOTE — Telephone Encounter (Signed)
Pt due for follow up 09/2014.  Rx printed and forwarded to Provider for signature.   Medication name:  Name from pharmacy:  ALPRAZolam (XANAX) 0.5 MG tablet ALPRAZOLAM 0.5MG  TAB     Sig: TAKE 1 TABLET BY MOUTH 3 TIMES DAILY FORSLEEP OR ANXIETY    Dispense: 90 tablet (Pharmacy requested 90 each)   Start: 04/06/2014   Class: Normal    Requested on: 01/25/2014    Originally ordered on: 07/08/2010 02/28/2014

## 2014-04-14 ENCOUNTER — Ambulatory Visit (INDEPENDENT_AMBULATORY_CARE_PROVIDER_SITE_OTHER): Payer: BC Managed Care – PPO | Admitting: Physician Assistant

## 2014-04-14 ENCOUNTER — Encounter: Payer: Self-pay | Admitting: Physician Assistant

## 2014-04-14 ENCOUNTER — Other Ambulatory Visit: Payer: Self-pay | Admitting: Family Medicine

## 2014-04-14 VITALS — BP 108/72 | HR 79 | Temp 98.3°F | Resp 16 | Ht 61.0 in | Wt 197.5 lb

## 2014-04-14 DIAGNOSIS — B9689 Other specified bacterial agents as the cause of diseases classified elsewhere: Secondary | ICD-10-CM | POA: Insufficient documentation

## 2014-04-14 DIAGNOSIS — J019 Acute sinusitis, unspecified: Secondary | ICD-10-CM

## 2014-04-14 MED ORDER — LEVOFLOXACIN 750 MG PO TABS: 750.0000 mg | ORAL_TABLET | Freq: Every day | ORAL | Status: DC

## 2014-04-14 MED ORDER — HYDROCOD POLST-CHLORPHEN POLST 10-8 MG/5ML PO LQCR: 5.0000 mL | Freq: Two times a day (BID) | ORAL | Status: DC | PRN

## 2014-04-14 NOTE — Patient Instructions (Signed)
Please take antibiotic as directed.  Increase fluid intake.  Use Saline nasal spray.  Take a daily multivitamin. Continue asthma medications.  Place a humidifier in the bedroom.  Please call or return clinic if symptoms are not improving.  Sinusitis Sinusitis is redness, soreness, and swelling (inflammation) of the paranasal sinuses. Paranasal sinuses are air pockets within the bones of your face (beneath the eyes, the middle of the forehead, or above the eyes). In healthy paranasal sinuses, mucus is able to drain out, and air is able to circulate through them by way of your nose. However, when your paranasal sinuses are inflamed, mucus and air can become trapped. This can allow bacteria and other germs to grow and cause infection. Sinusitis can develop quickly and last only a short time (acute) or continue over a long period (chronic). Sinusitis that lasts for more than 12 weeks is considered chronic.  CAUSES  Causes of sinusitis include:  Allergies.  Structural abnormalities, such as displacement of the cartilage that separates your nostrils (deviated septum), which can decrease the air flow through your nose and sinuses and affect sinus drainage.  Functional abnormalities, such as when the small hairs (cilia) that line your sinuses and help remove mucus do not work properly or are not present. SYMPTOMS  Symptoms of acute and chronic sinusitis are the same. The primary symptoms are pain and pressure around the affected sinuses. Other symptoms include:  Upper toothache.  Earache.  Headache.  Bad breath.  Decreased sense of smell and taste.  A cough, which worsens when you are lying flat.  Fatigue.  Fever.  Thick drainage from your nose, which often is green and may contain pus (purulent).  Swelling and warmth over the affected sinuses. DIAGNOSIS  Your caregiver will perform a physical exam. During the exam, your caregiver may:  Look in your nose for signs of abnormal growths in  your nostrils (nasal polyps).  Tap over the affected sinus to check for signs of infection.  View the inside of your sinuses (endoscopy) with a special imaging device with a light attached (endoscope), which is inserted into your sinuses. If your caregiver suspects that you have chronic sinusitis, one or more of the following tests may be recommended:  Allergy tests.  Nasal culture A sample of mucus is taken from your nose and sent to a lab and screened for bacteria.  Nasal cytology A sample of mucus is taken from your nose and examined by your caregiver to determine if your sinusitis is related to an allergy. TREATMENT  Most cases of acute sinusitis are related to a viral infection and will resolve on their own within 10 days. Sometimes medicines are prescribed to help relieve symptoms (pain medicine, decongestants, nasal steroid sprays, or saline sprays).  However, for sinusitis related to a bacterial infection, your caregiver will prescribe antibiotic medicines. These are medicines that will help kill the bacteria causing the infection.  Rarely, sinusitis is caused by a fungal infection. In theses cases, your caregiver will prescribe antifungal medicine. For some cases of chronic sinusitis, surgery is needed. Generally, these are cases in which sinusitis recurs more than 3 times per year, despite other treatments. HOME CARE INSTRUCTIONS   Drink plenty of water. Water helps thin the mucus so your sinuses can drain more easily.  Use a humidifier.  Inhale steam 3 to 4 times a day (for example, sit in the bathroom with the shower running).  Apply a warm, moist washcloth to your face 3 to 4  times a day, or as directed by your caregiver.  Use saline nasal sprays to help moisten and clean your sinuses.  Take over-the-counter or prescription medicines for pain, discomfort, or fever only as directed by your caregiver. SEEK IMMEDIATE MEDICAL CARE IF:  You have increasing pain or severe  headaches.  You have nausea, vomiting, or drowsiness.  You have swelling around your face.  You have vision problems.  You have a stiff neck.  You have difficulty breathing. MAKE SURE YOU:   Understand these instructions.  Will watch your condition.  Will get help right away if you are not doing well or get worse. Document Released: 03/03/2005 Document Revised: 05/26/2011 Document Reviewed: 03/18/2011 [employer] Patient Information 2014 [employer].   

## 2014-04-14 NOTE — Assessment & Plan Note (Signed)
Rx  Levaquin. Increase fluids.  Rest.  Saline nasal spray.  Probiotic.  Mucinex as directed.  Humidifier in bedroom. Tussionex per orders.  Call or return to clinic if symptoms are not improving.

## 2014-04-14 NOTE — Progress Notes (Signed)
History of Present Illness: Deborah Watts is a 49 y.o. female who present to the clinic today complaining of sinus pressure, sinus pain and nasal congestion x 5 days, worsening tremendously over the past 2-3 days. Patient endorses chills, legs cramps, nausea and fatigue.  Patient denies fever, recent travel or sick contact.  Patient with a history of asthma, and has been taking her albuterol as directed.  History: Past Medical History  Diagnosis Date  . Asthma   . Anxiety   . MHA (microangiopathic hemolytic anemia)   . Hypertension     Current outpatient prescriptions:  .  ALPRAZolam (XANAX) 0.5 MG tablet, TAKE 1 TABLET BY MOUTH 3 TIMES DAILY FORSLEEP OR ANXIETY, Disp: 90 tablet, Rfl: 1 .  celecoxib (CELEBREX) 200 MG capsule, TAKE 1 CAPSULE BY MOUTH TWICE DAILY AS NEEDED, Disp: 60 capsule, Rfl: 2 .  fenofibrate 160 MG tablet, Take 1 tablet (160 mg total) by mouth daily., Disp: 30 tablet, Rfl: 2 .  lisinopril-hydrochlorothiazide (PRINZIDE,ZESTORETIC) 10-12.5 MG per tablet, TAKE ONE TABLET BY MOUTH ONCE DAILY, Disp: 90 tablet, Rfl: 1 .  loratadine (CLARITIN) 10 MG tablet, Take 10 mg by mouth daily., Disp: , Rfl:  .  Meclizine HCl 25 MG CHEW, Chew 1 tablet (25 mg total) by mouth every 6 (six) hours as needed., Disp: 45 each, Rfl: 0 .  mometasone (NASONEX) 50 MCG/ACT nasal spray, 2 sprays each nostril qd, Disp: 17 g, Rfl: 5 .  mometasone-formoterol (DULERA) 200-5 MCG/ACT AERO, Inhale 2 puffs into the lungs 2 (two) times daily., Disp: 1 Inhaler, Rfl: 11 .  montelukast (SINGULAIR) 10 MG tablet, Take 1 tablet (10 mg total) by mouth at bedtime., Disp: 90 tablet, Rfl: 3 .  potassium chloride (KLOR-CON M10) 10 MEQ tablet, TAKE ONE TABLET BY MOUTH TWICE DAILY, Disp: 180 tablet, Rfl: 0 .  PROAIR HFA 108 (90 BASE) MCG/ACT inhaler, INHALE 2 PUFFS INTO THE LUNGS EVERY 6 HOURS AS NEEDED FOR WHEEZING, Disp: 8.5 g, Rfl: 2 .  promethazine (PHENERGAN) 25 MG tablet, Take 1 tablet (25 mg total) by mouth every  6 (six) hours as needed for nausea., Disp: 30 tablet, Rfl: 0 .  SUMAtriptan (IMITREX) 100 MG tablet, TAKE 1 TABLET BY MOUTH AS NEEDED FOR MIGRAINE AND REPEAT IN 2 HOURS AS NEEDED, Disp: 12 tablet, Rfl: 0 .  topiramate (TOPAMAX) 50 MG tablet, Take 1 tablet (50 mg total) by mouth 2 (two) times daily., Disp: 180 tablet, Rfl: 3 .  chlorpheniramine-HYDROcodone (TUSSIONEX PENNKINETIC ER) 10-8 MG/5ML LQCR, Take 5 mLs by mouth every 12 (twelve) hours as needed., Disp: 115 mL, Rfl: 0 .  levofloxacin (LEVAQUIN) 750 MG tablet, Take 1 tablet (750 mg total) by mouth daily., Disp: 7 tablet, Rfl: 0 Allergies  Allergen Reactions  . Prednisone     Elevated BP; numbness to face  . Iohexol      Desc: hives, sob, pt. needs 13 hr prep   01/16/05   . Ivp Dye [Iodinated Diagnostic Agents] Hives and Swelling  . Penicillins    Family History  Problem Relation Age of Onset  . Asthma    . Diabetes    . Hyperlipidemia    . Throat cancer Father    History   Social History  . Marital Status: Married    Spouse Name: N/A    Number of Children: N/A  . Years of Education: N/A   Occupational History  . [employer]    Social History Main Topics  . Smoking status: Never Smoker   .  Smokeless tobacco: Never Used  . Alcohol Use: 0.0 oz/week     Comment: occassional  . Drug Use: No  . Sexual Activity:    Partners: Male   Other Topics Concern  . None   Social History Narrative   Exercise-- no   Review of Systems: See HPI.  All other ROS are negative.  Physical Examination: BP 108/72 mmHg  Pulse 79  Temp(Src) 98.3 F (36.8 C) (Oral)  Resp 16  Ht  (1.549 m)  Wt 197 lb 8 oz (89.585 kg)  BMI 37.34 kg/m2  SpO2 98%  General appearance: alert, cooperative and appears stated age Head: Normocephalic, without obvious abnormality, atraumatic, sinuses tender to percussion Eyes: conjunctivae/corneas clear. PERRL, EOM's intact. Fundi benign. Ears: normal TM's and external ear canals both ears Nose:  moderate congestion, turbinates swollen, sinus tenderness bilateral Throat: lips, mucosa, and tongue normal; teeth and gums normal Neck: no adenopathy, no carotid bruit, no JVD, supple, symmetrical, trachea midline and thyroid not enlarged, symmetric, no tenderness/mass/nodules Lungs: clear to auscultation bilaterally Chest Inge: no tenderness  Assessment/Plan: Acute bacterial sinusitis Rx  Levaquin. Increase fluids.  Rest.  Saline nasal spray.  Probiotic.  Mucinex as directed.  Humidifier in bedroom. Tussionex per orders.  Call or return to clinic if symptoms are not improving.

## 2014-04-20 ENCOUNTER — Ambulatory Visit (HOSPITAL_BASED_OUTPATIENT_CLINIC_OR_DEPARTMENT_OTHER)
Admission: RE | Admit: 2014-04-20 | Discharge: 2014-04-20 | Disposition: A | Discharge status: Home / Self Care | Payer: BLUE CROSS/BLUE SHIELD | Source: Ambulatory Visit | Attending: Family Medicine | Admitting: Family Medicine

## 2014-04-20 ENCOUNTER — Other Ambulatory Visit: Payer: Self-pay | Admitting: Family Medicine

## 2014-04-20 ENCOUNTER — Ambulatory Visit (INDEPENDENT_AMBULATORY_CARE_PROVIDER_SITE_OTHER): Payer: BLUE CROSS/BLUE SHIELD | Admitting: Family Medicine

## 2014-04-20 ENCOUNTER — Encounter: Payer: Self-pay | Admitting: Family Medicine

## 2014-04-20 VITALS — BP 110/74 | HR 78 | Temp 98.2°F | Wt 196.0 lb

## 2014-04-20 DIAGNOSIS — M79662 Pain in left lower leg: Secondary | ICD-10-CM

## 2014-04-20 DIAGNOSIS — M79604 Pain in right leg: Secondary | ICD-10-CM

## 2014-04-20 DIAGNOSIS — M79661 Pain in right lower leg: Secondary | ICD-10-CM | POA: Insufficient documentation

## 2014-04-20 NOTE — Assessment & Plan Note (Signed)
Doppler R leg r/o clot---- neg  May be from statin-- stop statin Call office iin 2 weeks and let us know if pain subsides Warm compresses Refer to sport med

## 2014-04-20 NOTE — Progress Notes (Signed)
Pre visit review using our clinic review tool, if applicable. No additional management support is needed unless otherwise documented below in the visit note. 

## 2014-04-20 NOTE — Patient Instructions (Signed)
Hold cholesterol med for 2 weeks--- call and let us know if pain has subsided or not We will check US leg to rule out a clot

## 2014-04-20 NOTE — Progress Notes (Signed)
Subjective:    Patient ID: Deborah Watts, female    DOB: March 15, 1966, 49 y.o.   MRN: [id number]  HPI  Patient here c/o pain R leg/ calf x several days.  No known injury.  No sob, no cp.    Past Medical History  Diagnosis Date  . Asthma   . Anxiety   . MHA (microangiopathic hemolytic anemia)   . Hypertension     Review of Systems  Constitutional: Positive for fatigue. Negative for activity change, appetite change and unexpected weight change.  Respiratory: Negative for cough, shortness of breath and wheezing.   Cardiovascular: Negative for chest pain, palpitations and leg swelling.  Musculoskeletal: Negative for myalgias, joint swelling, arthralgias and gait problem.  Psychiatric/Behavioral: Negative for behavioral problems and dysphoric mood. The patient is not nervous/anxious.        Objective:    Physical Exam  Constitutional: She is oriented to person, place, and time. She appears well-developed and well-nourished. No distress.  HENT:  Right Ear: External ear normal.  Left Ear: External ear normal.  Nose: Nose normal.  Mouth/Throat: Oropharynx is clear and moist.  Eyes: EOM are normal. Pupils are equal, round, and reactive to light.  Neck: Normal range of motion. Neck supple.  Cardiovascular: Normal rate, regular rhythm and normal heart sounds.   No murmur heard. Pulmonary/Chest: Effort normal and breath sounds normal. No respiratory distress. She has no wheezes. She has no rales. She exhibits no tenderness.  Musculoskeletal: She exhibits tenderness. She exhibits no edema.       Right knee: She exhibits no swelling, no effusion, no ecchymosis and no erythema. No tenderness found.       Left knee: She exhibits deformity. She exhibits normal range of motion, no swelling, no effusion, no ecchymosis and no erythema. Tenderness found.       Left ankle: She exhibits no swelling, no ecchymosis, no deformity and no laceration. Tenderness.       Right upper leg: She exhibits  tenderness. She exhibits no swelling, no edema and no deformity.       Right lower leg: She exhibits tenderness. She exhibits no swelling and no edema.  Neurological: She is alert and oriented to person, place, and time.  Skin: No rash noted. No erythema. No pallor.  Psychiatric: She has a normal mood and affect. Her behavior is normal. Judgment and thought content normal.    BP 110/74 mmHg  Pulse 78  Temp(Src) 98.2 F (36.8 C) (Oral)  Wt 196 lb (88.905 kg)  SpO2 96% Wt Readings from Last 3 Encounters:  04/20/14 196 lb (88.905 kg)  04/14/14 197 lb 8 oz (89.585 kg)  03/20/14 208 lb (94.348 kg)     Lab Results  Component Value Date   WBC 11.4* 03/20/2014   HGB 14.4 03/20/2014   HCT 44.0 03/20/2014   PLT 385.0 03/20/2014   GLUCOSE 90 03/20/2014   CHOL 178 03/20/2014   TRIG 227.0* 03/20/2014   HDL 34.10* 03/20/2014   LDLDIRECT 107.1 03/20/2014   LDLCALC 88 06/23/2013   ALT 21 03/20/2014   AST 18 03/20/2014   NA 140 03/20/2014   K 3.9 03/20/2014   CL 102 03/20/2014   CREATININE 0.8 03/20/2014   BUN 11 03/20/2014   CO2 30 03/20/2014   TSH 1.34 03/20/2014    [place] Venous Img Lower Unilateral Left  04/20/2014   CLINICAL DATA:  Left calf pain for 1 week, worse over the last 2 days.  EXAM: LEFT LOWER EXTREMITY  VENOUS DOPPLER ULTRASOUND  TECHNIQUE: Gray-scale sonography with graded compression, as well as color Doppler and duplex ultrasound were performed to evaluate the lower extremity deep venous systems from the level of the common femoral vein and including the common femoral, femoral, profunda femoral, popliteal and calf veins including the posterior tibial, peroneal and gastrocnemius veins when visible. The superficial great saphenous vein was also interrogated. Spectral Doppler was utilized to evaluate flow at rest and with distal augmentation maneuvers in the common femoral, femoral and popliteal veins.  COMPARISON:  None.  FINDINGS: Contralateral Common Femoral Vein: Respiratory  phasicity is normal and symmetric with the symptomatic side. No evidence of thrombus. Normal compressibility.  Common Femoral Vein: No evidence of thrombus. Normal compressibility, respiratory phasicity and response to augmentation.  Saphenofemoral Junction: No evidence of thrombus. Normal compressibility and flow on color Doppler imaging.  Profunda Femoral Vein: No evidence of thrombus. Normal compressibility and flow on color Doppler imaging.  Femoral Vein: No evidence of thrombus. Normal compressibility, respiratory phasicity and response to augmentation.  Popliteal Vein: No evidence of thrombus. Normal compressibility, respiratory phasicity and response to augmentation.  Calf Veins: No evidence of thrombus. Normal compressibility and flow on color Doppler imaging.  Superficial Great Saphenous Vein: No evidence of thrombus. Normal compressibility and flow on color Doppler imaging.  Venous Reflux:  None.  Other Findings:  None.  IMPRESSION: No evidence of deep venous thrombosis.   Electronically Signed   By: Sebastian Ache   On: 04/20/2014 17:31       Assessment & Plan:   Problem List Items Addressed This Visit    Right calf pain    Doppler R leg r/o clot---- neg  May be from statin-- stop statin Call office iin 2 weeks and let us know if pain subsides Warm compresses Refer to sport med        Other Visit Diagnoses    Calf pain, left    -  Primary        Loreen Freud, DO

## 2014-04-21 ENCOUNTER — Ambulatory Visit: Payer: BC Managed Care – PPO | Admitting: Family Medicine

## 2014-04-25 ENCOUNTER — Telehealth: Payer: Self-pay | Admitting: Family Medicine

## 2014-04-25 MED ORDER — ALBUTEROL SULFATE 108 (90 BASE) MCG/ACT IN AEPB: 2.0000 | INHALATION_SPRAY | Freq: Four times a day (QID) | RESPIRATORY_TRACT | Status: DC | PRN

## 2014-04-25 NOTE — Telephone Encounter (Signed)
Caller name: candace from [facility] Relation to pt: Call back number: [phone] Pharmacy:  Reason for call:   Being getting the regular pro air but has a bad clogging problem. Would like to know if Dr. Laury AxonLowne would ok prescribing proair respi click.

## 2014-04-25 NOTE — Telephone Encounter (Signed)
Rx faxed.    KP 

## 2014-04-25 NOTE — Telephone Encounter (Signed)
Ok to change to respiclick--- same directions

## 2014-04-25 NOTE — Telephone Encounter (Signed)
Please advise      KP 

## 2014-04-26 ENCOUNTER — Ambulatory Visit (INDEPENDENT_AMBULATORY_CARE_PROVIDER_SITE_OTHER): Payer: BLUE CROSS/BLUE SHIELD | Admitting: Family Medicine

## 2014-04-26 ENCOUNTER — Encounter: Payer: Self-pay | Admitting: Family Medicine

## 2014-04-26 VITALS — BP 111/77 | HR 71 | Ht 59.0 in | Wt 195.0 lb

## 2014-04-26 DIAGNOSIS — M79605 Pain in left leg: Secondary | ICD-10-CM

## 2014-04-26 NOTE — Patient Instructions (Signed)
You have a calf strain. Compression sleeve or ace wrap to help with swelling and pain. Icing or heat for 15 minutes at a time 3-4 times a day (whichever feels better) Heel lifts either in temporary orthotics or on their own to prevent further strain. Continue your celebrex. Do theraband strengthening exercises once a day - work way up to doing 3 sets of 10 once a day. When you're comfortable with this you can do calf raises. Follow up with me in 1 month. Consider physical therapy if you're struggling.

## 2014-04-28 ENCOUNTER — Encounter: Payer: Self-pay | Admitting: Family Medicine

## 2014-04-28 DIAGNOSIS — M79605 Pain in left leg: Secondary | ICD-10-CM | POA: Insufficient documentation

## 2014-04-28 NOTE — Assessment & Plan Note (Signed)
Doppler negative for DVT.  consistent with overuse calf strain.  Compression sleeve, ice or heat, heel lifts.  Continue celebrex.  Add strengthening exercises first with the theraband then work way up to doing double leg calf raises and finally single leg.  Consider physical therapy if not improving.  F/u in 1 month.

## 2014-04-28 NOTE — Progress Notes (Signed)
PCP and referred by: Loreen FreudYvonne Lowne, DO  Subjective:   HPI: Patient is a 49 y.o. female here for left leg pain.  Patient denies known injury. She reports over past 1 1/2 weeks she's had progressively worsening posterior left knee and calf pain. Questionable swelling. No bruising. Has been taking tylenol and soaking in hot water. Saw her PCP Dr. Laury AxonLowne and had a doppler u/s negative for DVT. Pain worse with weight bearing.  Past Medical History  Diagnosis Date  . Asthma   . Anxiety   . MHA (microangiopathic hemolytic anemia)   . Hypertension     Current Outpatient Prescriptions on File Prior to Visit  Medication Sig Dispense Refill  . Albuterol Sulfate (PROAIR RESPICLICK) 108 (90 BASE) MCG/ACT AEPB Inhale 2 puffs into the lungs every 6 (six) hours as needed. 1 each 2  . ALPRAZolam (XANAX) 0.5 MG tablet TAKE 1 TABLET BY MOUTH 3 TIMES DAILY FORSLEEP OR ANXIETY 90 tablet 1  . fenofibrate 160 MG tablet Take 1 tablet (160 mg total) by mouth daily. 30 tablet 2  . lisinopril-hydrochlorothiazide (PRINZIDE,ZESTORETIC) 10-12.5 MG per tablet TAKE ONE TABLET BY MOUTH ONCE DAILY 90 tablet 1  . montelukast (SINGULAIR) 10 MG tablet Take 1 tablet (10 mg total) by mouth at bedtime. 90 tablet 3  . SUMAtriptan (IMITREX) 100 MG tablet TAKE 1 TABLET BY MOUTH AS NEEDED FOR MIGRAINE AND REPEAT IN 2 HOURS AS NEEDED 12 tablet 0  . topiramate (TOPAMAX) 50 MG tablet Take 1 tablet (50 mg total) by mouth 2 (two) times daily. 180 tablet 3  . TRAVEL SICKNESS 25 MG CHEW CHEW 1 TABLET EVERY 6 HOURS AS NEEDED 45 each 0   No current facility-administered medications on file prior to visit.    No past surgical history on file.  Allergies  Allergen Reactions  . Prednisone     Elevated BP; numbness to face  . Iohexol      Desc: hives, sob, pt. needs 13 hr prep   01/16/05   . Ivp Dye [Iodinated Diagnostic Agents] Hives and Swelling  . Penicillins     History   Social History  . Marital Status: Married   Spouse Name: N/A  . Number of Children: N/A  . Years of Education: N/A   Occupational History  . [employer]    Social History Main Topics  . Smoking status: Never Smoker   . Smokeless tobacco: Never Used  . Alcohol Use: 0.0 oz/week    0 Standard drinks or equivalent per week     Comment: occassional  . Drug Use: No  . Sexual Activity:    Partners: Male   Other Topics Concern  . Not on file   Social History Narrative   Exercise-- no    Family History  Problem Relation Age of Onset  . Asthma    . Diabetes    . Hyperlipidemia    . Throat cancer Father     BP 111/77 mmHg  Pulse 71  Ht 4\' 11"  (1.499 m)  Wt 195 lb (88.451 kg)  BMI 39.36 kg/m2  Review of Systems: See HPI above.    Objective:  Physical Exam:  Gen: NAD  Left knee/lower leg: No gross deformity, ecchymoses, swelling, warmth.  No palpable cords. TTP posterior proximal calf.  No joint line, other tenderness. FROM with pain on calf raise, full dorsiflexion and plantarflexion of ankle. Negative ant/post drawers. Negative valgus/varus testing. Negative lachmanns. Negative apleys. NV intact distally.    Assessment & Plan:  1. Left leg pain - Doppler negative for DVT.  consistent with overuse calf strain.  Compression sleeve, ice or heat, heel lifts.  Continue celebrex.  Add strengthening exercises first with the theraband then work way up to doing double leg calf raises and finally single leg.  Consider physical therapy if not improving.  F/u in 1 month.

## 2014-05-18 ENCOUNTER — Encounter: Payer: Self-pay | Admitting: Family Medicine

## 2014-05-18 ENCOUNTER — Other Ambulatory Visit: Payer: Self-pay | Admitting: Family Medicine

## 2014-05-18 MED ORDER — OMEGA-3-ACID ETHYL ESTERS 1 G PO CAPS
2.0000 g | ORAL_CAPSULE | Freq: Two times a day (BID) | ORAL | Status: DC
Start: 2014-05-18 — End: 2014-09-29

## 2014-05-18 NOTE — Telephone Encounter (Signed)
lovaza 1 g   2 po bid  #120   2 refills Give coupon

## 2014-05-18 NOTE — Telephone Encounter (Signed)
lovaza 1 g   2 po bid  #120   2 refills Give coupon 

## 2014-05-18 NOTE — Addendum Note (Signed)
Addended by: Arnette NorrisPAYNE, Lukus Binion P on: 05/18/2014 11:48 AM   Modules accepted: Orders

## 2014-05-19 ENCOUNTER — Other Ambulatory Visit: Payer: Self-pay | Admitting: Family Medicine

## 2014-05-25 ENCOUNTER — Ambulatory Visit: Payer: BLUE CROSS/BLUE SHIELD | Admitting: Family Medicine

## 2014-06-19 ENCOUNTER — Other Ambulatory Visit: Payer: Self-pay | Admitting: Family Medicine

## 2014-06-19 NOTE — Telephone Encounter (Signed)
Last seen 04/20/14 and filled 04/06/14 #90 with 1 refill UDS 03/20/14 Negative for Xanax but marked low risk   Please advise      KP

## 2014-07-13 ENCOUNTER — Other Ambulatory Visit: Payer: Self-pay | Admitting: Family Medicine

## 2014-07-31 ENCOUNTER — Telehealth: Payer: Self-pay | Admitting: Family Medicine

## 2014-07-31 NOTE — Telephone Encounter (Signed)
Caller name: candace from [facility] Relation to pt: Call back number: [phone] Pharmacy:  Reason for call:   Insurance is requiring prior auth on celebrex. [employer]

## 2014-08-02 ENCOUNTER — Encounter: Payer: Self-pay | Admitting: Family Medicine

## 2014-08-02 NOTE — Telephone Encounter (Signed)
PA initiated. Awaiting determination.

## 2014-08-28 ENCOUNTER — Other Ambulatory Visit: Payer: Self-pay | Admitting: Family Medicine

## 2014-08-28 NOTE — Telephone Encounter (Signed)
Last OV 04/21/14 and f/u 09/2014.  UDS 03/20/14. CSC from 2014.  Rx printed and forwarded to PRovider for signature.   Medication name:  Name from pharmacy:  ALPRAZolam (XANAX) 0.5 MG tablet ALPRAZOLAM 0.5MG  TAB    Sig: TAKE 1 TABLET BY MOUTH 3 TIMES DAILY FORSLEEP OR ANXIETY    Dispense: 90 tablet (Pharmacy requested 90 each)   Start: 08/28/2014   Class: Normal    Requested on: 06/20/2014    Originally ordered on: 07/08/2010 07/26/2014

## 2014-09-04 ENCOUNTER — Other Ambulatory Visit: Payer: Self-pay | Admitting: Family Medicine

## 2014-09-19 ENCOUNTER — Ambulatory Visit: Payer: BC Managed Care – PPO | Admitting: Family Medicine

## 2014-09-29 ENCOUNTER — Encounter: Payer: Self-pay | Admitting: Family Medicine

## 2014-09-29 ENCOUNTER — Ambulatory Visit (INDEPENDENT_AMBULATORY_CARE_PROVIDER_SITE_OTHER): Payer: BLUE CROSS/BLUE SHIELD | Admitting: Family Medicine

## 2014-09-29 VITALS — BP 102/74 | HR 73 | Temp 98.3°F | Ht 59.0 in | Wt 177.6 lb

## 2014-09-29 DIAGNOSIS — I10 Essential (primary) hypertension: Secondary | ICD-10-CM

## 2014-09-29 DIAGNOSIS — H6523 Chronic serous otitis media, bilateral: Secondary | ICD-10-CM

## 2014-09-29 LAB — BASIC METABOLIC PANEL
BUN: 15 mg/dL (ref 6–23)
CHLORIDE: 105 meq/L (ref 96–112)
CO2: 26 meq/L (ref 19–32)
Calcium: 9.6 mg/dL (ref 8.4–10.5)
Creatinine, Ser: 0.77 mg/dL (ref 0.40–1.20)
GFR: 84.25 mL/min (ref >60.00)
GLUCOSE: 77 mg/dL (ref 70–99)
POTASSIUM: 3.7 meq/L (ref 3.5–5.1)
Sodium: 141 mEq/L (ref 135–145)

## 2014-09-29 LAB — HEPATIC FUNCTION PANEL
ALK PHOS: 62 U/L (ref 39–117)
ALT: 13 U/L (ref 0–35)
AST: 16 U/L (ref 0–37)
Albumin: 4.2 g/dL (ref 3.5–5.2)
BILIRUBIN TOTAL: 0.3 mg/dL (ref 0.2–1.2)
Bilirubin, Direct: 0.1 mg/dL (ref 0.0–0.3)
Total Protein: 7.3 g/dL (ref 6.0–8.3)

## 2014-09-29 LAB — LIPID PANEL
CHOL/HDL RATIO: 5
Cholesterol: 168 mg/dL (ref 0–200)
HDL: 34.7 mg/dL — ABNORMAL LOW (ref >39.00)
LDL Cholesterol: 105 mg/dL — ABNORMAL HIGH (ref 0–99)
NonHDL: 133.3
Triglycerides: 144 mg/dL (ref 0.0–149.0)
VLDL: 28.8 mg/dL (ref 0.0–40.0)

## 2014-09-29 MED ORDER — LISINOPRIL 10 MG PO TABS
10.0000 mg | ORAL_TABLET | Freq: Every day | ORAL | Status: DC
Start: 2014-09-29 — End: 2014-11-17

## 2014-09-29 MED ORDER — PREDNISONE 10 MG PO TABS: ORAL_TABLET | ORAL | Status: DC

## 2014-09-29 MED ORDER — LISINOPRIL 10 MG PO TABS: 10.0000 mg | ORAL_TABLET | Freq: Every day | ORAL | Status: DC

## 2014-09-29 NOTE — Patient Instructions (Signed)

## 2014-09-29 NOTE — Progress Notes (Signed)
Pre visit review using our clinic review tool, if applicable. No additional management support is needed unless otherwise documented below in the visit note. 

## 2014-09-29 NOTE — Progress Notes (Signed)
Patient ID: Deborah Watts, female    DOB: [birth year]  Age: 49 y.o. MRN: [id number]    Subjective:  Subjective HPI Deborah Watts presents for f/u bp and c/o ear pain b/l x 3 weeks.   Ears feel plugged.  She is using her nasonex and zyrtec with no relief.    Review of Systems  Constitutional: Negative for activity change, appetite change, fatigue and unexpected weight change.  HENT: Positive for ear pain. Negative for congestion, dental problem, ear discharge and hearing loss.   Respiratory: Negative for cough and shortness of breath.   Cardiovascular: Negative for chest pain and palpitations.  Psychiatric/Behavioral: Negative for behavioral problems and dysphoric mood. The patient is not nervous/anxious.     History Past Medical History  Diagnosis Date  . Asthma   . Anxiety   . MHA (microangiopathic hemolytic anemia)   . Hypertension     She has no past surgical history on file.   Her family history includes Asthma in an other family member; Diabetes in an other family member; Hyperlipidemia in an other family member; Throat cancer in her father.She reports that she has never smoked. She has never used smokeless tobacco. She reports that she drinks alcohol. She reports that she does not use illicit drugs.  Current Outpatient Prescriptions on File Prior to Visit  Medication Sig Dispense Refill  . Albuterol Sulfate (PROAIR RESPICLICK) 108 (90 BASE) MCG/ACT AEPB Inhale 2 puffs into the lungs every 6 (six) hours as needed. 1 each 2  . ALPRAZolam (XANAX) 0.5 MG tablet TAKE 1 TABLET BY MOUTH 3 TIMES DAILY FORSLEEP OR ANXIETY 90 tablet 0  . celecoxib (CELEBREX) 200 MG capsule TAKE 1 CAPSULE BY MOUTH TWICE DAILY AS NEEDED 60 capsule 2  . montelukast (SINGULAIR) 10 MG tablet Take 1 tablet (10 mg total) by mouth at bedtime. 90 tablet 3  . SUMAtriptan (IMITREX) 100 MG tablet TAKE ONE TABLET BY MOUTH AS NEEDED FOR MIGRAINE AND REPEAT IN 2 HOURSAS NEEDED 12 tablet 1  . topiramate (TOPAMAX) 50  MG tablet Take 1 tablet (50 mg total) by mouth 2 (two) times daily. 180 tablet 3  . TRAVEL SICKNESS 25 MG CHEW CHEW 1 TABLET EVERY 6 HOURS AS NEEDED 45 each 0   No current facility-administered medications on file prior to visit.     Objective:  Objective Physical Exam  Constitutional: She is oriented to person, place, and time. She appears well-developed and well-nourished.  HENT:  Head: Normocephalic and atraumatic.  Right Ear: Tympanic membrane is bulging. Tympanic membrane is not erythematous. Tympanic membrane mobility is abnormal. A middle ear effusion is present.  Left Ear: Tympanic membrane is bulging. Tympanic membrane is not erythematous. Tympanic membrane mobility is abnormal. A middle ear effusion is present.  Nose: Mucosal edema and rhinorrhea present. No sinus tenderness. Right sinus exhibits no maxillary sinus tenderness and no frontal sinus tenderness. Left sinus exhibits no maxillary sinus tenderness and no frontal sinus tenderness.  Mouth/Throat: No posterior oropharyngeal edema or posterior oropharyngeal erythema.  Eyes: Conjunctivae and EOM are normal.  Neck: Normal range of motion. Neck supple. No JVD present. Carotid bruit is not present. No thyromegaly present.  Cardiovascular: Normal rate, regular rhythm and normal heart sounds.   No murmur heard. Pulmonary/Chest: Effort normal and breath sounds normal. No respiratory distress. She has no wheezes. She has no rales. She exhibits no tenderness.  Musculoskeletal: She exhibits no edema.  Neurological: She is alert and oriented to person, place, and time.  Psychiatric: She has a normal mood and affect.   BP 102/74 mmHg  Pulse 73  Temp(Src) 98.3 F (36.8 C) (Oral)  Ht 4\' 11"  (1.499 m)  Wt 177 lb 9.6 oz (80.559 kg)  BMI 35.85 kg/m2  SpO2 98% Wt Readings from Last 3 Encounters:  09/29/14 177 lb 9.6 oz (80.559 kg)  04/26/14 195 lb (88.451 kg)  04/20/14 196 lb (88.905 kg)     Lab Results  Component Value Date    WBC 11.4* 03/20/2014   HGB 14.4 03/20/2014   HCT 44.0 03/20/2014   PLT 385.0 03/20/2014   GLUCOSE 77 09/29/2014   CHOL 168 09/29/2014   TRIG 144.0 09/29/2014   HDL 34.70* 09/29/2014   LDLDIRECT 107.1 03/20/2014   LDLCALC 105* 09/29/2014   ALT 13 09/29/2014   AST 16 09/29/2014   NA 141 09/29/2014   K 3.7 09/29/2014   CL 105 09/29/2014   CREATININE 0.77 09/29/2014   BUN 15 09/29/2014   CO2 26 09/29/2014   TSH 1.34 03/20/2014    US Venous Img Lower Unilateral Left  04/20/2014   CLINICAL DATA:  Left calf pain for 1 week, worse over the last 2 days.  EXAM: LEFT LOWER EXTREMITY VENOUS DOPPLER ULTRASOUND  TECHNIQUE: Gray-scale sonography with graded compression, as well as color Doppler and duplex ultrasound were performed to evaluate the lower extremity deep venous systems from the level of the common femoral vein and including the common femoral, femoral, profunda femoral, popliteal and calf veins including the posterior tibial, peroneal and gastrocnemius veins when visible. The superficial great saphenous vein was also interrogated. Spectral Doppler was utilized to evaluate flow at rest and with distal augmentation maneuvers in the common femoral, femoral and popliteal veins.  COMPARISON:  None.  FINDINGS: Contralateral Common Femoral Vein: Respiratory phasicity is normal and symmetric with the symptomatic side. No evidence of thrombus. Normal compressibility.  Common Femoral Vein: No evidence of thrombus. Normal compressibility, respiratory phasicity and response to augmentation.  Saphenofemoral Junction: No evidence of thrombus. Normal compressibility and flow on color Doppler imaging.  Profunda Femoral Vein: No evidence of thrombus. Normal compressibility and flow on color Doppler imaging.  Femoral Vein: No evidence of thrombus. Normal compressibility, respiratory phasicity and response to augmentation.  Popliteal Vein: No evidence of thrombus. Normal compressibility, respiratory phasicity and  response to augmentation.  Calf Veins: No evidence of thrombus. Normal compressibility and flow on color Doppler imaging.  Superficial Great Saphenous Vein: No evidence of thrombus. Normal compressibility and flow on color Doppler imaging.  Venous Reflux:  None.  Other Findings:  None.  IMPRESSION: No evidence of deep venous thrombosis.   Electronically Signed   By: Sebastian Ache   On: 04/20/2014 17:31     Assessment & Plan:  Plan I have discontinued Ms. Lainez's lisinopril-hydrochlorothiazide, fenofibrate, and omega-3 acid ethyl esters. I am also having her start on predniSONE. Additionally, I am having her maintain her montelukast, topiramate, Albuterol Sulfate, SUMAtriptan, TRAVEL SICKNESS, ALPRAZolam, celecoxib, and lisinopril.  Meds ordered this encounter  Medications  . DISCONTD: lisinopril (PRINIVIL,ZESTRIL) 10 MG tablet    Sig: Take 1 tablet (10 mg total) by mouth daily.    Dispense:  90 tablet    Refill:  3  . lisinopril (PRINIVIL,ZESTRIL) 10 MG tablet    Sig: Take 1 tablet (10 mg total) by mouth daily.    Dispense:  90 tablet    Refill:  3  . predniSONE (DELTASONE) 10 MG tablet    Sig: 3 po  qd for 3 days then 2 po qd for 3 days the 1 po qd for 3 days    Dispense:  18 tablet    Refill:  0    Problem List Items Addressed This Visit    None    Visit Diagnoses    Essential hypertension    -  Primary    Relevant Medications    lisinopril (PRINIVIL,ZESTRIL) 10 MG tablet    Other Relevant Orders    Basic metabolic panel (Completed)    Hepatic function panel (Completed)    Lipid panel (Completed)    Bilateral chronic serous otitis media        Relevant Medications    predniSONE (DELTASONE) 10 MG tablet       Follow-up: Return in about 3 weeks (around 10/20/2014), or if symptoms worsen or fail to improve, for annual exam, fasting.  Loreen Freud, DO

## 2014-10-04 ENCOUNTER — Other Ambulatory Visit: Payer: Self-pay | Admitting: Family Medicine

## 2014-10-04 NOTE — Telephone Encounter (Signed)
Last seen 09/29/14 and filled 08/28/14 #90 UDS 03/20/14   Please advise      KP

## 2014-10-05 MED ORDER — ALPRAZOLAM 0.5 MG PO TABS: ORAL_TABLET | ORAL | Status: DC

## 2014-10-05 NOTE — Addendum Note (Signed)
Addended by: Arnette Norris on: 10/05/2014 08:24 AM   Modules accepted: Orders

## 2014-11-17 ENCOUNTER — Ambulatory Visit (INDEPENDENT_AMBULATORY_CARE_PROVIDER_SITE_OTHER): Payer: BLUE CROSS/BLUE SHIELD | Admitting: Family Medicine

## 2014-11-17 ENCOUNTER — Encounter: Payer: Self-pay | Admitting: Family Medicine

## 2014-11-17 VITALS — BP 108/62 | HR 66 | Temp 98.2°F | Wt 173.0 lb

## 2014-11-17 DIAGNOSIS — I10 Essential (primary) hypertension: Secondary | ICD-10-CM | POA: Diagnosis not present

## 2014-11-17 NOTE — Assessment & Plan Note (Signed)
Stop lisinopril  Recheck 2-3 weeks

## 2014-11-17 NOTE — Progress Notes (Signed)
Patient ID: Deborah Watts, female    DOB: [birth year]  Age: 49 y.o. MRN: [id number]    Subjective:  Subjective HPI SURINA STORTS presents for bp check.  She has lost 4 more lbs.  No complaints.   Review of Systems  Constitutional: Negative for diaphoresis, appetite change, fatigue and unexpected weight change.  Eyes: Negative for pain, redness and visual disturbance.  Respiratory: Negative for cough, chest tightness, shortness of breath and wheezing.   Cardiovascular: Negative for chest pain, palpitations and leg swelling.  Endocrine: Negative for cold intolerance, heat intolerance, polydipsia, polyphagia and polyuria.  Genitourinary: Negative for dysuria, frequency and difficulty urinating.  Neurological: Negative for dizziness, light-headedness, numbness and headaches.    History Past Medical History  Diagnosis Date  . Asthma   . Anxiety   . MHA (microangiopathic hemolytic anemia)   . Hypertension     She has no past surgical history on file.   Her family history includes Asthma in an other family member; Diabetes in an other family member; Hyperlipidemia in an other family member; Throat cancer in her father.She reports that she has never smoked. She has never used smokeless tobacco. She reports that she drinks alcohol. She reports that she does not use illicit drugs.  Current Outpatient Prescriptions on File Prior to Visit  Medication Sig Dispense Refill  . Albuterol Sulfate (PROAIR RESPICLICK) 108 (90 BASE) MCG/ACT AEPB Inhale 2 puffs into the lungs every 6 (six) hours as needed. 1 each 2  . ALPRAZolam (XANAX) 0.5 MG tablet TAKE 1 TABLET THREE TIMES PER DAY FOR SLEEP OR ANXIETY 90 tablet 2  . celecoxib (CELEBREX) 200 MG capsule TAKE 1 CAPSULE BY MOUTH TWICE DAILY AS NEEDED 60 capsule 2  . montelukast (SINGULAIR) 10 MG tablet Take 1 tablet (10 mg total) by mouth at bedtime. 90 tablet 3  . SUMAtriptan (IMITREX) 100 MG tablet TAKE ONE TABLET BY MOUTH AS NEEDED FOR MIGRAINE AND  REPEAT IN 2 HOURSAS NEEDED 12 tablet 1  . topiramate (TOPAMAX) 50 MG tablet Take 1 tablet (50 mg total) by mouth 2 (two) times daily. 180 tablet 3  . TRAVEL SICKNESS 25 MG CHEW CHEW 1 TABLET EVERY 6 HOURS AS NEEDED 45 each 0   No current facility-administered medications on file prior to visit.     Objective:  Objective Physical Exam  Constitutional: She is oriented to person, place, and time. She appears well-developed and well-nourished.  HENT:  Head: Normocephalic and atraumatic.  Eyes: Conjunctivae and EOM are normal.  Neck: Normal range of motion. Neck supple. No JVD present. Carotid bruit is not present. No thyromegaly present.  Cardiovascular: Normal rate, regular rhythm and normal heart sounds.   No murmur heard. Pulmonary/Chest: Effort normal and breath sounds normal. No respiratory distress. She has no wheezes. She has no rales. She exhibits no tenderness.  Musculoskeletal: She exhibits no edema.  Neurological: She is alert and oriented to person, place, and time.  Psychiatric: She has a normal mood and affect. Her behavior is normal. Judgment and thought content normal.  Nursing note and vitals reviewed.  BP 108/62 mmHg  Pulse 66  Temp(Src) 98.2 F (36.8 C) (Oral)  Wt 173 lb (78.472 kg)  SpO2 98% Wt Readings from Last 3 Encounters:  11/17/14 173 lb (78.472 kg)  09/29/14 177 lb 9.6 oz (80.559 kg)  04/26/14 195 lb (88.451 kg)     Lab Results  Component Value Date   WBC 11.4* 03/20/2014   HGB 14.4 03/20/2014  HCT 44.0 03/20/2014   PLT 385.0 03/20/2014   GLUCOSE 77 09/29/2014   CHOL 168 09/29/2014   TRIG 144.0 09/29/2014   HDL 34.70* 09/29/2014   LDLDIRECT 107.1 03/20/2014   LDLCALC 105* 09/29/2014   ALT 13 09/29/2014   AST 16 09/29/2014   NA 141 09/29/2014   K 3.7 09/29/2014   CL 105 09/29/2014   CREATININE 0.77 09/29/2014   BUN 15 09/29/2014   CO2 26 09/29/2014   TSH 1.34 03/20/2014    US Venous Img Lower Unilateral Left  04/20/2014   CLINICAL  DATA:  Left calf pain for 1 week, worse over the last 2 days.  EXAM: LEFT LOWER EXTREMITY VENOUS DOPPLER ULTRASOUND  TECHNIQUE: Gray-scale sonography with graded compression, as well as color Doppler and duplex ultrasound were performed to evaluate the lower extremity deep venous systems from the level of the common femoral vein and including the common femoral, femoral, profunda femoral, popliteal and calf veins including the posterior tibial, peroneal and gastrocnemius veins when visible. The superficial great saphenous vein was also interrogated. Spectral Doppler was utilized to evaluate flow at rest and with distal augmentation maneuvers in the common femoral, femoral and popliteal veins.  COMPARISON:  None.  FINDINGS: Contralateral Common Femoral Vein: Respiratory phasicity is normal and symmetric with the symptomatic side. No evidence of thrombus. Normal compressibility.  Common Femoral Vein: No evidence of thrombus. Normal compressibility, respiratory phasicity and response to augmentation.  Saphenofemoral Junction: No evidence of thrombus. Normal compressibility and flow on color Doppler imaging.  Profunda Femoral Vein: No evidence of thrombus. Normal compressibility and flow on color Doppler imaging.  Femoral Vein: No evidence of thrombus. Normal compressibility, respiratory phasicity and response to augmentation.  Popliteal Vein: No evidence of thrombus. Normal compressibility, respiratory phasicity and response to augmentation.  Calf Veins: No evidence of thrombus. Normal compressibility and flow on color Doppler imaging.  Superficial Great Saphenous Vein: No evidence of thrombus. Normal compressibility and flow on color Doppler imaging.  Venous Reflux:  None.  Other Findings:  None.  IMPRESSION: No evidence of deep venous thrombosis.   Electronically Signed   By: Sebastian Ache   On: 04/20/2014 17:31     Assessment & Plan:  Plan I have discontinued Ms. Bozarth's lisinopril and predniSONE. I am also  having her maintain her montelukast, topiramate, Albuterol Sulfate, SUMAtriptan, celecoxib, TRAVEL SICKNESS, and ALPRAZolam.  No orders of the defined types were placed in this encounter.    Problem List Items Addressed This Visit    Essential hypertension - Primary    Stop lisinopril  Recheck 2-3 weeks          Follow-up: Return in about 2 weeks (around 12/01/2014), or if symptoms worsen or fail to improve, for bp check.  Loreen Freud, DO

## 2014-11-17 NOTE — Progress Notes (Signed)
Pre visit review using our clinic review tool, if applicable. No additional management support is needed unless otherwise documented below in the visit note. 

## 2014-11-17 NOTE — Patient Instructions (Signed)
Exercise to Lose Weight Exercise and a healthy diet may help you lose weight. Your doctor may suggest specific exercises. EXERCISE IDEAS AND TIPS  Choose low-cost things you enjoy doing, such as walking, bicycling, or exercising to workout videos.  Take stairs instead of the elevator.  Walk during your lunch break.  Park your car further away from work or school.  Go to a gym or an exercise class.  Start with 5 to 10 minutes of exercise each day. Build up to 30 minutes of exercise 4 to 6 days a week.  Wear shoes with good support and comfortable clothes.  Stretch before and after working out.  Work out until you breathe harder and your heart beats faster.  Drink extra water when you exercise.  Do not do so much that you hurt yourself, feel dizzy, or get very short of breath. Exercises that burn about 150 calories:  Running 1  miles in 15 minutes.  Playing volleyball for 45 to 60 minutes.  Washing and waxing a car for 45 to 60 minutes.  Playing touch football for 45 minutes.  Walking 1  miles in 35 minutes.  Pushing a stroller 1  miles in 30 minutes.  Playing basketball for 30 minutes.  Raking leaves for 30 minutes.  Bicycling 5 miles in 30 minutes.  Walking 2 miles in 30 minutes.  Dancing for 30 minutes.  Shoveling snow for 15 minutes.  Swimming laps for 20 minutes.  Walking up stairs for 15 minutes.  Bicycling 4 miles in 15 minutes.  Gardening for 30 to 45 minutes.  Jumping rope for 15 minutes.  Washing windows or floors for 45 to 60 minutes. Document Released: 04/05/2010 Document Revised: 05/26/2011 Document Reviewed: 04/05/2010 [employer] Patient Information 2015 [employer]. This information is not intended to replace advice given to you by your health care provider. Make sure you discuss any questions you have with your health care provider.  

## 2014-11-23 ENCOUNTER — Other Ambulatory Visit: Payer: Self-pay | Admitting: Family Medicine

## 2014-11-29 ENCOUNTER — Other Ambulatory Visit: Payer: Self-pay | Admitting: Family Medicine

## 2014-11-29 ENCOUNTER — Ambulatory Visit (INDEPENDENT_AMBULATORY_CARE_PROVIDER_SITE_OTHER): Payer: BLUE CROSS/BLUE SHIELD

## 2014-11-29 DIAGNOSIS — I10 Essential (primary) hypertension: Secondary | ICD-10-CM

## 2014-11-29 NOTE — Telephone Encounter (Signed)
Last filled: 05/19/14 Amt: 12, 1 Last OV: 11/17/14  Please advise.

## 2014-11-29 NOTE — Progress Notes (Signed)
Pre visit review using our clinic review tool, if applicable. No additional management support is needed unless otherwise documented below in the visit note. 

## 2014-11-29 NOTE — Progress Notes (Signed)
Pt came into clinic for blood pressure check.

## 2014-11-30 MED ORDER — SUMATRIPTAN SUCCINATE 100 MG PO TABS: ORAL_TABLET | ORAL | Status: DC

## 2014-11-30 NOTE — Addendum Note (Signed)
Addended by: Arnette Norris on: 11/30/2014 09:12 AM   Modules accepted: Orders

## 2014-11-30 NOTE — Telephone Encounter (Signed)
Rx faxed.    KP 

## 2014-12-21 ENCOUNTER — Encounter: Payer: Self-pay | Admitting: Family Medicine

## 2014-12-21 NOTE — Telephone Encounter (Signed)
Needs ov

## 2014-12-22 ENCOUNTER — Other Ambulatory Visit: Payer: Self-pay

## 2014-12-22 ENCOUNTER — Telehealth: Payer: Self-pay | Admitting: Family Medicine

## 2014-12-22 ENCOUNTER — Ambulatory Visit (INDEPENDENT_AMBULATORY_CARE_PROVIDER_SITE_OTHER): Payer: BLUE CROSS/BLUE SHIELD | Admitting: Family Medicine

## 2014-12-22 ENCOUNTER — Encounter: Payer: Self-pay | Admitting: Family Medicine

## 2014-12-22 DIAGNOSIS — J019 Acute sinusitis, unspecified: Secondary | ICD-10-CM | POA: Insufficient documentation

## 2014-12-22 DIAGNOSIS — J018 Other acute sinusitis: Secondary | ICD-10-CM

## 2014-12-22 MED ORDER — CLARITHROMYCIN ER 500 MG PO TB24: 1000.0000 mg | ORAL_TABLET | Freq: Every day | ORAL | Status: DC

## 2014-12-22 MED ORDER — METHYLPREDNISOLONE ACETATE 80 MG/ML IJ SUSP
80.0000 mg | Freq: Once | INTRAMUSCULAR | Status: AC
  Administered 2014-12-22: 80 mg via INTRAMUSCULAR

## 2014-12-22 MED ORDER — ALBUTEROL SULFATE (2.5 MG/3ML) 0.083% IN NEBU
2.5000 mg | INHALATION_SOLUTION | Freq: Once | RESPIRATORY_TRACT | Status: AC
  Administered 2014-12-22: 2.5 mg via RESPIRATORY_TRACT

## 2014-12-22 MED ORDER — MECLIZINE HCL 25 MG PO CHEW: CHEWABLE_TABLET | ORAL | Status: DC

## 2014-12-22 NOTE — Assessment & Plan Note (Signed)
Neb given in office Depo medrol 80 mg IM con't proair and dulera

## 2014-12-22 NOTE — Telephone Encounter (Signed)
Error

## 2014-12-22 NOTE — Assessment & Plan Note (Signed)
biaxin x 14 days con't nasacort and claritin rto prn

## 2014-12-22 NOTE — Progress Notes (Signed)
Pre visit review using our clinic review tool, if applicable. No additional management support is needed unless otherwise documented below in the visit note. 

## 2014-12-22 NOTE — Patient Instructions (Signed)

## 2014-12-22 NOTE — Telephone Encounter (Signed)
Gave verbal to pharmacist

## 2014-12-22 NOTE — Telephone Encounter (Signed)
°  Pharmacy: [facility] - [place], [place] - [address] [phone] (Phone) [phone] (Fax)         Reason for call:  Pharmacy called and stated Rx for clarithromycin (BIAXIN XL) 500 MG 24 hr tablet pharmacy requesting verbal order please call [facility] [phone]. Pharmacy states patient needs antibiotics

## 2014-12-22 NOTE — Progress Notes (Signed)
Patient ID: Deborah Watts, female   DOB: 05/17/1965, 49 y.o.   MRN: [id number]   Subjective:    Patient ID: Deborah Watts, female    DOB: [birth year], 49 y.o.   MRN: [id number]  Chief Complaint  Patient presents with  . Sore Throat    with ear pain and cough-non-productive  . Medication Refill    Meclizine    HPI Patient is in today for c/o sinus congestion and wheezing x several days.  No fever.  + sinus pressure, headache.  Pt is using her antihistamine and nasacort with no relief.    Past Medical History  Diagnosis Date  . Asthma   . Anxiety   . MHA (microangiopathic hemolytic anemia) (HCC)   . Hypertension     No past surgical history on file.  Family History  Problem Relation Age of Onset  . Asthma    . Diabetes    . Hyperlipidemia    . Throat cancer Father     Social History   Social History  . Marital Status: Married    Spouse Name: N/A  . Number of Children: N/A  . Years of Education: N/A   Occupational History  . [employer]    Social History Main Topics  . Smoking status: Never Smoker   . Smokeless tobacco: Never Used  . Alcohol Use: 0.0 oz/week    0 Standard drinks or equivalent per week     Comment: occassional  . Drug Use: No  . Sexual Activity:    Partners: Male   Other Topics Concern  . Not on file   Social History Narrative   Exercise-- no    Outpatient Prescriptions Prior to Visit  Medication Sig Dispense Refill  . Albuterol Sulfate (PROAIR RESPICLICK) 108 (90 BASE) MCG/ACT AEPB Inhale 2 puffs into the lungs every 6 (six) hours as needed. 1 each 2  . ALPRAZolam (XANAX) 0.5 MG tablet TAKE 1 TABLET THREE TIMES PER DAY FOR SLEEP OR ANXIETY 90 tablet 2  . celecoxib (CELEBREX) 200 MG capsule TAKE 1 CAPSULE TWICE DAILY AS NEEDED 60 capsule 3  . montelukast (SINGULAIR) 10 MG tablet Take 1 tablet (10 mg total) by mouth at bedtime. 90 tablet 3  . SUMAtriptan (IMITREX) 100 MG tablet TAKE 1 TABLET BY MOUTH AS NEEDED FOR MIGRAINE AND REPEAT IN 2 HOURS  AS NEEDED 12 tablet 1  . topiramate (TOPAMAX) 50 MG tablet Take 1 tablet (50 mg total) by mouth 2 (two) times daily. 180 tablet 3  . TRAVEL SICKNESS 25 MG CHEW CHEW 1 TABLET EVERY 6 HOURS AS NEEDED 45 each 0   No facility-administered medications prior to visit.    Allergies  Allergen Reactions  . Fish Allergy Hives and Swelling  . Penicillins Anaphylaxis and Hives  . Iohexol      Desc: hives, sob, pt. needs 13 hr prep   01/16/05   . Ivp Dye [Iodinated Diagnostic Agents] Hives and Swelling    Review of Systems  Constitutional: Negative for fever and malaise/fatigue.  HENT: Positive for ear pain and sore throat. Negative for congestion.   Eyes: Negative for discharge.  Respiratory: Positive for shortness of breath.   Cardiovascular: Negative for chest pain, palpitations and leg swelling.  Gastrointestinal: Negative for nausea and abdominal pain.  Genitourinary: Negative for dysuria.  Musculoskeletal: Negative for falls.  Skin: Negative for rash.  Neurological: Negative for loss of consciousness and headaches.  Endo/Heme/Allergies: Negative for environmental allergies.  Psychiatric/Behavioral: Negative for depression. The patient  is not nervous/anxious.        Objective:    Physical Exam  Constitutional: She is oriented to person, place, and time. She appears well-developed and well-nourished.  HENT:  Right Ear: External ear normal.  Left Ear: External ear normal.  Nose: Rhinorrhea present. Right sinus exhibits maxillary sinus tenderness and frontal sinus tenderness. Left sinus exhibits maxillary sinus tenderness and frontal sinus tenderness.  Mouth/Throat: Posterior oropharyngeal erythema present. No oropharyngeal exudate.  + PND + errythema  Eyes: Conjunctivae are normal. Right eye exhibits no discharge. Left eye exhibits no discharge.  Cardiovascular: Normal rate, regular rhythm and normal heart sounds.   No murmur heard. Pulmonary/Chest: Effort normal. No respiratory  distress. She has wheezes. She has no rales. She exhibits no tenderness.  Musculoskeletal: She exhibits no edema.  Lymphadenopathy:    She has cervical adenopathy.  Neurological: She is alert and oriented to person, place, and time.  Psychiatric: She has a normal mood and affect. Her behavior is normal.  Nursing note and vitals reviewed.   BP 115/70 mmHg  Pulse 73  Temp(Src) 98.2 F (36.8 C) (Oral)  Ht  (1.499 m)  Wt 175 lb (79.379 kg)  BMI 35.33 kg/m2  SpO2 97% Wt Readings from Last 3 Encounters:  12/22/14 175 lb (79.379 kg)  11/17/14 173 lb (78.472 kg)  09/29/14 177 lb 9.6 oz (80.559 kg)     Lab Results  Component Value Date   WBC 11.4* 03/20/2014   HGB 14.4 03/20/2014   HCT 44.0 03/20/2014   PLT 385.0 03/20/2014   GLUCOSE 77 09/29/2014   CHOL 168 09/29/2014   TRIG 144.0 09/29/2014   HDL 34.70* 09/29/2014   LDLDIRECT 107.1 03/20/2014   LDLCALC 105* 09/29/2014   ALT 13 09/29/2014   AST 16 09/29/2014   NA 141 09/29/2014   K 3.7 09/29/2014   CL 105 09/29/2014   CREATININE 0.77 09/29/2014   BUN 15 09/29/2014   CO2 26 09/29/2014   TSH 1.34 03/20/2014    Lab Results  Component Value Date   TSH 1.34 03/20/2014   Lab Results  Component Value Date   WBC 11.4* 03/20/2014   HGB 14.4 03/20/2014   HCT 44.0 03/20/2014   MCV 86.4 03/20/2014   PLT 385.0 03/20/2014   Lab Results  Component Value Date   NA 141 09/29/2014   K 3.7 09/29/2014   CO2 26 09/29/2014   GLUCOSE 77 09/29/2014   BUN 15 09/29/2014   CREATININE 0.77 09/29/2014   BILITOT 0.3 09/29/2014   ALKPHOS 62 09/29/2014   AST 16 09/29/2014   ALT 13 09/29/2014   PROT 7.3 09/29/2014   ALBUMIN 4.2 09/29/2014   CALCIUM 9.6 09/29/2014   GFR 84.25 09/29/2014   Lab Results  Component Value Date   CHOL 168 09/29/2014   Lab Results  Component Value Date   HDL 34.70* 09/29/2014   Lab Results  Component Value Date   LDLCALC 105* 09/29/2014   Lab Results  Component Value Date   TRIG 144.0  09/29/2014   Lab Results  Component Value Date   CHOLHDL 5 09/29/2014   No results found for: HGBA1C     Assessment & Plan:   Problem List Items Addressed This Visit    None    Visit Diagnoses    Other acute sinusitis        Relevant Medications    methylPREDNISolone acetate (DEPO-MEDROL) injection 80 mg (Completed)    albuterol (PROVENTIL) (2.5 MG/3ML) 0.083% nebulizer solution 2.5  mg (Completed)    clarithromycin (BIAXIN XL) 500 MG 24 hr tablet       I have changed Ms. Mcnew's TRAVEL SICKNESS to Meclizine HCl. I am also having her maintain her montelukast, topiramate, Albuterol Sulfate, ALPRAZolam, celecoxib, SUMAtriptan, and clarithromycin. We administered methylPREDNISolone acetate and albuterol.  Meds ordered this encounter  Medications  . Meclizine HCl (TRAVEL SICKNESS) 25 MG CHEW    Sig: CHEW 1 TABLET EVERY 6 HOURS AS NEEDED    Dispense:  45 each    Refill:  2  . DISCONTD: clarithromycin (BIAXIN XL) 500 MG 24 hr tablet    Sig: Take 2 tablets (1,000 mg total) by mouth daily.    Dispense:  28 tablet    Refill:  o  . methylPREDNISolone acetate (DEPO-MEDROL) injection 80 mg    Sig:   . albuterol (PROVENTIL) (2.5 MG/3ML) 0.083% nebulizer solution 2.5 mg    Sig:   . clarithromycin (BIAXIN XL) 500 MG 24 hr tablet    Sig: Take 2 tablets (1,000 mg total) by mouth daily.    Dispense:  28 tablet    Refill:  o     Loreen Freud, DO

## 2015-01-17 ENCOUNTER — Other Ambulatory Visit: Payer: Self-pay | Admitting: Family Medicine

## 2015-01-18 NOTE — Telephone Encounter (Signed)
Last seen 12/22/14 and filled 10/05/14 #90 with 2 refills.  Please advise     KP

## 2015-01-29 ENCOUNTER — Encounter: Payer: Self-pay | Admitting: Family Medicine

## 2015-01-29 ENCOUNTER — Other Ambulatory Visit: Payer: Self-pay | Admitting: Family Medicine

## 2015-01-29 DIAGNOSIS — J4520 Mild intermittent asthma, uncomplicated: Secondary | ICD-10-CM

## 2015-01-29 MED ORDER — MOMETASONE FURO-FORMOTEROL FUM 200-5 MCG/ACT IN AERO: 2.0000 | INHALATION_SPRAY | Freq: Two times a day (BID) | RESPIRATORY_TRACT | Status: DC

## 2015-03-16 ENCOUNTER — Encounter: Payer: Self-pay | Admitting: Family Medicine

## 2015-03-16 ENCOUNTER — Ambulatory Visit (INDEPENDENT_AMBULATORY_CARE_PROVIDER_SITE_OTHER): Payer: BLUE CROSS/BLUE SHIELD | Admitting: Family Medicine

## 2015-03-16 VITALS — BP 114/68 | HR 79 | Temp 98.1°F | Ht 59.0 in | Wt 178.4 lb

## 2015-03-16 DIAGNOSIS — J4521 Mild intermittent asthma with (acute) exacerbation: Secondary | ICD-10-CM | POA: Diagnosis not present

## 2015-03-16 DIAGNOSIS — J0110 Acute frontal sinusitis, unspecified: Secondary | ICD-10-CM

## 2015-03-16 DIAGNOSIS — J209 Acute bronchitis, unspecified: Secondary | ICD-10-CM

## 2015-03-16 MED ORDER — METHYLPREDNISOLONE ACETATE 80 MG/ML IJ SUSP
80.0000 mg | Freq: Once | INTRAMUSCULAR | Status: AC
Start: 2015-03-16 — End: 2015-03-16
  Administered 2015-03-16: 80 mg via INTRAMUSCULAR

## 2015-03-16 MED ORDER — FLUTICASONE PROPIONATE 50 MCG/ACT NA SUSP: 2.0000 | Freq: Every day | NASAL | Status: DC

## 2015-03-16 MED ORDER — PREDNISONE 10 MG PO TABS: ORAL_TABLET | ORAL | Status: DC

## 2015-03-16 MED ORDER — CLARITHROMYCIN ER 500 MG PO TB24: 1000.0000 mg | ORAL_TABLET | Freq: Every day | ORAL | Status: AC

## 2015-03-16 NOTE — Patient Instructions (Signed)

## 2015-03-16 NOTE — Progress Notes (Signed)
Pre visit review using our clinic review tool, if applicable. No additional management support is needed unless otherwise documented below in the visit note. 

## 2015-03-16 NOTE — Progress Notes (Signed)
Patient ID: Deborah Watts, female    DOB: 1966/01/09  Age: 49 y.o. MRN: [id number]    Subjective:  Subjective HPI Deborah Watts presents for uri symptoms.  X 1 week.  No fevers, chills.   + sinus pressure and nasal congestion.    Review of Systems  Constitutional: Negative for fever, chills, diaphoresis, appetite change, fatigue and unexpected weight change.  HENT: Positive for congestion, postnasal drip, rhinorrhea and sinus pressure.   Eyes: Negative for pain, redness and visual disturbance.  Respiratory: Negative for cough, chest tightness, shortness of breath and wheezing.   Cardiovascular: Negative for chest pain, palpitations and leg swelling.  Endocrine: Negative for cold intolerance, heat intolerance, polydipsia, polyphagia and polyuria.  Genitourinary: Negative for dysuria, frequency and difficulty urinating.  Allergic/Immunologic: Negative for environmental allergies.  Neurological: Negative for dizziness, light-headedness, numbness and headaches.    History Past Medical History  Diagnosis Date  . Asthma   . Anxiety   . MHA (microangiopathic hemolytic anemia) (HCC)   . Hypertension     She has no past surgical history on file.   Her family history includes Throat cancer in her father.She reports that she has never smoked. She has never used smokeless tobacco. She reports that she drinks alcohol. She reports that she does not use illicit drugs.  Current Outpatient Prescriptions on File Prior to Visit  Medication Sig Dispense Refill  . Albuterol Sulfate (PROAIR RESPICLICK) 108 (90 BASE) MCG/ACT AEPB Inhale 2 puffs into the lungs every 6 (six) hours as needed. 1 each 2  . ALPRAZolam (XANAX) 0.5 MG tablet TAKE ONE TABLET BY MOUTH THREE TIMES DAILY FOR SLEEP OR ANXIETY 90 tablet 1  . celecoxib (CELEBREX) 200 MG capsule TAKE 1 CAPSULE TWICE DAILY AS NEEDED 60 capsule 3  . Meclizine HCl (TRAVEL SICKNESS) 25 MG CHEW CHEW 1 TABLET EVERY 6 HOURS AS NEEDED 45 each 2  .  mometasone-formoterol (DULERA) 200-5 MCG/ACT AERO Inhale 2 puffs into the lungs 2 (two) times daily. 1 Inhaler 5  . montelukast (SINGULAIR) 10 MG tablet Take 1 tablet (10 mg total) by mouth at bedtime. 90 tablet 3  . SUMAtriptan (IMITREX) 100 MG tablet TAKE 1 TABLET BY MOUTH AS NEEDED FOR MIGRAINE AND REPEAT IN 2 HOURS AS NEEDED 12 tablet 1  . topiramate (TOPAMAX) 50 MG tablet Take 1 tablet (50 mg total) by mouth 2 (two) times daily. 180 tablet 3   No current facility-administered medications on file prior to visit.     Objective:  Objective Physical Exam  Constitutional: She is oriented to person, place, and time. She appears well-developed and well-nourished.  HENT:  Right Ear: External ear normal.  Left Ear: External ear normal.  + PND + errythema  Eyes: Conjunctivae are normal. Right eye exhibits no discharge. Left eye exhibits no discharge.  Cardiovascular: Normal rate, regular rhythm and normal heart sounds.   No murmur heard. Pulmonary/Chest: Effort normal and breath sounds normal. No respiratory distress. She has no wheezes. She has no rales. She exhibits no tenderness.  Musculoskeletal: She exhibits no edema.  Lymphadenopathy:    She has cervical adenopathy.  Neurological: She is alert and oriented to person, place, and time.   BP 114/68 mmHg  Pulse 79  Temp(Src) 98.1 F (36.7 C) (Oral)  Ht  (1.499 m)  Wt 178 lb 6.4 oz (80.922 kg)  BMI 36.01 kg/m2  SpO2 98% Wt Readings from Last 3 Encounters:  03/16/15 178 lb 6.4 oz (80.922 kg)  12/22/14 175  lb (79.379 kg)  11/17/14 173 lb (78.472 kg)     Lab Results  Component Value Date   WBC 11.4* 03/20/2014   HGB 14.4 03/20/2014   HCT 44.0 03/20/2014   PLT 385.0 03/20/2014   GLUCOSE 77 09/29/2014   CHOL 168 09/29/2014   TRIG 144.0 09/29/2014   HDL 34.70* 09/29/2014   LDLDIRECT 107.1 03/20/2014   LDLCALC 105* 09/29/2014   ALT 13 09/29/2014   AST 16 09/29/2014   NA 141 09/29/2014   K 3.7 09/29/2014   CL 105  09/29/2014   CREATININE 0.77 09/29/2014   BUN 15 09/29/2014   CO2 26 09/29/2014   TSH 1.34 03/20/2014    US Venous Img Lower Unilateral Left  04/20/2014  CLINICAL DATA:  Left calf pain for 1 week, worse over the last 2 days. EXAM: LEFT LOWER EXTREMITY VENOUS DOPPLER ULTRASOUND TECHNIQUE: Gray-scale sonography with graded compression, as well as color Doppler and duplex ultrasound were performed to evaluate the lower extremity deep venous systems from the level of the common femoral vein and including the common femoral, femoral, profunda femoral, popliteal and calf veins including the posterior tibial, peroneal and gastrocnemius veins when visible. The superficial great saphenous vein was also interrogated. Spectral Doppler was utilized to evaluate flow at rest and with distal augmentation maneuvers in the common femoral, femoral and popliteal veins. COMPARISON:  None. FINDINGS: Contralateral Common Femoral Vein: Respiratory phasicity is normal and symmetric with the symptomatic side. No evidence of thrombus. Normal compressibility. Common Femoral Vein: No evidence of thrombus. Normal compressibility, respiratory phasicity and response to augmentation. Saphenofemoral Junction: No evidence of thrombus. Normal compressibility and flow on color Doppler imaging. Profunda Femoral Vein: No evidence of thrombus. Normal compressibility and flow on color Doppler imaging. Femoral Vein: No evidence of thrombus. Normal compressibility, respiratory phasicity and response to augmentation. Popliteal Vein: No evidence of thrombus. Normal compressibility, respiratory phasicity and response to augmentation. Calf Veins: No evidence of thrombus. Normal compressibility and flow on color Doppler imaging. Superficial Great Saphenous Vein: No evidence of thrombus. Normal compressibility and flow on color Doppler imaging. Venous Reflux:  None. Other Findings:  None. IMPRESSION: No evidence of deep venous thrombosis. Electronically  Signed   By: Sebastian Ache   On: 04/20/2014 17:31     Assessment & Plan:  Plan I have discontinued Deborah Watts's clarithromycin. I am also having her start on predniSONE, clarithromycin, and fluticasone. Additionally, I am having her maintain her montelukast, topiramate, Albuterol Sulfate, celecoxib, SUMAtriptan, Meclizine HCl, ALPRAZolam, and mometasone-formoterol. We administered methylPREDNISolone acetate.  Meds ordered this encounter  Medications  . predniSONE (DELTASONE) 10 MG tablet    Sig: 3 po qd for 3 days then 2 po qd for 3 days the 1 po qd for 3 days    Dispense:  18 tablet    Refill:  0  . clarithromycin (BIAXIN XL) 500 MG 24 hr tablet    Sig: Take 2 tablets (1,000 mg total) by mouth daily.    Dispense:  28 tablet    Refill:  0  . fluticasone (FLONASE) 50 MCG/ACT nasal spray    Sig: Place 2 sprays into both nostrils daily.    Dispense:  16 g    Refill:  6  . methylPREDNISolone acetate (DEPO-MEDROL) injection 80 mg    Sig:     Problem List Items Addressed This Visit    Sinusitis, acute   Relevant Medications   predniSONE (DELTASONE) 10 MG tablet   clarithromycin (BIAXIN XL) 500 MG  24 hr tablet   fluticasone (FLONASE) 50 MCG/ACT nasal spray   methylPREDNISolone acetate (DEPO-MEDROL) injection 80 mg (Completed)   ASTHMATIC BRONCHITIS, ACUTE   Relevant Medications   predniSONE (DELTASONE) 10 MG tablet   clarithromycin (BIAXIN XL) 500 MG 24 hr tablet   methylPREDNISolone acetate (DEPO-MEDROL) injection 80 mg (Completed)   Asthma with acute exacerbation - Primary   Relevant Medications   predniSONE (DELTASONE) 10 MG tablet   methylPREDNISolone acetate (DEPO-MEDROL) injection 80 mg (Completed)      Cont home meds-- singulair ,dulera and proair  Follow-up: Return if symptoms worsen or fail to improve.  Loreen FreudYvonne Lowne, DO

## 2015-03-23 ENCOUNTER — Ambulatory Visit (INDEPENDENT_AMBULATORY_CARE_PROVIDER_SITE_OTHER): Payer: BLUE CROSS/BLUE SHIELD | Admitting: Family Medicine

## 2015-03-23 ENCOUNTER — Encounter: Payer: Self-pay | Admitting: Family Medicine

## 2015-03-23 VITALS — BP 116/72 | HR 70 | Temp 97.8°F | Ht 61.0 in | Wt 175.6 lb

## 2015-03-23 DIAGNOSIS — J4521 Mild intermittent asthma with (acute) exacerbation: Secondary | ICD-10-CM

## 2015-03-23 DIAGNOSIS — J45909 Unspecified asthma, uncomplicated: Secondary | ICD-10-CM

## 2015-03-23 DIAGNOSIS — Z0000 Encounter for general adult medical examination without abnormal findings: Secondary | ICD-10-CM | POA: Diagnosis not present

## 2015-03-23 DIAGNOSIS — Z8669 Personal history of other diseases of the nervous system and sense organs: Secondary | ICD-10-CM | POA: Diagnosis not present

## 2015-03-23 DIAGNOSIS — Z1231 Encounter for screening mammogram for malignant neoplasm of breast: Secondary | ICD-10-CM | POA: Diagnosis not present

## 2015-03-23 DIAGNOSIS — F411 Generalized anxiety disorder: Secondary | ICD-10-CM

## 2015-03-23 DIAGNOSIS — M1990 Unspecified osteoarthritis, unspecified site: Secondary | ICD-10-CM

## 2015-03-23 DIAGNOSIS — Z114 Encounter for screening for human immunodeficiency virus [HIV]: Secondary | ICD-10-CM

## 2015-03-23 LAB — CBC WITH DIFFERENTIAL/PLATELET
BASOS ABS: 0.1 10*3/uL (ref 0.0–0.1)
Basophils Relative: 0.6 % (ref 0.0–3.0)
EOS ABS: 0.4 10*3/uL (ref 0.0–0.7)
Eosinophils Relative: 2.7 % (ref 0.0–5.0)
HEMATOCRIT: 43.5 % (ref 36.0–46.0)
HEMOGLOBIN: 14.3 g/dL (ref 12.0–15.0)
LYMPHS PCT: 29.8 % (ref 12.0–46.0)
Lymphs Abs: 4.3 10*3/uL — ABNORMAL HIGH (ref 0.7–4.0)
MCHC: 33 g/dL (ref 30.0–36.0)
MCV: 85.7 fl (ref 78.0–100.0)
MONOS PCT: 6.1 % (ref 3.0–12.0)
Monocytes Absolute: 0.9 10*3/uL (ref 0.1–1.0)
NEUTROS ABS: 8.8 10*3/uL — AB (ref 1.4–7.7)
Neutrophils Relative %: 60.8 % (ref 43.0–77.0)
PLATELETS: 337 10*3/uL (ref 150.0–400.0)
RBC: 5.08 Mil/uL (ref 3.87–5.11)
RDW: 14.4 % (ref 11.5–15.5)
WBC: 14.6 10*3/uL — AB (ref 4.0–10.5)

## 2015-03-23 LAB — LIPID PANEL
CHOL/HDL RATIO: 4
Cholesterol: 187 mg/dL (ref 0–200)
HDL: 51.3 mg/dL (ref >39.00)
LDL Cholesterol: 96 mg/dL (ref 0–99)
NONHDL: 135.39
TRIGLYCERIDES: 199 mg/dL — AB (ref 0.0–149.0)
VLDL: 39.8 mg/dL (ref 0.0–40.0)

## 2015-03-23 LAB — COMPREHENSIVE METABOLIC PANEL
ALBUMIN: 4.4 g/dL (ref 3.5–5.2)
ALK PHOS: 60 U/L (ref 39–117)
ALT: 13 U/L (ref 0–35)
AST: 12 U/L (ref 0–37)
BILIRUBIN TOTAL: 0.3 mg/dL (ref 0.2–1.2)
BUN: 14 mg/dL (ref 6–23)
CALCIUM: 10 mg/dL (ref 8.4–10.5)
CO2: 31 meq/L (ref 19–32)
CREATININE: 0.81 mg/dL (ref 0.40–1.20)
Chloride: 107 mEq/L (ref 96–112)
GFR: 79.31 mL/min (ref >60.00)
Glucose, Bld: 90 mg/dL (ref 70–99)
Potassium: 4.5 mEq/L (ref 3.5–5.1)
Sodium: 144 mEq/L (ref 135–145)
TOTAL PROTEIN: 7.2 g/dL (ref 6.0–8.3)

## 2015-03-23 LAB — TSH: TSH: 1 u[IU]/mL (ref 0.35–4.50)

## 2015-03-23 MED ORDER — MONTELUKAST SODIUM 10 MG PO TABS: 10.0000 mg | ORAL_TABLET | Freq: Every day | ORAL | Status: DC

## 2015-03-23 MED ORDER — SUMATRIPTAN SUCCINATE 100 MG PO TABS: ORAL_TABLET | ORAL | Status: DC

## 2015-03-23 MED ORDER — TOPIRAMATE 50 MG PO TABS: 50.0000 mg | ORAL_TABLET | Freq: Two times a day (BID) | ORAL | Status: DC

## 2015-03-23 MED ORDER — ALPRAZOLAM 0.5 MG PO TABS: ORAL_TABLET | ORAL | Status: DC

## 2015-03-23 MED ORDER — CELECOXIB 200 MG PO CAPS: ORAL_CAPSULE | ORAL | Status: DC

## 2015-03-23 NOTE — Progress Notes (Signed)
Pre visit review using our clinic review tool, if applicable. No additional management support is needed unless otherwise documented below in the visit note. 

## 2015-03-23 NOTE — Assessment & Plan Note (Signed)
Stable  con't main meds

## 2015-03-23 NOTE — Patient Instructions (Signed)
Preventive Care for Adults, Female A healthy lifestyle and preventive care can promote health and wellness. Preventive health guidelines for women include the following key practices.  A routine yearly physical is a good way to check with your health care provider about your health and preventive screening. It is a chance to share any concerns and updates on your health and to receive a thorough exam.  Visit your dentist for a routine exam and preventive care every 6 months. Brush your teeth twice a day and floss once a day. Good oral hygiene prevents tooth decay and gum disease.  The frequency of eye exams is based on your age, health, family medical history, use of contact lenses, and other factors. Follow your health care provider's recommendations for frequency of eye exams.  Eat a healthy diet. Foods like vegetables, fruits, whole grains, low-fat dairy products, and lean protein foods contain the nutrients you need without too many calories. Decrease your intake of foods high in solid fats, added sugars, and salt. Eat the right amount of calories for you.Get information about a proper diet from your health care provider, if necessary.  Regular physical exercise is one of the most important things you can do for your health. Most adults should get at least 150 minutes of moderate-intensity exercise (any activity that increases your heart rate and causes you to sweat) each week. In addition, most adults need muscle-strengthening exercises on 2 or more days a week.  Maintain a healthy weight. The body mass index (BMI) is a screening tool to identify possible weight problems. It provides an estimate of body fat based on height and weight. Your health care provider can find your BMI and can help you achieve or maintain a healthy weight.For adults 20 years and older:  A BMI below 18.5 is considered underweight.  A BMI of 18.5 to 24.9 is normal.  A BMI of 25 to 29.9 is considered overweight.  A  BMI of 30 and above is considered obese.  Maintain normal blood lipids and cholesterol levels by exercising and minimizing your intake of saturated fat. Eat a balanced diet with plenty of fruit and vegetables. Blood tests for lipids and cholesterol should begin at age 45 and be repeated every 5 years. If your lipid or cholesterol levels are high, you are over 50, or you are at high risk for heart disease, you may need your cholesterol levels checked more frequently.Ongoing high lipid and cholesterol levels should be treated with medicines if diet and exercise are not working.  If you smoke, find out from your health care provider how to quit. If you do not use tobacco, do not start.  Lung cancer screening is recommended for adults aged 45-80 years who are at high risk for developing lung cancer because of a history of smoking. A yearly low-dose CT scan of the lungs is recommended for people who have at least a 30-pack-year history of smoking and are a current smoker or have quit within the past 15 years. A pack year of smoking is smoking an average of 1 pack of cigarettes a day for 1 year (for example: 1 pack a day for 30 years or 2 packs a day for 15 years). Yearly screening should continue until the smoker has stopped smoking for at least 15 years. Yearly screening should be stopped for people who develop a health problem that would prevent them from having lung cancer treatment.  If you are pregnant, do not drink alcohol. If you are  breastfeeding, be very cautious about drinking alcohol. If you are not pregnant and choose to drink alcohol, do not have more than 1 drink per day. One drink is considered to be 12 ounces (355 mL) of beer, 5 ounces (148 mL) of wine, or 1.5 ounces (44 mL) of liquor.  Avoid use of street drugs. Do not share needles with anyone. Ask for help if you need support or instructions about stopping the use of drugs.  High blood pressure causes heart disease and increases the risk  of stroke. Your blood pressure should be checked at least every 1 to 2 years. Ongoing high blood pressure should be treated with medicines if weight loss and exercise do not work.  If you are 55-79 years old, ask your health care provider if you should take aspirin to prevent strokes.  Diabetes screening is done by taking a blood sample to check your blood glucose level after you have not eaten for a certain period of time (fasting). If you are not overweight and you do not have risk factors for diabetes, you should be screened once every 3 years starting at age 45. If you are overweight or obese and you are 40-70 years of age, you should be screened for diabetes every year as part of your cardiovascular risk assessment.  Breast cancer screening is essential preventive care for women. You should practice "breast self-awareness." This means understanding the normal appearance and feel of your breasts and may include breast self-examination. Any changes detected, no matter how small, should be reported to a health care provider. Women in their 20s and 30s should have a clinical breast exam (CBE) by a health care provider as part of a regular health exam every 1 to 3 years. After age 40, women should have a CBE every year. Starting at age 40, women should consider having a mammogram (breast X-ray test) every year. Women who have a family history of breast cancer should talk to their health care provider about genetic screening. Women at a high risk of breast cancer should talk to their health care providers about having an MRI and a mammogram every year.  Breast cancer gene (BRCA)-related cancer risk assessment is recommended for women who have family members with BRCA-related cancers. BRCA-related cancers include breast, ovarian, tubal, and peritoneal cancers. Having family members with these cancers may be associated with an increased risk for harmful changes (mutations) in the breast cancer genes BRCA1 and  BRCA2. Results of the assessment will determine the need for genetic counseling and BRCA1 and BRCA2 testing.  Your health care provider may recommend that you be screened regularly for cancer of the pelvic organs (ovaries, uterus, and vagina). This screening involves a pelvic examination, including checking for microscopic changes to the surface of your cervix (Pap test). You may be encouraged to have this screening done every 3 years, beginning at age 21.  For women ages 30-65, health care providers may recommend pelvic exams and Pap testing every 3 years, or they may recommend the Pap and pelvic exam, combined with testing for human papilloma virus (HPV), every 5 years. Some types of HPV increase your risk of cervical cancer. Testing for HPV may also be done on women of any age with unclear Pap test results.  Other health care providers may not recommend any screening for nonpregnant women who are considered low risk for pelvic cancer and who do not have symptoms. Ask your health care provider if a screening pelvic exam is right for   you.  If you have had past treatment for cervical cancer or a condition that could lead to cancer, you need Pap tests and screening for cancer for at least 20 years after your treatment. If Pap tests have been discontinued, your risk factors (such as having a new sexual partner) need to be reassessed to determine if screening should resume. Some women have medical problems that increase the chance of getting cervical cancer. In these cases, your health care provider may recommend more frequent screening and Pap tests.  Colorectal cancer can be detected and often prevented. Most routine colorectal cancer screening begins at the age of 50 years and continues through age 75 years. However, your health care provider may recommend screening at an earlier age if you have risk factors for colon cancer. On a yearly basis, your health care provider may provide home test kits to check  for hidden blood in the stool. Use of a small camera at the end of a tube, to directly examine the colon (sigmoidoscopy or colonoscopy), can detect the earliest forms of colorectal cancer. Talk to your health care provider about this at age 50, when routine screening begins. Direct exam of the colon should be repeated every 5-10 years through age 75 years, unless early forms of precancerous polyps or small growths are found.  People who are at an increased risk for hepatitis B should be screened for this virus. You are considered at high risk for hepatitis B if:  You were born in a country where hepatitis B occurs often. Talk with your health care provider about which countries are considered high risk.  Your parents were born in a high-risk country and you have not received a shot to protect against hepatitis B (hepatitis B vaccine).  You have HIV or AIDS.  You use needles to inject street drugs.  You live with, or have sex with, someone who has hepatitis B.  You get hemodialysis treatment.  You take certain medicines for conditions like cancer, organ transplantation, and autoimmune conditions.  Hepatitis C blood testing is recommended for all people born from 1945 through 1965 and any individual with known risks for hepatitis C.  Practice safe sex. Use condoms and avoid high-risk sexual practices to reduce the spread of sexually transmitted infections (STIs). STIs include gonorrhea, chlamydia, syphilis, trichomonas, herpes, HPV, and human immunodeficiency virus (HIV). Herpes, HIV, and HPV are viral illnesses that have no cure. They can result in disability, cancer, and death.  You should be screened for sexually transmitted illnesses (STIs) including gonorrhea and chlamydia if:  You are sexually active and are younger than 24 years.  You are older than 24 years and your health care provider tells you that you are at risk for this type of infection.  Your sexual activity has changed  since you were last screened and you are at an increased risk for chlamydia or gonorrhea. Ask your health care provider if you are at risk.  If you are at risk of being infected with HIV, it is recommended that you take a prescription medicine daily to prevent HIV infection. This is called preexposure prophylaxis (PrEP). You are considered at risk if:  You are sexually active and do not regularly use condoms or know the HIV status of your partner(s).  You take drugs by injection.  You are sexually active with a partner who has HIV.  Talk with your health care provider about whether you are at high risk of being infected with HIV. If   you choose to begin PrEP, you should first be tested for HIV. You should then be tested every 3 months for as long as you are taking PrEP.  Osteoporosis is a disease in which the bones lose minerals and strength with aging. This can result in serious bone fractures or breaks. The risk of osteoporosis can be identified using a bone density scan. Women ages 67 years and over and women at risk for fractures or osteoporosis should discuss screening with their health care providers. Ask your health care provider whether you should take a calcium supplement or vitamin D to reduce the rate of osteoporosis.  Menopause can be associated with physical symptoms and risks. Hormone replacement therapy is available to decrease symptoms and risks. You should talk to your health care provider about whether hormone replacement therapy is right for you.  Use sunscreen. Apply sunscreen liberally and repeatedly throughout the day. You should seek shade when your shadow is shorter than you. Protect yourself by wearing long sleeves, pants, a wide-brimmed hat, and sunglasses year round, whenever you are outdoors.  Once a month, do a whole body skin exam, using a mirror to look at the skin on your back. Tell your health care provider of new moles, moles that have irregular borders, moles that  are larger than a pencil eraser, or moles that have changed in shape or color.  Stay current with required vaccines (immunizations).  Influenza vaccine. All adults should be immunized every year.  Tetanus, diphtheria, and acellular pertussis (Td, Tdap) vaccine. Pregnant women should receive 1 dose of Tdap vaccine during each pregnancy. The dose should be obtained regardless of the length of time since the last dose. Immunization is preferred during the 27th-36th week of gestation. An adult who has not previously received Tdap or who does not know her vaccine status should receive 1 dose of Tdap. This initial dose should be followed by tetanus and diphtheria toxoids (Td) booster doses every 10 years. Adults with an unknown or incomplete history of completing a 3-dose immunization series with Td-containing vaccines should begin or complete a primary immunization series including a Tdap dose. Adults should receive a Td booster every 10 years.  Varicella vaccine. An adult without evidence of immunity to varicella should receive 2 doses or a second dose if she has previously received 1 dose. Pregnant females who do not have evidence of immunity should receive the first dose after pregnancy. This first dose should be obtained before leaving the health care facility. The second dose should be obtained 4-8 weeks after the first dose.  Human papillomavirus (HPV) vaccine. Females aged 13-26 years who have not received the vaccine previously should obtain the 3-dose series. The vaccine is not recommended for use in pregnant females. However, pregnancy testing is not needed before receiving a dose. If a female is found to be pregnant after receiving a dose, no treatment is needed. In that case, the remaining doses should be delayed until after the pregnancy. Immunization is recommended for any person with an immunocompromised condition through the age of 61 years if she did not get any or all doses earlier. During the  3-dose series, the second dose should be obtained 4-8 weeks after the first dose. The third dose should be obtained 24 weeks after the first dose and 16 weeks after the second dose.  Zoster vaccine. One dose is recommended for adults aged 30 years or older unless certain conditions are present.  Measles, mumps, and rubella (MMR) vaccine. Adults born  before 1957 generally are considered immune to measles and mumps. Adults born in 1957 or later should have 1 or more doses of MMR vaccine unless there is a contraindication to the vaccine or there is laboratory evidence of immunity to each of the three diseases. A routine second dose of MMR vaccine should be obtained at least 28 days after the first dose for students attending postsecondary schools, health care workers, or international travelers. People who received inactivated measles vaccine or an unknown type of measles vaccine during 1963-1967 should receive 2 doses of MMR vaccine. People who received inactivated mumps vaccine or an unknown type of mumps vaccine before 1979 and are at high risk for mumps infection should consider immunization with 2 doses of MMR vaccine. For females of childbearing age, rubella immunity should be determined. If there is no evidence of immunity, females who are not pregnant should be vaccinated. If there is no evidence of immunity, females who are pregnant should delay immunization until after pregnancy. Unvaccinated health care workers born before 1957 who lack laboratory evidence of measles, mumps, or rubella immunity or laboratory confirmation of disease should consider measles and mumps immunization with 2 doses of MMR vaccine or rubella immunization with 1 dose of MMR vaccine.  Pneumococcal 13-valent conjugate (PCV13) vaccine. When indicated, a person who is uncertain of his immunization history and has no record of immunization should receive the PCV13 vaccine. All adults 65 years of age and older should receive this  vaccine. An adult aged 19 years or older who has certain medical conditions and has not been previously immunized should receive 1 dose of PCV13 vaccine. This PCV13 should be followed with a dose of pneumococcal polysaccharide (PPSV23) vaccine. Adults who are at high risk for pneumococcal disease should obtain the PPSV23 vaccine at least 8 weeks after the dose of PCV13 vaccine. Adults older than 51 years of age who have normal immune system function should obtain the PPSV23 vaccine dose at least 1 year after the dose of PCV13 vaccine.  Pneumococcal polysaccharide (PPSV23) vaccine. When PCV13 is also indicated, PCV13 should be obtained first. All adults aged 65 years and older should be immunized. An adult younger than age 65 years who has certain medical conditions should be immunized. Any person who resides in a nursing home or long-term care facility should be immunized. An adult smoker should be immunized. People with an immunocompromised condition and certain other conditions should receive both PCV13 and PPSV23 vaccines. People with human immunodeficiency virus (HIV) infection should be immunized as soon as possible after diagnosis. Immunization during chemotherapy or radiation therapy should be avoided. Routine use of PPSV23 vaccine is not recommended for American Indians, [place] Natives, or people younger than 65 years unless there are medical conditions that require PPSV23 vaccine. When indicated, people who have unknown immunization and have no record of immunization should receive PPSV23 vaccine. One-time revaccination 5 years after the first dose of PPSV23 is recommended for people aged 19-64 years who have chronic kidney failure, nephrotic syndrome, asplenia, or immunocompromised conditions. People who received 1-2 doses of PPSV23 before age 65 years should receive another dose of PPSV23 vaccine at age 65 years or later if at least 5 years have passed since the previous dose. Doses of PPSV23 are not  needed for people immunized with PPSV23 at or after age 65 years.  Meningococcal vaccine. Adults with asplenia or persistent complement component deficiencies should receive 2 doses of quadrivalent meningococcal conjugate (MenACWY-D) vaccine. The doses should be obtained   at least 2 months apart. Microbiologists working with certain meningococcal bacteria, [place] recruits, people at risk during an outbreak, and people who travel to or live in countries with a high rate of meningitis should be immunized. A first-year college student up through age 34 years who is living in a residence hall should receive a dose if she did not receive a dose on or after her 16th birthday. Adults who have certain high-risk conditions should receive one or more doses of vaccine.  Hepatitis A vaccine. Adults who wish to be protected from this disease, have certain high-risk conditions, work with hepatitis A-infected animals, work in hepatitis A research labs, or travel to or work in countries with a high rate of hepatitis A should be immunized. Adults who were previously unvaccinated and who anticipate close contact with an international adoptee during the first 60 days after arrival in the [place] from a country with a high rate of hepatitis A should be immunized.  Hepatitis B vaccine. Adults who wish to be protected from this disease, have certain high-risk conditions, may be exposed to blood or other infectious body fluids, are household contacts or sex partners of hepatitis B positive people, are clients or workers in certain care facilities, or travel to or work in countries with a high rate of hepatitis B should be immunized.  Haemophilus influenzae type b (Hib) vaccine. A previously unvaccinated person with asplenia or sickle cell disease or having a scheduled splenectomy should receive 1 dose of Hib vaccine. Regardless of previous immunization, a recipient of a hematopoietic stem cell transplant should receive a  3-dose series 6-12 months after her successful transplant. Hib vaccine is not recommended for adults with HIV infection. Preventive Services / Frequency Ages 35 to 4 years  Blood pressure check.** / Every 3-5 years.  Lipid and cholesterol check.** / Every 5 years beginning at age 60.  Clinical breast exam.** / Every 3 years for women in their 71s and 10s.  BRCA-related cancer risk assessment.** / For women who have family members with a BRCA-related cancer (breast, ovarian, tubal, or peritoneal cancers).  Pap test.** / Every 2 years from ages 76 through 26. Every 3 years starting at age 61 through age 76 or 93 with a history of 3 consecutive normal Pap tests.  HPV screening.** / Every 3 years from ages 37 through ages 60 to 51 with a history of 3 consecutive normal Pap tests.  Hepatitis C blood test.** / For any individual with known risks for hepatitis C.  Skin self-exam. / Monthly.  Influenza vaccine. / Every year.  Tetanus, diphtheria, and acellular pertussis (Tdap, Td) vaccine.** / Consult your health care provider. Pregnant women should receive 1 dose of Tdap vaccine during each pregnancy. 1 dose of Td every 10 years.  Varicella vaccine.** / Consult your health care provider. Pregnant females who do not have evidence of immunity should receive the first dose after pregnancy.  HPV vaccine. / 3 doses over 6 months, if 93 and younger. The vaccine is not recommended for use in pregnant females. However, pregnancy testing is not needed before receiving a dose.  Measles, mumps, rubella (MMR) vaccine.** / You need at least 1 dose of MMR if you were born in 1957 or later. You may also need a 2nd dose. For females of childbearing age, rubella immunity should be determined. If there is no evidence of immunity, females who are not pregnant should be vaccinated. If there is no evidence of immunity, females who are  pregnant should delay immunization until after pregnancy.  Pneumococcal  13-valent conjugate (PCV13) vaccine.** / Consult your health care provider.  Pneumococcal polysaccharide (PPSV23) vaccine.** / 1 to 2 doses if you smoke cigarettes or if you have certain conditions.  Meningococcal vaccine.** / 1 dose if you are age 68 to 8 years and a Market researcher living in a residence hall, or have one of several medical conditions, you need to get vaccinated against meningococcal disease. You may also need additional booster doses.  Hepatitis A vaccine.** / Consult your health care provider.  Hepatitis B vaccine.** / Consult your health care provider.  Haemophilus influenzae type b (Hib) vaccine.** / Consult your health care provider. Ages 7 to 53 years  Blood pressure check.** / Every year.  Lipid and cholesterol check.** / Every 5 years beginning at age 25 years.  Lung cancer screening. / Every year if you are aged 11-80 years and have a 30-pack-year history of smoking and currently smoke or have quit within the past 15 years. Yearly screening is stopped once you have quit smoking for at least 15 years or develop a health problem that would prevent you from having lung cancer treatment.  Clinical breast exam.** / Every year after age 48 years.  BRCA-related cancer risk assessment.** / For women who have family members with a BRCA-related cancer (breast, ovarian, tubal, or peritoneal cancers).  Mammogram.** / Every year beginning at age 41 years and continuing for as long as you are in good health. Consult with your health care provider.  Pap test.** / Every 3 years starting at age 65 years through age 37 or 70 years with a history of 3 consecutive normal Pap tests.  HPV screening.** / Every 3 years from ages 72 years through ages 60 to 40 years with a history of 3 consecutive normal Pap tests.  Fecal occult blood test (FOBT) of stool. / Every year beginning at age 21 years and continuing until age 5 years. You may not need to do this test if you get  a colonoscopy every 10 years.  Flexible sigmoidoscopy or colonoscopy.** / Every 5 years for a flexible sigmoidoscopy or every 10 years for a colonoscopy beginning at age 35 years and continuing until age 48 years.  Hepatitis C blood test.** / For all people born from [birth year] through 1965 and any individual with known risks for hepatitis C.  Skin self-exam. / Monthly.  Influenza vaccine. / Every year.  Tetanus, diphtheria, and acellular pertussis (Tdap/Td) vaccine.** / Consult your health care provider. Pregnant women should receive 1 dose of Tdap vaccine during each pregnancy. 1 dose of Td every 10 years.  Varicella vaccine.** / Consult your health care provider. Pregnant females who do not have evidence of immunity should receive the first dose after pregnancy.  Zoster vaccine.** / 1 dose for adults aged 30 years or older.  Measles, mumps, rubella (MMR) vaccine.** / You need at least 1 dose of MMR if you were born in 1957 or later. You may also need a second dose. For females of childbearing age, rubella immunity should be determined. If there is no evidence of immunity, females who are not pregnant should be vaccinated. If there is no evidence of immunity, females who are pregnant should delay immunization until after pregnancy.  Pneumococcal 13-valent conjugate (PCV13) vaccine.** / Consult your health care provider.  Pneumococcal polysaccharide (PPSV23) vaccine.** / 1 to 2 doses if you smoke cigarettes or if you have certain conditions.  Meningococcal vaccine.** /  Consult your health care provider.  Hepatitis A vaccine.** / Consult your health care provider.  Hepatitis B vaccine.** / Consult your health care provider.  Haemophilus influenzae type b (Hib) vaccine.** / Consult your health care provider. Ages 64 years and over  Blood pressure check.** / Every year.  Lipid and cholesterol check.** / Every 5 years beginning at age 23 years.  Lung cancer screening. / Every year if you  are aged 16-80 years and have a 30-pack-year history of smoking and currently smoke or have quit within the past 15 years. Yearly screening is stopped once you have quit smoking for at least 15 years or develop a health problem that would prevent you from having lung cancer treatment.  Clinical breast exam.** / Every year after age 74 years.  BRCA-related cancer risk assessment.** / For women who have family members with a BRCA-related cancer (breast, ovarian, tubal, or peritoneal cancers).  Mammogram.** / Every year beginning at age 44 years and continuing for as long as you are in good health. Consult with your health care provider.  Pap test.** / Every 3 years starting at age 58 years through age 22 or 39 years with 3 consecutive normal Pap tests. Testing can be stopped between 65 and 70 years with 3 consecutive normal Pap tests and no abnormal Pap or HPV tests in the past 10 years.  HPV screening.** / Every 3 years from ages 64 years through ages 70 or 61 years with a history of 3 consecutive normal Pap tests. Testing can be stopped between 65 and 70 years with 3 consecutive normal Pap tests and no abnormal Pap or HPV tests in the past 10 years.  Fecal occult blood test (FOBT) of stool. / Every year beginning at age 40 years and continuing until age 27 years. You may not need to do this test if you get a colonoscopy every 10 years.  Flexible sigmoidoscopy or colonoscopy.** / Every 5 years for a flexible sigmoidoscopy or every 10 years for a colonoscopy beginning at age 7 years and continuing until age 32 years.  Hepatitis C blood test.** / For all people born from [birth year] through 1966 and any individual with known risks for hepatitis C.  Osteoporosis screening.** / A one-time screening for women ages 30 years and over and women at risk for fractures or osteoporosis.  Skin self-exam. / Monthly.  Influenza vaccine. / Every year.  Tetanus, diphtheria, and acellular pertussis (Tdap/Td)  vaccine.** / 1 dose of Td every 10 years.  Varicella vaccine.** / Consult your health care provider.  Zoster vaccine.** / 1 dose for adults aged 35 years or older.  Pneumococcal 13-valent conjugate (PCV13) vaccine.** / Consult your health care provider.  Pneumococcal polysaccharide (PPSV23) vaccine.** / 1 dose for all adults aged 46 years and older.  Meningococcal vaccine.** / Consult your health care provider.  Hepatitis A vaccine.** / Consult your health care provider.  Hepatitis B vaccine.** / Consult your health care provider.  Haemophilus influenzae type b (Hib) vaccine.** / Consult your health care provider. ** Family history and personal history of risk and conditions may change your health care provider's recommendations.   This information is not intended to replace advice given to you by your health care provider. Make sure you discuss any questions you have with your health care provider.   Document Released: 04/29/2001 Document Revised: 03/24/2014 Document Reviewed: 07/29/2010 Elsevier Interactive Patient Education [employer].

## 2015-03-23 NOTE — Progress Notes (Signed)
Subjective:     Deborah Watts is a 50 y.o. female and is here for a comprehensive physical exam. The patient reports no problems.  Social History   Social History  . Marital Status: Married    Spouse Name: N/A  . Number of Children: N/A  . Years of Education: N/A   Occupational History  . [employer]    Social History Main Topics  . Smoking status: Never Smoker   . Smokeless tobacco: Never Used  . Alcohol Use: 0.0 oz/week    0 Standard drinks or equivalent per week     Comment: occassional  . Drug Use: No  . Sexual Activity:    Partners: Male   Other Topics Concern  . Not on file   Social History Narrative   Exercise-- no   Health Maintenance  Topic Date Due  . COLONOSCOPY  06/28/2014  . MAMMOGRAM  01/13/2015  . HIV Screening  09/29/2015 (Originally 06/28/1979)  . INFLUENZA VACCINE  11/17/2015 (Originally 10/16/2014)  . PAP SMEAR  01/07/2016  . 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 She  does not have any pertinent problems on file. She  has no past surgical history on file. Her family history includes Throat cancer in her father. She  reports that she has never smoked. She has never used smokeless tobacco. She reports that she drinks alcohol. She reports that she does not use illicit drugs. She has a current medication list which includes the following prescription(s): albuterol sulfate, alprazolam, celecoxib, clarithromycin, fluticasone, meclizine hcl, mometasone-formoterol, montelukast, prednisone, sumatriptan, and topiramate. Current Outpatient Prescriptions on File Prior to Visit  Medication Sig Dispense Refill  . Albuterol Sulfate (PROAIR RESPICLICK) 222 (90 BASE) MCG/ACT AEPB Inhale 2 puffs into the lungs every 6 (six) hours as needed. 1 each 2  . clarithromycin (BIAXIN  XL) 500 MG 24 hr tablet Take 2 tablets (1,000 mg total) by mouth daily. 28 tablet 0  . fluticasone (FLONASE) 50 MCG/ACT nasal spray Place 2 sprays into both nostrils daily. 16 g 6  . Meclizine HCl (TRAVEL SICKNESS) 25 MG CHEW CHEW 1 TABLET EVERY 6 HOURS AS NEEDED 45 each 2  . mometasone-formoterol (DULERA) 200-5 MCG/ACT AERO Inhale 2 puffs into the lungs 2 (two) times daily. 1 Inhaler 5  . predniSONE (DELTASONE) 10 MG tablet 3 po qd for 3 days then 2 po qd for 3 days the 1 po qd for 3 days 18 tablet 0   No current facility-administered medications on file prior to visit.   She is allergic to fish allergy; penicillins; iohexol; and ivp dye..  Review of Systems Review of Systems  Constitutional: Negative for activity change, appetite change and fatigue.  HENT: Negative for hearing loss, congestion, tinnitus and ear discharge.  dentist [id number]: Negative for visual disturbance (see optho q1y -- vision corrected to 20/20 with glasses).  Respiratory: Negative for cough, chest tightness and shortness of breath.   Cardiovascular: Negative for chest pain, palpitations and leg swelling.  Gastrointestinal: Negative for abdominal pain, diarrhea, constipation and abdominal distention.  Genitourinary: Negative for urgency, frequency, decreased urine volume and difficulty urinating.  Musculoskeletal: Negative for back pain, arthralgias and gait problem.  Skin: Negative for color change, pallor and rash.  Neurological: Negative for dizziness, light-headedness, numbness and headaches.  Hematological: Negative for adenopathy. Does not bruise/bleed  easily.  Psychiatric/Behavioral: Negative for suicidal ideas, confusion, sleep disturbance, self-injury, dysphoric mood, decreased concentration and agitation.       Objective:    BP 116/72 mmHg  Pulse 70  Temp(Src) 97.8 F (36.6 C) (Oral)  Ht '5\' 1"'  (1.549 m)  Wt 175 lb 9.6 oz (79.652 kg)  BMI 33.20 kg/m2  SpO2 98% General appearance: alert,  cooperative, appears stated age and no distress Head: Normocephalic, without obvious abnormality, atraumatic Eyes: conjunctivae/corneas clear. PERRL, EOM's intact. Fundi benign. Ears: normal TM's and external ear canals both ears Nose: Nares normal. Septum midline. Mucosa normal. No drainage or sinus tenderness. Throat: lips, mucosa, and tongue normal; teeth and gums normal Neck: no adenopathy, no carotid bruit, no JVD, supple, symmetrical, trachea midline and thyroid not enlarged, symmetric, no tenderness/mass/nodules Back: symmetric, no curvature. ROM normal. No CVA tenderness. Lungs: clear to auscultation bilaterally Breasts: normal appearance, no masses or tenderness, - Heart: regular rate and rhythm, S1, S2 normal, no murmur, click, rub or gallop Abdomen: soft, non-tender; bowel sounds normal; no masses,  no organomegaly Pelvic: deferred Extremities: extremities normal, atraumatic, no cyanosis or edema Pulses: 2+ and symmetric Skin: Skin color, texture, turgor normal. No rashes or lesions Lymph nodes: Cervical, supraclavicular, and axillary nodes normal. Neurologic: Alert and oriented X 3, normal strength and tone. Normal symmetric reflexes. Normal coordination and gait  Psych- no depression, no anxiety      Assessment:    Healthy female exam.      Plan:    ghm utd Check labs See After Visit Summary for Counseling Recommendations   1. Visit for screening mammogram  - MM Digital Screening; Future  2. Asthma, chronic, unspecified asthma severity, uncomplicated No new, stable - montelukast (SINGULAIR) 10 MG tablet; Take 1 tablet (10 mg total) by mouth at bedtime.  Dispense: 90 tablet; Refill: 3  3. Hx of migraines   - topiramate (TOPAMAX) 50 MG tablet; Take 1 tablet (50 mg total) by mouth 2 (two) times daily.  Dispense: 180 tablet; Refill: 3 - SUMAtriptan (IMITREX) 100 MG tablet; TAKE 1 TABLET BY MOUTH AS NEEDED FOR MIGRAINE AND REPEAT IN 2 HOURS AS NEEDED  Dispense: 12  tablet; Refill: 1  4. Preventative health care   - Ambulatory referral to Gastroenterology - Comp Met (CMET) - CBC with Differential/Platelet - Lipid panel - POCT urinalysis dipstick - TSH - HIV antibody; Future  5. Arthritis   - celecoxib (CELEBREX) 200 MG capsule; TAKE 1 CAPSULE TWICE DAILY AS NEEDED  Dispense: 180 capsule; Refill: 1  6. Generalized anxiety disorder sstable-- refill meds - ALPRAZolam (XANAX) 0.5 MG tablet; TAKE ONE TABLET BY MOUTH THREE TIMES DAILY FOR SLEEP OR ANXIETY  Dispense: 90 tablet; Refill: 1  7. Asthma with acute exacerbation, mild intermittent    8. Encounter for screening for HIV

## 2015-03-27 LAB — POCT URINALYSIS DIPSTICK
Bilirubin, UA: NEGATIVE
Blood, UA: NEGATIVE
GLUCOSE UA: NEGATIVE
KETONES UA: NEGATIVE
LEUKOCYTES UA: NEGATIVE
Nitrite, UA: NEGATIVE
PROTEIN UA: NEGATIVE
SPEC GRAV UA: 1.02
Urobilinogen, UA: 0.2
pH, UA: 6

## 2015-03-29 ENCOUNTER — Encounter (HOSPITAL_BASED_OUTPATIENT_CLINIC_OR_DEPARTMENT_OTHER): Payer: Self-pay | Admitting: *Deleted

## 2015-03-29 ENCOUNTER — Emergency Department (HOSPITAL_BASED_OUTPATIENT_CLINIC_OR_DEPARTMENT_OTHER)
Admission: EM | Admit: 2015-03-29 | Discharge: 2015-03-29 | Disposition: A | Discharge status: Home / Self Care | Payer: BLUE CROSS/BLUE SHIELD | Attending: Emergency Medicine | Admitting: Emergency Medicine

## 2015-03-29 ENCOUNTER — Telehealth: Payer: Self-pay | Admitting: Family Medicine

## 2015-03-29 DIAGNOSIS — Z880 Allergy status to penicillin: Secondary | ICD-10-CM | POA: Diagnosis not present

## 2015-03-29 DIAGNOSIS — I10 Essential (primary) hypertension: Secondary | ICD-10-CM | POA: Diagnosis not present

## 2015-03-29 DIAGNOSIS — R109 Unspecified abdominal pain: Secondary | ICD-10-CM | POA: Insufficient documentation

## 2015-03-29 DIAGNOSIS — Z862 Personal history of diseases of the blood and blood-forming organs and certain disorders involving the immune mechanism: Secondary | ICD-10-CM | POA: Diagnosis not present

## 2015-03-29 DIAGNOSIS — Z7951 Long term (current) use of inhaled steroids: Secondary | ICD-10-CM | POA: Diagnosis not present

## 2015-03-29 DIAGNOSIS — R079 Chest pain, unspecified: Secondary | ICD-10-CM | POA: Diagnosis present

## 2015-03-29 DIAGNOSIS — F419 Anxiety disorder, unspecified: Secondary | ICD-10-CM | POA: Diagnosis not present

## 2015-03-29 DIAGNOSIS — Z79899 Other long term (current) drug therapy: Secondary | ICD-10-CM | POA: Diagnosis not present

## 2015-03-29 DIAGNOSIS — J45909 Unspecified asthma, uncomplicated: Secondary | ICD-10-CM | POA: Diagnosis not present

## 2015-03-29 LAB — COMPREHENSIVE METABOLIC PANEL
ALT: 13 U/L — ABNORMAL LOW (ref 14–54)
AST: 13 U/L — ABNORMAL LOW (ref 15–41)
Albumin: 4.1 g/dL (ref 3.5–5.0)
Alkaline Phosphatase: 50 U/L (ref 38–126)
Anion gap: 12 (ref 5–15)
BUN: 14 mg/dL (ref 6–20)
CHLORIDE: 112 mmol/L — AB (ref 101–111)
CO2: 23 mmol/L (ref 22–32)
Calcium: 9.1 mg/dL (ref 8.9–10.3)
Creatinine, Ser: 0.71 mg/dL (ref 0.44–1.00)
GFR calc non Af Amer: >60 mL/min (ref >60)
GLUCOSE: 88 mg/dL (ref 65–99)
POTASSIUM: 3.9 mmol/L (ref 3.5–5.1)
SODIUM: 147 mmol/L — AB (ref 135–145)
Total Bilirubin: 0.3 mg/dL (ref 0.3–1.2)
Total Protein: 7 g/dL (ref 6.5–8.1)

## 2015-03-29 LAB — CBC WITH DIFFERENTIAL/PLATELET
BASOS PCT: 0 %
Basophils Absolute: 0 10*3/uL (ref 0.0–0.1)
EOS ABS: 0 10*3/uL (ref 0.0–0.7)
Eosinophils Relative: 0 %
HCT: 41.8 % (ref 36.0–46.0)
HEMOGLOBIN: 13.6 g/dL (ref 12.0–15.0)
Lymphocytes Relative: 19 %
Lymphs Abs: 2.1 10*3/uL (ref 0.7–4.0)
MCH: 27.8 pg (ref 26.0–34.0)
MCHC: 32.5 g/dL (ref 30.0–36.0)
MCV: 85.5 fL (ref 78.0–100.0)
MONO ABS: 0.6 10*3/uL (ref 0.1–1.0)
MONOS PCT: 5 %
NEUTROS PCT: 76 %
Neutro Abs: 8.5 10*3/uL — ABNORMAL HIGH (ref 1.7–7.7)
Platelets: 310 10*3/uL (ref 150–400)
RBC: 4.89 MIL/uL (ref 3.87–5.11)
RDW: 14.3 % (ref 11.5–15.5)
WBC: 11.2 10*3/uL — ABNORMAL HIGH (ref 4.0–10.5)

## 2015-03-29 LAB — LIPASE, BLOOD: LIPASE: 17 U/L (ref 11–51)

## 2015-03-29 LAB — TROPONIN I

## 2015-03-29 MED ORDER — ONDANSETRON HCL 4 MG/2ML IJ SOLN
4.0000 mg | Freq: Once | INTRAMUSCULAR | Status: AC
  Administered 2015-03-29: 4 mg via INTRAVENOUS
  Filled 2015-03-29: qty 2

## 2015-03-29 MED ORDER — FENTANYL CITRATE (PF) 100 MCG/2ML IJ SOLN
50.0000 ug | Freq: Once | INTRAMUSCULAR | Status: AC
  Administered 2015-03-29: 50 ug via INTRAVENOUS
  Filled 2015-03-29: qty 2

## 2015-03-29 MED ORDER — SUCRALFATE 1 G PO TABS: 1.0000 g | ORAL_TABLET | Freq: Two times a day (BID) | ORAL | Status: DC

## 2015-03-29 MED ORDER — FENTANYL CITRATE (PF) 100 MCG/2ML IJ SOLN
25.0000 ug | Freq: Once | INTRAMUSCULAR | Status: AC
  Administered 2015-03-29: 25 ug via INTRAVENOUS
  Filled 2015-03-29: qty 2

## 2015-03-29 NOTE — Telephone Encounter (Signed)
Patient Name: Deborah Watts DOB: 1965-05-01 Initial Comment Caller states having heartburn, nothing is helping it. She was having some difficulty breathing, took a Albuterul treatment, and now her heart is beating faster. Nurse Assessment Nurse: Yetta BarreJones, RN, Miranda Date/Time (Eastern Time): 03/29/2015 2:12:12 PM Confirm and document reason for call. If symptomatic, describe symptoms. ---Caller states she was having "gurgling" in her stomach since yesterday. She took Zantac and [place]X. She has pain in her upper left abdomen. She has also taking an Albuterol treatment and her heart is racing and she feels jittery. Has the patient traveled out of the country within the last 30 days? ---Not Applicable Does the patient have any new or worsening symptoms? ---Yes Will a triage be completed? ---Yes Related visit to physician within the last 2 weeks? ---No Does the PT have any chronic conditions? (i.e. diabetes, asthma, etc.) ---Yes List chronic conditions. ---Asthma, Anxiety Did the patient indicate they were pregnant? ---No Is this a behavioral health or substance abuse call? ---No Guidelines Guideline Title Affirmed Question Affirmed Notes Abdominal Pain - Upper [1] Pain lasts > 10 minutes AND [2] age > 7350 Final Disposition User Go to ED Now Yetta BarreJones, RN, Miranda Comments Caller is not sure if she will go to the ED or not Referrals GO TO FACILITY UNDECIDED Disagree/Comply: Comply

## 2015-03-29 NOTE — ED Provider Notes (Signed)
CSN: [id number]     Arrival date & time 03/29/15  1519 History   First MD Initiated Contact with Patient 03/29/15 1531     Chief Complaint  Patient presents with  . Chest Pain      HPI  Expand All Collapse All   Pain in her chest and radiating to her upper back since yesterday. States she took Zantac last night for a gurgling in her stomach.        Past Medical History  Diagnosis Date  . Asthma   . Anxiety   . MHA (microangiopathic hemolytic anemia) ([facility])   . Hypertension    History reviewed. No pertinent past surgical history. Family History  Problem Relation Age of Onset  . Asthma    . Diabetes    . Hyperlipidemia    . Throat cancer Father    Social History  Substance Use Topics  . Smoking status: Never Smoker   . Smokeless tobacco: Never Used  . Alcohol Use: 0.0 oz/week    0 Standard drinks or equivalent per week     Comment: occassional   OB History    No data available     Review of Systems All other systems reviewed and are negative  Allergies  Fish allergy; Penicillins; Iohexol; and Ivp dye  Home Medications   Prior to Admission medications   Medication Sig Start Date End Date Taking? Authorizing Provider  Albuterol Sulfate (PROAIR RESPICLICK) 108 (90 BASE) MCG/ACT AEPB Inhale 2 puffs into the lungs every 6 (six) hours as needed. 04/25/14   Lelon Perla, DO  ALPRAZolam Prudy Feeler) 0.5 MG tablet TAKE ONE TABLET BY MOUTH THREE TIMES DAILY FOR SLEEP OR ANXIETY 03/23/15   Lelon Perla, DO  celecoxib (CELEBREX) 200 MG capsule TAKE 1 CAPSULE TWICE DAILY AS NEEDED 03/23/15   Lelon Perla, DO  fluticasone (FLONASE) 50 MCG/ACT nasal spray Place 2 sprays into both nostrils daily. 03/16/15   Lelon Perla, DO  Meclizine HCl (TRAVEL SICKNESS) 25 MG CHEW CHEW 1 TABLET EVERY 6 HOURS AS NEEDED 12/22/14   Lelon Perla, DO  mometasone-formoterol (DULERA) 200-5 MCG/ACT AERO Inhale 2 puffs into the lungs 2 (two) times daily. 01/29/15   Grayling Congress Lowne, DO  montelukast  (SINGULAIR) 10 MG tablet Take 1 tablet (10 mg total) by mouth at bedtime. 03/23/15   Lelon Perla, DO  predniSONE (DELTASONE) 10 MG tablet 3 po qd for 3 days then 2 po qd for 3 days the 1 po qd for 3 days 03/16/15   Lelon Perla, DO  sucralfate (CARAFATE) 1 g tablet Take 1 tablet (1 g total) by mouth 2 (two) times daily. 03/29/15   Nelva Nay, MD  SUMAtriptan (IMITREX) 100 MG tablet TAKE 1 TABLET BY MOUTH AS NEEDED FOR MIGRAINE AND REPEAT IN 2 HOURS AS NEEDED 03/23/15   Lelon Perla, DO  topiramate (TOPAMAX) 50 MG tablet Take 1 tablet (50 mg total) by mouth 2 (two) times daily. 03/23/15   Grayling Congress Lowne, DO   BP 110/79 mmHg  Pulse 79  Temp(Src) 98.2 F (36.8 C) (Oral)  Resp 20  Ht 4\' 11"  (1.499 m)  Wt 175 lb (79.379 kg)  BMI 35.33 kg/m2  SpO2 100% Physical Exam Physical Exam  Nursing note and vitals reviewed. Constitutional: She is oriented to person, place, and time. She appears well-developed and well-nourished. No distress.  HENT:  Head: Normocephalic and atraumatic.  Eyes: Pupils are equal, round, and reactive to light.  Neck: Normal range of motion.  Cardiovascular: Normal rate and intact distal pulses.   Pulmonary/Chest: No respiratory distress.  Abdominal: Normal appearance. She exhibits no distension.  Musculoskeletal: Normal range of motion.  Neurological: She is alert and oriented to person, place, and time. No cranial nerve deficit.  Skin: Skin is warm and dry. No rash noted.    ED Course  Procedures (including critical care time) Labs Review Labs Reviewed  CBC WITH DIFFERENTIAL/PLATELET - Abnormal; Notable for the following:    WBC 11.2 (*)    Neutro Abs 8.5 (*)    All other components within normal limits  COMPREHENSIVE METABOLIC PANEL - Abnormal; Notable for the following:    Sodium 147 (*)    Chloride 112 (*)    AST 13 (*)    ALT 13 (*)    All other components within normal limits  TROPONIN I  LIPASE, BLOOD  TROPONIN I  TROPONIN I    Imaging  Review No results found. I have personally reviewed and evaluated these images and lab results as part of my medical decision-making.   EKG Interpretation   Date/Time:  Thursday March 29 2015 15:27:51 EST Ventricular Rate:  95 PR Interval:  180 QRS Duration: 82 QT Interval:  360 QTC Calculation: 452 R Axis:   71 Text Interpretation:  Normal sinus rhythm Normal ECG Confirmed by Chayne Baumgart   MD, Keshaun Dubey ([id number]) on 03/29/2015 3:52:13 PM     After treatment in the ED the patient feels back to baseline and wants to go home. MDM   Final diagnoses:  Abdominal pain, unspecified abdominal location        Nelva Nayobert Johney Perotti, MD 03/29/15 1952

## 2015-03-29 NOTE — Telephone Encounter (Signed)
Per chart[facility]

## 2015-03-29 NOTE — ED Notes (Signed)
Pain in her chest and across her upper back since yesterday. States she took Zantac last night for a gurgling in her stomach.

## 2015-03-29 NOTE — ED Notes (Signed)
Waiting med-time after receiving fentanyl 30 min

## 2015-03-29 NOTE — Telephone Encounter (Signed)
Pt called stating she felt like she had heartburn so she went and got zantac. She then felt like it could be gas so took gas x and drinking soda. She felt like she was short of breath so she took an albuterol treatment. Not pt heart is racing/jittery and not sure if she is having a panic attack or something else. Transferred to [employer] with [employer].

## 2015-03-29 NOTE — Discharge Instructions (Signed)
Pain Without a Known Cause °WHAT IS PAIN WITHOUT A KNOWN CAUSE? °Pain can occur in any part of the body and can range from mild to severe. Sometimes no cause can be found for why you are having pain. Some types of pain that can occur without a known cause include:  °· Headache. °· Back pain. °· Abdominal pain. °· Neck pain. °HOW IS PAIN WITHOUT A KNOWN CAUSE DIAGNOSED?  °Your health care provider will try to find the cause of your pain. This may include: °· Physical exam. °· Medical history. °· Blood tests. °· Urine tests. °· X-rays. °If no cause is found, your health care provider may diagnose you with pain without a known cause.  °IS THERE TREATMENT FOR PAIN WITHOUT A CAUSE?  °Treatment depends on the kind of pain you have. Your health care provider may prescribe medicines to help relieve your pain.  °WHAT CAN I DO AT HOME FOR MY PAIN?  °· Take medicines only as directed by your health care provider. °· Stop any activities that cause pain. During periods of severe pain, bed rest may help. °· Try to reduce your stress with activities such as yoga or meditation. Talk to your health care provider for other stress-reducing activity recommendations. °· Exercise regularly, if approved by your health care provider. °· Eat a healthy diet that includes fruits and vegetables. This may improve pain. Talk to your health care provider if you have any questions about your diet. °WHAT IF MY PAIN DOES NOT GET BETTER?  °If you have a painful condition and no reason can be found for the pain or the pain gets worse, it is important to follow up with your health care provider. It may be necessary to repeat tests and look further for a possible cause.  °  °This information is not intended to replace advice given to you by your health care provider. Make sure you discuss any questions you have with your health care provider. °  °Document Released: 11/26/2000 Document Revised: 03/24/2014 Document Reviewed: 07/19/2013 °Elsevier  Interactive Patient Education ©2016 [employer] ° °

## 2015-06-22 ENCOUNTER — Telehealth: Payer: Self-pay

## 2015-06-22 DIAGNOSIS — F411 Generalized anxiety disorder: Secondary | ICD-10-CM

## 2015-06-22 MED ORDER — ALPRAZOLAM 0.5 MG PO TABS: ORAL_TABLET | ORAL | Status: DC

## 2015-06-22 NOTE — Telephone Encounter (Signed)
Rx faxed.    KP 

## 2015-06-22 NOTE — Telephone Encounter (Signed)
Refill x1   2 refills 

## 2015-06-22 NOTE — Telephone Encounter (Signed)
Xanax Last seen 03/23/15 #90 with 1   Please advise     KP

## 2015-07-23 ENCOUNTER — Telehealth: Payer: Self-pay | Admitting: *Deleted

## 2015-07-23 ENCOUNTER — Encounter: Payer: Self-pay | Admitting: Family Medicine

## 2015-07-23 ENCOUNTER — Ambulatory Visit (INDEPENDENT_AMBULATORY_CARE_PROVIDER_SITE_OTHER): Payer: BLUE CROSS/BLUE SHIELD | Admitting: Family Medicine

## 2015-07-23 VITALS — BP 107/63 | HR 84 | Temp 98.2°F | Wt 177.6 lb

## 2015-07-23 DIAGNOSIS — R300 Dysuria: Secondary | ICD-10-CM

## 2015-07-23 DIAGNOSIS — H8110 Benign paroxysmal vertigo, unspecified ear: Secondary | ICD-10-CM | POA: Diagnosis not present

## 2015-07-23 LAB — POC URINALSYSI DIPSTICK (AUTOMATED)
BILIRUBIN UA: NEGATIVE
Glucose, UA: NEGATIVE
Ketones, UA: NEGATIVE
Leukocytes, UA: NEGATIVE
NITRITE UA: NEGATIVE
PH UA: 6
Protein, UA: NEGATIVE
RBC UA: NEGATIVE
Spec Grav, UA: 1.025
Urobilinogen, UA: NEGATIVE

## 2015-07-23 MED ORDER — PHENAZOPYRIDINE HCL 200 MG PO TABS: 200.0000 mg | ORAL_TABLET | Freq: Three times a day (TID) | ORAL | Status: DC | PRN

## 2015-07-23 MED ORDER — CIPROFLOXACIN HCL 250 MG PO TABS: 250.0000 mg | ORAL_TABLET | Freq: Two times a day (BID) | ORAL | Status: DC

## 2015-07-23 MED ORDER — MECLIZINE HCL 25 MG PO CHEW: CHEWABLE_TABLET | ORAL | Status: DC

## 2015-07-23 NOTE — Progress Notes (Signed)
Pre visit review using our clinic review tool, if applicable. No additional management support is needed unless otherwise documented below in the visit note. 

## 2015-07-23 NOTE — Patient Instructions (Signed)

## 2015-07-23 NOTE — Telephone Encounter (Signed)
Received request for PA on Celecoxib, Initiated via Cover My Meds; Approval received valid 06/23/15-07/22/16, forwarded to pharmacy/SLS 05/08

## 2015-07-23 NOTE — Progress Notes (Signed)
Patient ID: Vista Lawmanmanda S Marolf, female    DOB: 03-Jun-1965  Age: 50 y.o. MRN: [id number]    Subjective:  Subjective HPI Vista Lawmanmanda S Guimond presents for back pain and dysuria since Friday.  There was also blood in the urine.  She has been drinking a lot of water since.  Review of Systems  Constitutional: Negative for diaphoresis, appetite change, fatigue and unexpected weight change.  Eyes: Negative for pain, redness and visual disturbance.  Respiratory: Negative for cough, chest tightness, shortness of breath and wheezing.   Cardiovascular: Negative for chest pain, palpitations and leg swelling.  Endocrine: Negative for cold intolerance, heat intolerance, polydipsia, polyphagia and polyuria.  Genitourinary: Negative for dysuria, frequency and difficulty urinating.  Neurological: Negative for dizziness, light-headedness, numbness and headaches.    History Past Medical History  Diagnosis Date  . Asthma   . Anxiety   . MHA (microangiopathic hemolytic anemia) (HCC)   . Hypertension     She has no past surgical history on file.   Her family history includes Throat cancer in her father.She reports that she has never smoked. She has never used smokeless tobacco. She reports that she drinks alcohol. She reports that she does not use illicit drugs.  Current Outpatient Prescriptions on File Prior to Visit  Medication Sig Dispense Refill  . Albuterol Sulfate (PROAIR RESPICLICK) 108 (90 BASE) MCG/ACT AEPB Inhale 2 puffs into the lungs every 6 (six) hours as needed. 1 each 2  . ALPRAZolam (XANAX) 0.5 MG tablet TAKE ONE TABLET BY MOUTH THREE TIMES DAILY FOR SLEEP OR ANXIETY 90 tablet 2  . celecoxib (CELEBREX) 200 MG capsule TAKE 1 CAPSULE TWICE DAILY AS NEEDED 180 capsule 1  . fluticasone (FLONASE) 50 MCG/ACT nasal spray Place 2 sprays into both nostrils daily. 16 g 6  . mometasone-formoterol (DULERA) 200-5 MCG/ACT AERO Inhale 2 puffs into the lungs 2 (two) times daily. 1 Inhaler 5  . montelukast  (SINGULAIR) 10 MG tablet Take 1 tablet (10 mg total) by mouth at bedtime. 90 tablet 3  . sucralfate (CARAFATE) 1 g tablet Take 1 tablet (1 g total) by mouth 2 (two) times daily. 14 tablet 0  . SUMAtriptan (IMITREX) 100 MG tablet TAKE 1 TABLET BY MOUTH AS NEEDED FOR MIGRAINE AND REPEAT IN 2 HOURS AS NEEDED 12 tablet 1  . topiramate (TOPAMAX) 50 MG tablet Take 1 tablet (50 mg total) by mouth 2 (two) times daily. 180 tablet 3   No current facility-administered medications on file prior to visit.     Objective:  Objective Physical Exam  Constitutional: She is oriented to person, place, and time. She appears well-developed and well-nourished.  HENT:  Head: Normocephalic and atraumatic.  Eyes: Conjunctivae and EOM are normal.  Neck: Normal range of motion. Neck supple. No JVD present. Carotid bruit is not present. No thyromegaly present.  Cardiovascular: Normal rate, regular rhythm and normal heart sounds.   No murmur heard. Pulmonary/Chest: Effort normal and breath sounds normal. No respiratory distress. She has no wheezes. She has no rales. She exhibits no tenderness.  Abdominal: She exhibits no mass. There is tenderness. There is no rebound and no guarding.  Musculoskeletal: She exhibits no edema.  Neurological: She is alert and oriented to person, place, and time.  Psychiatric: She has a normal mood and affect.  Nursing note and vitals reviewed.  BP 107/63 mmHg  Pulse 84  Temp(Src) 98.2 F (36.8 C) (Oral)  Wt 177 lb 9.6 oz (80.559 kg)  SpO2 100% Wt Readings from  Last 3 Encounters:  07/23/15 177 lb 9.6 oz (80.559 kg)  03/29/15 175 lb (79.379 kg)  03/23/15 175 lb 9.6 oz (79.652 kg)     Lab Results  Component Value Date   WBC 11.2* 03/29/2015   HGB 13.6 03/29/2015   HCT 41.8 03/29/2015   PLT 310 03/29/2015   GLUCOSE 88 03/29/2015   CHOL 187 03/23/2015   TRIG 199.0* 03/23/2015   HDL 51.30 03/23/2015   LDLDIRECT 107.1 03/20/2014   LDLCALC 96 03/23/2015   ALT 13* 03/29/2015     AST 13* 03/29/2015   NA 147* 03/29/2015   K 3.9 03/29/2015   CL 112* 03/29/2015   CREATININE 0.71 03/29/2015   BUN 14 03/29/2015   CO2 23 03/29/2015   TSH 1.00 03/23/2015    No results found.   Assessment & Plan:  Plan I have discontinued Ms. Knippel's predniSONE. I am also having her start on ciprofloxacin and phenazopyridine. Additionally, I am having her maintain her Albuterol Sulfate, mometasone-formoterol, fluticasone, celecoxib, montelukast, topiramate, SUMAtriptan, sucralfate, ALPRAZolam, and Meclizine HCl.  Meds ordered this encounter  Medications  . Meclizine HCl (TRAVEL SICKNESS) 25 MG CHEW    Sig: CHEW 1 TABLET EVERY 6 HOURS AS NEEDED    Dispense:  45 each    Refill:  2  . ciprofloxacin (CIPRO) 250 MG tablet    Sig: Take 1 tablet (250 mg total) by mouth 2 (two) times daily.    Dispense:  6 tablet    Refill:  0  . phenazopyridine (PYRIDIUM) 200 MG tablet    Sig: Take 1 tablet (200 mg total) by mouth 3 (three) times daily as needed for pain.    Dispense:  6 tablet    Refill:  0    Problem List Items Addressed This Visit    None    Visit Diagnoses    Dysuria    -  Primary    Relevant Medications    ciprofloxacin (CIPRO) 250 MG tablet    phenazopyridine (PYRIDIUM) 200 MG tablet    Other Relevant Orders    POCT Urinalysis Dipstick (Automated) (Completed)    Urine Culture    Benign paroxysmal positional vertigo, unspecified laterality        Relevant Medications    Meclizine HCl (TRAVEL SICKNESS) 25 MG CHEW    Other Relevant Orders    Urine Culture       Follow-up: Return if symptoms worsen or fail to improve.  Donato Schultz, DO

## 2015-07-24 LAB — URINE CULTURE
Colony Count: NO GROWTH
ORGANISM ID, BACTERIA: NO GROWTH

## 2015-10-17 ENCOUNTER — Other Ambulatory Visit: Payer: Self-pay

## 2015-10-17 ENCOUNTER — Encounter: Payer: Self-pay | Admitting: Family Medicine

## 2015-10-17 DIAGNOSIS — M1990 Unspecified osteoarthritis, unspecified site: Secondary | ICD-10-CM

## 2015-10-17 DIAGNOSIS — F411 Generalized anxiety disorder: Secondary | ICD-10-CM

## 2015-10-17 MED ORDER — CELECOXIB 200 MG PO CAPS: ORAL_CAPSULE | ORAL | 1 refills | Status: DC

## 2015-10-17 NOTE — Telephone Encounter (Signed)
Last seen 07/23/15 and filled 06/22/15 #90 with 2 refills    Please advise    KP

## 2015-10-18 MED ORDER — ALPRAZOLAM 0.5 MG PO TABS: ORAL_TABLET | ORAL | 2 refills | Status: DC

## 2015-12-11 ENCOUNTER — Encounter: Payer: Self-pay | Admitting: Family Medicine

## 2016-01-02 ENCOUNTER — Other Ambulatory Visit: Payer: Self-pay

## 2016-01-02 DIAGNOSIS — Z8669 Personal history of other diseases of the nervous system and sense organs: Secondary | ICD-10-CM

## 2016-01-02 MED ORDER — SUMATRIPTAN SUCCINATE 100 MG PO TABS: ORAL_TABLET | ORAL | 1 refills | Status: DC

## 2016-01-14 ENCOUNTER — Ambulatory Visit (INDEPENDENT_AMBULATORY_CARE_PROVIDER_SITE_OTHER): Payer: BLUE CROSS/BLUE SHIELD | Admitting: Family Medicine

## 2016-01-14 ENCOUNTER — Encounter: Payer: Self-pay | Admitting: Family Medicine

## 2016-01-14 DIAGNOSIS — M25572 Pain in left ankle and joints of left foot: Secondary | ICD-10-CM | POA: Diagnosis not present

## 2016-01-14 MED ORDER — DICLOFENAC SODIUM 75 MG PO TBEC: 75.0000 mg | DELAYED_RELEASE_TABLET | Freq: Two times a day (BID) | ORAL | 1 refills | Status: DC

## 2016-01-14 NOTE — Assessment & Plan Note (Signed)
2/2 peroneal tenosynovitis and metatarsal contusion.  MSK u/s was reassuring - small target sign of peroneals at ankle and thickening here.  Stressed importance of arch supports, avoiding flat shoes and barefoot walking.  Icing, diclofenac twice a day with food.  Shown home exercises to do daily.  F/u in 1 month for reevaluation.

## 2016-01-14 NOTE — Patient Instructions (Addendum)
You have peroneal tenosynovitis and a metatarsal contusion. Arch supports are very important for this - consider an OTC insert (dr. Jari Sportsmanscholls active series, spencos, superfeet) or our green sports insoles. Diclofenac 75mg  twice a day with food for pain and inflammation Stop the celebrex while you're taking diclofenac. Avoid flat shoes, barefoot walking as much as possible. Icing as needed. Do home exercises 3 sets of 10 once a day. Follow up with me in 1 month for reevaluation.

## 2016-01-14 NOTE — Progress Notes (Signed)
PCP: Donato SchultzYvonne R Lowne Chase, DO  Subjective:   HPI: Patient is a 50 y.o. female here for left foot pain.  Patient reports she's had 2 months of lateral left foot pain. No injury or trauma. Worse by end of day - spends 8 hours a day at work on her feet. Pain is 5/10 currently, up to 10/10 and sharp at worst. Tried aleve, celebrex, tramadol, hydrocodone. No skin changes, numbness.  Past Medical History:  Diagnosis Date  . Anxiety   . Asthma   . Hypertension   . MHA (microangiopathic hemolytic anemia) (HCC)     Current Outpatient Prescriptions on File Prior to Visit  Medication Sig Dispense Refill  . Albuterol Sulfate (PROAIR RESPICLICK) 108 (90 BASE) MCG/ACT AEPB Inhale 2 puffs into the lungs every 6 (six) hours as needed. 1 each 2  . ALPRAZolam (XANAX) 0.5 MG tablet TAKE ONE TABLET BY MOUTH THREE TIMES DAILY FOR SLEEP OR ANXIETY 90 tablet 2  . celecoxib (CELEBREX) 200 MG capsule TAKE 1 CAPSULE TWICE DAILY AS NEEDED 180 capsule 1  . ciprofloxacin (CIPRO) 250 MG tablet Take 1 tablet (250 mg total) by mouth 2 (two) times daily. 6 tablet 0  . fluticasone (FLONASE) 50 MCG/ACT nasal spray Place 2 sprays into both nostrils daily. 16 g 6  . Meclizine HCl (TRAVEL SICKNESS) 25 MG CHEW CHEW 1 TABLET EVERY 6 HOURS AS NEEDED 45 each 2  . mometasone-formoterol (DULERA) 200-5 MCG/ACT AERO Inhale 2 puffs into the lungs 2 (two) times daily. 1 Inhaler 5  . montelukast (SINGULAIR) 10 MG tablet Take 1 tablet (10 mg total) by mouth at bedtime. 90 tablet 3  . phenazopyridine (PYRIDIUM) 200 MG tablet Take 1 tablet (200 mg total) by mouth 3 (three) times daily as needed for pain. 6 tablet 0  . sucralfate (CARAFATE) 1 g tablet Take 1 tablet (1 g total) by mouth 2 (two) times daily. 14 tablet 0  . SUMAtriptan (IMITREX) 100 MG tablet TAKE 1 TABLET BY MOUTH AS NEEDED FOR MIGRAINE AND REPEAT IN 2 HOURS AS NEEDED 12 tablet 1  . topiramate (TOPAMAX) 50 MG tablet Take 1 tablet (50 mg total) by mouth 2 (two) times  daily. 180 tablet 3   No current facility-administered medications on file prior to visit.     No past surgical history on file.  Allergies  Allergen Reactions  . Fish Allergy Hives and Swelling  . Penicillins Anaphylaxis and Hives  . Iohexol      Desc: hives, sob, pt. needs 13 hr prep   01/16/05   . Ivp Dye [Iodinated Diagnostic Agents] Hives and Swelling    Social History   Social History  . Marital status: Married    Spouse name: N/A  . Number of children: N/A  . Years of education: N/A   Occupational History  . [employer] [place]   Social History Main Topics  . Smoking status: Never Smoker  . Smokeless tobacco: Never Used  . Alcohol use 0.0 oz/week     Comment: occassional  . Drug use: No  . Sexual activity: Yes    Partners: Male   Other Topics Concern  . Not on file   Social History Narrative   Exercise-- no    Family History  Problem Relation Age of Onset  . Asthma    . Diabetes    . Hyperlipidemia    . Throat cancer Father     BP 139/85   Pulse 72   Ht 4\' 11"  (  1.499 m)   Wt 170 lb (77.1 kg)   BMI 34.34 kg/m   Review of Systems: See HPI above.    Objective:  Physical Exam:  Gen: NAD, comfortable in exam room  Left foot/ankle: Overpronation. No gross deformity, swelling, ecchymoses FROM ankle without pain. TTP base 5th metatarsal, over peroneal tendons.  No other tenderness. Negative ant drawer and talar tilt.   Negative syndesmotic compression. Thompsons test negative. NV intact distally.  Right foot/ankle: FROM without pain.    Assessment & Plan:  1. Left ankle pain - 2/2 peroneal tenosynovitis and metatarsal contusion.  MSK u/s was reassuring - small target sign of peroneals at ankle and thickening here.  Stressed importance of arch supports, avoiding flat shoes and barefoot walking.  Icing, diclofenac twice a day with food.  Shown home exercises to do daily.  F/u in 1 month for reevaluation.

## 2016-01-21 ENCOUNTER — Encounter: Payer: Self-pay | Admitting: Family Medicine

## 2016-01-21 ENCOUNTER — Ambulatory Visit (INDEPENDENT_AMBULATORY_CARE_PROVIDER_SITE_OTHER): Payer: BLUE CROSS/BLUE SHIELD | Admitting: Family Medicine

## 2016-01-21 VITALS — BP 110/90 | HR 76 | Temp 98.1°F | Resp 16 | Ht 59.0 in | Wt 178.4 lb

## 2016-01-21 DIAGNOSIS — M25551 Pain in right hip: Secondary | ICD-10-CM

## 2016-01-21 DIAGNOSIS — R300 Dysuria: Secondary | ICD-10-CM

## 2016-01-21 DIAGNOSIS — R109 Unspecified abdominal pain: Secondary | ICD-10-CM | POA: Diagnosis not present

## 2016-01-21 LAB — POCT URINALYSIS DIPSTICK
BILIRUBIN UA: NEGATIVE
Blood, UA: NEGATIVE
GLUCOSE UA: NEGATIVE
KETONES UA: NEGATIVE
Leukocytes, UA: NEGATIVE
Nitrite, UA: NEGATIVE
Protein, UA: NEGATIVE
SPEC GRAV UA: 1.015
Urobilinogen, UA: 0.2
pH, UA: 6

## 2016-01-21 MED ORDER — TRAMADOL HCL 50 MG PO TABS: 50.0000 mg | ORAL_TABLET | Freq: Three times a day (TID) | ORAL | 0 refills | Status: DC | PRN

## 2016-01-21 NOTE — Progress Notes (Signed)
Patient ID: Deborah Watts Kullman, female    DOB: Aug 02, 1965  Age: 50 y.o. MRN: [id number]    Subjective:  Subjective  HPI Deborah Watts Goodrich presents for c/o abd pain and dysuria since Friday.  No fever, no NVD.  No hematuria.    Review of Systems  Constitutional: Negative for appetite change, diaphoresis, fatigue and unexpected weight change.  Eyes: Negative for pain, redness and visual disturbance.  Respiratory: Negative for cough, chest tightness, shortness of breath and wheezing.   Cardiovascular: Negative for chest pain, palpitations and leg swelling.  Gastrointestinal: Positive for abdominal pain. Negative for constipation, diarrhea, nausea, rectal pain and vomiting.  Endocrine: Negative for cold intolerance, heat intolerance, polydipsia, polyphagia and polyuria.  Genitourinary: Positive for dysuria. Negative for difficulty urinating and frequency.  Neurological: Negative for dizziness, light-headedness, numbness and headaches.    History Past Medical History:  Diagnosis Date  . Anxiety   . Asthma   . Hypertension   . MHA (microangiopathic hemolytic anemia) (HCC)     She has no past surgical history on file.   Her family history includes Throat cancer in her father.She reports that she has never smoked. She has never used smokeless tobacco. She reports that she drinks alcohol. She reports that she does not use drugs.  Current Outpatient Prescriptions on File Prior to Visit  Medication Sig Dispense Refill  . Albuterol Sulfate (PROAIR RESPICLICK) 108 (90 BASE) MCG/ACT AEPB Inhale 2 puffs into the lungs every 6 (six) hours as needed. 1 each 2  . ALPRAZolam (XANAX) 0.5 MG tablet TAKE ONE TABLET BY MOUTH THREE TIMES DAILY FOR SLEEP OR ANXIETY 90 tablet 2  . celecoxib (CELEBREX) 200 MG capsule TAKE 1 CAPSULE TWICE DAILY AS NEEDED 180 capsule 1  . ciprofloxacin (CIPRO) 250 MG tablet Take 1 tablet (250 mg total) by mouth 2 (two) times daily. 6 tablet 0  . diclofenac (VOLTAREN) 75 MG EC tablet  Take 1 tablet (75 mg total) by mouth 2 (two) times daily. 60 tablet 1  . fluticasone (FLONASE) 50 MCG/ACT nasal spray Place 2 sprays into both nostrils daily. 16 g 6  . Meclizine HCl (TRAVEL SICKNESS) 25 MG CHEW CHEW 1 TABLET EVERY 6 HOURS AS NEEDED 45 each 2  . mometasone-formoterol (DULERA) 200-5 MCG/ACT AERO Inhale 2 puffs into the lungs 2 (two) times daily. 1 Inhaler 5  . montelukast (SINGULAIR) 10 MG tablet Take 1 tablet (10 mg total) by mouth at bedtime. 90 tablet 3  . phenazopyridine (PYRIDIUM) 200 MG tablet Take 1 tablet (200 mg total) by mouth 3 (three) times daily as needed for pain. 6 tablet 0  . sucralfate (CARAFATE) 1 g tablet Take 1 tablet (1 g total) by mouth 2 (two) times daily. 14 tablet 0  . SUMAtriptan (IMITREX) 100 MG tablet TAKE 1 TABLET BY MOUTH AS NEEDED FOR MIGRAINE AND REPEAT IN 2 HOURS AS NEEDED 12 tablet 1  . topiramate (TOPAMAX) 50 MG tablet Take 1 tablet (50 mg total) by mouth 2 (two) times daily. 180 tablet 3   No current facility-administered medications on file prior to visit.      Objective:  Objective  Physical Exam  Constitutional: She is oriented to person, place, and time. She appears well-developed and well-nourished.  HENT:  Head: Normocephalic and atraumatic.  Eyes: Conjunctivae and EOM are normal.  Neck: Normal range of motion. Neck supple. No JVD present. Carotid bruit is not present. No thyromegaly present.  Cardiovascular: Normal rate, regular rhythm and normal heart sounds.  No murmur heard. Pulmonary/Chest: Effort normal and breath sounds normal. No respiratory distress. She has no wheezes. She has no rales. She exhibits no tenderness.  Abdominal: Soft. Bowel sounds are normal. She exhibits no distension and no mass. There is tenderness. There is no rebound and no guarding.    Musculoskeletal: She exhibits no edema.  Neurological: She is alert and oriented to person, place, and time.  Psychiatric: She has a normal mood and affect.  Nursing  note and vitals reviewed.  BP 110/90 (BP Location: Left Arm, Patient Position: Sitting, Cuff Size: Normal)   Pulse 76   Temp 98.1 F (36.7 C) (Oral)   Resp 16   Ht 4\' 11"  (1.499 m)   Wt 178 lb 6.4 oz (80.9 kg)   SpO2 98%   BMI 36.03 kg/m  Wt Readings from Last 3 Encounters:  01/21/16 178 lb 6.4 oz (80.9 kg)  01/14/16 170 lb (77.1 kg)  07/23/15 177 lb 9.6 oz (80.6 kg)     Lab Results  Component Value Date   WBC 11.2 (H) 03/29/2015   HGB 13.6 03/29/2015   HCT 41.8 03/29/2015   PLT 310 03/29/2015   GLUCOSE 88 03/29/2015   CHOL 187 03/23/2015   TRIG 199.0 (H) 03/23/2015   HDL 51.30 03/23/2015   LDLDIRECT 107.1 03/20/2014   LDLCALC 96 03/23/2015   ALT 13 (L) 03/29/2015   AST 13 (L) 03/29/2015   NA 147 (H) 03/29/2015   K 3.9 03/29/2015   CL 112 (H) 03/29/2015   CREATININE 0.71 03/29/2015   BUN 14 03/29/2015   CO2 23 03/29/2015   TSH 1.00 03/23/2015    No results found.   Assessment & Plan:  Plan  I am having Ms. Criado start on traMADol. I am also having her maintain her Albuterol Sulfate, mometasone-formoterol, fluticasone, montelukast, topiramate, sucralfate, Meclizine HCl, ciprofloxacin, phenazopyridine, celecoxib, ALPRAZolam, SUMAtriptan, and diclofenac.  Meds ordered this encounter  Medications  . traMADol (ULTRAM) 50 MG tablet    Sig: Take 1 tablet (50 mg total) by mouth every 8 (eight) hours as needed.    Dispense:  30 tablet    Refill:  0    Problem List Items Addressed This Visit    None    Visit Diagnoses    Abdominal pain, unspecified abdominal location    -  Primary   Relevant Medications   traMADol (ULTRAM) 50 MG tablet   Other Relevant Orders   POCT Urinalysis Dipstick (Completed)   US Pelvis Complete   US Transvaginal Non-OB   CBC with Differential/Platelet   Comprehensive metabolic panel   Pain in joint involving right pelvic region and thigh       Relevant Orders   US Pelvis Complete   US Transvaginal Non-OB   Dysuria       Relevant  Orders   Urine culture   CBC with Differential/Platelet   Comprehensive metabolic panel     If pt worsens tonight-- go to ER  Follow-up: No Follow-up on file.  Donato SchultzYvonne R Lowne Chase, DO

## 2016-01-21 NOTE — Progress Notes (Signed)
Pre visit review using our clinic review tool, if applicable. No additional management support is needed unless otherwise documented below in the visit note. 

## 2016-01-21 NOTE — Patient Instructions (Signed)

## 2016-01-22 ENCOUNTER — Ambulatory Visit (HOSPITAL_BASED_OUTPATIENT_CLINIC_OR_DEPARTMENT_OTHER)
Admission: RE | Admit: 2016-01-22 | Discharge: 2016-01-22 | Disposition: A | Discharge status: Home / Self Care | Payer: BLUE CROSS/BLUE SHIELD | Source: Ambulatory Visit | Attending: Family Medicine | Admitting: Family Medicine

## 2016-01-22 ENCOUNTER — Encounter: Payer: Self-pay | Admitting: Family Medicine

## 2016-01-22 DIAGNOSIS — M25551 Pain in right hip: Secondary | ICD-10-CM

## 2016-01-22 DIAGNOSIS — R1031 Right lower quadrant pain: Secondary | ICD-10-CM | POA: Diagnosis not present

## 2016-01-22 DIAGNOSIS — R109 Unspecified abdominal pain: Secondary | ICD-10-CM

## 2016-01-22 LAB — CBC WITH DIFFERENTIAL/PLATELET
Basophils Absolute: 0.1 10*3/uL (ref 0.0–0.1)
Basophils Relative: 1 % (ref 0.0–3.0)
EOS PCT: 3.5 % (ref 0.0–5.0)
Eosinophils Absolute: 0.3 10*3/uL (ref 0.0–0.7)
HEMATOCRIT: 42.8 % (ref 36.0–46.0)
HEMOGLOBIN: 14 g/dL (ref 12.0–15.0)
LYMPHS PCT: 23.6 % (ref 12.0–46.0)
Lymphs Abs: 2.2 10*3/uL (ref 0.7–4.0)
MCHC: 32.8 g/dL (ref 30.0–36.0)
MCV: 86.6 fl (ref 78.0–100.0)
MONOS PCT: 5.9 % (ref 3.0–12.0)
Monocytes Absolute: 0.6 10*3/uL (ref 0.1–1.0)
Neutro Abs: 6.3 10*3/uL (ref 1.4–7.7)
Neutrophils Relative %: 66 % (ref 43.0–77.0)
Platelets: 334 10*3/uL (ref 150.0–400.0)
RBC: 4.94 Mil/uL (ref 3.87–5.11)
RDW: 14.4 % (ref 11.5–15.5)
WBC: 9.5 10*3/uL (ref 4.0–10.5)

## 2016-01-22 LAB — COMPREHENSIVE METABOLIC PANEL
ALBUMIN: 4.5 g/dL (ref 3.5–5.2)
ALK PHOS: 51 U/L (ref 39–117)
ALT: 12 U/L (ref 0–35)
AST: 17 U/L (ref 0–37)
BILIRUBIN TOTAL: 0.3 mg/dL (ref 0.2–1.2)
BUN: 13 mg/dL (ref 6–23)
CO2: 26 mEq/L (ref 19–32)
Calcium: 9.6 mg/dL (ref 8.4–10.5)
Chloride: 109 mEq/L (ref 96–112)
Creatinine, Ser: 0.86 mg/dL (ref 0.40–1.20)
GFR: 73.77 mL/min (ref >60.00)
Glucose, Bld: 83 mg/dL (ref 70–99)
POTASSIUM: 3.7 meq/L (ref 3.5–5.1)
Sodium: 142 mEq/L (ref 135–145)
TOTAL PROTEIN: 7.5 g/dL (ref 6.0–8.3)

## 2016-01-22 LAB — URINE CULTURE: ORGANISM ID, BACTERIA: NO GROWTH

## 2016-01-29 ENCOUNTER — Other Ambulatory Visit: Payer: Self-pay

## 2016-01-29 DIAGNOSIS — F411 Generalized anxiety disorder: Secondary | ICD-10-CM

## 2016-01-29 MED ORDER — ALPRAZOLAM 0.5 MG PO TABS: ORAL_TABLET | ORAL | 2 refills | Status: DC

## 2016-01-29 NOTE — Telephone Encounter (Signed)
Last seen 01/21/16 Last filled #90- 2 rf   11/14/15 QMV:HQIOSig:TAKE ONE TABLET BY MOUTH THREE TIMES DAILY FOR SLEEP OR ANXIETY Please advise PC

## 2016-01-31 MED ORDER — ALPRAZOLAM 0.5 MG PO TABS: ORAL_TABLET | ORAL | 2 refills | Status: DC

## 2016-01-31 NOTE — Telephone Encounter (Signed)
Rx faxed Pc 

## 2016-01-31 NOTE — Addendum Note (Signed)
Addended by: Crissie SicklesARTER, Carrolyn Hilmes A on: 01/31/2016 02:13 PM   Modules accepted: Orders

## 2016-02-13 ENCOUNTER — Encounter: Payer: Self-pay | Admitting: Family Medicine

## 2016-02-13 ENCOUNTER — Ambulatory Visit (INDEPENDENT_AMBULATORY_CARE_PROVIDER_SITE_OTHER): Payer: BLUE CROSS/BLUE SHIELD | Admitting: Family Medicine

## 2016-02-13 DIAGNOSIS — M25572 Pain in left ankle and joints of left foot: Secondary | ICD-10-CM

## 2016-02-13 MED ORDER — NITROGLYCERIN 0.2 MG/HR TD PT24: MEDICATED_PATCH | TRANSDERMAL | 1 refills | Status: DC

## 2016-02-13 NOTE — Patient Instructions (Signed)
You have peroneal tenosynovitis. Continue the inserts. Diclofenac 75mg  twice a day with food for pain and inflammation as needed. Avoid flat shoes, barefoot walking as much as possible. Icing as needed. Start nitro patches - 1/4th patch over affected ankle, change daily. Do home exercises 3 sets of 10 once a day. Follow up with me in 6 weeks for reevaluation. Consider physical therapy, custom orthotics if not improving.

## 2016-02-14 NOTE — Progress Notes (Signed)
PCP: Donato SchultzYvonne R Lowne Chase, DO  Subjective:   HPI: Patient is a 50 y.o. female here for left foot pain.  10/30: Patient reports she's had 2 months of lateral left foot pain. No injury or trauma. Worse by end of day - spends 8 hours a day at work on her feet. Pain is 5/10 currently, up to 10/10 and sharp at worst. Tried aleve, celebrex, tramadol, hydrocodone. No skin changes, numbness.  11/29: Patient reports she continues to have left foot/ankle pain laterally. No new injury or trauma. Pain worse with prolonged standing. Has been icing, taking diclofenac Tried two different OTC insoles. Radiates up ankle/lower leg. No skin changes, numbness. Pain level 2/10.  Past Medical History:  Diagnosis Date  . Anxiety   . Asthma   . Hypertension   . MHA (microangiopathic hemolytic anemia) (HCC)     Current Outpatient Prescriptions on File Prior to Visit  Medication Sig Dispense Refill  . Albuterol Sulfate (PROAIR RESPICLICK) 108 (90 BASE) MCG/ACT AEPB Inhale 2 puffs into the lungs every 6 (six) hours as needed. 1 each 2  . ALPRAZolam (XANAX) 0.5 MG tablet TAKE ONE TABLET BY MOUTH THREE TIMES DAILY FOR SLEEP OR ANXIETY 90 tablet 2  . ALPRAZolam (XANAX) 0.5 MG tablet TAKE ONE TABLET BY MOUTH THREE TIMES DAILY FOR SLEEP OR ANXIETY 90 tablet 2  . celecoxib (CELEBREX) 200 MG capsule TAKE 1 CAPSULE TWICE DAILY AS NEEDED 180 capsule 1  . ciprofloxacin (CIPRO) 250 MG tablet Take 1 tablet (250 mg total) by mouth 2 (two) times daily. 6 tablet 0  . diclofenac (VOLTAREN) 75 MG EC tablet Take 1 tablet (75 mg total) by mouth 2 (two) times daily. 60 tablet 1  . fluticasone (FLONASE) 50 MCG/ACT nasal spray Place 2 sprays into both nostrils daily. 16 g 6  . Meclizine HCl (TRAVEL SICKNESS) 25 MG CHEW CHEW 1 TABLET EVERY 6 HOURS AS NEEDED 45 each 2  . mometasone-formoterol (DULERA) 200-5 MCG/ACT AERO Inhale 2 puffs into the lungs 2 (two) times daily. 1 Inhaler 5  . montelukast (SINGULAIR) 10 MG tablet  Take 1 tablet (10 mg total) by mouth at bedtime. 90 tablet 3  . phenazopyridine (PYRIDIUM) 200 MG tablet Take 1 tablet (200 mg total) by mouth 3 (three) times daily as needed for pain. 6 tablet 0  . sucralfate (CARAFATE) 1 g tablet Take 1 tablet (1 g total) by mouth 2 (two) times daily. 14 tablet 0  . SUMAtriptan (IMITREX) 100 MG tablet TAKE 1 TABLET BY MOUTH AS NEEDED FOR MIGRAINE AND REPEAT IN 2 HOURS AS NEEDED 12 tablet 1  . topiramate (TOPAMAX) 50 MG tablet Take 1 tablet (50 mg total) by mouth 2 (two) times daily. 180 tablet 3  . traMADol (ULTRAM) 50 MG tablet Take 1 tablet (50 mg total) by mouth every 8 (eight) hours as needed. 30 tablet 0   No current facility-administered medications on file prior to visit.     No past surgical history on file.  Allergies  Allergen Reactions  . Fish Allergy Hives and Swelling  . Penicillins Anaphylaxis and Hives  . Iohexol      Desc: hives, sob, pt. needs 13 hr prep   01/16/05   . Ivp Dye [Iodinated Diagnostic Agents] Hives and Swelling    Social History   Social History  . Marital status: Married    Spouse name: N/A  . Number of children: N/A  . Years of education: N/A   Occupational History  . [employer] - [place]   Social History Main Topics  . Smoking status: Never Smoker  . Smokeless tobacco: Never Used  . Alcohol use 0.0 oz/week     Comment: occassional  . Drug use: No  . Sexual activity: Yes    Partners: Male   Other Topics Concern  . Not on file   Social History Narrative   Exercise-- no    Family History  Problem Relation Age of Onset  . Asthma    . Diabetes    . Hyperlipidemia    . Throat cancer Father     BP 114/77   Pulse 69   Ht 4\' 11"  (1.499 m)   Wt 179 lb (81.2 kg)   BMI 36.15 kg/m   Review of Systems: See HPI above.    Objective:  Physical Exam:  Gen: NAD, comfortable in exam room  Left foot/ankle: Overpronation. No gross deformity, swelling, ecchymoses FROM ankle without  pain. TTP base 5th metatarsal, over peroneal tendons.  No other tenderness. Negative ant drawer and talar tilt.   Negative syndesmotic compression. Negative metatarsal squeeze. Thompsons test negative. NV intact distally.  Right foot/ankle: FROM without pain.    Assessment & Plan:  1. Left ankle pain - 2/2 peroneal tenosynovitis.  MSK u/s was reassuring - small target sign of peroneals at ankle and thickening here.  Has not improved with icing, diclofenac, OTC inserts.  Discussed options - she would like to try nitro patches.  Will consider physical therapy, custom orthotics.  F/u in 6 weeks.

## 2016-02-14 NOTE — Assessment & Plan Note (Signed)
2/2 peroneal tenosynovitis.  MSK u/s was reassuring - small target sign of peroneals at ankle and thickening here.  Has not improved with icing, diclofenac, OTC inserts.  Discussed options - she would like to try nitro patches.  Will consider physical therapy, custom orthotics.  F/u in 6 weeks.

## 2016-03-07 ENCOUNTER — Other Ambulatory Visit: Payer: Self-pay | Admitting: *Deleted

## 2016-03-07 MED ORDER — ALBUTEROL SULFATE 108 (90 BASE) MCG/ACT IN AEPB
2.0000 | INHALATION_SPRAY | Freq: Four times a day (QID) | RESPIRATORY_TRACT | 0 refills | Status: DC | PRN
Start: 2016-03-07 — End: 2017-03-27

## 2016-03-25 ENCOUNTER — Ambulatory Visit (INDEPENDENT_AMBULATORY_CARE_PROVIDER_SITE_OTHER): Payer: BLUE CROSS/BLUE SHIELD | Admitting: Family Medicine

## 2016-03-25 ENCOUNTER — Ambulatory Visit: Payer: BLUE CROSS/BLUE SHIELD | Admitting: Family Medicine

## 2016-03-25 ENCOUNTER — Encounter: Payer: BLUE CROSS/BLUE SHIELD | Admitting: Family Medicine

## 2016-03-25 ENCOUNTER — Encounter: Payer: Self-pay | Admitting: Family Medicine

## 2016-03-25 VITALS — BP 118/78 | HR 73 | Temp 98.3°F | Resp 16 | Ht 59.0 in | Wt 176.4 lb

## 2016-03-25 DIAGNOSIS — J45909 Unspecified asthma, uncomplicated: Secondary | ICD-10-CM | POA: Diagnosis not present

## 2016-03-25 DIAGNOSIS — F411 Generalized anxiety disorder: Secondary | ICD-10-CM

## 2016-03-25 DIAGNOSIS — Z1231 Encounter for screening mammogram for malignant neoplasm of breast: Secondary | ICD-10-CM

## 2016-03-25 DIAGNOSIS — J0110 Acute frontal sinusitis, unspecified: Secondary | ICD-10-CM | POA: Diagnosis not present

## 2016-03-25 DIAGNOSIS — M170 Bilateral primary osteoarthritis of knee: Secondary | ICD-10-CM

## 2016-03-25 DIAGNOSIS — Z8669 Personal history of other diseases of the nervous system and sense organs: Secondary | ICD-10-CM | POA: Diagnosis not present

## 2016-03-25 DIAGNOSIS — Z0000 Encounter for general adult medical examination without abnormal findings: Secondary | ICD-10-CM | POA: Diagnosis not present

## 2016-03-25 DIAGNOSIS — Z1239 Encounter for other screening for malignant neoplasm of breast: Secondary | ICD-10-CM

## 2016-03-25 DIAGNOSIS — H8110 Benign paroxysmal vertigo, unspecified ear: Secondary | ICD-10-CM

## 2016-03-25 DIAGNOSIS — Z114 Encounter for screening for human immunodeficiency virus [HIV]: Secondary | ICD-10-CM

## 2016-03-25 LAB — HIV ANTIBODY (ROUTINE TESTING W REFLEX): HIV 1&2 Ab, 4th Generation: NONREACTIVE

## 2016-03-25 MED ORDER — DICLOFENAC SODIUM 75 MG PO TBEC: 75.0000 mg | DELAYED_RELEASE_TABLET | Freq: Two times a day (BID) | ORAL | 1 refills | Status: DC

## 2016-03-25 MED ORDER — SUMATRIPTAN SUCCINATE 100 MG PO TABS
ORAL_TABLET | ORAL | 1 refills | Status: DC
Start: 2016-03-25 — End: 2017-03-27

## 2016-03-25 MED ORDER — FLUTICASONE PROPIONATE 50 MCG/ACT NA SUSP
2.0000 | Freq: Every day | NASAL | 6 refills | Status: DC
Start: 2016-03-25 — End: 2017-03-27

## 2016-03-25 MED ORDER — MOMETASONE FURO-FORMOTEROL FUM 200-5 MCG/ACT IN AERO: 2.0000 | INHALATION_SPRAY | Freq: Two times a day (BID) | RESPIRATORY_TRACT | 5 refills | Status: DC

## 2016-03-25 MED ORDER — MECLIZINE HCL 25 MG PO CHEW: CHEWABLE_TABLET | ORAL | 2 refills | Status: DC

## 2016-03-25 MED ORDER — MONTELUKAST SODIUM 10 MG PO TABS: 10.0000 mg | ORAL_TABLET | Freq: Every day | ORAL | 3 refills | Status: DC

## 2016-03-25 MED ORDER — TOPIRAMATE 50 MG PO TABS: 50.0000 mg | ORAL_TABLET | Freq: Two times a day (BID) | ORAL | 3 refills | Status: DC

## 2016-03-25 NOTE — Patient Instructions (Signed)

## 2016-03-25 NOTE — Assessment & Plan Note (Signed)
ghm utd Check labs See AVS 

## 2016-03-25 NOTE — Progress Notes (Signed)
Pre visit review using our clinic review tool, if applicable. No additional management support is needed unless otherwise documented below in the visit note. 

## 2016-03-25 NOTE — Progress Notes (Signed)
Subjective:    Patient ID: Deborah Watts, female    DOB: November 12, 1965, 51 y.o.   MRN: [id number]  Chief Complaint  Patient presents with  . Annual Exam    HPI Patient is in today for CPE.  No complaints.  She needs refills as well.  There was a problem getting UDS last time.  Her ins would not pay for it.   Past Medical History:  Diagnosis Date  . Anxiety   . Asthma   . Hypertension   . MHA (microangiopathic hemolytic anemia) (HCC)     Past Surgical History:  Procedure Laterality Date  . APPENDECTOMY N/A   . LAPAROSCOPIC CHOLECYSTECTOMY N/A 03/1992  . TOTAL ABDOMINAL HYSTERECTOMY Right     Family History  Problem Relation Age of Onset  . Throat cancer Father   . Asthma    . Diabetes    . Hyperlipidemia      Social History   Social History  . Marital status: Married    Spouse name: N/A  . Number of children: N/A  . Years of education: N/A   Occupational History  . [employer] [place]   Social History Main Topics  . Smoking status: Never Smoker  . Smokeless tobacco: Never Used  . Alcohol use 0.0 oz/week     Comment: occassional  . Drug use: No  . Sexual activity: Yes    Partners: Male   Other Topics Concern  . Not on file   Social History Narrative   Exercise-- no    Outpatient Medications Prior to Visit  Medication Sig Dispense Refill  . Albuterol Sulfate (PROAIR RESPICLICK) 108 (90 Base) MCG/ACT AEPB Inhale 2 puffs into the lungs every 6 (six) hours as needed. 1 each 0  . ALPRAZolam (XANAX) 0.5 MG tablet TAKE ONE TABLET BY MOUTH THREE TIMES DAILY FOR SLEEP OR ANXIETY 90 tablet 2  . nitroGLYCERIN (NITRODUR - DOSED IN MG/24 HR) 0.2 mg/hr patch Apply 1/4th patch to affected ankle, change daily 30 patch 1  . traMADol (ULTRAM) 50 MG tablet Take 1 tablet (50 mg total) by mouth every 8 (eight) hours as needed. 30 tablet 0  . celecoxib (CELEBREX) 200 MG capsule TAKE 1 CAPSULE TWICE DAILY AS NEEDED 180 capsule 1  . fluticasone (FLONASE) 50  MCG/ACT nasal spray Place 2 sprays into both nostrils daily. 16 g 6  . Meclizine HCl (TRAVEL SICKNESS) 25 MG CHEW CHEW 1 TABLET EVERY 6 HOURS AS NEEDED 45 each 2  . mometasone-formoterol (DULERA) 200-5 MCG/ACT AERO Inhale 2 puffs into the lungs 2 (two) times daily. 1 Inhaler 5  . montelukast (SINGULAIR) 10 MG tablet Take 1 tablet (10 mg total) by mouth at bedtime. 90 tablet 3  . SUMAtriptan (IMITREX) 100 MG tablet TAKE 1 TABLET BY MOUTH AS NEEDED FOR MIGRAINE AND REPEAT IN 2 HOURS AS NEEDED 12 tablet 1  . topiramate (TOPAMAX) 50 MG tablet Take 1 tablet (50 mg total) by mouth 2 (two) times daily. 180 tablet 3  . ALPRAZolam (XANAX) 0.5 MG tablet TAKE ONE TABLET BY MOUTH THREE TIMES DAILY FOR SLEEP OR ANXIETY 90 tablet 2  . ciprofloxacin (CIPRO) 250 MG tablet Take 1 tablet (250 mg total) by mouth 2 (two) times daily. 6 tablet 0  . diclofenac (VOLTAREN) 75 MG EC tablet Take 1 tablet (75 mg total) by mouth 2 (two) times daily. (Patient not taking: Reported on 03/25/2016) 60 tablet 1  . phenazopyridine (PYRIDIUM) 200 MG tablet Take 1 tablet (200 mg  total) by mouth 3 (three) times daily as needed for pain. 6 tablet 0  . sucralfate (CARAFATE) 1 g tablet Take 1 tablet (1 g total) by mouth 2 (two) times daily. 14 tablet 0   No facility-administered medications prior to visit.     Allergies  Allergen Reactions  . Fish Allergy Hives and Swelling  . Penicillins Anaphylaxis and Hives  . Iohexol      Desc: hives, sob, pt. needs 13 hr prep   01/16/05   . Ivp Dye [Iodinated Diagnostic Agents] Hives and Swelling    Review of Systems  Constitutional: Negative for fever and malaise/fatigue.  HENT: Negative for congestion.   Eyes: Negative for blurred vision.  Respiratory: Negative for cough and shortness of breath.   Cardiovascular: Negative for chest pain, palpitations and leg swelling.  Gastrointestinal: Negative for vomiting.  Musculoskeletal: Negative for back pain.  Skin: Negative for rash.    Neurological: Negative for loss of consciousness and headaches.       Objective:    Physical Exam  Constitutional: She is oriented to person, place, and time. She appears well-developed and well-nourished. No distress.  HENT:  Head: Normocephalic and atraumatic.  Eyes: Conjunctivae are normal.  Neck: Normal range of motion. No thyromegaly present.  Cardiovascular: Normal rate and regular rhythm.   Pulmonary/Chest: Effort normal and breath sounds normal. She has no wheezes.  Abdominal: Soft. Bowel sounds are normal. There is no tenderness.  Musculoskeletal: Normal range of motion. She exhibits no edema or deformity.  Neurological: She is alert and oriented to person, place, and time.  Skin: Skin is warm and dry. She is not diaphoretic.  Psychiatric: She has a normal mood and affect.    BP 118/78 (BP Location: Left Arm, Cuff Size: Large)   Pulse 73   Temp 98.3 F (36.8 C) (Oral)   Resp 16   Ht 4\' 11"  (1.499 m)   Wt 176 lb 6.4 oz (80 kg)   SpO2 96%   BMI 35.63 kg/m  Wt Readings from Last 3 Encounters:  03/25/16 176 lb 6.4 oz (80 kg)  02/13/16 179 lb (81.2 kg)  01/21/16 178 lb 6.4 oz (80.9 kg)     Lab Results  Component Value Date   WBC 9.5 01/21/2016   HGB 14.0 01/21/2016   HCT 42.8 01/21/2016   PLT 334.0 01/21/2016   GLUCOSE 83 01/21/2016   CHOL 187 03/23/2015   TRIG 199.0 (H) 03/23/2015   HDL 51.30 03/23/2015   LDLDIRECT 107.1 03/20/2014   LDLCALC 96 03/23/2015   ALT 12 01/21/2016   AST 17 01/21/2016   NA 142 01/21/2016   K 3.7 01/21/2016   CL 109 01/21/2016   CREATININE 0.86 01/21/2016   BUN 13 01/21/2016   CO2 26 01/21/2016   TSH 1.00 03/23/2015    Lab Results  Component Value Date   TSH 1.00 03/23/2015   Lab Results  Component Value Date   WBC 9.5 01/21/2016   HGB 14.0 01/21/2016   HCT 42.8 01/21/2016   MCV 86.6 01/21/2016   PLT 334.0 01/21/2016   Lab Results  Component Value Date   NA 142 01/21/2016   K 3.7 01/21/2016   CO2 26  01/21/2016   GLUCOSE 83 01/21/2016   BUN 13 01/21/2016   CREATININE 0.86 01/21/2016   BILITOT 0.3 01/21/2016   ALKPHOS 51 01/21/2016   AST 17 01/21/2016   ALT 12 01/21/2016   PROT 7.5 01/21/2016   ALBUMIN 4.5 01/21/2016   CALCIUM 9.6  01/21/2016   ANIONGAP 12 03/29/2015   GFR 73.77 01/21/2016   Lab Results  Component Value Date   CHOL 187 03/23/2015   Lab Results  Component Value Date   HDL 51.30 03/23/2015   Lab Results  Component Value Date   LDLCALC 96 03/23/2015   Lab Results  Component Value Date   TRIG 199.0 (H) 03/23/2015   Lab Results  Component Value Date   CHOLHDL 4 03/23/2015   No results found for: HGBA1C    I acted as a Neurosurgeon for Dr. Zola Button.  Apolonio Schneiders, CMA  Assessment & Plan:   Problem List Items Addressed This Visit      Unprioritized   Sinusitis, acute   Relevant Medications   fluticasone (FLONASE) 50 MCG/ACT nasal spray   Preventative health care - Primary    ghm utd Check labs  See AVS      Relevant Orders   Ambulatory referral to Gastroenterology    Other Visit Diagnoses    Asthma, chronic, unspecified asthma severity, uncomplicated       Relevant Medications   montelukast (SINGULAIR) 10 MG tablet   mometasone-formoterol (DULERA) 200-5 MCG/ACT AERO   Hx of migraines       Relevant Medications   topiramate (TOPAMAX) 50 MG tablet   SUMAtriptan (IMITREX) 100 MG tablet   Encounter for screening for HIV       Relevant Orders   HIV antibody   Primary osteoarthritis of both knees       Relevant Medications   diclofenac (VOLTAREN) 75 MG EC tablet   Benign paroxysmal positional vertigo, unspecified laterality       Relevant Medications   Meclizine HCl (TRAVEL SICKNESS) 25 MG CHEW   Generalized anxiety disorder       Breast cancer screening       Relevant Orders   MM SCREENING BREAST TOMO BILATERAL      I have discontinued Ms. Okey's sucralfate, ciprofloxacin, phenazopyridine, and celecoxib. I am also having her maintain her  traMADol, ALPRAZolam, nitroGLYCERIN, Albuterol Sulfate, montelukast, fluticasone, topiramate, mometasone-formoterol, diclofenac, SUMAtriptan, and Meclizine HCl.  Meds ordered this encounter  Medications  . montelukast (SINGULAIR) 10 MG tablet    Sig: Take 1 tablet (10 mg total) by mouth at bedtime.    Dispense:  90 tablet    Refill:  3  . fluticasone (FLONASE) 50 MCG/ACT nasal spray    Sig: Place 2 sprays into both nostrils daily.    Dispense:  16 g    Refill:  6  . topiramate (TOPAMAX) 50 MG tablet    Sig: Take 1 tablet (50 mg total) by mouth 2 (two) times daily.    Dispense:  180 tablet    Refill:  3  . mometasone-formoterol (DULERA) 200-5 MCG/ACT AERO    Sig: Inhale 2 puffs into the lungs 2 (two) times daily.    Dispense:  1 Inhaler    Refill:  5  . diclofenac (VOLTAREN) 75 MG EC tablet    Sig: Take 1 tablet (75 mg total) by mouth 2 (two) times daily.    Dispense:  60 tablet    Refill:  1  . SUMAtriptan (IMITREX) 100 MG tablet    Sig: TAKE 1 TABLET BY MOUTH AS NEEDED FOR MIGRAINE AND REPEAT IN 2 HOURS AS NEEDED    Dispense:  12 tablet    Refill:  1  . Meclizine HCl (TRAVEL SICKNESS) 25 MG CHEW    Sig: CHEW 1 TABLET EVERY 6 HOURS AS NEEDED  Dispense:  45 each    Refill:  2    CMA served as scribe during this visit. History, Physical and Plan performed by medical provider. Documentation and orders reviewed and attested to.   Donato Schultz, DO

## 2016-03-26 ENCOUNTER — Ambulatory Visit: Payer: BLUE CROSS/BLUE SHIELD | Admitting: Family Medicine

## 2016-04-23 ENCOUNTER — Ambulatory Visit (INDEPENDENT_AMBULATORY_CARE_PROVIDER_SITE_OTHER): Payer: BLUE CROSS/BLUE SHIELD | Admitting: Family Medicine

## 2016-04-23 ENCOUNTER — Encounter: Payer: Self-pay | Admitting: Family Medicine

## 2016-04-23 VITALS — BP 134/84 | HR 79 | Temp 98.0°F | Ht 59.0 in | Wt 177.2 lb

## 2016-04-23 DIAGNOSIS — B9689 Other specified bacterial agents as the cause of diseases classified elsewhere: Secondary | ICD-10-CM | POA: Diagnosis not present

## 2016-04-23 DIAGNOSIS — R110 Nausea: Secondary | ICD-10-CM

## 2016-04-23 DIAGNOSIS — J45909 Unspecified asthma, uncomplicated: Secondary | ICD-10-CM

## 2016-04-23 DIAGNOSIS — J208 Acute bronchitis due to other specified organisms: Secondary | ICD-10-CM | POA: Diagnosis not present

## 2016-04-23 MED ORDER — MOMETASONE FURO-FORMOTEROL FUM 200-5 MCG/ACT IN AERO: 2.0000 | INHALATION_SPRAY | Freq: Two times a day (BID) | RESPIRATORY_TRACT | 5 refills | Status: DC

## 2016-04-23 MED ORDER — PROMETHAZINE HCL 6.25 MG/5ML PO SYRP: 6.2500 mg | ORAL_SOLUTION | Freq: Four times a day (QID) | ORAL | 0 refills | Status: DC | PRN

## 2016-04-23 MED ORDER — BENZONATATE 100 MG PO CAPS: 100.0000 mg | ORAL_CAPSULE | Freq: Three times a day (TID) | ORAL | 0 refills | Status: DC | PRN

## 2016-04-23 MED ORDER — AZITHROMYCIN 250 MG PO TABS: ORAL_TABLET | ORAL | 0 refills | Status: DC

## 2016-04-23 NOTE — Progress Notes (Signed)
Chief Complaint  Patient presents with  . Nasal Congestion    and chest,and cough-sxs x 1 1/2 weeks    [name] here for URI complaints.  Duration: 9 days  Associated symptoms: sinus congestion, chest tightness and cough Denies: rhinorrhea, itchy watery eyes, ear pain, ear drainage, sore throat, wheezing, myalgia and fevers/rigors +Hx of asthma (mild intermittent as she does not use Dulera and rarely uses albuterol). Treatment to date: Started Dulera again, albuterol, Mucinex Sick contacts: Yes- people have been sick at work  ROS:  Const: Denies fevers HEENT: As noted in HPI Lungs: +cough  Past Medical History:  Diagnosis Date  . Anxiety   . Asthma   . Hypertension   . MHA (microangiopathic hemolytic anemia) (HCC)    Family History  Problem Relation Age of Onset  . Throat cancer Father   . Asthma    . Diabetes    . Hyperlipidemia      BP 134/84 (BP Location: Left Arm, Patient Position: Sitting, Cuff Size: Normal)   Pulse 79   Temp 98 F (36.7 C) (Oral)   Ht 4\' 11"  (1.499 m)   Wt 177 lb 3.2 oz (80.4 kg)   SpO2 99%   BMI 35.79 kg/m  General: Awake, alert, appears stated age HEENT: AT, [place], ears patent b/l and TM's neg, nares patent w/o discharge, pharynx pink and without exudates, MMM Neck: No masses or asymmetry Heart: RRR, no murmurs, no bruits Lungs: CTAB, no accessory muscle use Psych: Age appropriate judgment and insight, normal mood and affect  Acute bacterial bronchitis - Plan: benzonatate (TESSALON) 100 MG capsule, azithromycin (ZITHROMAX) 250 MG tablet  Nausea - Plan: promethazine (PHENERGAN) 6.25 MG/5ML syrup  Asthma, chronic, unspecified asthma severity, uncomplicated - Plan: mometasone-formoterol (DULERA) 200-5 MCG/ACT AERO  Orders as above. Supportive care over next 3 days, if no better or worsening, take abx. She inquired about steroids. I stated I did not hear any wheezing and her Oxygen is in good standing, thus this is not likely to be  helpful. Continue to push fluids, practice good hand hygiene, cover mouth when coughing. F/u prn. Pt voiced understanding and agreement to the plan.  Deborah Rocheicholas Paul [place], DO 04/23/16 7:49 AM

## 2016-04-23 NOTE — Patient Instructions (Addendum)
Continue to push fluids, practice good hand hygiene, and cover your mouth if you cough.  If you start having fevers, shaking, worsening shortness of breath despite using inhalers, or worsening symptoms, let us know.  If things start to get worse take azithromycin (antibiotic). Otherwise, wait 3 days and if you are still not getting better, take antibiotic.

## 2016-04-25 ENCOUNTER — Encounter: Payer: Self-pay | Admitting: Family Medicine

## 2016-05-21 ENCOUNTER — Other Ambulatory Visit: Payer: Self-pay

## 2016-05-21 DIAGNOSIS — M170 Bilateral primary osteoarthritis of knee: Secondary | ICD-10-CM

## 2016-05-21 MED ORDER — DICLOFENAC SODIUM 75 MG PO TBEC: 75.0000 mg | DELAYED_RELEASE_TABLET | Freq: Two times a day (BID) | ORAL | 1 refills | Status: DC

## 2016-06-27 ENCOUNTER — Other Ambulatory Visit: Payer: Self-pay | Admitting: Family Medicine

## 2016-06-27 DIAGNOSIS — M170 Bilateral primary osteoarthritis of knee: Secondary | ICD-10-CM

## 2016-06-27 MED ORDER — DICLOFENAC SODIUM 75 MG PO TBEC
75.0000 mg | DELAYED_RELEASE_TABLET | Freq: Two times a day (BID) | ORAL | 1 refills | Status: DC
Start: 2016-06-27 — End: 2016-08-26

## 2016-07-28 ENCOUNTER — Telehealth: Payer: Self-pay | Admitting: Family Medicine

## 2016-07-28 DIAGNOSIS — F411 Generalized anxiety disorder: Secondary | ICD-10-CM

## 2016-07-28 MED ORDER — ALPRAZOLAM 0.5 MG PO TABS: ORAL_TABLET | ORAL | 0 refills | Status: DC

## 2016-07-28 NOTE — Telephone Encounter (Signed)
Requesting:   Alprazolam Contract    03/25/2016 UDS  Low risk next is due on 09/22/16 Last OV     03/25/2016----future appt is oon 03/27/2017 Last Refill    #90 with 2 refills on 01/21/2016  Please Advise  Pharmacy is [facility] [address] [place]--fax number [phone]

## 2016-07-28 NOTE — Telephone Encounter (Signed)
Faxed hardcopy to [facility] [place].

## 2016-07-28 NOTE — Telephone Encounter (Signed)
Refill x1 

## 2016-08-26 ENCOUNTER — Other Ambulatory Visit: Payer: Self-pay | Admitting: Family Medicine

## 2016-08-26 DIAGNOSIS — M170 Bilateral primary osteoarthritis of knee: Secondary | ICD-10-CM

## 2016-08-26 MED ORDER — DICLOFENAC SODIUM 75 MG PO TBEC: 75.0000 mg | DELAYED_RELEASE_TABLET | Freq: Two times a day (BID) | ORAL | 1 refills | Status: DC

## 2016-09-02 ENCOUNTER — Telehealth: Payer: Self-pay | Admitting: Family Medicine

## 2016-09-02 DIAGNOSIS — F411 Generalized anxiety disorder: Secondary | ICD-10-CM

## 2016-09-02 MED ORDER — ALPRAZOLAM 0.5 MG PO TABS: ORAL_TABLET | ORAL | 0 refills | Status: DC

## 2016-09-02 NOTE — Telephone Encounter (Signed)
Requesting:   alprazolam Contract   03/25/2016 UDS   Low risk next is due on 09/22/2016 Last OV   03/25/2016----future appt is on 03/27/2017 Last Refill   #90 on 07/28/2016  Please Advise

## 2016-09-02 NOTE — Telephone Encounter (Signed)
database 

## 2016-09-02 NOTE — Telephone Encounter (Signed)
Database ran and 1 refill approved.  Rx called into pharmacy.

## 2016-10-14 ENCOUNTER — Telehealth: Payer: Self-pay | Admitting: *Deleted

## 2016-10-14 DIAGNOSIS — F411 Generalized anxiety disorder: Secondary | ICD-10-CM

## 2016-10-14 DIAGNOSIS — M170 Bilateral primary osteoarthritis of knee: Secondary | ICD-10-CM

## 2016-10-14 MED ORDER — ALPRAZOLAM 0.5 MG PO TABS: ORAL_TABLET | ORAL | 2 refills | Status: DC

## 2016-10-14 MED ORDER — DICLOFENAC SODIUM 75 MG PO TBEC: 75.0000 mg | DELAYED_RELEASE_TABLET | Freq: Two times a day (BID) | ORAL | 1 refills | Status: DC

## 2016-10-14 NOTE — Telephone Encounter (Signed)
Refill x1   2 refills 

## 2016-10-14 NOTE — Telephone Encounter (Signed)
[facility] requesting refill on alprazolam  Database ran and on your desk  Last filled per database:  09/03/16 Last written: 09/02/16 Last ov: 03/25/16 Next ov: 03/27/16 Contract: 03/25/16 UDS: Due now

## 2016-10-15 NOTE — Telephone Encounter (Signed)
rx called into [facility]

## 2017-01-01 ENCOUNTER — Encounter: Payer: Self-pay | Admitting: Family Medicine

## 2017-01-01 DIAGNOSIS — M170 Bilateral primary osteoarthritis of knee: Secondary | ICD-10-CM

## 2017-01-01 MED ORDER — DICLOFENAC SODIUM 75 MG PO TBEC: 75.0000 mg | DELAYED_RELEASE_TABLET | Freq: Two times a day (BID) | ORAL | 3 refills | Status: DC

## 2017-02-19 ENCOUNTER — Encounter: Payer: Self-pay | Admitting: Family Medicine

## 2017-02-19 DIAGNOSIS — F411 Generalized anxiety disorder: Secondary | ICD-10-CM

## 2017-02-20 MED ORDER — ALPRAZOLAM 0.5 MG PO TABS: ORAL_TABLET | ORAL | 2 refills | Status: DC

## 2017-02-20 NOTE — Telephone Encounter (Signed)
Pt is requesting refill on alprazolam.   Last OV: 04/23/2016 w/ Wendling Last Fill: 10/14/2016 #90 and 2RF  (Pt sig:1 tab tid prn) UDS: 03/25/2016 Low risk  NCCR printed; placed on ledge.  Please advise.

## 2017-02-20 NOTE — Telephone Encounter (Signed)
Rx sent electronically by PCP.

## 2017-02-20 NOTE — Telephone Encounter (Signed)
Please advise for refill request Last RX: 10/14/16 30 days with 2 refills Last OV: 04/23/16 Next OV:03/27/17 UDS: 03/26/16 CSC:03/21/14

## 2017-02-24 ENCOUNTER — Encounter: Payer: Self-pay | Admitting: Family Medicine

## 2017-03-27 ENCOUNTER — Ambulatory Visit (INDEPENDENT_AMBULATORY_CARE_PROVIDER_SITE_OTHER): Payer: BLUE CROSS/BLUE SHIELD | Admitting: Family Medicine

## 2017-03-27 ENCOUNTER — Ambulatory Visit (HOSPITAL_BASED_OUTPATIENT_CLINIC_OR_DEPARTMENT_OTHER)
Admission: RE | Admit: 2017-03-27 | Discharge: 2017-03-27 | Disposition: A | Discharge status: Home / Self Care | Payer: BLUE CROSS/BLUE SHIELD | Source: Ambulatory Visit | Attending: Family Medicine | Admitting: Family Medicine

## 2017-03-27 ENCOUNTER — Encounter: Payer: Self-pay | Admitting: Family Medicine

## 2017-03-27 VITALS — BP 136/86 | HR 84 | Temp 98.0°F | Resp 16 | Ht 59.6 in | Wt 175.6 lb

## 2017-03-27 DIAGNOSIS — M170 Bilateral primary osteoarthritis of knee: Secondary | ICD-10-CM

## 2017-03-27 DIAGNOSIS — M25561 Pain in right knee: Secondary | ICD-10-CM

## 2017-03-27 DIAGNOSIS — Z8669 Personal history of other diseases of the nervous system and sense organs: Secondary | ICD-10-CM

## 2017-03-27 DIAGNOSIS — H8110 Benign paroxysmal vertigo, unspecified ear: Secondary | ICD-10-CM

## 2017-03-27 DIAGNOSIS — Z0000 Encounter for general adult medical examination without abnormal findings: Secondary | ICD-10-CM

## 2017-03-27 DIAGNOSIS — I739 Peripheral vascular disease, unspecified: Secondary | ICD-10-CM | POA: Diagnosis not present

## 2017-03-27 DIAGNOSIS — J45909 Unspecified asthma, uncomplicated: Secondary | ICD-10-CM

## 2017-03-27 DIAGNOSIS — J0110 Acute frontal sinusitis, unspecified: Secondary | ICD-10-CM | POA: Diagnosis not present

## 2017-03-27 DIAGNOSIS — Z23 Encounter for immunization: Secondary | ICD-10-CM

## 2017-03-27 DIAGNOSIS — M79604 Pain in right leg: Secondary | ICD-10-CM | POA: Diagnosis not present

## 2017-03-27 DIAGNOSIS — F411 Generalized anxiety disorder: Secondary | ICD-10-CM | POA: Diagnosis not present

## 2017-03-27 DIAGNOSIS — R109 Unspecified abdominal pain: Secondary | ICD-10-CM

## 2017-03-27 DIAGNOSIS — Z0001 Encounter for general adult medical examination with abnormal findings: Secondary | ICD-10-CM | POA: Diagnosis not present

## 2017-03-27 DIAGNOSIS — Z1231 Encounter for screening mammogram for malignant neoplasm of breast: Secondary | ICD-10-CM | POA: Diagnosis present

## 2017-03-27 DIAGNOSIS — J324 Chronic pansinusitis: Secondary | ICD-10-CM

## 2017-03-27 DIAGNOSIS — M25562 Pain in left knee: Secondary | ICD-10-CM | POA: Diagnosis not present

## 2017-03-27 DIAGNOSIS — G8929 Other chronic pain: Secondary | ICD-10-CM

## 2017-03-27 DIAGNOSIS — Z1239 Encounter for other screening for malignant neoplasm of breast: Secondary | ICD-10-CM

## 2017-03-27 LAB — COMPREHENSIVE METABOLIC PANEL
ALT: 28 U/L (ref 0–35)
AST: 22 U/L (ref 0–37)
Albumin: 4.7 g/dL (ref 3.5–5.2)
Alkaline Phosphatase: 74 U/L (ref 39–117)
BUN: 16 mg/dL (ref 6–23)
CALCIUM: 9.8 mg/dL (ref 8.4–10.5)
CO2: 28 meq/L (ref 19–32)
CREATININE: 0.75 mg/dL (ref 0.40–1.20)
Chloride: 108 mEq/L (ref 96–112)
GFR: 86 mL/min (ref >60.00)
Glucose, Bld: 94 mg/dL (ref 70–99)
POTASSIUM: 4.3 meq/L (ref 3.5–5.1)
Sodium: 143 mEq/L (ref 135–145)
Total Bilirubin: 0.3 mg/dL (ref 0.2–1.2)
Total Protein: 7.6 g/dL (ref 6.0–8.3)

## 2017-03-27 LAB — CBC WITH DIFFERENTIAL/PLATELET
BASOS PCT: 1.1 % (ref 0.0–3.0)
Basophils Absolute: 0.1 10*3/uL (ref 0.0–0.1)
EOS ABS: 0.3 10*3/uL (ref 0.0–0.7)
EOS PCT: 4.1 % (ref 0.0–5.0)
HEMATOCRIT: 43.9 % (ref 36.0–46.0)
HEMOGLOBIN: 14.4 g/dL (ref 12.0–15.0)
LYMPHS PCT: 26.3 % (ref 12.0–46.0)
Lymphs Abs: 2.1 10*3/uL (ref 0.7–4.0)
MCHC: 32.9 g/dL (ref 30.0–36.0)
MCV: 89.8 fl (ref 78.0–100.0)
MONOS PCT: 6.4 % (ref 3.0–12.0)
Monocytes Absolute: 0.5 10*3/uL (ref 0.1–1.0)
NEUTROS ABS: 4.9 10*3/uL (ref 1.4–7.7)
Neutrophils Relative %: 62.1 % (ref 43.0–77.0)
PLATELETS: 326 10*3/uL (ref 150.0–400.0)
RBC: 4.89 Mil/uL (ref 3.87–5.11)
RDW: 14.1 % (ref 11.5–15.5)
WBC: 7.9 10*3/uL (ref 4.0–10.5)

## 2017-03-27 LAB — LIPID PANEL
CHOL/HDL RATIO: 4
Cholesterol: 187 mg/dL (ref 0–200)
HDL: 46.9 mg/dL (ref >39.00)
NONHDL: 139.83
Triglycerides: 203 mg/dL — ABNORMAL HIGH (ref 0.0–149.0)
VLDL: 40.6 mg/dL — ABNORMAL HIGH (ref 0.0–40.0)

## 2017-03-27 LAB — POC URINALSYSI DIPSTICK (AUTOMATED)
BILIRUBIN UA: NEGATIVE
Glucose, UA: NEGATIVE
Ketones, UA: NEGATIVE
Leukocytes, UA: NEGATIVE
NITRITE UA: NEGATIVE
Protein, UA: NEGATIVE
RBC UA: NEGATIVE
SPEC GRAV UA: 1.015 (ref 1.010–1.025)
UROBILINOGEN UA: 0.2 U/dL
pH, UA: 6 (ref 5.0–8.0)

## 2017-03-27 LAB — TSH: TSH: 0.95 u[IU]/mL (ref 0.35–4.50)

## 2017-03-27 LAB — LDL CHOLESTEROL, DIRECT: Direct LDL: 124 mg/dL

## 2017-03-27 MED ORDER — ALPRAZOLAM 0.5 MG PO TABS: ORAL_TABLET | ORAL | 2 refills | Status: DC

## 2017-03-27 MED ORDER — MOMETASONE FURO-FORMOTEROL FUM 200-5 MCG/ACT IN AERO: 2.0000 | INHALATION_SPRAY | Freq: Two times a day (BID) | RESPIRATORY_TRACT | 3 refills | Status: DC

## 2017-03-27 MED ORDER — SUMATRIPTAN SUCCINATE 100 MG PO TABS: ORAL_TABLET | ORAL | 1 refills | Status: DC

## 2017-03-27 MED ORDER — FLUTICASONE PROPIONATE 50 MCG/ACT NA SUSP: 2.0000 | Freq: Every day | NASAL | 6 refills | Status: DC

## 2017-03-27 MED ORDER — TRAMADOL HCL 50 MG PO TABS: 50.0000 mg | ORAL_TABLET | Freq: Three times a day (TID) | ORAL | 0 refills | Status: DC | PRN

## 2017-03-27 MED ORDER — LEVOFLOXACIN 500 MG PO TABS: 500.0000 mg | ORAL_TABLET | Freq: Every day | ORAL | 0 refills | Status: DC

## 2017-03-27 MED ORDER — TOPIRAMATE 50 MG PO TABS: 50.0000 mg | ORAL_TABLET | Freq: Two times a day (BID) | ORAL | 3 refills | Status: DC

## 2017-03-27 MED ORDER — MONTELUKAST SODIUM 10 MG PO TABS: 10.0000 mg | ORAL_TABLET | Freq: Every day | ORAL | 3 refills | Status: DC

## 2017-03-27 MED ORDER — ALBUTEROL SULFATE 108 (90 BASE) MCG/ACT IN AEPB: 2.0000 | INHALATION_SPRAY | Freq: Four times a day (QID) | RESPIRATORY_TRACT | 3 refills | Status: DC | PRN

## 2017-03-27 MED ORDER — DICLOFENAC SODIUM 75 MG PO TBEC: 75.0000 mg | DELAYED_RELEASE_TABLET | Freq: Two times a day (BID) | ORAL | 3 refills | Status: DC

## 2017-03-27 MED ORDER — CLARITHROMYCIN ER 500 MG PO TB24: 1000.0000 mg | ORAL_TABLET | Freq: Every day | ORAL | 0 refills | Status: DC

## 2017-03-27 MED ORDER — DICLOFENAC SODIUM 1 % TD GEL: 4.0000 g | Freq: Four times a day (QID) | TRANSDERMAL | 2 refills | Status: DC

## 2017-03-27 MED ORDER — MECLIZINE HCL 25 MG PO CHEW: CHEWABLE_TABLET | ORAL | 2 refills | Status: DC

## 2017-03-27 NOTE — Patient Instructions (Signed)
Preventive Care 40-64 Years, Female Preventive care refers to lifestyle choices and visits with your health care provider that can promote health and wellness. What does preventive care include?  A yearly physical exam. This is also called an annual well check.  Dental exams once or twice a year.  Routine eye exams. Ask your health care provider how often you should have your eyes checked.  Personal lifestyle choices, including: ? Daily care of your teeth and gums. ? Regular physical activity. ? Eating a healthy diet. ? Avoiding tobacco and drug use. ? Limiting alcohol use. ? Practicing safe sex. ? Taking low-dose aspirin daily starting at age 58. ? Taking vitamin and mineral supplements as recommended by your health care provider. What happens during an annual well check? The services and screenings done by your health care provider during your annual well check will depend on your age, overall health, lifestyle risk factors, and family history of disease. Counseling Your health care provider may ask you questions about your:  Alcohol use.  Tobacco use.  Drug use.  Emotional well-being.  Home and relationship well-being.  Sexual activity.  Eating habits.  Work and work Statistician.  Method of birth control.  Menstrual cycle.  Pregnancy history.  Screening You may have the following tests or measurements:  Height, weight, and BMI.  Blood pressure.  Lipid and cholesterol levels. These may be checked every 5 years, or more frequently if you are over 81 years old.  Skin check.  Lung cancer screening. You may have this screening every year starting at age 78 if you have a 30-pack-year history of smoking and currently smoke or have quit within the past 15 years.  Fecal occult blood test (FOBT) of the stool. You may have this test every year starting at age 65.  Flexible sigmoidoscopy or colonoscopy. You may have a sigmoidoscopy every 5 years or a colonoscopy  every 10 years starting at age 30.  Hepatitis C blood test.  Hepatitis B blood test.  Sexually transmitted disease (STD) testing.  Diabetes screening. This is done by checking your blood sugar (glucose) after you have not eaten for a while (fasting). You may have this done every 1-3 years.  Mammogram. This may be done every 1-2 years. Talk to your health care provider about when you should start having regular mammograms. This may depend on whether you have a family history of breast cancer.  BRCA-related cancer screening. This may be done if you have a family history of breast, ovarian, tubal, or peritoneal cancers.  Pelvic exam and Pap test. This may be done every 3 years starting at age 80. Starting at age 36, this may be done every 5 years if you have a Pap test in combination with an HPV test.  Bone density scan. This is done to screen for osteoporosis. You may have this scan if you are at high risk for osteoporosis.  Discuss your test results, treatment options, and if necessary, the need for more tests with your health care provider. Vaccines Your health care provider may recommend certain vaccines, such as:  Influenza vaccine. This is recommended every year.  Tetanus, diphtheria, and acellular pertussis (Tdap, Td) vaccine. You may need a Td booster every 10 years.  Varicella vaccine. You may need this if you have not been vaccinated.  Zoster vaccine. You may need this after age 5.  Measles, mumps, and rubella (MMR) vaccine. You may need at least one dose of MMR if you were born in  1958 or later. You may also need a second dose.  Pneumococcal 13-valent conjugate (PCV13) vaccine. You may need this if you have certain conditions and were not previously vaccinated.  Pneumococcal polysaccharide (PPSV23) vaccine. You may need one or two doses if you smoke cigarettes or if you have certain conditions.  Meningococcal vaccine. You may need this if you have certain  conditions.  Hepatitis A vaccine. You may need this if you have certain conditions or if you travel or work in places where you may be exposed to hepatitis A.  Hepatitis B vaccine. You may need this if you have certain conditions or if you travel or work in places where you may be exposed to hepatitis B.  Haemophilus influenzae type b (Hib) vaccine. You may need this if you have certain conditions.  Talk to your health care provider about which screenings and vaccines you need and how often you need them. This information is not intended to replace advice given to you by your health care provider. Make sure you discuss any questions you have with your health care provider. Document Released: 03/30/2015 Document Revised: 11/21/2015 Document Reviewed: 01/02/2015 Elsevier Interactive Patient Education  2018 [employer]  

## 2017-03-27 NOTE — Progress Notes (Signed)
Subjective:     Deborah Watts is a 52 y.o. female and is here for a comprehensive physical exam. The patient reports problems - + breathing problems and ears hurt and head is pounding.  she took a z pack she had left over .Marland Kitchen.  Pt has been taking mucinex fast max ---- with no relief.   Pt also c/o knee and R leg pain with standing and walking.   Social History   Socioeconomic History  . Marital status: Married    Spouse name: Not on file  . Number of children: Not on file  . Years of education: Not on file  . Highest education level: Not on file  Social Needs  . Financial resource strain: Not on file  . Food insecurity - worry: Not on file  . Food insecurity - inability: Not on file  . Transportation needs - medical: Not on file  . Transportation needs - non-medical: Not on file  Occupational History  . Occupation: Administrator, [employer]    Employer: [employer] [place]  Tobacco Use  . Smoking status: Never Smoker  . Smokeless tobacco: Never Used  Substance and Sexual Activity  . Alcohol use: Yes    Alcohol/week: 0.0 oz    Comment: occassional  . Drug use: No  . Sexual activity: Yes    Partners: Male  Other Topics Concern  . Not on file  Social History Narrative   Exercise-- no   Health Maintenance  Topic Date Due  . MAMMOGRAM  01/12/2014  . COLONOSCOPY  06/28/2014  . TETANUS/TDAP  12/24/2023  . INFLUENZA VACCINE  Completed  . HIV Screening  Completed    The following portions of the patient's history were reviewed and updated as appropriate:  She  has a past medical history of Anxiety, Asthma, Hypertension, and MHA (microangiopathic hemolytic anemia) (HCC). She does not have any pertinent problems on file. She  has a past surgical history that includes Total abdominal hysterectomy (Right); Laparoscopic cholecystectomy (N/A, 03/1992); and Appendectomy (N/A). Her family history includes Asthma in her unknown relative; Diabetes in her unknown relative; Hyperlipidemia in her unknown  relative; Throat cancer in her father. She  reports that  has never smoked. she has never used smokeless tobacco. She reports that she drinks alcohol. She reports that she does not use drugs. She has a current medication list which includes the following prescription(s): albuterol sulfate, alprazolam, cholecalciferol, diclofenac, fluticasone, loratadine, magnesium, meclizine hcl, mometasone-formoterol, montelukast, nitroglycerin, sumatriptan, topiramate, tramadol, diclofenac sodium, and levofloxacin. Current Outpatient Medications on File Prior to Visit  Medication Sig Dispense Refill  . Cholecalciferol (VITAMIN D-400 PO) Take 1 capsule by mouth daily.    Marland Kitchen. loratadine (CLARITIN) 10 MG tablet Take 10 mg by mouth daily.    . Magnesium 250 MG TABS Take 1 tablet by mouth daily.    . nitroGLYCERIN (NITRODUR - DOSED IN MG/24 HR) 0.2 mg/hr patch Apply 1/4th patch to affected ankle, change daily 30 patch 1   No current facility-administered medications on file prior to visit.    She is allergic to fish allergy; penicillins; iohexol; and ivp dye [iodinated diagnostic agents]..  Review of Systems Review of Systems  Constitutional: Negative for activity change, appetite change and fatigue.  HENT: Negative for hearing loss, , tinnitus and ear discharge.  dentist [id number] + congestion Eyes: Negative for visual disturbance (see optho q1y -- vision corrected to 20/20 with glasses).  Respiratory: Negative for cough, + chest tightness and shortness of breath.   Cardiovascular: Negative  for chest pain, palpitations and leg swelling.  Gastrointestinal: Negative for abdominal pain, diarrhea, constipation and abdominal distention.  Genitourinary: Negative for urgency, frequency, decreased urine volume and difficulty urinating.  Musculoskeletal: Negative for back pain, + R leg pain and b/l knee pain Skin: Negative for color change, pallor and rash.  Neurological: Negative for dizziness, light-headedness, numbness and  headaches.  Hematological: Negative for adenopathy. Does not bruise/bleed easily.  Psychiatric/Behavioral: Negative for suicidal ideas, confusion, sleep disturbance, self-injury, dysphoric mood, decreased concentration and agitation.      Objective:    BP 136/86 (BP Location: Left Arm, Cuff Size: Normal)   Pulse 84   Temp 98 F (36.7 C) (Oral)   Resp 16   Ht 4' 11.6" (1.514 m)   Wt 175 lb 9.6 oz (79.7 kg)   SpO2 97%   BMI 34.76 kg/m  General appearance: alert, cooperative, appears stated age and no distress Head: Normocephalic, without obvious abnormality, atraumatic Eyes: conjunctivae/corneas clear. PERRL, EOM's intact. Fundi benign. Ears: normal TM's and external ear canals both ears Nose: Nares normal. Septum midline. Mucosa normal. No drainage or sinus tenderness. Throat: lips, mucosa, and tongue normal; teeth and gums normal Neck: no adenopathy, no carotid bruit, no JVD, supple, symmetrical, trachea midline and thyroid not enlarged, symmetric, no tenderness/mass/nodules Back: symmetric, no curvature. ROM normal. No CVA tenderness. Lungs: clear to auscultation bilaterally Breasts: normal appearance, no masses or tenderness Heart: regular rate and rhythm, S1, S2 normal, no murmur, click, rub or gallop Abdomen: soft, non-tender; bowel sounds normal; no masses,  no organomegaly Pelvic: not indicated; status post hysterectomy, negative ROS Extremities: + crepitus in knee b/l ,  No pain R leg with palpation now Pulses: 2+ and symmetric Skin: Skin color, texture, turgor normal. No rashes or lesions Lymph nodes: Cervical, supraclavicular, and axillary nodes normal. Neurologic: Alert and oriented X 3, normal strength and tone. Normal symmetric reflexes. Normal coordination and gait    Assessment:    Healthy female exam.      Plan:    ghm utd Check labs See After Visit Summary for Counseling Recommendations     1. Preventative health care Check labs  - POCT Urinalysis  Dipstick (Automated) - Comprehensive metabolic panel - CBC with Differential/Platelet - Lipid panel - TSH - Ambulatory referral to Gastroenterology  2. Chronic pain of both knees Refer to sport med - Ambulatory referral to Sports Medicine - diclofenac sodium (VOLTAREN) 1 % GEL; Apply 4 g topically 4 (four) times daily.  Dispense: 1 Tube; Refill: 2  3. Leg pain, anterior, right  - Ambulatory referral to Sports Medicine  4. Intermittent claudication (HCC)  - [place] Lower Ext Art Bilat; Future  5. Pansinusitis, unspecified chronicity Levaquin, flonase  rto prn  -   6. Acute frontal sinusitis, recurrence not specified abx per orders - fluticasone (FLONASE) 50 MCG/ACT nasal spray; Place 2 sprays into both nostrils daily.  Dispense: 16 g; Refill: 6  7. Asthma, chronic, unspecified asthma severity, uncomplicated  - Albuterol Sulfate (PROAIR RESPICLICK) 108 (90 Base) MCG/ACT AEPB; Inhale 2 puffs into the lungs every 6 (six) hours as needed.  Dispense: 3 each; Refill: 3 - mometasone-formoterol (DULERA) 200-5 MCG/ACT AERO; Inhale 2 puffs into the lungs 2 (two) times daily.  Dispense: 3 Inhaler; Refill: 3 - montelukast (SINGULAIR) 10 MG tablet; Take 1 tablet (10 mg total) by mouth at bedtime.  Dispense: 90 tablet; Refill: 3  8. Hx of migraines  - SUMAtriptan (IMITREX) 100 MG tablet; TAKE 1 TABLET BY MOUTH  AS NEEDED FOR MIGRAINE AND REPEAT IN 2 HOURS AS NEEDED  Dispense: 12 tablet; Refill: 1 - topiramate (TOPAMAX) 50 MG tablet; Take 1 tablet (50 mg total) by mouth 2 (two) times daily.  Dispense: 180 tablet; Refill: 3  9. Primary osteoarthritis of both knees  - diclofenac (VOLTAREN) 75 MG EC tablet; Take 1 tablet (75 mg total) by mouth 2 (two) times daily.  Dispense: 60 tablet; Refill: 3  10. Generalized anxiety disorder  - ALPRAZolam (XANAX) 0.5 MG tablet; TAKE ONE TABLET BY MOUTH THREE TIMES DAILY FOR SLEEP OR ANXIETY  Dispense: 90 tablet; Refill: 2  11. Benign paroxysmal positional  vertigo, unspecified laterality  - Meclizine HCl (TRAVEL SICKNESS) 25 MG CHEW; CHEW 1 TABLET EVERY 6 HOURS AS NEEDED  Dispense: 45 each; Refill: 2  12. Abdominal pain, unspecified abdominal location  - traMADol (ULTRAM) 50 MG tablet; Take 1 tablet (50 mg total) by mouth every 8 (eight) hours as needed.  Dispense: 30 tablet; Refill: 0

## 2017-04-09 ENCOUNTER — Encounter: Payer: Self-pay | Admitting: Family Medicine

## 2017-04-09 ENCOUNTER — Ambulatory Visit (INDEPENDENT_AMBULATORY_CARE_PROVIDER_SITE_OTHER): Payer: BLUE CROSS/BLUE SHIELD | Admitting: Family Medicine

## 2017-04-09 DIAGNOSIS — G8929 Other chronic pain: Secondary | ICD-10-CM

## 2017-04-09 DIAGNOSIS — M25561 Pain in right knee: Secondary | ICD-10-CM | POA: Diagnosis not present

## 2017-04-09 DIAGNOSIS — M79605 Pain in left leg: Secondary | ICD-10-CM | POA: Diagnosis not present

## 2017-04-09 MED ORDER — METHYLPREDNISOLONE ACETATE 40 MG/ML IJ SUSP
40.0000 mg | Freq: Once | INTRAMUSCULAR | Status: AC
  Administered 2017-04-09: 40 mg via INTRA_ARTICULAR

## 2017-04-09 NOTE — Patient Instructions (Signed)
Your knee pain is due to arthritis. These are the different medications you can take for this: Tylenol 500mg  1-2 tabs three times a day for pain. Capsaicin, aspercreme, or biofreeze topically up to four times a day may also help with pain. Some supplements that may help for arthritis: Boswellia extract, curcumin, pycnogenol Voltaren twice a day with food for pain and inflammation. Cortisone injections are an option - you were given one of these today in the right knee. If cortisone injections do not help, there are different types of shots that may help but they take longer to take effect. It's important that you continue to stay active. Straight leg raises, knee extensions 3 sets of 10 once a day (add ankle weight if these become too easy). Consider physical therapy to strengthen muscles around the joint that hurts to take pressure off of the joint itself. Shoe inserts with good arch support may be helpful. Heat or ice 15 minutes at a time 3-4 times a day as needed to help with pain. Water aerobics and cycling with low resistance are the best two types of exercise for arthritis though any exercise is ok as long as it doesn't worsen the pain. Follow up with me in 6 weeks.  You have a hamstring strain. Wear compression sleeve when up and walking around for next 6 weeks if tolerated. Voltaren as noted above. Heat 15 minutes at a time 3-4 times a day. Leg curls, hamstring swings, running lunges - add 2 pound weight with time if these are too easy. 3 sets of 10 once or twice a day. Consider physical therapy as well. Follow up with me in 6 weeks.

## 2017-04-12 ENCOUNTER — Encounter: Payer: Self-pay | Admitting: Family Medicine

## 2017-04-12 DIAGNOSIS — M25561 Pain in right knee: Secondary | ICD-10-CM | POA: Insufficient documentation

## 2017-04-12 NOTE — Progress Notes (Signed)
PCP and consultation requested by: Donato SchultzLowne Chase, Yvonne R, DO  Subjective:   HPI: Patient is a 52 y.o. female here for bilateral knee pain.  Patient reports she's had surgery previously on both knees Pain worsened both knees especially on the right for past several months. Pain is anterior, associated with popping on the right. Associated swelling. Pain 2/10 currently but up to 8/10 and sharp by end of day. Left knee pain is present but also hurting in back of left thigh at 3/10 level. Tried voltaren gel and tablets without much benefit. Takes vitamin D and turmeric. No skin changes, numbness. Sleeve helps.  Past Medical History:  Diagnosis Date  . Anxiety   . Asthma   . Hypertension   . MHA (microangiopathic hemolytic anemia) (HCC)     Current Outpatient Medications on File Prior to Visit  Medication Sig Dispense Refill  . Albuterol Sulfate (PROAIR RESPICLICK) 108 (90 Base) MCG/ACT AEPB Inhale 2 puffs into the lungs every 6 (six) hours as needed. 3 each 3  . ALPRAZolam (XANAX) 0.5 MG tablet TAKE ONE TABLET BY MOUTH THREE TIMES DAILY FOR SLEEP OR ANXIETY 90 tablet 2  . Cholecalciferol (VITAMIN D-400 PO) Take 1 capsule by mouth daily.    . diclofenac (VOLTAREN) 75 MG EC tablet Take 1 tablet (75 mg total) by mouth 2 (two) times daily. 60 tablet 3  . diclofenac sodium (VOLTAREN) 1 % GEL Apply 4 g topically 4 (four) times daily. 1 Tube 2  . fluticasone (FLONASE) 50 MCG/ACT nasal spray Place 2 sprays into both nostrils daily. 16 g 6  . levofloxacin (LEVAQUIN) 500 MG tablet Take 1 tablet (500 mg total) by mouth daily. 7 tablet 0  . loratadine (CLARITIN) 10 MG tablet Take 10 mg by mouth daily.    . Magnesium 250 MG TABS Take 1 tablet by mouth daily.    . Meclizine HCl (TRAVEL SICKNESS) 25 MG CHEW CHEW 1 TABLET EVERY 6 HOURS AS NEEDED 45 each 2  . mometasone-formoterol (DULERA) 200-5 MCG/ACT AERO Inhale 2 puffs into the lungs 2 (two) times daily. 3 Inhaler 3  . montelukast (SINGULAIR)  10 MG tablet Take 1 tablet (10 mg total) by mouth at bedtime. 90 tablet 3  . nitroGLYCERIN (NITRODUR - DOSED IN MG/24 HR) 0.2 mg/hr patch Apply 1/4th patch to affected ankle, change daily 30 patch 1  . SUMAtriptan (IMITREX) 100 MG tablet TAKE 1 TABLET BY MOUTH AS NEEDED FOR MIGRAINE AND REPEAT IN 2 HOURS AS NEEDED 12 tablet 1  . topiramate (TOPAMAX) 50 MG tablet Take 1 tablet (50 mg total) by mouth 2 (two) times daily. 180 tablet 3  . traMADol (ULTRAM) 50 MG tablet Take 1 tablet (50 mg total) by mouth every 8 (eight) hours as needed. 30 tablet 0   No current facility-administered medications on file prior to visit.     Past Surgical History:  Procedure Laterality Date  . APPENDECTOMY N/A   . LAPAROSCOPIC CHOLECYSTECTOMY N/A 03/1992  . TOTAL ABDOMINAL HYSTERECTOMY Right     Allergies  Allergen Reactions  . Fish Allergy Hives and Swelling  . Penicillins Anaphylaxis and Hives  . Iohexol      Desc: hives, sob, pt. needs 13 hr prep   01/16/05   . Ivp Dye [Iodinated Diagnostic Agents] Hives and Swelling    Social History   Socioeconomic History  . Marital status: Married    Spouse name: Not on file  . Number of children: Not on file  . Years of  education: Not on file  . Highest education level: Not on file  Social Needs  . Financial resource strain: Not on file  . Food insecurity - worry: Not on file  . Food insecurity - inability: Not on file  . Transportation needs - medical: Not on file  . Transportation needs - non-medical: Not on file  Occupational History  . 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 Smoking status: Never Smoker  . Smokeless tobacco: Never Used  Substance and Sexual Activity  . Alcohol use: Yes    Alcohol/week: 0.0 oz    Comment: occassional  . Drug use: No  . Sexual activity: Yes    Partners: Male  Other Topics Concern  . Not on file  Social History Narrative   Exercise-- no    Family History  Problem Relation  Age of Onset  . Throat cancer Father   . Asthma Unknown   . Diabetes Unknown   . Hyperlipidemia Unknown     BP 139/84   Pulse 70   Ht 4\' 11"  (1.499 m)   Wt 174 lb (78.9 kg)   BMI 35.14 kg/m   Review of Systems: See HPI above.     Objective:  Physical Exam:  Gen: NAD, comfortable in exam room  Right knee: No gross deformity, ecchymoses, effusion. TTP medial joint line and post patellar facets mildly. FROM with 5/5 strength. Negative ant/post drawers. Negative valgus/varus testing. Negative lachmanns. Negative mcmurrays, apleys, patellar apprehension. NV intact distally.  Left knee/leg: No gross deformity, ecchymoses, effusion. Mild TTP medial mid-hamstring.  No other tenderness. FROM with 5/5 strength except with knee flexion at 30 degrees - 4/5 strength and painful. Negative ant/post drawers. Negative valgus/varus testing. Negative lachmanns. Negative mcmurrays, apleys, patellar apprehension. NV intact distally.   Assessment & Plan:  1. Right knee pain - 2/2 arthritis.  Discussed tylenol, topical medications, supplements, voltaren.  Injection given today.  Reviewed home exercises.  Ice/heat.  Fu in 6 weeks.  After informed written consent timeout was performed, patient was seated on exam table. Right knee was prepped with alcohol swab and utilizing anteromedial approach, patient's right knee was injected intraarticularly with 3:1 bupivicaine: depomedrol. Patient tolerated the procedure well without immediate complications.  2. Left hamstring strain - shown home exercises to do daily.  Compression sleeve, voltaren, heat.  Consider PT.  F/u in 6 weeks.

## 2017-04-12 NOTE — Assessment & Plan Note (Signed)
Left hamstring strain - shown home exercises to do daily.  Compression sleeve, voltaren, heat.  Consider PT.  F/u in 6 weeks.

## 2017-04-12 NOTE — Assessment & Plan Note (Signed)
2/2 arthritis.  Discussed tylenol, topical medications, supplements, voltaren.  Injection given today.  Reviewed home exercises.  Ice/heat.  Fu in 6 weeks.  After informed written consent timeout was performed, patient was seated on exam table. Right knee was prepped with alcohol swab and utilizing anteromedial approach, patient's right knee was injected intraarticularly with 3:1 bupivicaine: depomedrol. Patient tolerated the procedure well without immediate complications.

## 2017-05-21 ENCOUNTER — Encounter: Payer: Self-pay | Admitting: Family Medicine

## 2017-05-21 ENCOUNTER — Ambulatory Visit (INDEPENDENT_AMBULATORY_CARE_PROVIDER_SITE_OTHER): Payer: BLUE CROSS/BLUE SHIELD | Admitting: Family Medicine

## 2017-05-21 DIAGNOSIS — M25561 Pain in right knee: Secondary | ICD-10-CM

## 2017-05-21 DIAGNOSIS — G8929 Other chronic pain: Secondary | ICD-10-CM | POA: Diagnosis not present

## 2017-05-21 NOTE — Patient Instructions (Signed)
You're doing great! Continue with the home exercises 3-4 times a week for next 6 weeks. Continue boswellia and turmeric. Call me if you have any problems otherwise follow up as needed.

## 2017-05-22 ENCOUNTER — Encounter: Payer: Self-pay | Admitting: Family Medicine

## 2017-05-22 NOTE — Progress Notes (Signed)
PCP and consultation requested by: Donato Schultz, DO  Subjective:   HPI: Patient is a 52 y.o. female here for bilateral knee pain.  1/24: Patient reports she's had surgery previously on both knees Pain worsened both knees especially on the right for past several months. Pain is anterior, associated with popping on the right. Associated swelling. Pain 2/10 currently but up to 8/10 and sharp by end of day. Left knee pain is present but also hurting in back of left thigh at 3/10 level. Tried voltaren gel and tablets without much benefit. Takes vitamin D and turmeric. No skin changes, numbness. Sleeve helps.  3/7: Patient reports she is doing well. Not noting as much swelling. She still does get some pain bilaterally with weather changes. She is taking turmeric and boswellia extract.  no skin changes or numbness. Past Medical History:  Diagnosis Date  . Anxiety   . Asthma   . Hypertension   . MHA (microangiopathic hemolytic anemia) (HCC)     Current Outpatient Medications on File Prior to Visit  Medication Sig Dispense Refill  . Albuterol Sulfate (PROAIR RESPICLICK) 108 (90 Base) MCG/ACT AEPB Inhale 2 puffs into the lungs every 6 (six) hours as needed. 3 each 3  . ALPRAZolam (XANAX) 0.5 MG tablet TAKE ONE TABLET BY MOUTH THREE TIMES DAILY FOR SLEEP OR ANXIETY 90 tablet 2  . Cholecalciferol (VITAMIN D-400 PO) Take 1 capsule by mouth daily.    . diclofenac (VOLTAREN) 75 MG EC tablet Take 1 tablet (75 mg total) by mouth 2 (two) times daily. 60 tablet 3  . diclofenac sodium (VOLTAREN) 1 % GEL Apply 4 g topically 4 (four) times daily. 1 Tube 2  . fluticasone (FLONASE) 50 MCG/ACT nasal spray Place 2 sprays into both nostrils daily. 16 g 6  . levofloxacin (LEVAQUIN) 500 MG tablet Take 1 tablet (500 mg total) by mouth daily. 7 tablet 0  . loratadine (CLARITIN) 10 MG tablet Take 10 mg by mouth daily.    . Magnesium 250 MG TABS Take 1 tablet by mouth daily.    . Meclizine HCl  (TRAVEL SICKNESS) 25 MG CHEW CHEW 1 TABLET EVERY 6 HOURS AS NEEDED 45 each 2  . mometasone-formoterol (DULERA) 200-5 MCG/ACT AERO Inhale 2 puffs into the lungs 2 (two) times daily. 3 Inhaler 3  . montelukast (SINGULAIR) 10 MG tablet Take 1 tablet (10 mg total) by mouth at bedtime. 90 tablet 3  . nitroGLYCERIN (NITRODUR - DOSED IN MG/24 HR) 0.2 mg/hr patch Apply 1/4th patch to affected ankle, change daily 30 patch 1  . SUMAtriptan (IMITREX) 100 MG tablet TAKE 1 TABLET BY MOUTH AS NEEDED FOR MIGRAINE AND REPEAT IN 2 HOURS AS NEEDED 12 tablet 1  . topiramate (TOPAMAX) 50 MG tablet Take 1 tablet (50 mg total) by mouth 2 (two) times daily. 180 tablet 3  . traMADol (ULTRAM) 50 MG tablet Take 1 tablet (50 mg total) by mouth every 8 (eight) hours as needed. 30 tablet 0   No current facility-administered medications on file prior to visit.     Past Surgical History:  Procedure Laterality Date  . APPENDECTOMY N/A   . LAPAROSCOPIC CHOLECYSTECTOMY N/A 03/1992  . TOTAL ABDOMINAL HYSTERECTOMY Right     Allergies  Allergen Reactions  . Fish Allergy Hives and Swelling  . Penicillins Anaphylaxis and Hives  . Iohexol      Desc: hives, sob, pt. needs 13 hr prep   01/16/05   . Ivp Dye [Iodinated Diagnostic Agents] Hives  and Swelling    Social History   Socioeconomic History  . Marital status: Married    Spouse name: Not on file  . Number of children: Not on file  . Years of education: Not on file  . Highest education level: Not on file  Social Needs  . Financial resource strain: Not on file  . Food insecurity - worry: Not on file  . Food insecurity - inability: Not on file  . Transportation needs - medical: Not on file  . Transportation needs - non-medical: Not on file  Occupational History  . Occupation: Administrator, [employer]    Employer: [employer] [place]  Tobacco Use  . Smoking status: Never Smoker  . Smokeless tobacco: Never Used  Substance and Sexual Activity  . Alcohol use: Yes     Alcohol/week: 0.0 oz    Comment: occassional  . Drug use: No  . Sexual activity: Yes    Partners: Male  Other Topics Concern  . Not on file  Social History Narrative   Exercise-- no    Family History  Problem Relation Age of Onset  . Throat cancer Father   . Asthma Unknown   . Diabetes Unknown   . Hyperlipidemia Unknown     BP 118/73   Ht 4\' 11"  (1.499 m)   Wt 174 lb (78.9 kg)   BMI 35.14 kg/m   Review of Systems: See HPI above.     Objective:  Physical Exam:  Gen: NAD, comfortable in exam room.  Right knee: No gross deformity, ecchymoses, swelling. No TTP. FROM. Negative ant/post drawers. Negative valgus/varus testing. Negative lachmanns. Negative mcmurrays, apleys, patellar apprehension. NV intact distally.   Assessment & Plan:  1. Right knee pain - 2/2 arthritis.  Continue with boswellia extract, turmeric.  Discussed Tylenol, topical medications, Voltaren.  F/u prn.

## 2017-05-22 NOTE — Assessment & Plan Note (Signed)
2/2 arthritis.  Continue with boswellia extract, turmeric.  Discussed Tylenol, topical medications, Voltaren.  F/u prn.

## 2017-05-25 ENCOUNTER — Encounter: Payer: Self-pay | Admitting: Family Medicine

## 2017-05-25 ENCOUNTER — Ambulatory Visit (INDEPENDENT_AMBULATORY_CARE_PROVIDER_SITE_OTHER): Payer: BLUE CROSS/BLUE SHIELD | Admitting: Family Medicine

## 2017-05-25 VITALS — BP 110/78 | HR 71 | Temp 97.6°F | Ht 59.0 in | Wt 171.2 lb

## 2017-05-25 DIAGNOSIS — J0100 Acute maxillary sinusitis, unspecified: Secondary | ICD-10-CM | POA: Diagnosis not present

## 2017-05-25 MED ORDER — METHYLPREDNISOLONE ACETATE 80 MG/ML IJ SUSP
80.0000 mg | Freq: Once | INTRAMUSCULAR | Status: AC
  Administered 2017-05-25: 80 mg via INTRAMUSCULAR

## 2017-05-25 MED ORDER — DOXYCYCLINE HYCLATE 100 MG PO TABS: 100.0000 mg | ORAL_TABLET | Freq: Two times a day (BID) | ORAL | 0 refills | Status: DC

## 2017-05-25 NOTE — Progress Notes (Signed)
Chief Complaint  Patient presents with  . Cough  . Generalized Body Aches  . Ear Pain    Deborah Watts here for URI complaints.  Duration: around 1 week, progressively worsening over past 3 days Associated symptoms: sinus congestion, sinus pain, ear pain, sore throat and cough Denies: rhinorrhea, itchy watery eyes, ear drainage, wheezing, myalgia and fevers Treatment to date: Mucinex, Tylenol Cold and Flu Sick contacts: Yes  ROS:  Const: Denies fevers HEENT: As noted in HPI Lungs: No SOB  Past Medical History:  Diagnosis Date  . Anxiety   . Asthma   . Hypertension   . MHA (microangiopathic hemolytic anemia) (HCC)      BP 110/78 (BP Location: Left Arm, Patient Position: Sitting, Cuff Size: Normal)   Pulse 71   Temp 97.6 F (36.4 C) (Oral)   Ht 4\' 11"  (1.499 m)   Wt 171 lb 4 oz (77.7 kg)   SpO2 98%   BMI 34.59 kg/m  General: Awake, alert, appears stated age HEENT: AT, [place], ears patent b/l and TM's neg, max sinuses ttp b/l, nares patent w/o discharge, pharynx pink and without exudates, MMM Neck: No masses or asymmetry Heart: RRR Lungs: CTAB, no accessory muscle use Psych: Age appropriate judgment and insight, normal mood and affect  Acute maxillary sinusitis, recurrence not specified - Plan: doxycycline (VIBRA-TABS) 100 MG tablet  Orders as above. Worsening over past 3 days, will tx. Depo injection to help with s/s's. She was advised to rinse mouth out after use of LABA/ICS to prevent thrush. Continue to push fluids, practice good hand hygiene, cover mouth when coughing. F/u prn. If starting to experience fevers, shaking, or shortness of breath, seek immediate care. Pt voiced understanding and agreement to the plan.  Jilda Rocheicholas Paul [place], DO 05/25/17 1:10 PM

## 2017-05-25 NOTE — Patient Instructions (Addendum)
Continue to push fluids, practice good hand hygiene, and cover your mouth if you cough.  If you start having fevers, shaking or shortness of breath, seek immediate care.  Rinse out your mouth after using the [facility]. This will prevent thrush.  Let us know if you need anything.

## 2017-05-25 NOTE — Addendum Note (Signed)
Addended by: Scharlene GlossEWING, ROBIN B on: 05/25/2017 01:29 PM   Modules accepted: Orders

## 2017-05-25 NOTE — Progress Notes (Signed)
Pre visit review using our clinic review tool, if applicable. No additional management support is needed unless otherwise documented below in the visit note. 

## 2017-08-27 ENCOUNTER — Other Ambulatory Visit: Payer: Self-pay | Admitting: Family Medicine

## 2017-08-27 ENCOUNTER — Encounter: Payer: Self-pay | Admitting: Family Medicine

## 2017-08-27 MED ORDER — FLUCONAZOLE 150 MG PO TABS: ORAL_TABLET | ORAL | 2 refills | Status: DC

## 2017-08-27 NOTE — Telephone Encounter (Signed)
Do you want her to come in?

## 2017-09-08 ENCOUNTER — Encounter: Payer: Self-pay | Admitting: Family Medicine

## 2017-09-08 DIAGNOSIS — M25562 Pain in left knee: Secondary | ICD-10-CM

## 2017-09-08 DIAGNOSIS — M170 Bilateral primary osteoarthritis of knee: Secondary | ICD-10-CM

## 2017-09-08 DIAGNOSIS — M25561 Pain in right knee: Secondary | ICD-10-CM

## 2017-09-08 DIAGNOSIS — G8929 Other chronic pain: Secondary | ICD-10-CM

## 2017-09-08 MED ORDER — DICLOFENAC SODIUM 1 % TD GEL: 4.0000 g | Freq: Four times a day (QID) | TRANSDERMAL | 2 refills | Status: DC

## 2017-09-08 MED ORDER — DICLOFENAC SODIUM 75 MG PO TBEC: 75.0000 mg | DELAYED_RELEASE_TABLET | Freq: Two times a day (BID) | ORAL | 3 refills | Status: DC

## 2017-09-10 ENCOUNTER — Ambulatory Visit (INDEPENDENT_AMBULATORY_CARE_PROVIDER_SITE_OTHER): Payer: BLUE CROSS/BLUE SHIELD | Admitting: Family Medicine

## 2017-09-10 ENCOUNTER — Encounter: Payer: Self-pay | Admitting: Family Medicine

## 2017-09-10 VITALS — BP 136/78 | HR 69 | Temp 98.6°F | Resp 16 | Ht 59.0 in | Wt 164.4 lb

## 2017-09-10 DIAGNOSIS — R1013 Epigastric pain: Secondary | ICD-10-CM | POA: Diagnosis not present

## 2017-09-10 DIAGNOSIS — F419 Anxiety disorder, unspecified: Secondary | ICD-10-CM | POA: Diagnosis not present

## 2017-09-10 DIAGNOSIS — Z8669 Personal history of other diseases of the nervous system and sense organs: Secondary | ICD-10-CM | POA: Diagnosis not present

## 2017-09-10 LAB — CBC WITH DIFFERENTIAL/PLATELET
BASOS ABS: 0.1 10*3/uL (ref 0.0–0.1)
BASOS PCT: 1 % (ref 0.0–3.0)
EOS ABS: 0.3 10*3/uL (ref 0.0–0.7)
Eosinophils Relative: 3 % (ref 0.0–5.0)
HEMATOCRIT: 43.2 % (ref 36.0–46.0)
Hemoglobin: 14.4 g/dL (ref 12.0–15.0)
Lymphocytes Relative: 24.2 % (ref 12.0–46.0)
Lymphs Abs: 2.1 10*3/uL (ref 0.7–4.0)
MCHC: 33.4 g/dL (ref 30.0–36.0)
MCV: 88.8 fl (ref 78.0–100.0)
MONO ABS: 0.4 10*3/uL (ref 0.1–1.0)
Monocytes Relative: 4.9 % (ref 3.0–12.0)
Neutro Abs: 5.8 10*3/uL (ref 1.4–7.7)
Neutrophils Relative %: 66.9 % (ref 43.0–77.0)
PLATELETS: 328 10*3/uL (ref 150.0–400.0)
RBC: 4.86 Mil/uL (ref 3.87–5.11)
RDW: 14.3 % (ref 11.5–15.5)
WBC: 8.6 10*3/uL (ref 4.0–10.5)

## 2017-09-10 LAB — COMPREHENSIVE METABOLIC PANEL
ALBUMIN: 4.7 g/dL (ref 3.5–5.2)
ALK PHOS: 65 U/L (ref 39–117)
ALT: 18 U/L (ref 0–35)
AST: 14 U/L (ref 0–37)
BUN: 11 mg/dL (ref 6–23)
CALCIUM: 10.2 mg/dL (ref 8.4–10.5)
CHLORIDE: 108 meq/L (ref 96–112)
CO2: 32 mEq/L (ref 19–32)
Creatinine, Ser: 0.76 mg/dL (ref 0.40–1.20)
GFR: 84.55 mL/min (ref >60.00)
Glucose, Bld: 114 mg/dL — ABNORMAL HIGH (ref 70–99)
POTASSIUM: 4.4 meq/L (ref 3.5–5.1)
Sodium: 145 mEq/L (ref 135–145)
TOTAL PROTEIN: 7.1 g/dL (ref 6.0–8.3)
Total Bilirubin: 0.3 mg/dL (ref 0.2–1.2)

## 2017-09-10 LAB — H. PYLORI ANTIBODY, IGG: H Pylori IgG: NEGATIVE

## 2017-09-10 MED ORDER — PANTOPRAZOLE SODIUM 40 MG PO TBEC: 40.0000 mg | DELAYED_RELEASE_TABLET | Freq: Every day | ORAL | 3 refills | Status: DC

## 2017-09-10 NOTE — Progress Notes (Signed)
Patient ID: Deborah Watts, female   DOB: Nov 09, 1965, 52 y.o.   MRN: [id number]     Subjective:  I acted as a Neurosurgeon for Dr. Zola Button.  Apolonio Schneiders, CMA   Patient ID: Deborah Watts, female    DOB: 1965/05/23, 53 y.o.   MRN: [id number]  Chief Complaint  Patient presents with  . burping for 3 days    HPI  Patient is in today for burping.  She has been burping for 3 days.  It started Tuesday -- she started phazyme but it progressively getting worse.  She has pain in R flank  and midepigastric pain that radiates across her back.   Patient Care Team: Zola Button, Grayling Congress, DO as PCP - General   Past Medical History:  Diagnosis Date  . Anxiety   . Asthma   . Hypertension   . MHA (microangiopathic hemolytic anemia) (HCC)     Past Surgical History:  Procedure Laterality Date  . APPENDECTOMY N/A   . LAPAROSCOPIC CHOLECYSTECTOMY N/A 03/1992  . TOTAL ABDOMINAL HYSTERECTOMY Right     Family History  Problem Relation Age of Onset  . Throat cancer Father   . Asthma Unknown   . Diabetes Unknown   . Hyperlipidemia Unknown     Social History   Socioeconomic History  . Marital status: Married    Spouse name: Not on file  . Number of children: Not on file  . Years of education: Not on file  . Highest education level: Not on file  Occupational History  . 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 Financial resource strain: Not on file  . Food insecurity:    Worry: Not on file    Inability: Not on file  . Transportation needs:    Medical: Not on file    Non-medical: Not on file  Tobacco Use  . Smoking status: Never Smoker  . Smokeless tobacco: Never Used  Substance and Sexual Activity  . Alcohol use: Yes    Alcohol/week: 0.0 oz    Comment: occassional  . Drug use: No  . Sexual activity: Yes    Partners: Male  Lifestyle  . Physical activity:    Days per week: Not on file    Minutes per session: Not on file  . Stress: Not on file    Relationships  . Social connections:    Talks on phone: Not on file    Gets together: Not on file    Attends religious service: Not on file    Active member of club or organization: Not on file    Attends meetings of clubs or organizations: Not on file    Relationship status: Not on file  . Intimate partner violence:    Fear of current or ex partner: Not on file    Emotionally abused: Not on file    Physically abused: Not on file    Forced sexual activity: Not on file  Other Topics Concern  . Not on file  Social History Narrative   Exercise-- no    Outpatient Medications Prior to Visit  Medication Sig Dispense Refill  . Albuterol Sulfate (PROAIR RESPICLICK) 108 (90 Base) MCG/ACT AEPB Inhale 2 puffs into the lungs every 6 (six) hours as needed. 3 each 3  . ALPRAZolam (XANAX) 0.5 MG tablet TAKE ONE TABLET BY MOUTH THREE TIMES DAILY FOR SLEEP OR ANXIETY 90 tablet 2  . Cholecalciferol (VITAMIN D-400 PO) Take 1 capsule by mouth  daily.    . diclofenac (VOLTAREN) 75 MG EC tablet Take 1 tablet (75 mg total) by mouth 2 (two) times daily. 60 tablet 3  . diclofenac sodium (VOLTAREN) 1 % GEL Apply 4 g topically 4 (four) times daily. 1 Tube 2  . doxycycline (VIBRA-TABS) 100 MG tablet Take 1 tablet (100 mg total) by mouth 2 (two) times daily. 14 tablet 0  . fluconazole (DIFLUCAN) 150 MG tablet 1 po x1, may repeat in 3 days prn 2 tablet 2  . fluticasone (FLONASE) 50 MCG/ACT nasal spray Place 2 sprays into both nostrils daily. 16 g 6  . levofloxacin (LEVAQUIN) 500 MG tablet Take 1 tablet (500 mg total) by mouth daily. 7 tablet 0  . loratadine (CLARITIN) 10 MG tablet Take 10 mg by mouth daily.    . Magnesium 250 MG TABS Take 1 tablet by mouth daily.    . Meclizine HCl (TRAVEL SICKNESS) 25 MG CHEW CHEW 1 TABLET EVERY 6 HOURS AS NEEDED 45 each 2  . mometasone-formoterol (DULERA) 200-5 MCG/ACT AERO Inhale 2 puffs into the lungs 2 (two) times daily. 3 Inhaler 3  . montelukast (SINGULAIR) 10 MG tablet  Take 1 tablet (10 mg total) by mouth at bedtime. 90 tablet 3  . nitroGLYCERIN (NITRODUR - DOSED IN MG/24 HR) 0.2 mg/hr patch Apply 1/4th patch to affected ankle, change daily 30 patch 1  . SUMAtriptan (IMITREX) 100 MG tablet TAKE 1 TABLET BY MOUTH AS NEEDED FOR MIGRAINE AND REPEAT IN 2 HOURS AS NEEDED 12 tablet 1  . topiramate (TOPAMAX) 50 MG tablet Take 1 tablet (50 mg total) by mouth 2 (two) times daily. 180 tablet 3  . traMADol (ULTRAM) 50 MG tablet Take 1 tablet (50 mg total) by mouth every 8 (eight) hours as needed. 30 tablet 0   No facility-administered medications prior to visit.     Allergies  Allergen Reactions  . Fish Allergy Hives and Swelling  . Penicillins Anaphylaxis and Hives  . Iohexol      Desc: hives, sob, pt. needs 13 hr prep   01/16/05   . Ivp Dye [Iodinated Diagnostic Agents] Hives and Swelling    Review of Systems  Constitutional: Negative for fever and malaise/fatigue.  HENT: Negative for congestion.   Eyes: Negative for blurred vision.  Respiratory: Negative for cough and shortness of breath.   Cardiovascular: Negative for chest pain, palpitations and leg swelling.  Gastrointestinal: Positive for abdominal pain (right upper quadrant). Negative for vomiting.  Musculoskeletal: Negative for back pain.  Skin: Negative for rash.  Neurological: Negative for loss of consciousness and headaches.       Objective:    Physical Exam  Constitutional: She is oriented to person, place, and time. She appears well-developed and well-nourished.  HENT:  Head: Normocephalic and atraumatic.  Eyes: Conjunctivae and EOM are normal.  Neck: Normal range of motion. Neck supple. No JVD present. Carotid bruit is not present. No thyromegaly present.  Cardiovascular: Normal rate, regular rhythm and normal heart sounds.  No murmur heard. Pulmonary/Chest: Effort normal and breath sounds normal. No respiratory distress. She has no wheezes. She has no rales. She exhibits no tenderness.   Abdominal: Soft. She exhibits distension. There is tenderness in the epigastric area.  Musculoskeletal: She exhibits no edema.  Neurological: She is alert and oriented to person, place, and time.  Psychiatric: She has a normal mood and affect.  Nursing note and vitals reviewed.   BP 136/78 (BP Location: Left Arm, Cuff Size: Normal)  Pulse 69   Temp 98.6 F (37 C) (Oral)   Resp 16   Ht 4\' 11"  (1.499 m)   Wt 164 lb 6.4 oz (74.6 kg)   SpO2 96%   BMI 33.20 kg/m  Wt Readings from Last 3 Encounters:  09/10/17 164 lb 6.4 oz (74.6 kg)  05/25/17 171 lb 4 oz (77.7 kg)  05/21/17 174 lb (78.9 kg)   BP Readings from Last 3 Encounters:  09/10/17 136/78  05/25/17 110/78  05/21/17 118/73     Immunization History  Administered Date(s) Administered  . Hepatitis B 03/29/2014, 10/06/2014  . Influenza Whole 01/29/2007, 02/02/2008  . Influenza,inj,Quad PF,6+ Mos 01/06/2013, 12/05/2013, 03/27/2017  . Influenza-Unspecified 11/21/2015  . Pneumococcal Polysaccharide-23 01/06/2013  . Tdap 03/17/2004, 12/23/2013    Health Maintenance  Topic Date Due  . COLONOSCOPY  06/28/2014  . INFLUENZA VACCINE  10/15/2017  . MAMMOGRAM  03/27/2018  . TETANUS/TDAP  12/24/2023  . HIV Screening  Completed    Lab Results  Component Value Date   WBC 7.9 03/27/2017   HGB 14.4 03/27/2017   HCT 43.9 03/27/2017   PLT 326.0 03/27/2017   GLUCOSE 94 03/27/2017   CHOL 187 03/27/2017   TRIG 203.0 (H) 03/27/2017   HDL 46.90 03/27/2017   LDLDIRECT 124.0 03/27/2017   LDLCALC 96 03/23/2015   ALT 28 03/27/2017   AST 22 03/27/2017   NA 143 03/27/2017   K 4.3 03/27/2017   CL 108 03/27/2017   CREATININE 0.75 03/27/2017   BUN 16 03/27/2017   CO2 28 03/27/2017   TSH 0.95 03/27/2017    Lab Results  Component Value Date   TSH 0.95 03/27/2017   Lab Results  Component Value Date   WBC 7.9 03/27/2017   HGB 14.4 03/27/2017   HCT 43.9 03/27/2017   MCV 89.8 03/27/2017   PLT 326.0 03/27/2017   Lab Results    Component Value Date   NA 143 03/27/2017   K 4.3 03/27/2017   CO2 28 03/27/2017   GLUCOSE 94 03/27/2017   BUN 16 03/27/2017   CREATININE 0.75 03/27/2017   BILITOT 0.3 03/27/2017   ALKPHOS 74 03/27/2017   AST 22 03/27/2017   ALT 28 03/27/2017   PROT 7.6 03/27/2017   ALBUMIN 4.7 03/27/2017   CALCIUM 9.8 03/27/2017   ANIONGAP 12 03/29/2015   GFR 86.00 03/27/2017   Lab Results  Component Value Date   CHOL 187 03/27/2017   Lab Results  Component Value Date   HDL 46.90 03/27/2017   Lab Results  Component Value Date   LDLCALC 96 03/23/2015   Lab Results  Component Value Date   TRIG 203.0 (H) 03/27/2017   Lab Results  Component Value Date   CHOLHDL 4 03/27/2017   No results found for: HGBA1C       Assessment & Plan:   Problem List Items Addressed This Visit    None    Visit Diagnoses    Hx of migraines    -  Primary   Relevant Orders   Pain Mgmt, Tramadol w/medMATCH, U   Anxiety       Relevant Orders   Pain Mgmt, Profile 8 w/Conf, U   Dyspepsia       Relevant Medications   pantoprazole (PROTONIX) 40 MG tablet   Other Relevant Orders   CBC with Differential/Platelet   H. pylori antibody, IgG   Comprehensive metabolic panel    gi cocktail done --with some relief  Use protonix for 6-8 weeks -- if unable  to come off of it -- we will refer to GI Check labs   I am having Stan Head. Gasper start on pantoprazole. I am also having her maintain her nitroGLYCERIN, Magnesium, Cholecalciferol (VITAMIN D-400 PO), loratadine, Albuterol Sulfate, fluticasone, mometasone-formoterol, montelukast, SUMAtriptan, topiramate, ALPRAZolam, Meclizine HCl, traMADol, levofloxacin, doxycycline, fluconazole, diclofenac, and diclofenac sodium.  Meds ordered this encounter  Medications  . pantoprazole (PROTONIX) 40 MG tablet    Sig: Take 1 tablet (40 mg total) by mouth daily.    Dispense:  30 tablet    Refill:  3    CMA served as scribe during this visit. History, Physical and Plan  performed by medical provider. Documentation and orders reviewed and attested to.  Donato Schultz, DO

## 2017-09-10 NOTE — Patient Instructions (Signed)
Indigestion Indigestion is a feeling of pain, discomfort, burning, or fullness in the upper part of your abdomen. It can come and go. It may occur frequently or rarely. Indigestion tends to occur while you are eating or right after you have finished eating. It may be worse at night and while bending over or lying down. Follow these instructions at home: Take these actions to decrease your pain or discomfort and to help avoid complications. Diet  Follow a diet as recommended by your health care provider. This may involve avoiding foods and drinks such as: ? Coffee and tea (with or without caffeine). ? Drinks that contain alcohol. ? Energy drinks and sports drinks. ? Carbonated drinks or sodas. ? Chocolate and cocoa. ? Peppermint and mint flavorings. ? Garlic and onions. ? Horseradish. ? Spicy and acidic foods, including peppers, chili powder, curry powder, vinegar, hot sauces, and barbecue sauce. ? Citrus fruit juices and citrus fruits, such as oranges, lemons, and limes. ? Tomato-based foods, such as red sauce, chili, salsa, and pizza with red sauce. ? Fried and fatty foods, such as donuts, french fries, potato chips, and high-fat dressings. ? High-fat meats, such as hot dogs and fatty cuts of red and white meats, such as rib eye steak, sausage, ham, and bacon. ? High-fat dairy items, such as whole milk, butter, and cream cheese.  Eat small, frequent meals instead of large meals.  Avoid drinking large amounts of liquid with your meals.  Avoid eating meals during the 2-3 hours before bedtime.  Avoid lying down right after you eat.  Do not exercise right after you eat. General instructions  Pay attention to any changes in your symptoms.  Take over-the-counter and prescription medicines only as told by your health care provider. Do not take aspirin, ibuprofen, or other NSAIDs unless your health care provider told you to do so.  Do not use any tobacco products, including cigarettes,  chewing tobacco, and e-cigarettes. If you need help quitting, ask your health care provider.  Wear loose-fitting clothing. Do not wear anything tight around your waist that causes pressure on your abdomen.  Raise (elevate) the head of your bed about 6 inches (15 cm).  Try to reduce your stress, such as with yoga or meditation. If you need help reducing stress, ask your health care provider.  If you are overweight, reduce your weight to an amount that is healthy for you. Ask your health care provider for guidance about a safe weight loss goal.  Keep all follow-up visits as told by your health care provider. This is important. Contact a health care provider if:  You have new symptoms.  You have unexplained weight loss.  You have difficulty swallowing, or it hurts to swallow.  Your symptoms do not improve with treatment.  Your symptoms last for more than two days.  You have a fever.  You vomit. Get help right away if:  You have pain in your arms, neck, jaw, teeth, or back.  You feel sweaty, dizzy, or light-headed.  You faint.  You have chest pain or shortness of breath.  You cannot stop vomiting, or you vomit blood.  Your stool is bloody or black.  You have severe pain in your abdomen. This information is not intended to replace advice given to you by your health care provider. Make sure you discuss any questions you have with your health care provider. Document Released: 04/10/2004 Document Revised: 08/09/2015 Document Reviewed: 06/28/2014 Elsevier Interactive Patient Education  2018 Elsevier Inc.  

## 2017-09-13 ENCOUNTER — Encounter: Payer: Self-pay | Admitting: Family Medicine

## 2017-09-13 LAB — PAIN MGMT, PROFILE 8 W/CONF, U
6 Acetylmorphine: NEGATIVE ng/mL
Alcohol Metabolites: NEGATIVE ng/mL
Alphahydroxyalprazolam: 214 ng/mL — ABNORMAL HIGH
Alphahydroxymidazolam: NEGATIVE ng/mL
Alphahydroxytriazolam: NEGATIVE ng/mL
Aminoclonazepam: NEGATIVE ng/mL
Amphetamines: NEGATIVE ng/mL
Benzodiazepines: POSITIVE ng/mL — AB
Buprenorphine, Urine: NEGATIVE ng/mL
Cocaine Metabolite: NEGATIVE ng/mL
Creatinine: 81.4 mg/dL
Hydroxyethylflurazepam: NEGATIVE ng/mL
Lorazepam: NEGATIVE ng/mL
MDMA: NEGATIVE ng/mL
Marijuana Metabolite: NEGATIVE ng/mL
Nordiazepam: NEGATIVE ng/mL
Opiates: NEGATIVE ng/mL
Oxazepam: NEGATIVE ng/mL
Oxidant: NEGATIVE ug/mL
Oxycodone: NEGATIVE ng/mL
Temazepam: NEGATIVE ng/mL
pH: 6.69 (ref 4.5–9.0)

## 2017-09-13 LAB — PAIN MGMT, TRAMADOL W/MEDMATCH, U
Desmethyltramadol: NEGATIVE ng/mL (ref <100)
Tramadol: NEGATIVE ng/mL (ref <100)

## 2017-09-23 ENCOUNTER — Encounter: Payer: Self-pay | Admitting: Family Medicine

## 2017-10-06 ENCOUNTER — Other Ambulatory Visit: Payer: Self-pay

## 2017-10-06 DIAGNOSIS — R109 Unspecified abdominal pain: Secondary | ICD-10-CM

## 2017-10-06 MED ORDER — TRAMADOL HCL 50 MG PO TABS: 50.0000 mg | ORAL_TABLET | Freq: Three times a day (TID) | ORAL | 0 refills | Status: DC | PRN

## 2017-10-06 NOTE — Telephone Encounter (Signed)
Requesting:Tramadol 50mg  every 8hr prn Contract: yes UDS: 09/10/17 Last OV: 09/10/17 Next Ov: 03/29/18 Last refill: 03/27/17 Database: no discrepancies found  Please advise.

## 2017-10-12 ENCOUNTER — Encounter: Payer: Self-pay | Admitting: Family Medicine

## 2017-10-13 ENCOUNTER — Other Ambulatory Visit: Payer: Self-pay | Admitting: Family Medicine

## 2017-10-13 DIAGNOSIS — F411 Generalized anxiety disorder: Secondary | ICD-10-CM

## 2017-10-13 MED ORDER — ALPRAZOLAM 0.5 MG PO TABS: ORAL_TABLET | ORAL | 2 refills | Status: DC

## 2017-10-13 NOTE — Telephone Encounter (Signed)
Last alprazolam RX: 03/27/17, #90 x 2 refills. Last OV: 09/10/17 Next OV: 03/29/18 UDS: 09/10/17, low risk repeat 09/11/18 CSC: 09/10/17 CSR: No discrepancies identified

## 2018-01-07 ENCOUNTER — Other Ambulatory Visit: Payer: Self-pay | Admitting: Family Medicine

## 2018-01-07 DIAGNOSIS — R109 Unspecified abdominal pain: Secondary | ICD-10-CM

## 2018-01-08 MED ORDER — TRAMADOL HCL 50 MG PO TABS: 50.0000 mg | ORAL_TABLET | Freq: Three times a day (TID) | ORAL | 0 refills | Status: DC | PRN

## 2018-01-08 NOTE — Telephone Encounter (Signed)
Dr Carmelia Roller -- please advise in PCP's absence?  Last tramadol RX: 10/06/17, #30 Last OV: 09/10/17 Next OV: 03/29/18 UDS: 09/10/17, low risk. Next due 09/11/18 CSC: 09/10/17 CSR: No discrepancies identified

## 2018-01-09 ENCOUNTER — Encounter: Payer: Self-pay | Admitting: Family Medicine

## 2018-01-09 DIAGNOSIS — M170 Bilateral primary osteoarthritis of knee: Secondary | ICD-10-CM

## 2018-01-11 MED ORDER — DICLOFENAC SODIUM 75 MG PO TBEC: 75.0000 mg | DELAYED_RELEASE_TABLET | Freq: Two times a day (BID) | ORAL | 3 refills | Status: DC

## 2018-02-02 ENCOUNTER — Encounter (HOSPITAL_COMMUNITY): Payer: Self-pay

## 2018-02-02 NOTE — Pre-Procedure Instructions (Signed)
Deborah Watts  02/02/2018      [facility] - [place], [place] - [id number] [place] [address] Phone: [phone] Fax: [phone]    Your procedure is scheduled on February 12, 2018.  Report to [facility] Admitting at 740 AM.  Call this number if you have problems the morning of surgery:  [phone]   Remember:  Do not eat or drink after midnight.    Take these medicines the morning of surgery with A SIP OF WATER  Albuterol inhaler-if needed-bring inhaler with you Meclizine-if needed for vertigo topiramte (topamax) Tramadol (ultram)  7 days prior to surgery STOP taking any diclofenac (voltaren), Aspirin (unless otherwise instructed by your surgeon), Aleve, Naproxen, Ibuprofen, Motrin, Advil, Goody's, BC's, all herbal medications, fish oil, and all vitamins     Do not wear jewelry, make-up or nail polish.  Do not wear lotions, powders, or perfumes, or deodorant.  Do not shave 48 hours prior to surgery.   Do not bring valuables to the hospital.  [facility] is not responsible for any belongings or valuables.  Contacts, dentures or bridgework may not be worn into surgery.  Leave your suitcase in the car.  After surgery it may be brought to your room.  For patients admitted to the hospital, discharge time will be determined by your treatment team.  Patients discharged the day of surgery will not be allowed to drive home.    [facility]- Preparing For Surgery  Before surgery, you can play an important role. Because skin is not sterile, your skin needs to be as free of germs as possible. You can reduce the number of germs on your skin by washing with CHG (chlorahexidine gluconate) Soap before surgery.  CHG is an antiseptic cleaner which kills germs and bonds with the skin to continue killing germs even after washing.    Oral Hygiene is also important to reduce your risk of infection.  Remember - BRUSH YOUR TEETH THE  MORNING OF SURGERY WITH YOUR REGULAR TOOTHPASTE  Please do not use if you have an allergy to CHG or antibacterial soaps. If your skin becomes reddened/irritated stop using the CHG.  Do not shave (including legs and underarms) for at least 48 hours prior to first CHG shower. It is OK to shave your face.  Please follow these instructions carefully.   1. Shower the NIGHT BEFORE SURGERY and the MORNING OF SURGERY with CHG.   2. If you chose to wash your hair, wash your hair first as usual with your normal shampoo.  3. After you shampoo, rinse your hair and body thoroughly to remove the shampoo.  4. Use CHG as you would any other liquid soap. You can apply CHG directly to the skin and wash gently with a scrungie or a clean washcloth.   5. Apply the CHG Soap to your body ONLY FROM THE NECK DOWN.  Do not use on open wounds or open sores. Avoid contact with your eyes, ears, mouth and genitals (private parts). Wash Face and genitals (private parts)  with your normal soap.  6. Wash thoroughly, paying special attention to the area where your surgery will be performed.  7. Thoroughly rinse your body with warm water from the neck down.  8. DO NOT shower/wash with your normal soap after using and rinsing off the CHG Soap.  9. Pat yourself dry with a CLEAN TOWEL.  10. Wear CLEAN PAJAMAS to  bed the night before surgery, wear comfortable clothes the morning of surgery  11. Place CLEAN SHEETS on your bed the night of your first shower and DO NOT SLEEP WITH PETS.  Day of Surgery:  Do not apply any deodorants/lotions.  Please wear clean clothes to the hospital/surgery center.   Remember to brush your teeth WITH YOUR REGULAR TOOTHPASTE.  Please read over the following fact sheets that you were given. Pain Booklet, Coughing and Deep Breathing and Surgical Site Infection Prevention

## 2018-02-03 ENCOUNTER — Encounter (HOSPITAL_COMMUNITY)
Admission: RE | Admit: 2018-02-03 | Discharge: 2018-02-03 | Disposition: A | Discharge status: Home / Self Care | Payer: PRIVATE HEALTH INSURANCE | Source: Ambulatory Visit | Attending: Orthopedic Surgery | Admitting: Orthopedic Surgery

## 2018-02-03 ENCOUNTER — Other Ambulatory Visit: Payer: Self-pay

## 2018-02-03 ENCOUNTER — Encounter (HOSPITAL_COMMUNITY): Payer: Self-pay

## 2018-02-03 DIAGNOSIS — G5601 Carpal tunnel syndrome, right upper limb: Secondary | ICD-10-CM | POA: Insufficient documentation

## 2018-02-03 DIAGNOSIS — Z01818 Encounter for other preprocedural examination: Secondary | ICD-10-CM | POA: Insufficient documentation

## 2018-02-03 HISTORY — DX: Unspecified osteoarthritis, unspecified site: M19.90

## 2018-02-03 HISTORY — DX: Headache: R51

## 2018-02-03 HISTORY — DX: Headache, unspecified: R51.9

## 2018-02-03 HISTORY — DX: Personal history of other diseases of the digestive system: Z87.19

## 2018-02-03 LAB — BASIC METABOLIC PANEL
Anion gap: 8 (ref 5–15)
BUN: 17 mg/dL (ref 6–20)
CHLORIDE: 108 mmol/L (ref 98–111)
CO2: 22 mmol/L (ref 22–32)
Calcium: 9.3 mg/dL (ref 8.9–10.3)
Creatinine, Ser: 0.83 mg/dL (ref 0.44–1.00)
GFR calc Af Amer: >60 mL/min (ref >60)
GFR calc non Af Amer: >60 mL/min (ref >60)
GLUCOSE: 102 mg/dL — AB (ref 70–99)
POTASSIUM: 3.4 mmol/L — AB (ref 3.5–5.1)
Sodium: 138 mmol/L (ref 135–145)

## 2018-02-03 LAB — CBC
HEMATOCRIT: 44.9 % (ref 36.0–46.0)
HEMOGLOBIN: 13.9 g/dL (ref 12.0–15.0)
MCH: 28 pg (ref 26.0–34.0)
MCHC: 31 g/dL (ref 30.0–36.0)
MCV: 90.3 fL (ref 80.0–100.0)
PLATELETS: 331 10*3/uL (ref 150–400)
RBC: 4.97 MIL/uL (ref 3.87–5.11)
RDW: 13.6 % (ref 11.5–15.5)
WBC: 13.9 10*3/uL — ABNORMAL HIGH (ref 4.0–10.5)
nRBC: 0 % (ref 0.0–0.2)

## 2018-02-03 NOTE — Pre-Procedure Instructions (Signed)
Deborah Watts  02/03/2018      [facility] - [place], [place] - [id number] [place] [address] Phone: [phone] Fax: [phone]    Your procedure is scheduled on Friday, February 12, 2018.   [facility] Admitting at 740 AM.             (posted surgery time 9:40a - 11:45a)   Call this number if you have problems the morning of surgery:  [phone]   Remember:  Do not eat any foods or drink any liquids after midnight, Thursday.    Take these medicines the morning of surgery with A SIP OF WATER  Albuterol inhaler-if needed-bring inhaler with you Meclizine-if needed for vertigo topiramte (topamax) Tramadol (ultram)  7 days prior to surgery STOP taking any diclofenac (voltaren), Aspirin (unless otherwise instructed by your surgeon), Aleve, Naproxen, Ibuprofen, Motrin, Advil, Goody's, BC's, all herbal medications, fish oil, and all vitamins     Do not wear jewelry, make-up or nail polish.  Do not wear lotions, powders,  perfumes, or deodorant.  Do not shave 48 hours prior to surgery.   Do not bring valuables to the hospital.  [facility] is not responsible for any belongings or valuables.  Contacts, dentures or bridgework may not be worn into surgery.  Leave your suitcase in the car.  After surgery it may be brought to your room.  For patients admitted to the hospital, discharge time will be determined by your treatment team.  Patients discharged the day of surgery will not be allowed to drive home, and will need someone to stay with you for the first 24 hrs.     [facility]- Preparing For Surgery  Before surgery, you can play an important role. Because skin is not sterile, your skin needs to be as free of germs as possible. You can reduce the number of germs on your skin by washing with CHG (chlorahexidine gluconate) Soap before surgery.  CHG is an antiseptic cleaner which kills germs and bonds with the  skin to continue killing germs even after washing.    Oral Hygiene is also important to reduce your risk of infection.    Remember - BRUSH YOUR TEETH THE MORNING OF SURGERY WITH YOUR REGULAR TOOTHPASTE  Please do not use if you have an allergy to CHG or antibacterial soaps. If your skin becomes reddened/irritated stop using the CHG.  Do not shave (including legs and underarms) for at least 48 hours prior to first CHG shower. It is OK to shave your face.  Please follow these instructions carefully.   1. Shower the NIGHT BEFORE SURGERY and the MORNING OF SURGERY with CHG.   2. If you chose to wash your hair, wash your hair first as usual with your normal shampoo.  3. After you shampoo, rinse your hair and body thoroughly to remove the shampoo.  4. Use CHG as you would any other liquid soap. You can apply CHG directly to the skin and wash gently with a scrungie or a clean washcloth.   5. Apply the CHG Soap to your body ONLY FROM THE NECK DOWN.  Do not use on open wounds or open sores. Avoid contact with your eyes, ears, mouth and genitals (private parts). Wash Face and genitals (private parts)  with your normal soap.  6. Wash thoroughly, paying special attention to the area where your surgery will be performed.  7. Thoroughly  rinse your body with warm water from the neck down.  8. DO NOT shower/wash with your normal soap after using and rinsing off the CHG Soap.  9. Pat yourself dry with a CLEAN TOWEL.  10. Wear CLEAN PAJAMAS to bed the night before surgery, wear comfortable clothes the morning of surgery  11. Place CLEAN SHEETS on your bed the night of your first shower and DO NOT SLEEP WITH PETS.  Day of Surgery:  Do not apply any deodorants/lotions.  Please wear clean clothes to the hospital/surgery center.   Remember to brush your teeth WITH YOUR REGULAR TOOTHPASTE.  Please read over the following fact sheets that you were given. Pain Booklet and Surgical Site Infection  Prevention

## 2018-02-03 NOTE — Pre-Procedure Instructions (Signed)
   Stan Headmanda S Bearman  02/03/2018     [facility] - [place], [place] - [id number] [place] [address] Phone: [phone] Fax: [phone]   Your procedure is scheduled on Friday, February 12, 2018  [facility] Admitting at 7:40 A.M.  Call this number if you have problems the morning of surgery:  [phone]   Remember:  Do not eat or drink after midnight Thursday, February 11, 2018   Take these medicines the morning of surgery with A SIP OF WATER : topiramate (TOPAMAX), if needed:  traMADol (ULTRAM) for pain, SUMAtriptan (IMITREX) for migraine headache , Meclizine for vertigo, Albuterol Sulfate (PROAIR INHALER) for shortness of breath or wheezing ( BRING INHALER IN WITH YOU ON DAY OF SURGERY) Stop taking Aspirin ( unless advised otherwise by your surgeon), fish oil, Boswellia Serrata (BOSWELLIA), Ubiquinol (QUNOL COQ10/UBIQUINOL/MEGA) and herbal medications. Do not take any NSAIDs ie: Ibuprofen, Advil, Naproxen ( Aleve), Motrin, diclofenac (VOLTAREN), BC and Goody Powder; stop 7 days prior to surgery.  Do not wear jewelry, make-up or nail polish.  Do not wear lotions, powders, or perfumes, or deodorant.  Do not shave 48 hours prior to surgery.    Do not bring valuables to the hospital.  [facility] is not responsible for any belongings or valuables. Contacts, dentures or bridgework may not be worn into surgery.  Leave your suitcase in the car.  After surgery it may be brought to your room Patients discharged the day of surgery will not be allowed to drive home.  Special instructions: See " [facility] - Preparing For Surgery " sheet. Please read over the following fact sheets that you were given. Pain Booklet, Coughing and Deep Breathing and Surgical Site Infection Prevention

## 2018-02-03 NOTE — Progress Notes (Signed)
PCP is Dr. Loreen FreudYvonne Watts   LOV 08/2017 Denies any cardiac issues, no murmur, no testing. 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

## 2018-02-04 NOTE — Anesthesia Preprocedure Evaluation (Addendum)
Anesthesia Evaluation  Patient identified by MRN, date of birth, ID band Patient awake    Reviewed: Allergy & Precautions, NPO status , Patient's Chart, lab work & pertinent test results  History of Anesthesia Complications Negative for: history of anesthetic complications  Airway Mallampati: II  TM Distance: >3 FB Neck ROM: Full    Dental  (+) Dental Advisory Given, Teeth Intact   Pulmonary asthma ,    breath sounds clear to auscultation       Cardiovascular (-) hypertension (denies) Rhythm:Regular Rate:Normal     Neuro/Psych  Headaches, PSYCHIATRIC DISORDERS Anxiety    GI/Hepatic Neg liver ROS, hiatal hernia,   Endo/Other  negative endocrine ROS  Renal/GU negative Renal ROS     Musculoskeletal  (+) Arthritis ,   Abdominal   Peds  Hematology  (+) anemia ,   Anesthesia Other Findings Anaphylaxis to PCN  Reproductive/Obstetrics  S/p hysterectomy                            Anesthesia Physical Anesthesia Plan  ASA: II  Anesthesia Plan: General   Post-op Pain Management:    Induction: Intravenous  PONV Risk Score and Plan: 3 and Treatment may vary due to age or medical condition, Ondansetron, Dexamethasone and Midazolam  Airway Management Planned: LMA  Additional Equipment: None  Intra-op Plan:   Post-operative Plan: Extubation in OR  Informed Consent: I have reviewed the patients History and Physical, chart, labs and discussed the procedure including the risks, benefits and alternatives for the proposed anesthesia with the patient or authorized representative who has indicated his/her understanding and acceptance.   Dental advisory given  Plan Discussed with: CRNA and Anesthesiologist  Anesthesia Plan Comments:        Anesthesia Quick Evaluation

## 2018-02-08 ENCOUNTER — Encounter: Payer: Self-pay | Admitting: Family Medicine

## 2018-02-08 ENCOUNTER — Other Ambulatory Visit: Payer: Self-pay | Admitting: Family Medicine

## 2018-02-08 DIAGNOSIS — F411 Generalized anxiety disorder: Secondary | ICD-10-CM

## 2018-02-08 MED ORDER — ALPRAZOLAM 0.5 MG PO TABS: ORAL_TABLET | ORAL | 2 refills | Status: DC

## 2018-02-08 NOTE — Telephone Encounter (Signed)
Its been  sent

## 2018-02-08 NOTE — Telephone Encounter (Signed)
Not that I see- last refill date was 10/13/2017.

## 2018-02-08 NOTE — Telephone Encounter (Signed)
Pt is requesting refill on alprazolam.   Last OV: 09/10/2017 Last Fill: 10/13/2017 #90 and 2RF Pt sig: 1 tab tid prn UDS: 09/10/2017 Low risk

## 2018-02-08 NOTE — Telephone Encounter (Signed)
Was it sent on the 14th ?

## 2018-02-12 ENCOUNTER — Encounter (HOSPITAL_COMMUNITY)
Admission: RE | Disposition: A | Discharge status: Home / Self Care | Payer: Self-pay | Source: Ambulatory Visit | Attending: Orthopedic Surgery

## 2018-02-12 ENCOUNTER — Encounter (HOSPITAL_COMMUNITY): Payer: Self-pay

## 2018-02-12 ENCOUNTER — Ambulatory Visit (HOSPITAL_COMMUNITY)
Admission: RE | Admit: 2018-02-12 | Discharge: 2018-02-12 | Disposition: A | Discharge status: Home / Self Care | Payer: PRIVATE HEALTH INSURANCE | Source: Ambulatory Visit | Attending: Orthopedic Surgery | Admitting: Orthopedic Surgery

## 2018-02-12 ENCOUNTER — Ambulatory Visit (HOSPITAL_COMMUNITY): Payer: PRIVATE HEALTH INSURANCE | Admitting: Physician Assistant

## 2018-02-12 ENCOUNTER — Ambulatory Visit (HOSPITAL_COMMUNITY): Payer: PRIVATE HEALTH INSURANCE | Admitting: Certified Registered Nurse Anesthetist

## 2018-02-12 DIAGNOSIS — Z825 Family history of asthma and other chronic lower respiratory diseases: Secondary | ICD-10-CM | POA: Diagnosis not present

## 2018-02-12 DIAGNOSIS — Z91013 Allergy to seafood: Secondary | ICD-10-CM | POA: Diagnosis not present

## 2018-02-12 DIAGNOSIS — G43909 Migraine, unspecified, not intractable, without status migrainosus: Secondary | ICD-10-CM | POA: Diagnosis not present

## 2018-02-12 DIAGNOSIS — M659 Synovitis and tenosynovitis, unspecified: Secondary | ICD-10-CM | POA: Diagnosis not present

## 2018-02-12 DIAGNOSIS — K449 Diaphragmatic hernia without obstruction or gangrene: Secondary | ICD-10-CM | POA: Insufficient documentation

## 2018-02-12 DIAGNOSIS — Z8249 Family history of ischemic heart disease and other diseases of the circulatory system: Secondary | ICD-10-CM | POA: Insufficient documentation

## 2018-02-12 DIAGNOSIS — I10 Essential (primary) hypertension: Secondary | ICD-10-CM | POA: Insufficient documentation

## 2018-02-12 DIAGNOSIS — Z833 Family history of diabetes mellitus: Secondary | ICD-10-CM | POA: Insufficient documentation

## 2018-02-12 DIAGNOSIS — G5601 Carpal tunnel syndrome, right upper limb: Secondary | ICD-10-CM | POA: Diagnosis present

## 2018-02-12 DIAGNOSIS — Z880 Allergy status to penicillin: Secondary | ICD-10-CM | POA: Insufficient documentation

## 2018-02-12 DIAGNOSIS — F419 Anxiety disorder, unspecified: Secondary | ICD-10-CM | POA: Diagnosis not present

## 2018-02-12 DIAGNOSIS — Z888 Allergy status to other drugs, medicaments and biological substances status: Secondary | ICD-10-CM | POA: Diagnosis not present

## 2018-02-12 DIAGNOSIS — J45909 Unspecified asthma, uncomplicated: Secondary | ICD-10-CM | POA: Diagnosis not present

## 2018-02-12 DIAGNOSIS — Z885 Allergy status to narcotic agent status: Secondary | ICD-10-CM | POA: Insufficient documentation

## 2018-02-12 DIAGNOSIS — Z9049 Acquired absence of other specified parts of digestive tract: Secondary | ICD-10-CM | POA: Insufficient documentation

## 2018-02-12 DIAGNOSIS — M1990 Unspecified osteoarthritis, unspecified site: Secondary | ICD-10-CM | POA: Insufficient documentation

## 2018-02-12 DIAGNOSIS — Z800 Family history of malignant neoplasm of digestive organs: Secondary | ICD-10-CM | POA: Insufficient documentation

## 2018-02-12 DIAGNOSIS — Z79899 Other long term (current) drug therapy: Secondary | ICD-10-CM | POA: Insufficient documentation

## 2018-02-12 DIAGNOSIS — Z91041 Radiographic dye allergy status: Secondary | ICD-10-CM | POA: Diagnosis not present

## 2018-02-12 HISTORY — PX: WRIST ARTHROSCOPY: SHX838

## 2018-02-12 SURGERY — ARTHROSCOPY, WRIST
Anesthesia: General | Site: Wrist | Laterality: Right

## 2018-02-12 MED ORDER — GLYCOPYRROLATE PF 0.2 MG/ML IJ SOSY
PREFILLED_SYRINGE | INTRAMUSCULAR | Status: AC
  Filled 2018-02-12: qty 1

## 2018-02-12 MED ORDER — BUPIVACAINE HCL (PF) 0.25 % IJ SOLN
INTRAMUSCULAR | Status: AC
  Filled 2018-02-12: qty 30

## 2018-02-12 MED ORDER — SODIUM CHLORIDE 0.9 % IR SOLN
Status: DC | PRN
  Administered 2018-02-12 (×2): 1000 mL

## 2018-02-12 MED ORDER — LIDOCAINE 2% (20 MG/ML) 5 ML SYRINGE
INTRAMUSCULAR | Status: AC
  Filled 2018-02-12: qty 5

## 2018-02-12 MED ORDER — FENTANYL CITRATE (PF) 250 MCG/5ML IJ SOLN
INTRAMUSCULAR | Status: DC | PRN
  Administered 2018-02-12: 25 ug via INTRAVENOUS
  Administered 2018-02-12: 50 ug via INTRAVENOUS
  Administered 2018-02-12: 25 ug via INTRAVENOUS

## 2018-02-12 MED ORDER — OXYCODONE HCL 5 MG PO TABS: 5.0000 mg | ORAL_TABLET | Freq: Once | ORAL | Status: DC | PRN

## 2018-02-12 MED ORDER — FENTANYL CITRATE (PF) 100 MCG/2ML IJ SOLN
25.0000 ug | INTRAMUSCULAR | Status: DC | PRN
  Administered 2018-02-12 (×2): 50 ug via INTRAVENOUS

## 2018-02-12 MED ORDER — PROMETHAZINE HCL 25 MG/ML IJ SOLN
6.2500 mg | INTRAMUSCULAR | Status: DC | PRN
  Administered 2018-02-12: 6.25 mg via INTRAVENOUS

## 2018-02-12 MED ORDER — PHENYLEPHRINE HCL 10 MG/ML IJ SOLN
INTRAMUSCULAR | Status: DC | PRN
  Administered 2018-02-12 (×3): 80 ug via INTRAVENOUS
  Administered 2018-02-12: 40 ug via INTRAVENOUS
  Administered 2018-02-12: 80 ug via INTRAVENOUS

## 2018-02-12 MED ORDER — GLYCOPYRROLATE PF 0.2 MG/ML IJ SOSY
PREFILLED_SYRINGE | INTRAMUSCULAR | Status: DC | PRN
  Administered 2018-02-12 (×2): .1 mg via INTRAVENOUS

## 2018-02-12 MED ORDER — MIDAZOLAM HCL 2 MG/2ML IJ SOLN
INTRAMUSCULAR | Status: DC | PRN
  Administered 2018-02-12: 2 mg via INTRAVENOUS

## 2018-02-12 MED ORDER — VANCOMYCIN HCL IN DEXTROSE 1-5 GM/200ML-% IV SOLN
1000.0000 mg | INTRAVENOUS | Status: AC
  Administered 2018-02-12: 1000 mg via INTRAVENOUS
  Filled 2018-02-12: qty 200

## 2018-02-12 MED ORDER — ONDANSETRON HCL 4 MG/2ML IJ SOLN
INTRAMUSCULAR | Status: AC
  Filled 2018-02-12: qty 2

## 2018-02-12 MED ORDER — CHLORHEXIDINE GLUCONATE 4 % EX LIQD: 60.0000 mL | Freq: Once | CUTANEOUS | Status: DC

## 2018-02-12 MED ORDER — PROMETHAZINE HCL 25 MG/ML IJ SOLN
INTRAMUSCULAR | Status: AC
  Administered 2018-02-12: 6.25 mg via INTRAVENOUS
  Filled 2018-02-12: qty 1

## 2018-02-12 MED ORDER — FENTANYL CITRATE (PF) 250 MCG/5ML IJ SOLN
INTRAMUSCULAR | Status: AC
  Filled 2018-02-12: qty 5

## 2018-02-12 MED ORDER — OXYCODONE HCL 5 MG/5ML PO SOLN: 5.0000 mg | Freq: Once | ORAL | Status: DC | PRN

## 2018-02-12 MED ORDER — MIDAZOLAM HCL 2 MG/2ML IJ SOLN
INTRAMUSCULAR | Status: AC
  Filled 2018-02-12: qty 2

## 2018-02-12 MED ORDER — DEXAMETHASONE SODIUM PHOSPHATE 10 MG/ML IJ SOLN
INTRAMUSCULAR | Status: AC
  Filled 2018-02-12: qty 1

## 2018-02-12 MED ORDER — FENTANYL CITRATE (PF) 100 MCG/2ML IJ SOLN
INTRAMUSCULAR | Status: AC
  Administered 2018-02-12: 50 ug via INTRAVENOUS
  Filled 2018-02-12: qty 2

## 2018-02-12 MED ORDER — 0.9 % SODIUM CHLORIDE (POUR BTL) OPTIME
TOPICAL | Status: DC | PRN
  Administered 2018-02-12: 1000 mL

## 2018-02-12 MED ORDER — DEXAMETHASONE SODIUM PHOSPHATE 10 MG/ML IJ SOLN
INTRAMUSCULAR | Status: DC | PRN
  Administered 2018-02-12: 10 mg via INTRAVENOUS

## 2018-02-12 MED ORDER — LIDOCAINE HCL (CARDIAC) PF 100 MG/5ML IV SOSY
PREFILLED_SYRINGE | INTRAVENOUS | Status: DC | PRN
  Administered 2018-02-12: 60 mg via INTRATRACHEAL

## 2018-02-12 MED ORDER — ONDANSETRON HCL 4 MG/2ML IJ SOLN
INTRAMUSCULAR | Status: DC | PRN
  Administered 2018-02-12: 4 mg via INTRAVENOUS

## 2018-02-12 MED ORDER — PROPOFOL 10 MG/ML IV BOLUS
INTRAVENOUS | Status: DC | PRN
  Administered 2018-02-12: 150 mg via INTRAVENOUS
  Administered 2018-02-12: 50 mg via INTRAVENOUS

## 2018-02-12 MED ORDER — BUPIVACAINE HCL (PF) 0.25 % IJ SOLN
INTRAMUSCULAR | Status: DC | PRN
  Administered 2018-02-12: 18 mL

## 2018-02-12 MED ORDER — LACTATED RINGERS IV SOLN
INTRAVENOUS | Status: DC
  Administered 2018-02-12 (×2): via INTRAVENOUS

## 2018-02-12 SURGICAL SUPPLY — 46 items
BANDAGE ACE 3X5.8 VEL STRL LF (GAUZE/BANDAGES/DRESSINGS) ×2 IMPLANT
BANDAGE ACE 4X5 VEL STRL LF (GAUZE/BANDAGES/DRESSINGS) ×3 IMPLANT
BNDG COHESIVE 3X5 TAN STRL LF (GAUZE/BANDAGES/DRESSINGS) ×3 IMPLANT
BNDG GAUZE ELAST 4 BULKY (GAUZE/BANDAGES/DRESSINGS) ×4 IMPLANT
BUR GATOR 2.9 (BURR) ×2 IMPLANT
BUR GATOR 2.9MM (BURR) ×1
COVER SURGICAL LIGHT HANDLE (MISCELLANEOUS) ×3 IMPLANT
COVER WAND RF STERILE (DRAPES) ×3 IMPLANT
CUFF TOURNIQUET SINGLE 18IN (TOURNIQUET CUFF) ×3 IMPLANT
DECANTER SPIKE VIAL GLASS SM (MISCELLANEOUS) ×3 IMPLANT
DRAPE HALF SHEET 40X57 (DRAPES) ×9 IMPLANT
DRSG ADAPTIC 3X8 NADH LF (GAUZE/BANDAGES/DRESSINGS) ×2 IMPLANT
GAUZE SPONGE 4X4 12PLY STRL (GAUZE/BANDAGES/DRESSINGS) ×3 IMPLANT
GAUZE XEROFORM 1X8 LF (GAUZE/BANDAGES/DRESSINGS) ×3 IMPLANT
GAUZE XEROFORM 5X9 LF (GAUZE/BANDAGES/DRESSINGS) ×2 IMPLANT
GLOVE BIO SURGEON STRL SZ8 (GLOVE) ×3 IMPLANT
GLOVE BIOGEL M 8.0 STRL (GLOVE) ×3 IMPLANT
GOWN STRL REUS W/ TWL LRG LVL3 (GOWN DISPOSABLE) ×2 IMPLANT
GOWN STRL REUS W/ TWL XL LVL3 (GOWN DISPOSABLE) ×1 IMPLANT
GOWN STRL REUS W/TWL LRG LVL3 (GOWN DISPOSABLE) ×3
GOWN STRL REUS W/TWL XL LVL3 (GOWN DISPOSABLE) ×3
KIT BASIN OR (CUSTOM PROCEDURE TRAY) ×3 IMPLANT
KIT TURNOVER KIT B (KITS) ×3 IMPLANT
MANIFOLD NEPTUNE II (INSTRUMENTS) ×3 IMPLANT
NDL HYPO 25GX1X1/2 BEV (NEEDLE) ×1 IMPLANT
NEEDLE HYPO 21X1.5 SAFETY (NEEDLE) ×6 IMPLANT
NEEDLE HYPO 25GX1X1/2 BEV (NEEDLE) ×3 IMPLANT
NS IRRIG 1000ML POUR BTL (IV SOLUTION) ×3 IMPLANT
PACK ORTHO EXTREMITY (CUSTOM PROCEDURE TRAY) ×3 IMPLANT
PAD ARMBOARD 7.5X6 YLW CONV (MISCELLANEOUS) ×6 IMPLANT
PAD CAST 4YDX4 CTTN HI CHSV (CAST SUPPLIES) ×1 IMPLANT
PADDING CAST COTTON 4X4 STRL (CAST SUPPLIES) ×3
PROBE BIPOLAR ATHRO 135MM 90D (MISCELLANEOUS) ×2 IMPLANT
SCRUB BETADINE 4OZ XXX (MISCELLANEOUS) ×3 IMPLANT
SET SM JOINT TUBING/CANN (CANNULA) ×3 IMPLANT
SOL PREP POV-IOD 4OZ 10% (MISCELLANEOUS) ×3 IMPLANT
SPLINT FIBERGLASS 3X12 (CAST SUPPLIES) ×2 IMPLANT
SUT ETHILON 5 0 PS 2 18 (SUTURE) ×3 IMPLANT
SUT PROLENE 4 0 PS 2 18 (SUTURE) ×3 IMPLANT
SYR CONTROL 10ML LL (SYRINGE) ×3 IMPLANT
TOWEL OR 17X24 6PK STRL BLUE (TOWEL DISPOSABLE) ×3 IMPLANT
TUBE CONNECTING 12'X1/4 (SUCTIONS) ×1
TUBE CONNECTING 12X1/4 (SUCTIONS) ×2 IMPLANT
UNDERPAD 30X30 (UNDERPADS AND DIAPERS) ×3 IMPLANT
WAND SHORT BEVEL W/CORD (SURGICAL WAND) ×2 IMPLANT
WATER STERILE IRR 1000ML POUR (IV SOLUTION) ×3 IMPLANT

## 2018-02-12 NOTE — Anesthesia Procedure Notes (Signed)
Procedure Name: LMA Insertion Date/Time: 02/12/2018 10:38 AM Performed by: Modena MorrowGreenwood, Chanler Mendonca S, CRNA Pre-anesthesia Checklist: Patient identified, Emergency Drugs available, Suction available, Patient being monitored and Timeout performed Patient Re-evaluated:Patient Re-evaluated prior to induction Oxygen Delivery Method: Circle system utilized Preoxygenation: Pre-oxygenation with 100% oxygen Induction Type: IV induction Ventilation: Mask ventilation without difficulty LMA: LMA inserted LMA Size: 4.0 Number of attempts: 1 Placement Confirmation: breath sounds checked- equal and bilateral and positive ETCO2 Tube secured with: Tape Dental Injury: Teeth and Oropharynx as per pre-operative assessment

## 2018-02-12 NOTE — Op Note (Signed)
NAMEKAROLEE, MELONI MEDICAL RECORD [id number] ACCOUNT [id number] DATE OF BIRTH:Aug 01, 1965 FACILITY: [facility] LOCATION: [facility] PHYSICIAN:Brier Firebaugh M. Raahi Pea, MD  OPERATIVE REPORT  DATE OF PROCEDURE:  02/12/2018  PREOPERATIVE DIAGNOSES:   1.  Right wrist synovitis with TFC pain and MRI findings suggestive of inflammatory changes and degenerative changes within the wrist. 2.  Right carpal tunnel syndrome.  POSTOPERATIVE DIAGNOSES:   1.  Right wrist synovitis with TFC pain and MRI findings suggestive of inflammatory changes and degenerative changes within the wrist. 2.  Right carpal tunnel syndrome.  PROCEDURE: 1.  Right wrist evaluation under anesthesia. 2.  Right wrist arthroscopy with synovectomy and debridement of a nondestabilizing TFC and scapholunate membranous tears. 3.  Open right carpal tunnel release.  SURGEON:  Dominica Severin, MD  ASSISTANT:  None.  COMPLICATIONS:  None.  ANESTHESIA:  General.  TOURNIQUET TIME:  Less than 30 minutes.  INDICATIONS:  A 52 year old female with carpal tunnel syndrome documented on exam, and electrodiagnostic as well as MRI findings of degenerative changes throughout the wrist joint.  I have discussed the risks and benefits of surgery, options and care  plans and with this in mind, she desires to proceed.  DESCRIPTION OF PROCEDURE:  The patient was seen by myself and anesthesia and taken to the operative theater and underwent smooth induction of general anesthetic.  She was prepped and draped with Hibiclens prescrub followed by a 10-minute surgical  Betadine scrub and paint.  Given her PENICILLIN allergy, she was given vancomycin.  Following this, the patient had the arm elevated and tourniquet insufflated, and final timeout observed.  I made a small incision at the distal edges transverse carpal  ligament approximately 1.5-2 cm.  Dissection was carried down distal edge of the transverse carpal ligament was released under 4.0 loupe  magnification without difficulty.  Following this, I dissected down and made sure the fat pad egressed distally and  the superficial palmar arch was protected which it was.  Following this, we then very carefully and cautiously performed distal to proximal dissection to allow for adequate release of the proximal carpal tunnel leaflet.  I released the transverse carpal  ligament in its entirety with portions of the antebrachial fascia released.  The patient tolerated this well.  This area was irrigated and ultimately closed with Prolene.  Bipolar electrocautery was used for hemostasis.  Following this, I placed the patient in a wrist traction tower.  Ten pounds of traction force was created and the patient had a 3-4 working portal and viewing portal made via insufflation of the joint with saline and then entering the joint very  carefully in a blunt dissection.  A 6U outflow and working portal was then created.  A 6R outflow and working portal was then created under needle localization.  Following this, a systematic arthroscopy was carried out.  I performed a synovectomy.  I  noted that the radiocarpal joint was stable.  The cartilaginous surfaces of the radial lunate joint and scaphoid radius joint known as the scaphoid fossa looked excellent.  The patient had membranous tearing of the scapholunate ligament and I debrided  this with a combination of arthroscopic shaver and ablator.  The patient had synovitis in the prestyloid recess, which was removed and some superficial fraying of the TFC but the TFC proper was intact.  There was no central tear or loose peripheral tear  that was incompetent.  This correlated with the MRI findings.  Following this, additional synovectomy was performed.  I heat treated  the capsule to prevent bleeding and then removed the arthroscopic instruments.  The patient tolerated this well.  The patient was placed out of traction and 18 mL of Sensorcaine  without epinephrine were  used for placement of this medicine in the subcutaneous tissue for postop pain relief.  The patient tolerated this well and there were no complicating features.  All sponge, needle and instrument counts reported correct.  She was dressed with Adaptic Xeroform, 4 x 4's, and a volar plaster splint.  We will see her back in the office in 12-14 days with therapy following.  These notes have been discussed and all  questions addressed.  We look forward to participating in her postoperative care, which will include range of motion between weeks 2-6 range of motion to the wrist gently with centralization exercises and at 6-8 weeks some strengthening predicated on  restoration of motion and fairly pain-free confines.  Given her findings, an operative intervention likely be 10-12 weeks until full restoration is realized in best case scenario.  All questions have been addressed.  There were no complicating features  immediate and all questions were encouraged and answered with the family.  TN/NUANCE  D:02/12/2018 T:02/12/2018 JOB:[id number]

## 2018-02-12 NOTE — H&P (Signed)
Deborah Watts is an 52 y.o. female.   Chief Complaint: Patient presents for right wrist arthroscopy and right carpal tunnel release HPI: Patient presents for right wrist arthroscopy with debridement repair is necessary and right carpal tunnel release.  She understands all risks and benefits.  Patient presents for evaluation and treatment of the of their upper extremity predicament. The patient denies neck, back, chest or  abdominal pain. The patient notes that they have no lower extremity problems. The patients primary complaint is noted. We are planning surgical care pathway for the upper extremity.  Past Medical History:  Diagnosis Date  . Anxiety   . Arthritis   . Asthma   . Headache    migraines  . History of hiatal hernia    [facility]  . Hypertension   . MHA (microangiopathic hemolytic anemia) (HCC)     Past Surgical History:  Procedure Laterality Date  . APPENDECTOMY N/A   . LAPAROSCOPIC CHOLECYSTECTOMY N/A 03/1992  . TOTAL ABDOMINAL HYSTERECTOMY Right   . TUBAL LIGATION      Family History  Problem Relation Age of Onset  . Throat cancer Father   . Asthma Unknown   . Diabetes Unknown   . Hyperlipidemia Unknown    Social History:  reports that she has never smoked. She has never used smokeless tobacco. She reports that she drinks alcohol. She reports that she does not use drugs.  Allergies:  Allergies  Allergen Reactions  . Fish Allergy Hives and Swelling  . Penicillins Anaphylaxis, Hives and Other (See Comments)    Has patient had a PCN reaction causing immediate rash, facial/tongue/throat swelling, SOB or lightheadedness with hypotension: Unknown Has patient had a PCN reaction causing severe rash involving mucus membranes or skin necrosis: No Has patient had a PCN reaction that required hospitalization: Yes Has patient had a PCN reaction occurring within the last 10 years: No If all of the above answers are "NO", then may proceed with  Cephalosporin use.   . Iohexol Other (See Comments)     Desc: hives, sob, pt. needs 13 hr prep   01/16/05   . Ivp Dye [Iodinated Diagnostic Agents] Hives and Swelling  . Morphine And Related Nausea And Vomiting    Medications Prior to Admission  Medication Sig Dispense Refill  . ALPRAZolam (XANAX) 0.5 MG tablet TAKE ONE TABLET BY MOUTH THREE TIMES DAILY FOR SLEEP OR ANXIETY 90 tablet 2  . Boswellia Serrata (BOSWELLIA PO) Take 1 capsule by mouth daily.    . diclofenac (VOLTAREN) 75 MG EC tablet Take 1 tablet (75 mg total) by mouth 2 (two) times daily. 60 tablet 3  . Meclizine HCl (TRAVEL SICKNESS) 25 MG CHEW CHEW 1 TABLET EVERY 6 HOURS AS NEEDED (Patient taking differently: Chew 25 mg by mouth every 6 (six) hours as needed (for vertigo). ) 45 each 2  . montelukast (SINGULAIR) 10 MG tablet Take 1 tablet (10 mg total) by mouth at bedtime. 90 tablet 3  . topiramate (TOPAMAX) 50 MG tablet Take 1 tablet (50 mg total) by mouth 2 (two) times daily. 180 tablet 3  . traMADol (ULTRAM) 50 MG tablet Take 1 tablet (50 mg total) by mouth every 8 (eight) hours as needed. (Patient taking differently: Take 50 mg by mouth every 8 (eight) hours as needed for moderate pain. ) 30 tablet 0  . Ubiquinol (QUNOL COQ10/UBIQUINOL/MEGA) 100 MG CAPS Take 100 mg by mouth daily.    . Albuterol Sulfate (PROAIR RESPICLICK)  108 (90 Base) MCG/ACT AEPB Inhale 2 puffs into the lungs every 6 (six) hours as needed. (Patient taking differently: Inhale 2 puffs into the lungs every 6 (six) hours as needed (for shortness of breath or wheezing). ) 3 each 3  . diclofenac sodium (VOLTAREN) 1 % GEL Apply 4 g topically 4 (four) times daily. (Patient not taking: Reported on 01/28/2018) 1 Tube 2  . nitroGLYCERIN (NITRODUR - DOSED IN MG/24 HR) 0.2 mg/hr patch Apply 1/4th patch to affected ankle, change daily (Patient not taking: Reported on 01/28/2018) 30 patch 1  . SUMAtriptan (IMITREX) 100 MG tablet TAKE 1 TABLET BY MOUTH AS NEEDED FOR MIGRAINE  AND REPEAT IN 2 HOURS AS NEEDED (Patient taking differently: Take 100 mg by mouth every 2 (two) hours as needed for migraine or headache. REPEAT IN 2 HOURS AS NEEDED) 12 tablet 1    No results found for this or any previous visit (from the past 48 hour(s)). No results found.  Review of Systems  Respiratory: Negative.   Cardiovascular: Negative.   Gastrointestinal: Negative.   Genitourinary: Negative.     Blood pressure 109/72, pulse 70, temperature 97.8 F (36.6 C), resp. rate 18, height 4\' 11"  (1.499 m), weight 76.1 kg, SpO2 98 %. Physical Exam    Patient has right carpal tunnel syndrome noted on her examination there is no signs of infection or dystrophy.  She also has right wrist pain chronic in nature-Planning a wrist arthroscopy and a carpal tunnel release  The patient is alert and oriented in no acute distress. The patient complains of pain in the affected upper extremity.  The patient is noted to have a normal HEENT exam. Lung fields show equal chest expansion and no shortness of breath. Abdomen exam is nontender without distention. Lower extremity examination does not show any fracture dislocation or blood clot symptoms. Pelvis is stable and the neck and back are stable and nontender. Assessment/Plan We are planning surgery for your upper extremity. The risk and benefits of surgery to include risk of bleeding, infection, anesthesia,  damage to normal structures and failure of the surgery to accomplish its intended goals of relieving symptoms and restoring function have been discussed in detail. With this in mind we plan to proceed. I have specifically discussed with the patient the pre-and postoperative regime and the dos and don'ts and risk and benefits in great detail. Risk and benefits of surgery also include risk of dystrophy(CRPS), chronic nerve pain, failure of the healing process to go onto completion and other inherent risks of surgery The relavent the pathophysiology of  the disease/injury process, as well as the alternatives for treatment and postoperative course of action has been discussed in great detail with the patient who desires to proceed.  We will do everything in our power to help you (the patient) restore function to the upper extremity. It is a pleasure to see this patient today.   Oletta Cohn III, MD 02/12/2018, 10:22 AM

## 2018-02-12 NOTE — Discharge Instructions (Signed)
Elevate move and massage fingers  We recommend that you to take vitamin C 1000 mg a day to promote healing. We also recommend that if you require  pain medicine that you take a stool softener to prevent constipation as most pain medicines will have constipation side effects. We recommend either Peri-Colace or Senokot and recommend that you also consider adding MiraLAX as well to prevent the constipation affects from pain medicine if you are required to use them. These medicines are over the counter and may be purchased at a local pharmacy. A cup of yogurt and a probiotic can also be helpful during the recovery process as the medicines can disrupt your intestinal environment. Keep bandage clean and dry.  Call for any problems.  No smoking.  Criteria for driving a car: you should be off your pain medicine for 7-8 hours, able to drive one handed(confident), thinking clearly and feeling able in your judgement to drive. Continue elevation as it will decrease swelling.  If instructed by MD move your fingers within the confines of the bandage/splint.  Use ice if instructed by your MD. Call immediately for any sudden loss of feeling in your hand/arm or change in functional abilities of the extremity.

## 2018-02-12 NOTE — Op Note (Signed)
See dictation#[id number] SP R CTR and R wrist Ascope with debridement Ashkan Chamberland MD

## 2018-02-12 NOTE — Transfer of Care (Signed)
Immediate Anesthesia Transfer of Care Note  Patient: Deborah Watts  Procedure(s) Performed: Right wrist arthroscopy, evaluation under anesthesia with debridement and repair as necessary and right carpal tunnel release (Right Wrist)  Patient Location: PACU  Anesthesia Type:General  Level of Consciousness: awake, alert  and pateint uncooperative  Airway & Oxygen Therapy: Patient Spontanous Breathing and Patient connected to face mask oxygen  Post-op Assessment: Report given to RN and Post -op Vital signs reviewed and stable  Post vital signs: Reviewed and stable  Last Vitals:  Vitals Value Taken Time  BP    Temp    Pulse 106 02/12/2018 12:08 PM  Resp 10 02/12/2018 12:08 PM  SpO2 100 % 02/12/2018 12:08 PM  Vitals shown include unvalidated device data.  Last Pain:  Vitals:   02/12/18 1208  PainSc: (P) 0-No pain      Patients Stated Pain Goal: 3 (02/12/18 0845)  Complications: No apparent anesthesia complications

## 2018-02-15 ENCOUNTER — Encounter (HOSPITAL_COMMUNITY): Payer: Self-pay | Admitting: Orthopedic Surgery

## 2018-02-15 NOTE — Anesthesia Postprocedure Evaluation (Signed)
Anesthesia Post Note  Patient: Vista Lawmanmanda S Skirvin  Procedure(s) Performed: Right wrist arthroscopy, evaluation under anesthesia with debridement and repair as necessary and right carpal tunnel release (Right Wrist)     Patient location during evaluation: PACU Anesthesia Type: General Level of consciousness: awake and alert Pain management: pain level controlled Vital Signs Assessment: post-procedure vital signs reviewed and stable Respiratory status: spontaneous breathing, nonlabored ventilation and respiratory function stable Cardiovascular status: blood pressure returned to baseline and stable Postop Assessment: no apparent nausea or vomiting Anesthetic complications: no    Last Vitals:  Vitals:   02/12/18 1238 02/12/18 1253  BP: 104/75 93/75  Pulse: 80 76  Resp: 16 12  Temp:    SpO2: 97% 98%    Last Pain:  Vitals:   02/12/18 1247  PainSc: 6                  Beryle Lathehomas E Annlee Glandon

## 2018-03-29 ENCOUNTER — Ambulatory Visit (INDEPENDENT_AMBULATORY_CARE_PROVIDER_SITE_OTHER): Payer: BLUE CROSS/BLUE SHIELD | Admitting: Family Medicine

## 2018-03-29 ENCOUNTER — Encounter: Payer: Self-pay | Admitting: Family Medicine

## 2018-03-29 VITALS — BP 128/70 | HR 74 | Temp 97.8°F | Resp 16 | Ht 59.0 in | Wt 175.0 lb

## 2018-03-29 DIAGNOSIS — M170 Bilateral primary osteoarthritis of knee: Secondary | ICD-10-CM

## 2018-03-29 DIAGNOSIS — Z8669 Personal history of other diseases of the nervous system and sense organs: Secondary | ICD-10-CM

## 2018-03-29 DIAGNOSIS — R109 Unspecified abdominal pain: Secondary | ICD-10-CM | POA: Diagnosis not present

## 2018-03-29 DIAGNOSIS — J45909 Unspecified asthma, uncomplicated: Secondary | ICD-10-CM

## 2018-03-29 DIAGNOSIS — Z0000 Encounter for general adult medical examination without abnormal findings: Secondary | ICD-10-CM

## 2018-03-29 DIAGNOSIS — Z79899 Other long term (current) drug therapy: Secondary | ICD-10-CM

## 2018-03-29 DIAGNOSIS — F411 Generalized anxiety disorder: Secondary | ICD-10-CM

## 2018-03-29 LAB — CBC WITH DIFFERENTIAL/PLATELET
Basophils Absolute: 0.1 10*3/uL (ref 0.0–0.1)
Basophils Relative: 1 % (ref 0.0–3.0)
EOS ABS: 0.4 10*3/uL (ref 0.0–0.7)
Eosinophils Relative: 5.3 % — ABNORMAL HIGH (ref 0.0–5.0)
HCT: 42 % (ref 36.0–46.0)
Hemoglobin: 14.1 g/dL (ref 12.0–15.0)
Lymphocytes Relative: 32.3 % (ref 12.0–46.0)
Lymphs Abs: 2.4 10*3/uL (ref 0.7–4.0)
MCHC: 33.5 g/dL (ref 30.0–36.0)
MCV: 87.7 fl (ref 78.0–100.0)
Monocytes Absolute: 0.4 10*3/uL (ref 0.1–1.0)
Monocytes Relative: 4.8 % (ref 3.0–12.0)
Neutro Abs: 4.2 10*3/uL (ref 1.4–7.7)
Neutrophils Relative %: 56.6 % (ref 43.0–77.0)
Platelets: 305 10*3/uL (ref 150.0–400.0)
RBC: 4.79 Mil/uL (ref 3.87–5.11)
RDW: 14.6 % (ref 11.5–15.5)
WBC: 7.4 10*3/uL (ref 4.0–10.5)

## 2018-03-29 LAB — COMPREHENSIVE METABOLIC PANEL
ALT: 22 U/L (ref 0–35)
AST: 16 U/L (ref 0–37)
Albumin: 4.4 g/dL (ref 3.5–5.2)
Alkaline Phosphatase: 67 U/L (ref 39–117)
BUN: 16 mg/dL (ref 6–23)
CO2: 28 mEq/L (ref 19–32)
Calcium: 9.9 mg/dL (ref 8.4–10.5)
Chloride: 110 mEq/L (ref 96–112)
Creatinine, Ser: 0.7 mg/dL (ref 0.40–1.20)
GFR: 92.77 mL/min (ref >60.00)
Glucose, Bld: 81 mg/dL (ref 70–99)
Potassium: 5.2 mEq/L — ABNORMAL HIGH (ref 3.5–5.1)
SODIUM: 144 meq/L (ref 135–145)
Total Bilirubin: 0.4 mg/dL (ref 0.2–1.2)
Total Protein: 6.8 g/dL (ref 6.0–8.3)

## 2018-03-29 LAB — LIPID PANEL
Cholesterol: 177 mg/dL (ref 0–200)
HDL: 45.2 mg/dL (ref >39.00)
LDL Cholesterol: 102 mg/dL — ABNORMAL HIGH (ref 0–99)
NonHDL: 131.71
Total CHOL/HDL Ratio: 4
Triglycerides: 147 mg/dL (ref 0.0–149.0)
VLDL: 29.4 mg/dL (ref 0.0–40.0)

## 2018-03-29 LAB — TSH: TSH: 0.76 u[IU]/mL (ref 0.35–4.50)

## 2018-03-29 MED ORDER — DICLOFENAC SODIUM 75 MG PO TBEC: 75.0000 mg | DELAYED_RELEASE_TABLET | Freq: Two times a day (BID) | ORAL | 3 refills | Status: DC

## 2018-03-29 MED ORDER — TRAMADOL HCL 50 MG PO TABS: 50.0000 mg | ORAL_TABLET | Freq: Three times a day (TID) | ORAL | 0 refills | Status: DC | PRN

## 2018-03-29 MED ORDER — MONTELUKAST SODIUM 10 MG PO TABS: 10.0000 mg | ORAL_TABLET | Freq: Every day | ORAL | 3 refills | Status: DC

## 2018-03-29 MED ORDER — SUMATRIPTAN SUCCINATE 100 MG PO TABS: ORAL_TABLET | ORAL | 1 refills | Status: DC

## 2018-03-29 MED ORDER — ALPRAZOLAM 0.5 MG PO TABS: ORAL_TABLET | ORAL | 2 refills | Status: DC

## 2018-03-29 MED ORDER — TOPIRAMATE 50 MG PO TABS: 50.0000 mg | ORAL_TABLET | Freq: Two times a day (BID) | ORAL | 3 refills | Status: DC

## 2018-03-29 NOTE — Progress Notes (Signed)
Subjective:     Deborah Watts is a 53 y.o. female and is here for a comprehensive physical exam. The patient reports no new problems ---  recovering from hand surgery with Dr Orlean PeaGramig .  Social History   Socioeconomic History  . Marital status: Married    Spouse name: Not on file  . Number of children: Not on file  . Years of education: Not on file  . Highest education level: Not on file  Occupational History  . Occupation: Administrator, [employer]    Employer: [employer] [place]  Social Needs  . Financial resource strain: Not on file  . Food insecurity:    Worry: Not on file    Inability: Not on file  . Transportation needs:    Medical: Not on file    Non-medical: Not on file  Tobacco Use  . Smoking status: Never Smoker  . Smokeless tobacco: Never Used  Substance and Sexual Activity  . Alcohol use: Yes    Alcohol/week: 0.0 standard drinks    Comment: occassional  . Drug use: No  . Sexual activity: Yes    Partners: Male  Lifestyle  . Physical activity:    Days per week: Not on file    Minutes per session: Not on file  . Stress: Not on file  Relationships  . Social connections:    Talks on phone: Not on file    Gets together: Not on file    Attends religious service: Not on file    Active member of club or organization: Not on file    Attends meetings of clubs or organizations: Not on file    Relationship status: Not on file  . Intimate partner violence:    Fear of current or ex partner: Not on file    Emotionally abused: Not on file    Physically abused: Not on file    Forced sexual activity: Not on file  Other Topics Concern  . Not on file  Social History Narrative   Exercise-- no   Health Maintenance  Topic Date Due  . COLONOSCOPY  06/28/2014  . MAMMOGRAM  03/27/2018  . TETANUS/TDAP  12/24/2023  . INFLUENZA VACCINE  Completed  . HIV Screening  Completed    The following portions of the patient's history were reviewed and updated as appropriate: She  has a past  medical history of Anxiety, Arthritis, Asthma, Headache, History of hiatal hernia, Hypertension, and MHA (microangiopathic hemolytic anemia) (HCC). She does not have any pertinent problems on file. She  has a past surgical history that includes Total abdominal hysterectomy (Right); Laparoscopic cholecystectomy (N/A, 03/1992); Appendectomy (N/A); Tubal ligation; and Wrist arthroscopy (Right, 02/12/2018). Her family history includes Asthma in an other family member; Diabetes in an other family member; Hyperlipidemia in an other family member; Throat cancer in her father. She  reports that she has never smoked. She has never used smokeless tobacco. She reports current alcohol use. She reports that she does not use drugs. She has a current medication list which includes the following prescription(s): albuterol sulfate, alprazolam, boswellia serrata, diclofenac, meclizine hcl, montelukast, sumatriptan, topiramate, tramadol, and turmeric. Current Outpatient Medications on File Prior to Visit  Medication Sig Dispense Refill  . Albuterol Sulfate (PROAIR RESPICLICK) 108 (90 Base) MCG/ACT AEPB Inhale 2 puffs into the lungs every 6 (six) hours as needed. (Patient taking differently: Inhale 2 puffs into the lungs every 6 (six) hours as needed (for shortness of breath or wheezing). ) 3 each 3  . Boswellia  Serrata (BOSWELLIA PO) Take 1 capsule by mouth daily.    . Meclizine HCl (TRAVEL SICKNESS) 25 MG CHEW CHEW 1 TABLET EVERY 6 HOURS AS NEEDED (Patient taking differently: Chew 25 mg by mouth every 6 (six) hours as needed (for vertigo). ) 45 each 2  . TURMERIC PO Take 1,000 mg by mouth daily.     No current facility-administered medications on file prior to visit.    She is allergic to fish allergy; penicillins; iohexol; ivp dye [iodinated diagnostic agents]; and morphine and related..  Review of Systems Review of Systems  Constitutional: Negative for activity change, appetite change and fatigue.  HENT:  Negative for hearing loss, congestion, tinnitus and ear discharge.  dentist q546m Eyes: Negative for visual disturbance (see optho q1y -- vision corrected to 20/20 with glasses).  Respiratory: Negative for cough, chest tightness and shortness of breath.   Cardiovascular: Negative for chest pain, palpitations and leg swelling.  Gastrointestinal: Negative for abdominal pain, diarrhea, constipation and abdominal distention.  Genitourinary: Negative for urgency, frequency, decreased urine volume and difficulty urinating.  Musculoskeletal: Negative for back pain, arthralgias and gait problem.  Skin: Negative for color change, pallor and rash.  Neurological: Negative for dizziness, light-headedness, numbness and headaches.  Hematological: Negative for adenopathy. Does not bruise/bleed easily.  Psychiatric/Behavioral: Negative for suicidal ideas, confusion, sleep disturbance, self-injury, dysphoric mood, decreased concentration and agitation.       Objective:    BP 128/70 (BP Location: Right Arm, Cuff Size: Normal)   Pulse 74   Temp 97.8 F (36.6 C) (Oral)   Resp 16   Ht 4\' 11"  (1.499 m)   Wt 175 lb (79.4 kg)   SpO2 98%   BMI 35.35 kg/m  General appearance: alert, cooperative, appears stated age and no distress Head: Normocephalic, without obvious abnormality, atraumatic Eyes: conjunctivae/corneas clear. PERRL, EOM's intact. Fundi benign. Ears: normal TM's and external ear canals both ears Nose: Nares normal. Septum midline. Mucosa normal. No drainage or sinus tenderness. Throat: lips, mucosa, and tongue normal; teeth and gums normal Neck: no adenopathy, no carotid bruit, no JVD, supple, symmetrical, trachea midline and thyroid not enlarged, symmetric, no tenderness/mass/nodules Back: symmetric, no curvature. ROM normal. No CVA tenderness. Lungs: clear to auscultation bilaterally Breasts: normal appearance, no masses or tenderness Heart: regular rate and rhythm, S1, S2 normal, no murmur,  click, rub or gallop Abdomen: soft, non-tender; bowel sounds normal; no masses,  no organomegaly Pelvic: not indicated; status post hysterectomy, negative ROS Extremities: extremities normal, atraumatic, no cyanosis or edema Pulses: 2+ and symmetric Skin: Skin color, texture, turgor normal. No rashes or lesions Lymph nodes: Cervical, supraclavicular, and axillary nodes normal. Neurologic: Alert and oriented X 3, normal strength and tone. Normal symmetric reflexes. Normal coordination and gait    Assessment:    Healthy female exam.      Plan:    ghm utd  Check labs  See After Visit Summary for Counseling Recommendations    1. Preventative health care See above - Lipid panel - CBC with Differential/Platelet - TSH - Comprehensive metabolic panel  2. migraines - traMADol (ULTRAM) 50 MG tablet; Take 1 tablet (50 mg total) by mouth every 8 (eight) hours as needed.  Dispense: 30 tablet; Refill: 0  3. Hx of migraines And ultram prn  - topiramate (TOPAMAX) 50 MG tablet; Take 1 tablet (50 mg total) by mouth 2 (two) times daily.  Dispense: 180 tablet; Refill: 3 - SUMAtriptan (IMITREX) 100 MG tablet; TAKE 1 TABLET BY MOUTH AS NEEDED  FOR MIGRAINE AND REPEAT IN 2 HOURS AS NEEDED  Dispense: 12 tablet; Refill: 1  4. Asthma, chronic, unspecified asthma severity, uncomplicated Stable -- con't meds  - montelukast (SINGULAIR) 10 MG tablet; Take 1 tablet (10 mg total) by mouth at bedtime.  Dispense: 90 tablet; : 3  5. Primary osteoarthritis of both knees  - diclofenac (VOLTAREN) 75 MG EC tablet; Take 1 tablet (75 mg total) by mouth 2 (two) times daily.  Dispense: 60 tablet; Refill: 3  6. Generalized anxiety disorder Stable  - ALPRAZolam (XANAX) 0.5 MG tablet; TAKE ONE TABLET BY MOUTH THREE TIMES DAILY FOR SLEEP OR ANXIETY  Dispense: 90 tablet; Refill: 2

## 2018-03-29 NOTE — Addendum Note (Signed)
Addended by: Thelma BargeICHARDSON, SHEKETIA D on: 03/29/2018 01:07 PM   Modules accepted: Orders

## 2018-03-29 NOTE — Patient Instructions (Signed)
Preventive Care 40-64 Years, Female Preventive care refers to lifestyle choices and visits with your health care provider that can promote health and wellness. What does preventive care include?   A yearly physical exam. This is also called an annual well check.  Dental exams once or twice a year.  Routine eye exams. Ask your health care provider how often you should have your eyes checked.  Personal lifestyle choices, including: ? Daily care of your teeth and gums. ? Regular physical activity. ? Eating a healthy diet. ? Avoiding tobacco and drug use. ? Limiting alcohol use. ? Practicing safe sex. ? Taking low-dose aspirin daily starting at age 50. ? Taking vitamin and mineral supplements as recommended by your health care provider. What happens during an annual well check? The services and screenings done by your health care provider during your annual well check will depend on your age, overall health, lifestyle risk factors, and family history of disease. Counseling Your health care provider may ask you questions about your:  Alcohol use.  Tobacco use.  Drug use.  Emotional well-being.  Home and relationship well-being.  Sexual activity.  Eating habits.  Work and work environment.  Method of birth control.  Menstrual cycle.  Pregnancy history. Screening You may have the following tests or measurements:  Height, weight, and BMI.  Blood pressure.  Lipid and cholesterol levels. These may be checked every 5 years, or more frequently if you are over 50 years old.  Skin check.  Lung cancer screening. You may have this screening every year starting at age 55 if you have a 30-pack-year history of smoking and currently smoke or have quit within the past 15 years.  Colorectal cancer screening. All adults should have this screening starting at age 50 and continuing until age 75. Your health care provider may recommend screening at age 45. You will have tests every  1-10 years, depending on your results and the type of screening test. People at increased risk should start screening at an earlier age. Screening tests may include: ? Guaiac-based fecal occult blood testing. ? Fecal immunochemical test (FIT). ? Stool DNA test. ? Virtual colonoscopy. ? Sigmoidoscopy. During this test, a flexible tube with a tiny camera (sigmoidoscope) is used to examine your rectum and lower colon. The sigmoidoscope is inserted through your anus into your rectum and lower colon. ? Colonoscopy. During this test, a long, thin, flexible tube with a tiny camera (colonoscope) is used to examine your entire colon and rectum.  Hepatitis C blood test.  Hepatitis B blood test.  Sexually transmitted disease (STD) testing.  Diabetes screening. This is done by checking your blood sugar (glucose) after you have not eaten for a while (fasting). You may have this done every 1-3 years.  Mammogram. This may be done every 1-2 years. Talk to your health care provider about when you should start having regular mammograms. This may depend on whether you have a family history of breast cancer.  BRCA-related cancer screening. This may be done if you have a family history of breast, ovarian, tubal, or peritoneal cancers.  Pelvic exam and Pap test. This may be done every 3 years starting at age 21. Starting at age 30, this may be done every 5 years if you have a Pap test in combination with an HPV test.  Bone density scan. This is done to screen for osteoporosis. You may have this scan if you are at high risk for osteoporosis. Discuss your test results, treatment options,   and if necessary, the need for more tests with your health care provider. Vaccines Your health care provider may recommend certain vaccines, such as:  Influenza vaccine. This is recommended every year.  Tetanus, diphtheria, and acellular pertussis (Tdap, Td) vaccine. You may need a Td booster every 10 years.  Varicella  vaccine. You may need this if you have not been vaccinated.  Zoster vaccine. You may need this after age 38.  Measles, mumps, and rubella (MMR) vaccine. You may need at least one dose of MMR if you were born in 1957 or later. You may also need a second dose.  Pneumococcal 13-valent conjugate (PCV13) vaccine. You may need this if you have certain conditions and were not previously vaccinated.  Pneumococcal polysaccharide (PPSV23) vaccine. You may need one or two doses if you smoke cigarettes or if you have certain conditions.  Meningococcal vaccine. You may need this if you have certain conditions.  Hepatitis A vaccine. You may need this if you have certain conditions or if you travel or work in places where you may be exposed to hepatitis A.  Hepatitis B vaccine. You may need this if you have certain conditions or if you travel or work in places where you may be exposed to hepatitis B.  Haemophilus influenzae type b (Hib) vaccine. You may need this if you have certain conditions. Talk to your health care provider about which screenings and vaccines you need and how often you need them. This information is not intended to replace advice given to you by your health care provider. Make sure you discuss any questions you have with your health care provider. Document Released: 03/30/2015 Document Revised: 04/23/2017 Document Reviewed: 01/02/2015 Elsevier Interactive Patient Education  2019 [employer].

## 2018-04-01 LAB — PAIN MGMT, PROFILE 8 W/CONF, U
6 Acetylmorphine: NEGATIVE ng/mL (ref <10)
ALPHAHYDROXYALPRAZOLAM: 195 ng/mL — AB (ref <25)
ALPHAHYDROXYTRIAZOLAM: NEGATIVE ng/mL (ref <50)
Alcohol Metabolites: NEGATIVE ng/mL (ref <500)
Alphahydroxymidazolam: NEGATIVE ng/mL (ref <50)
Aminoclonazepam: NEGATIVE ng/mL (ref <25)
Amphetamines: NEGATIVE ng/mL (ref <500)
Benzodiazepines: POSITIVE ng/mL — AB (ref <100)
Buprenorphine, Urine: NEGATIVE ng/mL (ref <5)
Cocaine Metabolite: NEGATIVE ng/mL (ref <150)
Creatinine: 98.1 mg/dL
Hydroxyethylflurazepam: NEGATIVE ng/mL (ref <50)
Lorazepam: NEGATIVE ng/mL (ref <50)
MDMA: NEGATIVE ng/mL (ref <500)
Marijuana Metabolite: NEGATIVE ng/mL (ref <20)
NORDIAZEPAM: NEGATIVE ng/mL (ref <50)
OPIATES: NEGATIVE ng/mL (ref <100)
Oxazepam: NEGATIVE ng/mL (ref <50)
Oxidant: NEGATIVE ug/mL (ref <200)
Oxycodone: NEGATIVE ng/mL (ref <100)
Temazepam: NEGATIVE ng/mL (ref <50)
pH: 6.99 (ref 4.5–9.0)

## 2018-04-01 LAB — PAIN MGMT, TRAMADOL W/MEDMATCH, U
Desmethyltramadol: NEGATIVE ng/mL (ref <100)
Tramadol: NEGATIVE ng/mL (ref <100)

## 2018-05-17 ENCOUNTER — Encounter: Payer: Self-pay | Admitting: Family Medicine

## 2018-05-17 ENCOUNTER — Ambulatory Visit: Payer: BLUE CROSS/BLUE SHIELD | Admitting: Family Medicine

## 2018-05-17 VITALS — BP 126/82 | HR 76 | Temp 98.0°F | Resp 12 | Ht 59.0 in | Wt 174.0 lb

## 2018-05-17 DIAGNOSIS — J4521 Mild intermittent asthma with (acute) exacerbation: Secondary | ICD-10-CM | POA: Diagnosis not present

## 2018-05-17 DIAGNOSIS — H8110 Benign paroxysmal vertigo, unspecified ear: Secondary | ICD-10-CM | POA: Diagnosis not present

## 2018-05-17 MED ORDER — METHYLPREDNISOLONE ACETATE 80 MG/ML IJ SUSP
80.0000 mg | Freq: Once | INTRAMUSCULAR | Status: AC
  Administered 2018-05-17: 80 mg via INTRAMUSCULAR

## 2018-05-17 MED ORDER — MECLIZINE HCL 25 MG PO CHEW: CHEWABLE_TABLET | ORAL | 2 refills | Status: DC

## 2018-05-17 MED ORDER — PROMETHAZINE-DM 6.25-15 MG/5ML PO SYRP: 5.0000 mL | ORAL_SOLUTION | Freq: Four times a day (QID) | ORAL | 0 refills | Status: DC | PRN

## 2018-05-17 MED ORDER — PREDNISONE 10 MG PO TABS: ORAL_TABLET | ORAL | 0 refills | Status: DC

## 2018-05-17 MED ORDER — DOXYCYCLINE HYCLATE 100 MG PO TABS: 100.0000 mg | ORAL_TABLET | Freq: Two times a day (BID) | ORAL | 0 refills | Status: DC

## 2018-05-17 NOTE — Progress Notes (Signed)
Patient ID: Deborah Watts, female    DOB: 10-12-1965  Age: 53 y.o. MRN: [id number]    Subjective:  Subjective  HPI Deborah Watts presents for wheezing and congestion x 2 weeks   Pt is using inhalers regularly.   +  Productive cough  +green / yellow mucus   No fevers   Review of Systems  Constitutional: Negative for chills and fever.  HENT: Negative for postnasal drip, rhinorrhea and sinus pressure.   Respiratory: Positive for cough, chest tightness, shortness of breath and wheezing.   Cardiovascular: Negative for chest pain, palpitations and leg swelling.  Allergic/Immunologic: Negative for environmental allergies.    History Past Medical History:  Diagnosis Date  . Anxiety   . Arthritis   . Asthma   . Headache    migraines  . History of hiatal hernia    repaired 30 yrs ago in [facility]  . Hypertension   . MHA (microangiopathic hemolytic anemia) (HCC)     She has a past surgical history that includes Total abdominal hysterectomy (Right); Laparoscopic cholecystectomy (N/A, 03/1992); Appendectomy (N/A); Tubal ligation; and Wrist arthroscopy (Right, 02/12/2018).   Her family history includes Asthma in an other family member; Diabetes in an other family member; Hyperlipidemia in an other family member; Throat cancer in her father.She reports that she has never smoked. She has never used smokeless tobacco. She reports current alcohol use. She reports that she does not use drugs.  Current Outpatient Medications on File Prior to Visit  Medication Sig Dispense Refill  . Albuterol Sulfate (PROAIR RESPICLICK) 108 (90 Base) MCG/ACT AEPB Inhale 2 puffs into the lungs every 6 (six) hours as needed. (Patient taking differently: Inhale 2 puffs into the lungs every 6 (six) hours as needed (for shortness of breath or wheezing). ) 3 each 3  . ALPRAZolam (XANAX) 0.5 MG tablet TAKE ONE TABLET BY MOUTH THREE TIMES DAILY FOR SLEEP OR ANXIETY 90 tablet 2  . Boswellia Serrata (BOSWELLIA PO) Take 1  capsule by mouth daily.    . diclofenac (VOLTAREN) 75 MG EC tablet Take 1 tablet (75 mg total) by mouth 2 (two) times daily. 60 tablet 3  . montelukast (SINGULAIR) 10 MG tablet Take 1 tablet (10 mg total) by mouth at bedtime. 90 tablet 3  . SUMAtriptan (IMITREX) 100 MG tablet TAKE 1 TABLET BY MOUTH AS NEEDED FOR MIGRAINE AND REPEAT IN 2 HOURS AS NEEDED 12 tablet 1  . topiramate (TOPAMAX) 50 MG tablet Take 1 tablet (50 mg total) by mouth 2 (two) times daily. 180 tablet 3  . traMADol (ULTRAM) 50 MG tablet Take 1 tablet (50 mg total) by mouth every 8 (eight) hours as needed. 30 tablet 0  . TURMERIC PO Take 1,000 mg by mouth daily.     No current facility-administered medications on file prior to visit.      Objective:  Objective  Physical Exam Vitals signs and nursing note reviewed.  Constitutional:      Appearance: She is well-developed.  HENT:     Right Ear: External ear normal.     Left Ear: External ear normal.     Nose: Congestion and rhinorrhea present.     Mouth/Throat:     Pharynx: No posterior oropharyngeal erythema.  Eyes:     General:        Right eye: No discharge.        Left eye: No discharge.     Conjunctiva/sclera: Conjunctivae normal.  Cardiovascular:  Rate and Rhythm: Normal rate and regular rhythm.     Heart sounds: Normal heart sounds. No murmur.  Pulmonary:     Effort: Pulmonary effort is normal. No respiratory distress.     Breath sounds: Wheezing and rhonchi present. No rales.  Chest:     Chest Malicoat: No tenderness.  Lymphadenopathy:     Cervical: Cervical adenopathy present.  Neurological:     Mental Status: She is alert and oriented to person, place, and time.    BP 126/82   Pulse 76   Temp 98 F (36.7 C)   Resp 12   Ht 4\' 11"  (1.499 m)   Wt 174 lb (78.9 kg)   SpO2 96%   BMI 35.14 kg/m  Wt Readings from Last 3 Encounters:  05/17/18 174 lb (78.9 kg)  03/29/18 175 lb (79.4 kg)  02/12/18 167 lb 11.2 oz (76.1 kg)     Lab Results    Component Value Date   WBC 7.4 03/29/2018   HGB 14.1 03/29/2018   HCT 42.0 03/29/2018   PLT 305.0 03/29/2018   GLUCOSE 81 03/29/2018   CHOL 177 03/29/2018   TRIG 147.0 03/29/2018   HDL 45.20 03/29/2018   LDLDIRECT 124.0 03/27/2017   LDLCALC 102 (H) 03/29/2018   ALT 22 03/29/2018   AST 16 03/29/2018   NA 144 03/29/2018   K 5.2 (H) 03/29/2018   CL 110 03/29/2018   CREATININE 0.70 03/29/2018   BUN 16 03/29/2018   CO2 28 03/29/2018   TSH 0.76 03/29/2018    No results found.   Assessment & Plan:  Plan  I am having Stan Head. Gee start on doxycycline, predniSONE, and promethazine-dextromethorphan. I am also having her maintain her Albuterol Sulfate, Boswellia Serrata (BOSWELLIA PO), TURMERIC PO, traMADol, topiramate, SUMAtriptan, montelukast, diclofenac, ALPRAZolam, and Meclizine HCl. We administered methylPREDNISolone acetate.  Meds ordered this encounter  Medications  . doxycycline (VIBRA-TABS) 100 MG tablet    Sig: Take 1 tablet (100 mg total) by mouth 2 (two) times daily.    Dispense:  20 tablet    Refill:  0  . predniSONE (DELTASONE) 10 MG tablet    Sig: TAKE 3 TABLETS PO QD FOR 3 DAYS THEN TAKE 2 TABLETS PO QD FOR 3 DAYS THEN TAKE 1 TABLET PO QD FOR 3 DAYS THEN TAKE 1/2 TAB PO QD FOR 3 DAYS    Dispense:  20 tablet    Refill:  0  . Meclizine HCl (TRAVEL SICKNESS) 25 MG CHEW    Sig: CHEW 1 TABLET EVERY 6 HOURS AS NEEDED    Dispense:  45 each    Refill:  2  . promethazine-dextromethorphan (PROMETHAZINE-DM) 6.25-15 MG/5ML syrup    Sig: Take 5 mLs by mouth 4 (four) times daily as needed.    Dispense:  118 mL    Refill:  0  . methylPREDNISolone acetate (DEPO-MEDROL) injection 80 mg    Problem List Items Addressed This Visit    None    Visit Diagnoses    Mild intermittent asthmatic bronchitis with acute exacerbation    -  Primary   Relevant Medications   doxycycline (VIBRA-TABS) 100 MG tablet   predniSONE (DELTASONE) 10 MG tablet   promethazine-dextromethorphan  (PROMETHAZINE-DM) 6.25-15 MG/5ML syrup   methylPREDNISolone acetate (DEPO-MEDROL) injection 80 mg (Completed)   Benign paroxysmal positional vertigo, unspecified laterality       Relevant Medications   Meclizine HCl (TRAVEL SICKNESS) 25 MG CHEW      Follow-up: Return if symptoms worsen or  fail to improve.  Ann Held, DO

## 2018-05-17 NOTE — Patient Instructions (Signed)

## 2018-05-27 ENCOUNTER — Encounter: Payer: Self-pay | Admitting: Family Medicine

## 2018-05-28 ENCOUNTER — Other Ambulatory Visit: Payer: Self-pay | Admitting: Family Medicine

## 2018-05-28 ENCOUNTER — Telehealth: Payer: Self-pay | Admitting: Family Medicine

## 2018-05-28 DIAGNOSIS — J45909 Unspecified asthma, uncomplicated: Secondary | ICD-10-CM

## 2018-05-28 MED ORDER — ALBUTEROL SULFATE 108 (90 BASE) MCG/ACT IN AEPB: 2.0000 | INHALATION_SPRAY | Freq: Four times a day (QID) | RESPIRATORY_TRACT | 12 refills | Status: DC | PRN

## 2018-05-28 MED ORDER — LEVOFLOXACIN 500 MG PO TABS: 500.0000 mg | ORAL_TABLET | Freq: Every day | ORAL | 0 refills | Status: DC

## 2018-05-28 MED ORDER — MOMETASONE FURO-FORMOTEROL FUM 200-5 MCG/ACT IN AERO: 2.0000 | INHALATION_SPRAY | Freq: Two times a day (BID) | RESPIRATORY_TRACT | 5 refills | Status: DC

## 2018-05-28 NOTE — Telephone Encounter (Signed)
Copied from CRM [id number]. Topic: Quick Communication - See Telephone Encounter >> May 28, 2018  9:18 AM Jolayne Haines L wrote: CRM for notification. See Telephone encounter for: 05/28/18.  Patient said she was in 3/2 and was given predniSONE (DELTASONE) 10 MG tablet & doxycycline (VIBRA-TABS) 100 MG tablet/// she said her ears are still hurting and its draining in her throat. She has finished both rounds of these medications. She wants to know should she come back in for another OV or could something be called in?

## 2018-05-28 NOTE — Telephone Encounter (Signed)
Spoke w/ Pt- informed Levaquin has been sent to [facility]. Pt verbalized understanding.

## 2018-05-28 NOTE — Telephone Encounter (Signed)
Please advise 

## 2018-05-28 NOTE — Telephone Encounter (Signed)
levaquin 500 mg #10 1 po qd

## 2018-07-19 ENCOUNTER — Other Ambulatory Visit: Payer: Self-pay | Admitting: Family Medicine

## 2018-07-19 ENCOUNTER — Encounter: Payer: Self-pay | Admitting: Family Medicine

## 2018-07-19 DIAGNOSIS — R109 Unspecified abdominal pain: Secondary | ICD-10-CM

## 2018-07-19 DIAGNOSIS — F411 Generalized anxiety disorder: Secondary | ICD-10-CM

## 2018-07-19 MED ORDER — TRAMADOL HCL 50 MG PO TABS: 50.0000 mg | ORAL_TABLET | Freq: Three times a day (TID) | ORAL | 0 refills | Status: DC | PRN

## 2018-07-19 MED ORDER — ALPRAZOLAM 0.5 MG PO TABS: ORAL_TABLET | ORAL | 2 refills | Status: DC

## 2018-07-19 NOTE — Telephone Encounter (Signed)
Requesting: Xanax Contract: 09/10/2017 UDS: 03/29/2018, low risk Last OV: 05/17/2018 Next OV: 03/31/2019 Last Refill: 03/29/2018, #90-- 2 RF Database:   Requesting: Tramadol  Contract: 09/10/2017 UDS: 03/29/2018, low risk Last OV: 05/17/2018 Next OV: 03/31/2019 Last Refill: 03/29/2018, #30--0 RF Database:   Please advise

## 2018-08-24 ENCOUNTER — Other Ambulatory Visit: Payer: Self-pay

## 2018-08-24 ENCOUNTER — Ambulatory Visit: Payer: Self-pay | Admitting: Family Medicine

## 2018-08-24 ENCOUNTER — Ambulatory Visit (INDEPENDENT_AMBULATORY_CARE_PROVIDER_SITE_OTHER): Payer: BC Managed Care – PPO | Admitting: Medical

## 2018-08-24 ENCOUNTER — Encounter: Payer: Self-pay | Admitting: Medical

## 2018-08-24 DIAGNOSIS — J4521 Mild intermittent asthma with (acute) exacerbation: Secondary | ICD-10-CM | POA: Diagnosis not present

## 2018-08-24 DIAGNOSIS — J3089 Other allergic rhinitis: Secondary | ICD-10-CM

## 2018-08-24 DIAGNOSIS — J019 Acute sinusitis, unspecified: Secondary | ICD-10-CM

## 2018-08-24 MED ORDER — PREDNISONE 10 MG (21) PO TBPK: ORAL_TABLET | ORAL | 0 refills | Status: DC

## 2018-08-24 MED ORDER — NYSTATIN 100000 UNIT/ML MT SUSP: 5.0000 mL | Freq: Four times a day (QID) | OROMUCOSAL | 0 refills | Status: DC

## 2018-08-24 MED ORDER — DOXYCYCLINE HYCLATE 100 MG PO TABS: 100.0000 mg | ORAL_TABLET | Freq: Two times a day (BID) | ORAL | 0 refills | Status: DC

## 2018-08-24 NOTE — Telephone Encounter (Signed)
Virtual visit scheduled.  

## 2018-08-24 NOTE — Progress Notes (Signed)
   Subjective:    Patient ID: Deborah Watts, female    DOB: 1966/03/16, 53 y.o.   MRN: [id number]  HPI  Virtual Visit via Video Note  I connected with Deborah Watts on 08/24/18 at 10:40 AM EDT by a video enabled telemedicine application and verified that I am speaking with the correct person using two identifiers.  Location: Patient: home Provider: home  Pt did not check bp or pulse today.   I discussed the limitations of evaluation and management by telemedicine and the availability of in person appointments. The patient expressed understanding and agreed to proceed.   History of Present Illness:  Pt had nasal congestion for about one week. She has allergy hx. Using zyrtec and flonase but nasal congestion. Pt taking elder berry. She is also on dulera.She states frontal and sinus pressure recently. She has some sneezing and post nasal drainage and some ear pressure. No cough presently. When blows nose gets little colored mucus.  Pt is not diabetic.    Observations/Objective:  General-no acute distress, pleasant, oriented. Lungs- on inspection lungs appear unlabored. Neck- no tracheal deviation or jvd on inspection. Neuro- gross motor function appears intact. Heent- sinus pressure frontal and maxillary.    Assessment and Plan: History of allergies with probable sinus infection. Will advise continue zyrtec, flonase and adding tapered 6 day prednisone.  For sinus infection I am prescribing doxycycline antibiotic.  Rx advised and given.  Patient does report history of thrush when using her inhaler and that she will be on antibiotic as well as prednisone.  So prescribing nystatin.  I discussed with patient that if her signs symptoms worsen despite the above measures then I would recommend her taking some time off of work and getting covid testing.  Presently I do not think it is indicated.  Follow-up in 7 days or as needed.  Mackie Pai, PA-C  Follow Up Instructions:     I discussed the assessment and treatment plan with the patient. The patient was provided an opportunity to ask questions and all were answered. The patient agreed with the plan and demonstrated an understanding of the instructions.   The patient was advised to call back or seek an in-person evaluation if the symptoms worsen or if the condition fails to improve as anticipated.     Mackie Pai, PA-C    Review of Systems  Constitutional: Negative for chills, fatigue and fever.  HENT: Positive for congestion, postnasal drip, sinus pressure and sinus pain. Negative for facial swelling and sore throat.   Respiratory: Negative for cough, chest tightness, shortness of breath and wheezing.   Cardiovascular: Negative for chest pain and palpitations.  Gastrointestinal: Negative for abdominal pain.  Musculoskeletal: Negative for back pain.  Skin: Negative for rash.  Neurological: Negative for dizziness, weakness, numbness and headaches.  Hematological: Negative for adenopathy. Does not bruise/bleed easily.       Objective:   Physical Exam        Assessment & Plan:

## 2018-08-24 NOTE — Telephone Encounter (Signed)
Pt. Reports she started having sinus symptoms 1 week ago. Facial pain and pressure. Green and bloody discharge from nose. Has tried Sudafed without relief. No fever. Warm transfer to [facility] in the practice for a virtual visit.   Answer Assessment - Initial Assessment Questions 1. LOCATION: "Where does it hurt?"      Eyes and face 2. ONSET: "When did the sinus pain start?"  (e.g., hours, days)      1 week ago 3. SEVERITY: "How bad is the pain?"   (Scale 1-10; mild, moderate or severe)   - MILD (1-3): doesn't interfere with normal activities    - MODERATE (4-7): interferes with normal activities (e.g., work or school) or awakens from sleep   - SEVERE (8-10): excruciating pain and patient unable to do any normal activities        4 4. RECURRENT SYMPTOM: "Have you ever had sinus problems before?" If so, ask: "When was the last time?" and "What happened that time?"      Yes 5. NASAL CONGESTION: "Is the nose blocked?" If so, ask, "Can you open it or must you breathe through the mouth?"     Can breath 6. NASAL DISCHARGE: "Do you have discharge from your nose?" If so ask, "What color?"     Green with blood 7. FEVER: "Do you have a fever?" If so, ask: "What is it, how was it measured, and when did it start?"      No 8. OTHER SYMPTOMS: "Do you have any other symptoms?" (e.g., sore throat, cough, earache, difficulty breathing)     Ear pain 9. PREGNANCY: "Is there any chance you are pregnant?" "When was your last menstrual period?"     No  Protocols used: SINUS PAIN OR CONGESTION-A-AH

## 2018-08-24 NOTE — Patient Instructions (Signed)
History of allergies with probable sinus infection. Will advise continue zyrtec, flonase and adding tapered 6 day prednisone.  For sinus infection I am prescribing doxycycline antibiotic.  Rx advised and given.  Patient does report history of thrush when using her inhaler and that she will be on antibiotic as well as prednisone.  So prescribing nystatin.  I discussed with patient that if her signs symptoms worsen despite the above measures then I would recommend her taking some time off of work and getting covid testing.  Presently I do not think it is indicated.  Follow-up in 7 days or as needed.

## 2018-09-30 ENCOUNTER — Encounter: Payer: Self-pay | Admitting: Family Medicine

## 2018-09-30 ENCOUNTER — Other Ambulatory Visit: Payer: Self-pay | Admitting: Family Medicine

## 2018-09-30 DIAGNOSIS — M170 Bilateral primary osteoarthritis of knee: Secondary | ICD-10-CM

## 2018-09-30 MED ORDER — FLUCONAZOLE 150 MG PO TABS: ORAL_TABLET | ORAL | 2 refills | Status: DC

## 2018-09-30 MED ORDER — DICLOFENAC SODIUM 75 MG PO TBEC: 75.0000 mg | DELAYED_RELEASE_TABLET | Freq: Two times a day (BID) | ORAL | 3 refills | Status: DC

## 2018-10-18 ENCOUNTER — Ambulatory Visit: Payer: Self-pay | Admitting: *Deleted

## 2018-10-18 NOTE — Telephone Encounter (Signed)
Spoke patient.  Appointment made for tomorrow at 11am.  Patient was a little upset about not getting a call back this morning.  Advised her that nurse did call her right back and left a voicemail.

## 2018-10-18 NOTE — Telephone Encounter (Signed)
Notified Sheketa of pt's triage.

## 2018-10-18 NOTE — Telephone Encounter (Signed)
Pt states that she fell in the tub last night; she started having pain when she breathes; she says that both sides hurt; the pt has taken 1/2 tramadol on 10/18/2018  And Tylenol 650 mg at 0500; she rates her pain at 7 out 10, and the medication did not relieve her pain; she also has pain across her upper back; phone disconnected before pt could be given instructions; will attempt to contact pt.  Reason for Disposition . Large swelling or bruise > 2 inches (5 cm)  Answer Assessment - Initial Assessment Questions 1. MECHANISM: "How did the injury happen?" (Consider the possibility of domestic violence or elder abuse)     Pt feel in bathtub 2. ONSET: "When did the injury happen?" (Minutes or hours ago)    10/17/2018 at 2030 3. LOCATION: "What part of the back is injured?"   Across upper back 4. SEVERITY: "Can you move the back normally?"     yes 5. PAIN: "Is there any pain?" If so, ask: "How bad is the pain?"   (Scale 1-10; or mild, moderate, severe)   7 out of 10 6. CORD SYMPTOMS: Any weakness or numbness of the arms or legs?"    no 7. SIZE: For cuts, bruises, or swelling, ask: "How large is it?" (e.g., inches or centimeters)     bruises'knots to knee; nickel sized  8. TETANUS: For any breaks in the skin, ask: "When was the last tetanus booster?"    no 9. OTHER SYMPTOMS: "Do you have any other symptoms?" (e.g., abdominal pain, blood in urine)     Pain in both sides 10. PREGNANCY: "Is there any chance you are pregnant?" "When was your last menstrual period?"      No hysterectomy  Protocols used: BACK INJURY-A-AH

## 2018-10-18 NOTE — Telephone Encounter (Signed)
Attempted to contact pt; left recommendations on voicemail; unable to contact flow coordinator at [facility] by phone; spoke with Steffanie Dunn, and she will send message to Dr Nonda Lou assistant; will route to office for notification and final disposition.

## 2018-10-19 ENCOUNTER — Ambulatory Visit (HOSPITAL_BASED_OUTPATIENT_CLINIC_OR_DEPARTMENT_OTHER)
Admission: RE | Admit: 2018-10-19 | Discharge: 2018-10-19 | Disposition: A | Discharge status: Home / Self Care | Payer: BC Managed Care – PPO | Source: Ambulatory Visit | Attending: Family Medicine | Admitting: Family Medicine

## 2018-10-19 ENCOUNTER — Encounter: Payer: Self-pay | Admitting: Family Medicine

## 2018-10-19 ENCOUNTER — Ambulatory Visit: Payer: BC Managed Care – PPO | Admitting: Family Medicine

## 2018-10-19 ENCOUNTER — Other Ambulatory Visit: Payer: Self-pay

## 2018-10-19 VITALS — BP 117/60 | HR 62 | Temp 97.9°F | Resp 18 | Ht 59.0 in | Wt 179.6 lb

## 2018-10-19 DIAGNOSIS — R109 Unspecified abdominal pain: Secondary | ICD-10-CM | POA: Diagnosis not present

## 2018-10-19 DIAGNOSIS — R0781 Pleurodynia: Secondary | ICD-10-CM | POA: Insufficient documentation

## 2018-10-19 MED ORDER — TRAMADOL HCL 50 MG PO TABS: 50.0000 mg | ORAL_TABLET | Freq: Three times a day (TID) | ORAL | 0 refills | Status: DC | PRN

## 2018-10-19 NOTE — Patient Instructions (Signed)
Rib Contusion A rib contusion is a deep bruise on your rib area. Contusions are the result of a blunt trauma that causes bleeding and injury to the tissues under the skin. A rib contusion may involve bruising of the ribs and of the skin and muscles in the area. The skin over the contusion may turn blue, purple, or yellow. Minor injuries will give you a painless contusion. More severe contusions may stay painful and swollen for a few weeks. What are the causes? This condition is usually caused by a blow, trauma, or direct force to an area of the body. This often occurs while playing contact sports. What are the signs or symptoms? Symptoms of this condition include:  Swelling and redness of the injured area.  Discoloration of the injured area.  Tenderness and soreness of the injured area.  Pain with or without movement. How is this diagnosed? This condition may be diagnosed based on:  Your symptoms and medical history.  A physical exam.  Imaging tests-such as an X-ray, CT scan, or MRI-to determine if there were internal injuries or broken bones (fractures). How is this treated? This condition may be treated with:  Rest. This is often the best treatment for a rib contusion.  Icing. This reduces swelling and inflammation.  Deep-breathing exercises. These may be recommended to reduce the risk for lung collapse and pneumonia.  Medicines. Over-the-counter or prescription medicines may be given to control pain.  Injection of a numbing medicine around the nerve near your injury (nerve block). Follow these instructions at home:     Medicines  Take over-the-counter and prescription medicines only as told by your health care provider.  Do not drive or use heavy machinery while taking prescription pain medicine.  If you are taking prescription pain medicine, take actions to prevent or treat constipation. Your health care provider may recommend that you: ? Drink enough fluid to keep  your urine pale yellow. ? Eat foods that are high in fiber, such as fresh fruits and vegetables, whole grains, and beans. ? Limit foods that are high in fat and processed sugars, such as fried or sweet foods. ? Take an over-the-counter or prescription medicine for constipation. Managing pain, stiffness, and swelling  If directed, put ice on the injured area: ? Put ice in a plastic bag. ? Place a towel between your skin and the bag. ? Leave the ice on for 20 minutes, 2-3 times a day.  Rest the injured area. Avoid strenuous activity and any activities or movements that cause pain. Be careful during activities and avoid bumping the injured area.  Do not lift anything that is heavier than 5 lb (2.3 kg), or the limit that you are told, until your health care provider says that it is safe. General instructions  Do not use any products that contain nicotine or tobacco, such as cigarettes and e-cigarettes. These can delay healing. If you need help quitting, ask your health care provider.  Do deep-breathing exercises as told by your health care provider.  If you were given an incentive spirometer, use it every 1-2 hours while you are awake, or as recommended by your health care provider. This device measures how well you are filling your lungs with each breath.  Keep all follow-up visits as told by your health care provider. This is important. Contact a health care provider if you have:  Increased bruising or swelling.  Pain that is not controlled with treatment.  A fever. Get help right away if   you:  Have difficulty breathing or shortness of breath.  Develop a continual cough or you cough up thick or bloody sputum.  Feel nauseous or you vomit.  Have pain in your abdomen. Summary  A rib contusion is a deep bruise on your rib area. Contusions are the result of a blunt trauma that causes bleeding and injury to the tissues under the skin.  The skin overlying the contusion may turn  blue, purple, or yellow. Minor injuries may give you a painless contusion. More severe contusions may stay painful and swollen for a few weeks.  Rest the injured area. Avoid strenuous activity and any activities or movements that cause pain. This information is not intended to replace advice given to you by your health care provider. Make sure you discuss any questions you have with your health care provider. Document Released: 11/26/2000 Document Revised: 04/01/2017 Document Reviewed: 04/01/2017 Elsevier Patient Education  2020 Elsevier Inc.  

## 2018-10-19 NOTE — Progress Notes (Signed)
Patient ID: Deborah Watts, female    DOB: [birth year]  Age: 52 y.o. MRN: [id number]    Subjective:  Subjective  HPI Deborah Watts presents for f/u fall 2 days ago -- she slipped and fell in the bathtub and c/o back pain with radiation down L leg.  No weakness in the legs.  No pain with walking   Review of Systems  Constitutional: Negative for appetite change, diaphoresis, fatigue and unexpected weight change.  Eyes: Negative for pain, redness and visual disturbance.  Respiratory: Negative for cough, chest tightness, shortness of breath and wheezing.   Cardiovascular: Negative for chest pain, palpitations and leg swelling.  Endocrine: Negative for cold intolerance, heat intolerance, polydipsia, polyphagia and polyuria.  Genitourinary: Negative for difficulty urinating, dysuria and frequency.  Musculoskeletal: Positive for back pain.  Neurological: Negative for dizziness, light-headedness, numbness and headaches.    History Past Medical History:  Diagnosis Date  . Anxiety   . Arthritis   . Asthma   . Headache    migraines  . History of hiatal hernia    repaired 30 yrs ago in [facility]  . Hypertension   . MHA (microangiopathic hemolytic anemia) (HCC)     She has a past surgical history that includes Total abdominal hysterectomy (Right); Laparoscopic cholecystectomy (N/A, 03/1992); Appendectomy (N/A); Tubal ligation; and Wrist arthroscopy (Right, 02/12/2018).   Her family history includes Asthma in an other family member; Diabetes in an other family member; Hyperlipidemia in an other family member; Throat cancer in her father.She reports that she has never smoked. She has never used smokeless tobacco. She reports current alcohol use. She reports that she does not use drugs.  Current Outpatient Medications on File Prior to Visit  Medication Sig Dispense Refill  . Albuterol Sulfate (PROAIR RESPICLICK) 211 (90 Base) MCG/ACT AEPB Inhale 2 puffs into the lungs every 6 (six) hours as  needed (for shortness of breath or wheezing). 1 each 12  . ALPRAZolam (XANAX) 0.5 MG tablet TAKE ONE TABLET BY MOUTH THREE TIMES DAILY FOR SLEEP OR ANXIETY 90 tablet 2  . Boswellia Serrata (BOSWELLIA PO) Take 1 capsule by mouth daily.    . diclofenac (VOLTAREN) 75 MG EC tablet Take 1 tablet (75 mg total) by mouth 2 (two) times daily. 60 tablet 3  . levofloxacin (LEVAQUIN) 500 MG tablet Take 1 tablet (500 mg total) by mouth daily. 10 tablet 0  . Meclizine HCl (TRAVEL SICKNESS) 25 MG CHEW CHEW 1 TABLET EVERY 6 HOURS AS NEEDED 45 each 2  . mometasone-formoterol (DULERA) 200-5 MCG/ACT AERO Inhale 2 puffs into the lungs 2 (two) times daily. 1 Inhaler 5  . montelukast (SINGULAIR) 10 MG tablet Take 1 tablet (10 mg total) by mouth at bedtime. 90 tablet 3  . nystatin (MYCOSTATIN) 100000 UNIT/ML suspension Take 5 mLs (500,000 Units total) by mouth 4 (four) times daily. 60 mL 0  . SUMAtriptan (IMITREX) 100 MG tablet TAKE 1 TABLET BY MOUTH AS NEEDED FOR MIGRAINE AND REPEAT IN 2 HOURS AS NEEDED 12 tablet 1  . topiramate (TOPAMAX) 50 MG tablet Take 1 tablet (50 mg total) by mouth 2 (two) times daily. 180 tablet 3  . TURMERIC PO Take 1,000 mg by mouth daily.    . predniSONE (STERAPRED UNI-PAK 21 TAB) 10 MG (21) TBPK tablet Taper over 6 days (Patient not taking: Reported on 10/19/2018) 21 tablet 0  . promethazine-dextromethorphan (PROMETHAZINE-DM) 6.25-15 MG/5ML syrup Take 5 mLs by mouth 4 (four) times daily as needed. (Patient not taking:  Reported on 10/19/2018) 118 mL 0   No current facility-administered medications on file prior to visit.      Objective:  Objective  Physical Exam Vitals signs and nursing note reviewed.  Constitutional:      Appearance: She is well-developed.  HENT:     Head: Normocephalic and atraumatic.  Eyes:     Conjunctiva/sclera: Conjunctivae normal.  Neck:     Musculoskeletal: Normal range of motion and neck supple.     Thyroid: No thyromegaly.     Vascular: No carotid bruit or  JVD.  Cardiovascular:     Rate and Rhythm: Normal rate and regular rhythm.     Heart sounds: Normal heart sounds. No murmur.  Pulmonary:     Effort: Pulmonary effort is normal. No respiratory distress.     Breath sounds: Normal breath sounds. No wheezing or rales.  Chest:     Chest Lietzke: No tenderness.  Musculoskeletal:        General: Tenderness present.     Lumbar back: She exhibits tenderness.       Back:  Skin:    Findings: Bruising present. No erythema.  Neurological:     Mental Status: She is alert and oriented to person, place, and time.    BP 117/60 (BP Location: Left Arm, Patient Position: Sitting, Cuff Size: Normal)   Pulse 62   Temp 97.9 F (36.6 C) (Oral)   Resp 18   Ht 4\' 11"  (1.499 m)   Wt 179 lb 9.6 oz (81.5 kg)   SpO2 100%   BMI 36.27 kg/m  Wt Readings from Last 3 Encounters:  10/19/18 179 lb 9.6 oz (81.5 kg)  05/17/18 174 lb (78.9 kg)  03/29/18 175 lb (79.4 kg)     Lab Results  Component Value Date   WBC 7.4 03/29/2018   HGB 14.1 03/29/2018   HCT 42.0 03/29/2018   PLT 305.0 03/29/2018   GLUCOSE 81 03/29/2018   CHOL 177 03/29/2018   TRIG 147.0 03/29/2018   HDL 45.20 03/29/2018   LDLDIRECT 124.0 03/27/2017   LDLCALC 102 (H) 03/29/2018   ALT 22 03/29/2018   AST 16 03/29/2018   NA 144 03/29/2018   K 5.2 (H) 03/29/2018   CL 110 03/29/2018   CREATININE 0.70 03/29/2018   BUN 16 03/29/2018   CO2 28 03/29/2018   TSH 0.76 03/29/2018    No results found.   Assessment & Plan:  Plan  I have discontinued Stan Headmanda S. Royer's doxycycline, doxycycline, and fluconazole. I am also having her maintain her Boswellia Serrata (BOSWELLIA PO), TURMERIC PO, topiramate, SUMAtriptan, montelukast, Meclizine HCl, promethazine-dextromethorphan, levofloxacin, Albuterol Sulfate, mometasone-formoterol, ALPRAZolam, predniSONE, nystatin, diclofenac, and traMADol.  Meds ordered this encounter  Medications  . traMADol (ULTRAM) 50 MG tablet    Sig: Take 1 tablet (50 mg  total) by mouth every 8 (eight) hours as needed.    Dispense:  30 tablet    Refill:  0   1. Rib pain Check xray Suspect contusion  Moist heat Pt has tramadol she can use if needed  Call or rto prn    Follow-up: Return if symptoms worsen or fail to improve.  Donato SchultzYvonne R Lowne Chase, DO

## 2018-10-19 NOTE — Assessment & Plan Note (Signed)
B/l pain  Pain with inspiration Check xray Suspect contusion  Moist heat

## 2018-11-03 ENCOUNTER — Other Ambulatory Visit: Payer: Self-pay | Admitting: Medical

## 2018-11-03 ENCOUNTER — Other Ambulatory Visit: Payer: Self-pay | Admitting: Family Medicine

## 2018-11-03 ENCOUNTER — Encounter: Payer: Self-pay | Admitting: Family Medicine

## 2018-11-03 ENCOUNTER — Telehealth: Payer: Self-pay | Admitting: *Deleted

## 2018-11-03 DIAGNOSIS — R109 Unspecified abdominal pain: Secondary | ICD-10-CM

## 2018-11-03 DIAGNOSIS — F411 Generalized anxiety disorder: Secondary | ICD-10-CM

## 2018-11-03 MED ORDER — ALPRAZOLAM 0.5 MG PO TABS: ORAL_TABLET | ORAL | 2 refills | Status: DC

## 2018-11-03 MED ORDER — TRAMADOL HCL 50 MG PO TABS: 50.0000 mg | ORAL_TABLET | Freq: Three times a day (TID) | ORAL | 0 refills | Status: DC | PRN

## 2018-11-03 MED ORDER — NYSTATIN 100000 UNIT/ML MT SUSP: 5.0000 mL | Freq: Four times a day (QID) | OROMUCOSAL | 0 refills | Status: DC

## 2018-11-03 NOTE — Telephone Encounter (Signed)
Requesting: Xanax Contract: 09/10/2017 UDS: 03/29/2018, low risk, next screening 03/29/2018 Last OV: 10/19/2018 Next OV: 03/31/2019 Last Refill: 07/20/2018, #90--2 RF Database:   Please advise

## 2018-11-03 NOTE — Telephone Encounter (Signed)
[facility] sent a request for tramadol  Last written: 10/19/18 Last ov: 10/19/18 Next ov:03/21/19 Contract: due UDS: due

## 2018-12-03 ENCOUNTER — Other Ambulatory Visit: Payer: Self-pay | Admitting: *Deleted

## 2018-12-03 DIAGNOSIS — Z8669 Personal history of other diseases of the nervous system and sense organs: Secondary | ICD-10-CM

## 2018-12-03 MED ORDER — SUMATRIPTAN SUCCINATE 100 MG PO TABS: ORAL_TABLET | ORAL | 1 refills | Status: DC

## 2018-12-27 ENCOUNTER — Ambulatory Visit (INDEPENDENT_AMBULATORY_CARE_PROVIDER_SITE_OTHER): Payer: BC Managed Care – PPO | Admitting: Family Medicine

## 2018-12-27 ENCOUNTER — Encounter (INDEPENDENT_AMBULATORY_CARE_PROVIDER_SITE_OTHER): Payer: Self-pay

## 2018-12-27 ENCOUNTER — Ambulatory Visit: Payer: Self-pay | Admitting: *Deleted

## 2018-12-27 ENCOUNTER — Encounter: Payer: Self-pay | Admitting: Family Medicine

## 2018-12-27 ENCOUNTER — Telehealth: Payer: Self-pay | Admitting: Family Medicine

## 2018-12-27 ENCOUNTER — Telehealth: Payer: Self-pay

## 2018-12-27 ENCOUNTER — Other Ambulatory Visit: Payer: Self-pay

## 2018-12-27 ENCOUNTER — Other Ambulatory Visit: Payer: Self-pay | Admitting: Family Medicine

## 2018-12-27 VITALS — Temp 98.0°F

## 2018-12-27 DIAGNOSIS — J40 Bronchitis, not specified as acute or chronic: Secondary | ICD-10-CM | POA: Diagnosis not present

## 2018-12-27 DIAGNOSIS — Z20822 Contact with and (suspected) exposure to covid-19: Secondary | ICD-10-CM

## 2018-12-27 DIAGNOSIS — J0140 Acute pansinusitis, unspecified: Secondary | ICD-10-CM | POA: Diagnosis not present

## 2018-12-27 DIAGNOSIS — Z20828 Contact with and (suspected) exposure to other viral communicable diseases: Secondary | ICD-10-CM

## 2018-12-27 MED ORDER — CLARITHROMYCIN 500 MG PO TABS: 500.0000 mg | ORAL_TABLET | Freq: Two times a day (BID) | ORAL | 0 refills | Status: DC

## 2018-12-27 MED ORDER — CLARITHROMYCIN ER 500 MG PO TB24: 1000.0000 mg | ORAL_TABLET | Freq: Every day | ORAL | 0 refills | Status: DC

## 2018-12-27 MED ORDER — PROMETHAZINE-DM 6.25-15 MG/5ML PO SYRP: 5.0000 mL | ORAL_SOLUTION | Freq: Four times a day (QID) | ORAL | 0 refills | Status: DC | PRN

## 2018-12-27 NOTE — Telephone Encounter (Signed)
Patient scheduled for today for virtual.

## 2018-12-27 NOTE — Telephone Encounter (Signed)
candance calling from [facility] is calling regarding clarithromycin (BIAXIN XL) 500 MG 24 hr tablet [[id number]]  Sent the ER Biaxin. She has the plain. She did not know if Dr. Carollee Herter wanted to do the plain 2x a day. Please advise Cb- [phone]

## 2018-12-27 NOTE — Telephone Encounter (Signed)
Pt called with complaints of difficulty breathing; she has asthma; for the past week she had sinus issues (green nasal secretions), and it feels like her ears are running, and pain, and sinus pressure; she has been using her rescue inhaler, and she has SOB with exertion; she also has discomfort in her back; she also has a non-productive cough; her husband has tested positive COVID 12/25/2018; recommendations given per nurse triage protocol; she verbalized understanding; the states that she was previously given information about testing site; the pt sees Dr Etter Sjogren, [facility], and can be contacted at [phone]; will route to office for final disposition.  Reason for Disposition . HIGH RISK patient (e.g., age > 53 years, diabetes, heart or lung disease, weak immune system) (Exception: Has already been evaluated by healthcare provider and has no new or worsening symptoms)  Answer Assessment - Initial Assessment Questions 1. COVID-19 DIAGNOSIS: "Who made your Coronavirus (COVID-19) diagnosis?" "Was it confirmed by a positive lab test?" If not diagnosed by a HCP, ask "Are there lots of cases (community spread) where you live?" (See public health department website, if unsure)     community spread 2. ONSET: "When did the COVID-19 symptoms start?"      12/20/2018 3. WORST SYMPTOM: "What is your worst symptom?" (e.g., cough, fever, shortness of breath, muscle aches)     Sinus issues 4. COUGH: "Do you have a cough?" If so, ask: "How bad is the cough?"       Yes, mild 5. FEVER: "Do you have a fever?" If so, ask: "What is your temperature, how was it measured, and when did it start?"     no 6. RESPIRATORY STATUS: "Describe your breathing?" (e.g., shortness of breath, wheezing, unable to speak)      SOB with exertion; using rescue inhaler 7. BETTER-SAME-WORSE: "Are you getting better, staying the same or getting worse compared to yesterday?"  If getting worse, ask, "In what way?"    same 8. HIGH RISK  DISEASE: "Do you have any chronic medical problems?" (e.g., asthma, heart or lung disease, weak immune system, etc.)     asthma 9. PREGNANCY: "Is there any chance you are pregnant?" "When was your last menstrual period?"    No hyserectomy 10. OTHER SYMPTOMS: "Do you have any other symptoms?"  (e.g., chills, fatigue, headache, loss of smell or taste, muscle pain, sore throat)      bil ear pain, sinus pressure  Protocols used: CORONAVIRUS (COVID-19) DIAGNOSED OR SUSPECTED-A-AH

## 2018-12-27 NOTE — Progress Notes (Signed)
Virtual Visit via Video Note  I connected with Deborah Watts on 12/27/18 at  2:00 PM EDT by a video enabled telemedicine application and verified that I am speaking with the correct person using two identifiers.  Location: Patient: home  Provider: office    I discussed the limitations of evaluation and management by telemedicine and the availability of in person appointments. The patient expressed understanding and agreed to proceed.  History of Present Illness: Pt is home c/o sinus pressure and green mucous --- symptoms x 2 weeks and her ears hurt and they feel full  She is feeling like it is going into her chest Her husband tested positive for covid and she got tested today  She has no fever  Observations/Objective: Vitals:   12/27/18 1346  Temp: 98 F (36.7 C)    No other vitals obtained Pt in NAD  Assessment and Plan: 1. Bronchitis abx and cough med per orders  If symptoms worsen -- go to ER   - clarithromycin (BIAXIN XL) 500 MG 24 hr tablet; Take 2 tablets (1,000 mg total) by mouth daily.  Dispense: 20 tablet; Refill: 0 - promethazine-dextromethorphan (PROMETHAZINE-DM) 6.25-15 MG/5ML syrup; Take 5 mLs by mouth 4 (four) times daily as needed.  Dispense: 118 mL; Refill: 0  2. Acute non-recurrent pansinusitis abx per orders Pt has flonase at home  - clarithromycin (BIAXIN XL) 500 MG 24 hr tablet; Take 2 tablets (1,000 mg total) by mouth daily.  Dispense: 20 tablet; Refill: 0  3. Close exposure to COVID-19 virus  Pt tested today  Follow Up Instructions:    I discussed the assessment and treatment plan with the patient. The patient was provided an opportunity to ask questions and all were answered. The patient agreed with the plan and demonstrated an understanding of the instructions.   The patient was advised to call back or seek an in-person evaluation if the symptoms worsen or if the condition fails to improve as anticipated.  I provided 15 minutes of  non-face-to-face time during this encounter.   Ann Held, DO

## 2018-12-27 NOTE — Telephone Encounter (Signed)
I changed it -- not sure it was before they closed though

## 2018-12-27 NOTE — Telephone Encounter (Signed)
Copied from [place] [id number]. Topic: General - Other >> Dec 27, 2018  3:47 PM Leward Quan A wrote: Reason for CRM: Patient called to say that the pharmacy does not have clarithromycin (BIAXIN XL) 500 MG 24 hr tablet asking if it can be changed to the regular BIAXIN before her pharmacy closes today please

## 2018-12-28 ENCOUNTER — Encounter (INDEPENDENT_AMBULATORY_CARE_PROVIDER_SITE_OTHER): Payer: Self-pay

## 2018-12-28 LAB — NOVEL CORONAVIRUS, NAA: SARS-CoV-2, NAA: NOT DETECTED

## 2018-12-28 NOTE — Telephone Encounter (Signed)
Please advise 

## 2018-12-28 NOTE — Telephone Encounter (Signed)
I did this yesterday. 

## 2018-12-28 NOTE — Addendum Note (Signed)
Addended by: Kem Boroughs D on: 12/28/2018 04:12 PM   Modules accepted: Orders

## 2018-12-29 ENCOUNTER — Encounter (INDEPENDENT_AMBULATORY_CARE_PROVIDER_SITE_OTHER): Payer: Self-pay

## 2018-12-30 ENCOUNTER — Encounter (INDEPENDENT_AMBULATORY_CARE_PROVIDER_SITE_OTHER): Payer: Self-pay

## 2018-12-31 ENCOUNTER — Encounter (INDEPENDENT_AMBULATORY_CARE_PROVIDER_SITE_OTHER): Payer: Self-pay

## 2019-01-01 ENCOUNTER — Encounter (INDEPENDENT_AMBULATORY_CARE_PROVIDER_SITE_OTHER): Payer: Self-pay

## 2019-01-02 ENCOUNTER — Encounter (INDEPENDENT_AMBULATORY_CARE_PROVIDER_SITE_OTHER): Payer: Self-pay

## 2019-01-03 ENCOUNTER — Encounter (INDEPENDENT_AMBULATORY_CARE_PROVIDER_SITE_OTHER): Payer: Self-pay

## 2019-01-03 ENCOUNTER — Encounter: Payer: Self-pay | Admitting: Family Medicine

## 2019-01-03 ENCOUNTER — Other Ambulatory Visit: Payer: Self-pay | Admitting: Family Medicine

## 2019-01-03 DIAGNOSIS — Z20822 Contact with and (suspected) exposure to covid-19: Secondary | ICD-10-CM

## 2019-01-03 DIAGNOSIS — Z20828 Contact with and (suspected) exposure to other viral communicable diseases: Secondary | ICD-10-CM

## 2019-01-03 NOTE — Telephone Encounter (Signed)
She should get tested today=== order is in for [facility]

## 2019-01-04 ENCOUNTER — Encounter (INDEPENDENT_AMBULATORY_CARE_PROVIDER_SITE_OTHER): Payer: Self-pay

## 2019-01-06 ENCOUNTER — Other Ambulatory Visit: Payer: Self-pay

## 2019-01-06 ENCOUNTER — Encounter (INDEPENDENT_AMBULATORY_CARE_PROVIDER_SITE_OTHER): Payer: Self-pay

## 2019-01-06 DIAGNOSIS — Z20822 Contact with and (suspected) exposure to covid-19: Secondary | ICD-10-CM

## 2019-01-07 ENCOUNTER — Encounter (INDEPENDENT_AMBULATORY_CARE_PROVIDER_SITE_OTHER): Payer: Self-pay

## 2019-01-08 ENCOUNTER — Encounter (INDEPENDENT_AMBULATORY_CARE_PROVIDER_SITE_OTHER): Payer: Self-pay

## 2019-01-08 LAB — NOVEL CORONAVIRUS, NAA: SARS-CoV-2, NAA: NOT DETECTED

## 2019-01-09 ENCOUNTER — Encounter (INDEPENDENT_AMBULATORY_CARE_PROVIDER_SITE_OTHER): Payer: Self-pay

## 2019-01-11 ENCOUNTER — Encounter: Payer: Self-pay | Admitting: Family Medicine

## 2019-01-11 NOTE — Telephone Encounter (Signed)
She can go back Monday ----- test done on 22nd

## 2019-01-27 ENCOUNTER — Other Ambulatory Visit: Payer: Self-pay | Admitting: Family Medicine

## 2019-01-27 MED ORDER — NYSTATIN 100000 UNIT/ML MT SUSP: 5.0000 mL | Freq: Four times a day (QID) | OROMUCOSAL | 0 refills | Status: DC

## 2019-01-31 ENCOUNTER — Encounter: Payer: Self-pay | Admitting: Family Medicine

## 2019-01-31 DIAGNOSIS — M170 Bilateral primary osteoarthritis of knee: Secondary | ICD-10-CM

## 2019-01-31 MED ORDER — DICLOFENAC SODIUM 75 MG PO TBEC: 75.0000 mg | DELAYED_RELEASE_TABLET | Freq: Two times a day (BID) | ORAL | 3 refills | Status: DC

## 2019-02-15 ENCOUNTER — Encounter: Payer: Self-pay | Admitting: Family Medicine

## 2019-02-15 DIAGNOSIS — F411 Generalized anxiety disorder: Secondary | ICD-10-CM

## 2019-02-15 MED ORDER — ALPRAZOLAM 0.5 MG PO TABS: ORAL_TABLET | ORAL | 2 refills | Status: DC

## 2019-02-15 NOTE — Telephone Encounter (Signed)
Requesting: Xanax Contract: 03/29/2018 UDS: 03/29/2018, low risk Last OV: 12/27/2018 Next OV: 03/31/2019 Last Refill: 11/03/2018, #90-2 RF Database:   Please advise

## 2019-03-16 ENCOUNTER — Ambulatory Visit (INDEPENDENT_AMBULATORY_CARE_PROVIDER_SITE_OTHER): Payer: BC Managed Care – PPO | Admitting: Family Medicine

## 2019-03-16 ENCOUNTER — Ambulatory Visit: Payer: BC Managed Care – PPO | Attending: Internal Medicine

## 2019-03-16 ENCOUNTER — Other Ambulatory Visit: Payer: Self-pay

## 2019-03-16 ENCOUNTER — Encounter: Payer: Self-pay | Admitting: Family Medicine

## 2019-03-16 VITALS — Ht 59.0 in

## 2019-03-16 DIAGNOSIS — U071 COVID-19: Secondary | ICD-10-CM

## 2019-03-16 DIAGNOSIS — J0140 Acute pansinusitis, unspecified: Secondary | ICD-10-CM

## 2019-03-16 DIAGNOSIS — J40 Bronchitis, not specified as acute or chronic: Secondary | ICD-10-CM

## 2019-03-16 DIAGNOSIS — R238 Other skin changes: Secondary | ICD-10-CM

## 2019-03-16 MED ORDER — PROMETHAZINE-DM 6.25-15 MG/5ML PO SYRP: 5.0000 mL | ORAL_SOLUTION | Freq: Four times a day (QID) | ORAL | 0 refills | Status: DC | PRN

## 2019-03-16 MED ORDER — AZITHROMYCIN 250 MG PO TABS: ORAL_TABLET | ORAL | 0 refills | Status: DC

## 2019-03-16 NOTE — Progress Notes (Signed)
Virtual Visit via Video Note  I connected with Deborah Watts on 03/16/19 at 10:20 AM EST by a video enabled telemedicine application and verified that I am speaking with the correct person using two identifiers.  Location: Patient: home alone  Provider: home    I discussed the limitations of evaluation and management by telemedicine and the availability of in person appointments. The patient expressed understanding and agreed to proceed.  History of Present Illness: Pt is home c/o L ear ache and her sinuses are congested   + sob  No fever  Pt is taking tylenol and vita C    Observations/Objective: There were no vitals filed for this visit. Pt states no fever  Pt is in NAD-- no SOB Assessment and Plan: 1. Bronchitis con't albuterol and dulera z pack  If sob -- go to ER Pt to get covid test  - promethazine-dextromethorphan (PROMETHAZINE-DM) 6.25-15 MG/5ML syrup; Take 5 mLs by mouth 4 (four) times daily as needed.  Dispense: 118 mL; Refill: 0  2. Acute non-recurrent pansinusitis Pt to sign up for covid test  - azithromycin (ZITHROMAX Z-PAK) 250 MG tablet; As directed  Dispense: 6 each; Refill: 0   Follow Up Instructions:    I discussed the assessment and treatment plan with the patient. The patient was provided an opportunity to ask questions and all were answered. The patient agreed with the plan and demonstrated an understanding of the instructions.   The patient was advised to call back or seek an in-person evaluation if the symptoms worsen or if the condition fails to improve as anticipated.  I provided 15 minutes of non-face-to-face time during this encounter.   Ann Held, DO

## 2019-03-17 LAB — NOVEL CORONAVIRUS, NAA: SARS-CoV-2, NAA: NOT DETECTED

## 2019-03-22 ENCOUNTER — Encounter: Payer: Self-pay | Admitting: Family Medicine

## 2019-03-23 ENCOUNTER — Other Ambulatory Visit: Payer: Self-pay | Admitting: Family Medicine

## 2019-03-23 DIAGNOSIS — J029 Acute pharyngitis, unspecified: Secondary | ICD-10-CM

## 2019-03-23 MED ORDER — CLARITHROMYCIN ER 500 MG PO TB24: 1000.0000 mg | ORAL_TABLET | Freq: Every day | ORAL | 0 refills | Status: DC

## 2019-03-23 NOTE — Telephone Encounter (Signed)
I sent biaxin in

## 2019-03-30 ENCOUNTER — Other Ambulatory Visit: Payer: Self-pay

## 2019-03-31 ENCOUNTER — Ambulatory Visit (INDEPENDENT_AMBULATORY_CARE_PROVIDER_SITE_OTHER): Payer: BC Managed Care – PPO | Admitting: Family Medicine

## 2019-03-31 ENCOUNTER — Other Ambulatory Visit: Payer: Self-pay

## 2019-03-31 ENCOUNTER — Encounter: Payer: Self-pay | Admitting: Family Medicine

## 2019-03-31 VITALS — BP 118/90 | HR 78 | Temp 97.5°F | Resp 18 | Ht 59.0 in | Wt 178.4 lb

## 2019-03-31 DIAGNOSIS — Z0001 Encounter for general adult medical examination with abnormal findings: Secondary | ICD-10-CM | POA: Diagnosis not present

## 2019-03-31 DIAGNOSIS — Z8669 Personal history of other diseases of the nervous system and sense organs: Secondary | ICD-10-CM | POA: Diagnosis not present

## 2019-03-31 DIAGNOSIS — F411 Generalized anxiety disorder: Secondary | ICD-10-CM | POA: Diagnosis not present

## 2019-03-31 DIAGNOSIS — J4520 Mild intermittent asthma, uncomplicated: Secondary | ICD-10-CM | POA: Diagnosis not present

## 2019-03-31 DIAGNOSIS — F418 Other specified anxiety disorders: Secondary | ICD-10-CM

## 2019-03-31 DIAGNOSIS — M170 Bilateral primary osteoarthritis of knee: Secondary | ICD-10-CM

## 2019-03-31 DIAGNOSIS — J45909 Unspecified asthma, uncomplicated: Secondary | ICD-10-CM

## 2019-03-31 DIAGNOSIS — H8110 Benign paroxysmal vertigo, unspecified ear: Secondary | ICD-10-CM

## 2019-03-31 DIAGNOSIS — R109 Unspecified abdominal pain: Secondary | ICD-10-CM

## 2019-03-31 DIAGNOSIS — Z0000 Encounter for general adult medical examination without abnormal findings: Secondary | ICD-10-CM

## 2019-03-31 LAB — CBC WITH DIFFERENTIAL/PLATELET
Basophils Absolute: 0.1 10*3/uL (ref 0.0–0.1)
Basophils Relative: 0.9 % (ref 0.0–3.0)
Eosinophils Absolute: 0.3 10*3/uL (ref 0.0–0.7)
Eosinophils Relative: 4 % (ref 0.0–5.0)
HCT: 42 % (ref 36.0–46.0)
Hemoglobin: 13.6 g/dL (ref 12.0–15.0)
Lymphocytes Relative: 30.5 % (ref 12.0–46.0)
Lymphs Abs: 2.4 10*3/uL (ref 0.7–4.0)
MCHC: 32.3 g/dL (ref 30.0–36.0)
MCV: 89.4 fl (ref 78.0–100.0)
Monocytes Absolute: 0.5 10*3/uL (ref 0.1–1.0)
Monocytes Relative: 6.1 % (ref 3.0–12.0)
Neutro Abs: 4.6 10*3/uL (ref 1.4–7.7)
Neutrophils Relative %: 58.5 % (ref 43.0–77.0)
Platelets: 312 10*3/uL (ref 150.0–400.0)
RBC: 4.7 Mil/uL (ref 3.87–5.11)
RDW: 14 % (ref 11.5–15.5)
WBC: 7.8 10*3/uL (ref 4.0–10.5)

## 2019-03-31 LAB — COMPREHENSIVE METABOLIC PANEL
ALT: 18 U/L (ref 0–35)
AST: 16 U/L (ref 0–37)
Albumin: 4.3 g/dL (ref 3.5–5.2)
Alkaline Phosphatase: 84 U/L (ref 39–117)
BUN: 16 mg/dL (ref 6–23)
CO2: 28 mEq/L (ref 19–32)
Calcium: 9.4 mg/dL (ref 8.4–10.5)
Chloride: 110 mEq/L (ref 96–112)
Creatinine, Ser: 0.76 mg/dL (ref 0.40–1.20)
GFR: 79.08 mL/min (ref >60.00)
Glucose, Bld: 98 mg/dL (ref 70–99)
Potassium: 4.5 mEq/L (ref 3.5–5.1)
Sodium: 144 mEq/L (ref 135–145)
Total Bilirubin: 0.3 mg/dL (ref 0.2–1.2)
Total Protein: 6.7 g/dL (ref 6.0–8.3)

## 2019-03-31 LAB — LIPID PANEL
Cholesterol: 193 mg/dL (ref 0–200)
HDL: 46 mg/dL (ref >39.00)
LDL Cholesterol: 115 mg/dL — ABNORMAL HIGH (ref 0–99)
NonHDL: 147.32
Total CHOL/HDL Ratio: 4
Triglycerides: 161 mg/dL — ABNORMAL HIGH (ref 0.0–149.0)
VLDL: 32.2 mg/dL (ref 0.0–40.0)

## 2019-03-31 LAB — TSH: TSH: 0.77 u[IU]/mL (ref 0.35–4.50)

## 2019-03-31 MED ORDER — ESCITALOPRAM OXALATE 10 MG PO TABS: 10.0000 mg | ORAL_TABLET | Freq: Every day | ORAL | 5 refills | Status: DC

## 2019-03-31 MED ORDER — TOPIRAMATE 50 MG PO TABS: 50.0000 mg | ORAL_TABLET | Freq: Two times a day (BID) | ORAL | 3 refills | Status: DC

## 2019-03-31 MED ORDER — ALPRAZOLAM 0.5 MG PO TABS: ORAL_TABLET | ORAL | 2 refills | Status: DC

## 2019-03-31 MED ORDER — DICLOFENAC SODIUM 75 MG PO TBEC: 75.0000 mg | DELAYED_RELEASE_TABLET | Freq: Two times a day (BID) | ORAL | 3 refills | Status: DC

## 2019-03-31 MED ORDER — TRAMADOL HCL 50 MG PO TABS: 50.0000 mg | ORAL_TABLET | Freq: Three times a day (TID) | ORAL | 0 refills | Status: DC | PRN

## 2019-03-31 MED ORDER — MONTELUKAST SODIUM 10 MG PO TABS: 10.0000 mg | ORAL_TABLET | Freq: Every day | ORAL | 3 refills | Status: DC

## 2019-03-31 MED ORDER — MECLIZINE HCL 25 MG PO CHEW: CHEWABLE_TABLET | ORAL | 0 refills | Status: DC

## 2019-03-31 MED ORDER — ALBUTEROL SULFATE HFA 108 (90 BASE) MCG/ACT IN AERS: 2.0000 | INHALATION_SPRAY | Freq: Four times a day (QID) | RESPIRATORY_TRACT | 3 refills | Status: DC | PRN

## 2019-03-31 MED ORDER — FLUTICASONE-SALMETEROL 100-50 MCG/DOSE IN AEPB: INHALATION_SPRAY | RESPIRATORY_TRACT | 3 refills | Status: DC

## 2019-03-31 NOTE — Patient Instructions (Addendum)

## 2019-03-31 NOTE — Progress Notes (Signed)
Subjective:     Deborah Watts is a 54 y.o. female and is here for a comprehensive physical exam. The patient reports increasing anxiety--- she is using the xanax more regularly.  She feels current events and covid are making her more anxious  She also needs her dulera changed due to ins changes.    Social History   Socioeconomic History  . Marital status: Married    Spouse name: Not on file  . Number of children: Not on file  . Years of education: Not on file  . Highest education level: Not on file  Occupational History  . 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 Smoking status: Never Smoker  . Smokeless tobacco: Never Used  Substance and Sexual Activity  . Alcohol use: Yes    Alcohol/week: 0.0 standard drinks    Comment: occassional  . Drug use: No  . Sexual activity: Yes    Partners: Male  Other Topics Concern  . Not on file  Social History Narrative   Exercise-- no   Social Determinants of Health   Financial Resource Strain:   . Difficulty of Paying Living Expenses: Not on file  Food Insecurity:   . Worried About Programme researcher, broadcasting/film/video in the Last Year: Not on file  . Ran Out of Food in the Last Year: Not on file  Transportation Needs:   . Lack of Transportation (Medical): Not on file  . Lack of Transportation (Non-Medical): Not on file  Physical Activity:   . Days of Exercise per Week: Not on file  . Minutes of Exercise per Session: Not on file  Stress:   . Feeling of Stress : Not on file  Social Connections:   . Frequency of Communication with Friends and Family: Not on file  . Frequency of Social Gatherings with Friends and Family: Not on file  . Attends Religious Services: Not on file  . Active Member of Clubs or Organizations: Not on file  . Attends Banker Meetings: Not on file  . Marital Status: Not on file  Intimate Partner Violence:   . Fear of Current or Ex-Partner: Not on file  . Emotionally Abused: Not on  file  . Physically Abused: Not on file  . Sexually Abused: Not on file   Health Maintenance  Topic Date Due  . COLONOSCOPY  06/28/2014  . MAMMOGRAM  03/27/2018  . TETANUS/TDAP  12/24/2023  . INFLUENZA VACCINE  Completed  . HIV Screening  Completed    The following portions of the patient's history were reviewed and updated as appropriate:  She  has a past medical history of Anxiety, Arthritis, Asthma, Headache, History of hiatal hernia, Hypertension, and MHA (microangiopathic hemolytic anemia) (HCC). She does not have any pertinent problems on file. She  has a past surgical history that includes Total abdominal hysterectomy (Right); Laparoscopic cholecystectomy (N/A, 03/1992); Appendectomy (N/A); Tubal ligation; and Wrist arthroscopy (Right, 02/12/2018). Her family history includes Asthma in an other family member; Diabetes in an other family member; Hyperlipidemia in an other family member; Throat cancer in her father. She  reports that she has never smoked. She has never used smokeless tobacco. She reports current alcohol use. She reports that she does not use drugs. She has a current medication list which includes the following prescription(s): alprazolam, boswellia serrata, clarithromycin, diclofenac, meclizine hcl, montelukast, nystatin, promethazine-dextromethorphan, sumatriptan, topiramate, tramadol, turmeric, albuterol, escitalopram, and fluticasone-salmeterol. Current Outpatient Medications on File Prior to Visit  Medication Sig Dispense Refill  . Boswellia Serrata (BOSWELLIA PO) Take 1 capsule by mouth daily.    . clarithromycin (BIAXIN XL) 500 MG 24 hr tablet Take 2 tablets (1,000 mg total) by mouth daily. 14 tablet 0  . nystatin (MYCOSTATIN) 100000 UNIT/ML suspension Take 5 mLs (500,000 Units total) by mouth 4 (four) times daily. 60 mL 0  . promethazine-dextromethorphan (PROMETHAZINE-DM) 6.25-15 MG/5ML syrup Take 5 mLs by mouth 4 (four) times daily as needed. 118 mL 0  .  SUMAtriptan (IMITREX) 100 MG tablet TAKE 1 TABLET BY MOUTH AS NEEDED FOR MIGRAINE AND REPEAT IN 2 HOURS AS NEEDED 12 tablet 1  . TURMERIC PO Take 1,000 mg by mouth daily.     No current facility-administered medications on file prior to visit.   She is allergic to fish allergy; penicillins; iohexol; ivp dye [iodinated diagnostic agents]; and morphine and related..  Review of Systems Review of Systems  Constitutional: Negative for activity change, appetite change and fatigue.  HENT: Negative for hearing loss, congestion, tinnitus and ear discharge.  dentist q24m Eyes: Negative for visual disturbance (see optho q1y -- vision corrected to 20/20 with glasses).  Respiratory: Negative for cough, chest tightness and shortness of breath.   Cardiovascular: Negative for chest pain, palpitations and leg swelling.  Gastrointestinal: Negative for abdominal pain, diarrhea, constipation and abdominal distention.  Genitourinary: Negative for urgency, frequency, decreased urine volume and difficulty urinating.  Musculoskeletal: Negative for back pain, arthralgias and gait problem.  Skin: Negative for color change, pallor and rash.  Neurological: Negative for dizziness, light-headedness, numbness and headaches.  Hematological: Negative for adenopathy. Does not bruise/bleed easily.  Psychiatric/Behavioral: Negative for suicidal ideas, confusion, sleep disturbance, self-injury, dysphoric mood, decreased concentration and agitation.       Objective:    BP 118/90 (BP Location: Left Arm, Patient Position: Sitting, Cuff Size: Large)   Pulse 78   Temp (!) 97.5 F (36.4 C) (Temporal)   Resp 18   Ht 4\' 11"  (1.499 m)   Wt 178 lb 6.4 oz (80.9 kg)   SpO2 98%   BMI 36.03 kg/m  General appearance: alert, cooperative, appears stated age and no distress Head: Normocephalic, without obvious abnormality, atraumatic Eyes: negative findings: lids and lashes normal, conjunctivae and sclerae normal and pupils equal,  round, reactive to light and accomodation Ears: normal TM's and external ear canals both ears Nose: Nares normal. Septum midline. Mucosa normal. No drainage or sinus tenderness. Throat: lips, mucosa, and tongue normal; teeth and gums normal Neck: no adenopathy, no carotid bruit, no JVD, supple, symmetrical, trachea midline and thyroid not enlarged, symmetric, no tenderness/mass/nodules Back: symmetric, no curvature. ROM normal. No CVA tenderness. Lungs: clear to auscultation bilaterally Breasts: normal appearance, no masses or tenderness Heart: regular rate and rhythm, S1, S2 normal, no murmur, click, rub or gallop Abdomen: soft, non-tender; bowel sounds normal; no masses,  no organomegaly Pelvic: not indicated; status post hysterectomy, negative ROS Extremities: extremities normal, atraumatic, no cyanosis or edema Pulses: 2+ and symmetric Skin: Skin color, texture, turgor normal. No rashes or lesions Lymph nodes: Cervical, supraclavicular, and axillary nodes normal. Neurologic: Alert and oriented X 3, normal strength and tone. Normal symmetric reflexes. Normal coordination and gait    Assessment:    Healthy female exam.      Plan:    ghm utd Check labs  See After Visit Summary for Counseling Recommendations    1. Mild intermittent asthma, unspecified whether complicated Stable-- but dulera needs to be changed due to ins  change - albuterol (VENTOLIN HFA) 108 (90 Base) MCG/ACT inhaler; Inhale 2 puffs into the lungs every 6 (six) hours as needed for wheezing or shortness of breath.  Dispense: 18 g; Refill: 3 - Fluticasone-Salmeterol (ADVAIR) 100-50 MCG/DOSE AEPB; 2 puffs bid  Dispense: 1 each; Refill: 3  2. Depression with anxiety Increase in anxiety and xanax usage Add lexapro  F/u 1 month or sooner prn  - escitalopram (LEXAPRO) 10 MG tablet; Take 1 tablet (10 mg total) by mouth daily.  Dispense: 30 tablet; Refill: 5  3. Hx of migraines Stable - topiramate (TOPAMAX) 50 MG  tablet; Take 1 tablet (50 mg total) by mouth 2 (two) times daily.  Dispense: 180 tablet; Refill: 3  4. Asthma, chronic, unspecified asthma severity, uncomplicated stable - montelukast (SINGULAIR) 10 MG tablet; Take 1 tablet (10 mg total) by mouth at bedtime.  Dispense: 90 tablet; Refill: 3  5. Primary osteoarthritis of both knees Stable Refill meds  - diclofenac (VOLTAREN) 75 MG EC tablet; Take 1 tablet (75 mg total) by mouth 2 (two) times daily.  Dispense: 60 tablet; Refill: 3  6. Generalized anxiety disorder Worsening  lexapro added  - ALPRAZolam (XANAX) 0.5 MG tablet; TAKE ONE TABLET BY MOUTH THREE TIMES DAILY FOR SLEEP OR ANXIETY  Dispense: 90 tablet; Refill: 2   7. Benign paroxysmal positional vertigo, unspecified laterality Stable==  Just uses rare meclizine - Meclizine HCl (TRAVEL SICKNESS) 25 MG CHEW; CHEW 1 TABLET EVERY 6 HOURS AS NEEDED  Dispense: 30 tablet; Refill: 0  8. Preventative health care See above  - TSH - CBC with Differential - Lipid panel - Comprehensive metabolic panel

## 2019-04-03 ENCOUNTER — Other Ambulatory Visit: Payer: Self-pay | Admitting: Family Medicine

## 2019-04-03 DIAGNOSIS — E785 Hyperlipidemia, unspecified: Secondary | ICD-10-CM

## 2019-04-06 ENCOUNTER — Telehealth: Payer: Self-pay

## 2019-04-06 NOTE — Telephone Encounter (Signed)
Cologuard ordered through [employer] online portal.  

## 2019-04-22 ENCOUNTER — Ambulatory Visit: Payer: BC Managed Care – PPO | Attending: Internal Medicine

## 2019-04-22 DIAGNOSIS — Z20822 Contact with and (suspected) exposure to covid-19: Secondary | ICD-10-CM

## 2019-04-24 LAB — NOVEL CORONAVIRUS, NAA: SARS-CoV-2, NAA: NOT DETECTED

## 2019-05-19 ENCOUNTER — Encounter: Payer: Self-pay | Admitting: Family Medicine

## 2019-05-19 MED ORDER — ESCITALOPRAM OXALATE 20 MG PO TABS: 20.0000 mg | ORAL_TABLET | Freq: Every day | ORAL | 0 refills | Status: DC

## 2019-05-19 NOTE — Addendum Note (Signed)
Addended by: Roxanne Gates on: 05/19/2019 02:00 PM   Modules accepted: Orders

## 2019-05-19 NOTE — Telephone Encounter (Signed)
Ok to increase to 20 mg #30  0 refills but was due for f/u in feb

## 2019-05-24 ENCOUNTER — Encounter: Payer: Self-pay | Admitting: Family Medicine

## 2019-06-14 ENCOUNTER — Encounter: Payer: Self-pay | Admitting: Family Medicine

## 2019-06-16 ENCOUNTER — Other Ambulatory Visit: Payer: Self-pay | Admitting: Family Medicine

## 2019-06-30 ENCOUNTER — Other Ambulatory Visit: Payer: Self-pay | Admitting: Family Medicine

## 2019-06-30 DIAGNOSIS — J4520 Mild intermittent asthma, uncomplicated: Secondary | ICD-10-CM

## 2019-07-26 ENCOUNTER — Other Ambulatory Visit: Payer: Self-pay

## 2019-07-26 ENCOUNTER — Encounter: Payer: Self-pay | Admitting: Medical

## 2019-07-26 ENCOUNTER — Telehealth (INDEPENDENT_AMBULATORY_CARE_PROVIDER_SITE_OTHER): Payer: BC Managed Care – PPO | Admitting: Medical

## 2019-07-26 ENCOUNTER — Encounter: Payer: Self-pay | Admitting: Family Medicine

## 2019-07-26 ENCOUNTER — Telehealth: Payer: BC Managed Care – PPO | Admitting: Family Medicine

## 2019-07-26 ENCOUNTER — Other Ambulatory Visit: Payer: Self-pay | Admitting: Family Medicine

## 2019-07-26 VITALS — HR 78 | Temp 98.0°F

## 2019-07-26 DIAGNOSIS — R42 Dizziness and giddiness: Secondary | ICD-10-CM

## 2019-07-26 DIAGNOSIS — R05 Cough: Secondary | ICD-10-CM

## 2019-07-26 DIAGNOSIS — J301 Allergic rhinitis due to pollen: Secondary | ICD-10-CM

## 2019-07-26 DIAGNOSIS — R059 Cough, unspecified: Secondary | ICD-10-CM

## 2019-07-26 DIAGNOSIS — J019 Acute sinusitis, unspecified: Secondary | ICD-10-CM

## 2019-07-26 MED ORDER — ESCITALOPRAM OXALATE 20 MG PO TABS: 20.0000 mg | ORAL_TABLET | Freq: Every day | ORAL | 0 refills | Status: DC

## 2019-07-26 MED ORDER — PREDNISONE 10 MG (21) PO TBPK
ORAL_TABLET | ORAL | 0 refills | Status: DC
Start: 2019-07-26 — End: 2020-04-23

## 2019-07-26 MED ORDER — DOXYCYCLINE HYCLATE 100 MG PO TABS
100.0000 mg | ORAL_TABLET | Freq: Two times a day (BID) | ORAL | 0 refills | Status: DC
Start: 2019-07-26 — End: 2020-04-23

## 2019-07-26 NOTE — Progress Notes (Signed)
   Subjective:    Patient ID: Deborah Watts, female    DOB: [birth year], 54 y.o.   MRN: [id number]  HPI  Virtual Visit via Video Note  I connected with Deborah Watts on 07/26/19 at  4:20 PM EDT by a video enabled telemedicine application and verified that I am speaking with the correct person using two identifiers.  Location: Patient: home Provider: office   I discussed the limitations of evaluation and management by telemedicine and the availability of in person appointments. The patient expressed understanding and agreed to proceed.  History of Present Illness:   Pt states one week of sinus pressure. Pt states some nasal congestion, sneezing,  runny nose and pnd.   Pt has frontal and maxillary sinus pressure. When she blows her nose gets colored mucus. Green blood tinged mucus.  Pt using her flonase, zyrtec and montelukast.  Has cough at night. No obvious wheezing but states used advair twice daily.   Sight dizzy and mentions she has hx of vertigo.    Pt had covid vaccine. 2nd one was 06-14-2019.  Observations/Objective: General-no acute distress, pleasant, oriented. Lungs- on inspection lungs appear unlabored. Neck- no tracheal deviation or jvd on inspection. Neuro- gross motor function appears intact.  Finger-to-nose intact, symmetric smile and no hand drift.  Assessment and Plan:  You do appear to have a lot of sign symptoms representing allergic rhinitis recently.  This is despite using various medications for allergic rhinitis.  So I do think adding tapered prednisone would be beneficial at this point.  Also appear to have probable sinus infection secondary to the above.  Went ahead and prescribe doxycycline antibiotic.  Rx advisement given.  You do describe some intermittent dizziness since onset of illness.  Neurologic exam via video appears good.  I do think with above treatment based should improve.  However if he has severe dizziness or any gross motor/sensory  function deficits then have to advise to be seen at the emergency department.  Follow-up in 7 to 10 days or as needed.  Esperanza Richters, PA-C Follow Up Instructions:    I discussed the assessment and treatment plan with the patient. The patient was provided an opportunity to ask questions and all were answered. The patient agreed with the plan and demonstrated an understanding of the instructions.   The patient was advised to call back or seek an in-person evaluation if the symptoms worsen or if the condition fails to improve as anticipated.  Time spent with patient today was 25  minutes which consisted of chart review, discussing diagnosis, work up treatment and documentation.   Esperanza Richters, PA-C    Review of Systems     Objective:   Physical Exam        Assessment & Plan:

## 2019-07-27 NOTE — Patient Instructions (Signed)
You do appear to have a lot of sign symptoms representing allergic rhinitis recently.  This is despite using various medications for allergic rhinitis.  So I do think adding tapered prednisone would be beneficial at this point.  Also appear to have probable sinus infection secondary to the above.  Went ahead and prescribe doxycycline antibiotic.  Rx advisement given.  You do describe some intermittent dizziness since onset of illness.  Neurologic exam via video appears good.  I do think with above treatment based should improve.  However if he has severe dizziness or any gross motor/sensory function deficits then have to advise to be seen at the emergency department.  Follow-up in 7 to 10 days or as needed.

## 2019-07-29 ENCOUNTER — Encounter: Payer: Self-pay | Admitting: Medical

## 2019-08-02 ENCOUNTER — Other Ambulatory Visit: Payer: Self-pay

## 2019-08-02 ENCOUNTER — Encounter: Payer: Self-pay | Admitting: Family Medicine

## 2019-08-02 ENCOUNTER — Ambulatory Visit (HOSPITAL_BASED_OUTPATIENT_CLINIC_OR_DEPARTMENT_OTHER)
Admission: RE | Admit: 2019-08-02 | Discharge: 2019-08-02 | Disposition: A | Discharge status: Home / Self Care | Payer: BC Managed Care – PPO | Source: Ambulatory Visit | Attending: Family Medicine | Admitting: Family Medicine

## 2019-08-02 ENCOUNTER — Other Ambulatory Visit: Payer: Self-pay | Admitting: Family Medicine

## 2019-08-02 ENCOUNTER — Ambulatory Visit: Payer: BC Managed Care – PPO | Admitting: Family Medicine

## 2019-08-02 ENCOUNTER — Ambulatory Visit: Payer: Self-pay

## 2019-08-02 VITALS — BP 152/88 | HR 66 | Ht 59.0 in | Wt 170.0 lb

## 2019-08-02 DIAGNOSIS — M25562 Pain in left knee: Secondary | ICD-10-CM

## 2019-08-02 DIAGNOSIS — M1712 Unilateral primary osteoarthritis, left knee: Secondary | ICD-10-CM

## 2019-08-02 MED ORDER — TRIAMCINOLONE ACETONIDE 40 MG/ML IJ SUSP
40.0000 mg | Freq: Once | INTRAMUSCULAR | Status: AC
  Administered 2019-08-02: 40 mg via INTRA_ARTICULAR

## 2019-08-02 NOTE — Progress Notes (Signed)
Deborah Watts - 54 y.o. female MRN [id number]  Date of birth: 02/24/1966  SUBJECTIVE:  Including CC & ROS.  Chief Complaint  Patient presents with  . Knee Pain    left x 1 week    Deborah Watts is a 54 y.o. female that is presenting with acute left knee pain.  The pain is severe and seems to be worse at the end of the day.  Is occurring in the medial compartment.  Denies any injury or inciting event.  Has tried diclofenac and tramadol with limited improvement.  Has had a history of surgery several years ago.   Review of Systems See HPI   HISTORY: Past Medical, Surgical, Social, and Family History Reviewed & Updated per EMR.   Pertinent Historical Findings include:  Past Medical History:  Diagnosis Date  . Anxiety   . Arthritis   . Asthma   . Headache    migraines  . History of hiatal hernia    repaired 30 yrs ago in [facility]  . Hypertension   . MHA (microangiopathic hemolytic anemia) (HCC)     Past Surgical History:  Procedure Laterality Date  . APPENDECTOMY N/A   . LAPAROSCOPIC CHOLECYSTECTOMY N/A 03/1992  . TOTAL ABDOMINAL HYSTERECTOMY Right   . TUBAL LIGATION    . WRIST ARTHROSCOPY Right 02/12/2018   Procedure: Right wrist arthroscopy, evaluation under anesthesia with debridement and repair as necessary and right carpal tunnel release;  Surgeon: Dominica Severin, MD;  Location: [facility];  Service: Orthopedics;  Laterality: Right;  90 mins    Family History  Problem Relation Age of Onset  . Throat cancer Father   . Asthma Other   . Diabetes Other   . Hyperlipidemia Other     Social History   Socioeconomic History  . Marital status: Married    Spouse name: Not on file  . Number of children: Not on file  . Years of education: Not on file  . Highest education level: Not on file  Occupational History  . 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 Smoking status: Never Smoker  . Smokeless tobacco: Never Used  Substance and Sexual  Activity  . Alcohol use: Yes    Alcohol/week: 0.0 standard drinks    Comment: occassional  . Drug use: No  . Sexual activity: Yes    Partners: Male  Other Topics Concern  . Not on file  Social History Narrative   Exercise-- no   Social Determinants of Health   Financial Resource Strain:   . Difficulty of Paying Living Expenses:   Food Insecurity:   . Worried About Programme researcher, broadcasting/film/video in the Last Year:   . Barista in the Last Year:   Transportation Needs:   . Freight forwarder (Medical):   Marland Kitchen Lack of Transportation (Non-Medical):   Physical Activity:   . Days of Exercise per Week:   . Minutes of Exercise per Session:   Stress:   . Feeling of Stress :   Social Connections:   . Frequency of Communication with Friends and Family:   . Frequency of Social Gatherings with Friends and Family:   . Attends Religious Services:   . Active Member of Clubs or Organizations:   . Attends Banker Meetings:   Marland Kitchen Marital Status:   Intimate Partner Violence:   . Fear of Current or Ex-Partner:   . Emotionally Abused:   Marland Kitchen Physically Abused:   .  Sexually Abused:      PHYSICAL EXAM:  VS: BP (!) 152/88   Pulse 66   Ht 4\' 11"  (1.499 m)   Wt 170 lb (77.1 kg)   BMI 34.34 kg/m  Physical Exam Gen: NAD, alert, cooperative with exam, well-appearing MSK:  Left knee: Mild effusion. Tenderness palpation of the medial joint space. Normal range of motion. No instability with valgus or varus stress testing. Pain with patellar grind. Neurovascularly intact  Limited ultrasound: Left knee:  Mild to moderate effusion suprapatellar pouch. Normal-appearing quadricep and patellar tendon. Moderate medial joint space narrowing with outpouching of the meniscus. The lateral meniscus was hyper echogenicity which could suggest chondrocalcinosis  Summary: Degenerative changes of the medial compartment as well as findings suggestive of pseudogout  Ultrasound and interpretation  by Clearance Coots, MD   Aspiration/Injection Procedure Note Deborah Watts [birth year]  Procedure: Injection Indications: Left knee pain  Procedure Details Consent: Risks of procedure as well as the alternatives and risks of each were explained to the (patient/caregiver).  Consent for procedure obtained. Time Out: Verified patient identification, verified procedure, site/side was marked, verified correct patient position, special equipment/implants available, medications/allergies/relevent history reviewed, required imaging and test results available.  Performed.  The area was cleaned with iodine and alcohol swabs.    The left knee superior lateral suprapatellar pouch was injected using 1 cc's of 40 mg Kenalog and 4 cc's of 0.25% bupivacaine with a 22 1 1/2" needle.  Ultrasound was used. Images were obtained in long views showing the injection.     A sterile dressing was applied.  Patient did tolerate procedure well.    ASSESSMENT & PLAN:   Primary osteoarthritis of left knee Findings suggestive of degenerative changes but also pseudogout. -Injection. -X-ray. -Uric acid. -Counseled on home exercise therapy and supportive care. -OA reaction brace. -Could consider gel injections or physical therapy.

## 2019-08-02 NOTE — Addendum Note (Signed)
Addended by: Kathi Simpers F on: 08/02/2019 10:14 AM   Modules accepted: Orders

## 2019-08-02 NOTE — Assessment & Plan Note (Signed)
Findings suggestive of degenerative changes but also pseudogout. -Injection. -X-ray. -Uric acid. -Counseled on home exercise therapy and supportive care. -OA reaction brace. -Could consider gel injections or physical therapy.

## 2019-08-02 NOTE — Patient Instructions (Signed)
Nice to meet you Please try ice  Please try the exercises  I will call with the results from today  Please send me a message in MyChart with any questions or updates.  Please see me back in 4 weeks.   --Dr. Yameli Delamater  

## 2019-08-03 ENCOUNTER — Telehealth: Payer: Self-pay | Admitting: Family Medicine

## 2019-08-03 LAB — URIC ACID: Uric Acid: 4.4 mg/dL (ref 3.0–7.2)

## 2019-08-03 NOTE — Telephone Encounter (Signed)
Informed of results.   Myra Rude, MD [facility] 08/03/2019, 4:53 PM

## 2019-08-16 ENCOUNTER — Encounter: Payer: Self-pay | Admitting: Family Medicine

## 2019-08-16 MED ORDER — ESCITALOPRAM OXALATE 20 MG PO TABS: 20.0000 mg | ORAL_TABLET | Freq: Every day | ORAL | 0 refills | Status: DC

## 2019-08-22 ENCOUNTER — Other Ambulatory Visit: Payer: Self-pay | Admitting: Family Medicine

## 2019-08-22 DIAGNOSIS — R109 Unspecified abdominal pain: Secondary | ICD-10-CM

## 2019-08-22 MED ORDER — TRAMADOL HCL 50 MG PO TABS: 50.0000 mg | ORAL_TABLET | Freq: Three times a day (TID) | ORAL | 0 refills | Status: DC | PRN

## 2019-08-22 NOTE — Telephone Encounter (Signed)
Tramadol refill.   Last OV: 07/26/2019 Last Fill: 03/31/2019 #30 and 0RF Pt sig: 1 tab q8h prn UDS: 03/29/2018 Low risk

## 2019-08-30 ENCOUNTER — Ambulatory Visit: Payer: BC Managed Care – PPO | Admitting: Family Medicine

## 2019-09-01 ENCOUNTER — Encounter: Payer: Self-pay | Admitting: Family Medicine

## 2019-09-01 ENCOUNTER — Other Ambulatory Visit: Payer: Self-pay

## 2019-09-01 ENCOUNTER — Ambulatory Visit: Payer: BC Managed Care – PPO | Admitting: Family Medicine

## 2019-09-01 DIAGNOSIS — M1712 Unilateral primary osteoarthritis, left knee: Secondary | ICD-10-CM | POA: Diagnosis not present

## 2019-09-01 MED ORDER — IBUPROFEN-FAMOTIDINE 800-26.6 MG PO TABS: 1.0000 | ORAL_TABLET | Freq: Three times a day (TID) | ORAL | 1 refills | Status: DC

## 2019-09-01 NOTE — Assessment & Plan Note (Signed)
Acute worsening or degenerative change.  She is doing home exercises has tried a brace. -Counseled on home exercise therapy and supportive care. -Duexis. -Can try using coband to help keep the brace stay up. -We will try gel injections

## 2019-09-01 NOTE — Progress Notes (Signed)
Deborah Watts - 54 y.o. female MRN [id number]  Date of birth: [birth year]  SUBJECTIVE:  Including CC & ROS.  Chief Complaint  Patient presents with  . Follow-up    left knee    Deborah Watts is a 54 y.o. female that is presenting with acute worsening of her left knee pain.  She received a steroid injection upon last appointment.  Her pain is still ongoing.  The brace works with it tends to slide down her leg.  She has to take ibuprofen and Tylenol on a regular basis.  Pain is over the medial aspect.  She gets swelling from time to time.  Independent review of the left knee x-ray from 5/18 shows severe medial joint space narrowing.   Review of Systems See HPI   HISTORY: Past Medical, Surgical, Social, and Family History Reviewed & Updated per EMR.   Pertinent Historical Findings include:  Past Medical History:  Diagnosis Date  . Anxiety   . Arthritis   . Asthma   . Headache    migraines  . History of hiatal hernia    repaired 30 yrs ago in [facility]  . Hypertension   . MHA (microangiopathic hemolytic anemia) (HCC)     Past Surgical History:  Procedure Laterality Date  . APPENDECTOMY N/A   . LAPAROSCOPIC CHOLECYSTECTOMY N/A 03/1992  . TOTAL ABDOMINAL HYSTERECTOMY Right   . TUBAL LIGATION    . WRIST ARTHROSCOPY Right 02/12/2018   Procedure: Right wrist arthroscopy, evaluation under anesthesia with debridement and repair as necessary and right carpal tunnel release;  Surgeon: Roseanne Kaufman, MD;  Location: [facility];  Service: Orthopedics;  Laterality: Right;  90 mins    Family History  Problem Relation Age of Onset  . Throat cancer Father   . Asthma Other   . Diabetes Other   . Hyperlipidemia Other     Social History   Socioeconomic History  . Marital status: Married    Spouse name: Not on file  . Number of children: Not on file  . Years of education: Not on file  . Highest education level: Not on file  Occupational History  . Occupation: Engineer, technical sales:  [place]  Tobacco Use  . Smoking status: Never Smoker  . Smokeless tobacco: Never Used  Vaping Use  . Vaping Use: Never used  Substance and Sexual Activity  . Alcohol use: Yes    Alcohol/week: 0.0 standard drinks    Comment: occassional  . Drug use: No  . Sexual activity: Yes    Partners: Male  Other Topics Concern  . Not on file  Social History Narrative   Exercise-- no   Social Determinants of Health   Financial Resource Strain:   . Difficulty of Paying Living Expenses:   Food Insecurity:   . Worried About Charity fundraiser in the Last Year:   . Arboriculturist in the Last Year:   Transportation Needs:   . Film/video editor (Medical):   Marland Kitchen Lack of Transportation (Non-Medical):   Physical Activity:   . Days of Exercise per Week:   . Minutes of Exercise per Session:   Stress:   . Feeling of Stress :   Social Connections:   . Frequency of Communication with Friends and Family:   . Frequency of Social Gatherings with Friends and Family:   . Attends Religious Services:   . Active Member of Clubs or Organizations:   . Attends Club or  Organization Meetings:   Marland Kitchen Marital Status:   Intimate Partner Violence:   . Fear of Current or Ex-Partner:   . Emotionally Abused:   Marland Kitchen Physically Abused:   . Sexually Abused:      PHYSICAL EXAM:  VS: BP 138/89   Pulse 71   Ht 4\' 11"  (1.499 m)   Wt 170 lb (77.1 kg)   BMI 34.34 kg/m  Physical Exam Gen: NAD, alert, cooperative with exam, well-appearing MSK:  Left knee: Mild effusion. Tenderness to palpation of the medial joint space. Instability with valgus or varus stress testing. Neurovascularly intact     ASSESSMENT & PLAN:   Primary osteoarthritis of left knee Acute worsening or degenerative change.  She is doing home exercises has tried a brace. -Counseled on home exercise therapy and supportive care. -Duexis. -Can try using coband to help keep the brace stay up. -We will try gel  injections

## 2019-09-01 NOTE — Patient Instructions (Signed)
Good to see you Please use ice as needed  Please continue the exercises Please try the duexis. Please do not take this with any other anti-inflammatories like votaren or regular ibuprofen.   Please send me a message in MyChart with any questions or updates.  We will call when the gel injection is in.   --Dr. Jordan Likes

## 2019-09-28 ENCOUNTER — Telehealth (INDEPENDENT_AMBULATORY_CARE_PROVIDER_SITE_OTHER): Payer: BC Managed Care – PPO | Admitting: Family Medicine

## 2019-09-28 ENCOUNTER — Encounter: Payer: Self-pay | Admitting: Family Medicine

## 2019-09-28 ENCOUNTER — Other Ambulatory Visit: Payer: Self-pay

## 2019-09-28 ENCOUNTER — Other Ambulatory Visit: Payer: BC Managed Care – PPO

## 2019-09-28 VITALS — BP 110/80 | HR 87 | Resp 18 | Ht 59.0 in | Wt 187.4 lb

## 2019-09-28 DIAGNOSIS — G47419 Narcolepsy without cataplexy: Secondary | ICD-10-CM | POA: Diagnosis not present

## 2019-09-28 DIAGNOSIS — F418 Other specified anxiety disorders: Secondary | ICD-10-CM

## 2019-09-28 DIAGNOSIS — J4520 Mild intermittent asthma, uncomplicated: Secondary | ICD-10-CM

## 2019-09-28 LAB — CBC WITH DIFFERENTIAL/PLATELET
Basophils Absolute: 0.1 10*3/uL (ref 0.0–0.1)
Basophils Relative: 0.9 % (ref 0.0–3.0)
Eosinophils Absolute: 0.5 10*3/uL (ref 0.0–0.7)
Eosinophils Relative: 6 % — ABNORMAL HIGH (ref 0.0–5.0)
HCT: 41.9 % (ref 36.0–46.0)
Hemoglobin: 13.9 g/dL (ref 12.0–15.0)
Lymphocytes Relative: 25.6 % (ref 12.0–46.0)
Lymphs Abs: 2.2 10*3/uL (ref 0.7–4.0)
MCHC: 33.1 g/dL (ref 30.0–36.0)
MCV: 89.1 fl (ref 78.0–100.0)
Monocytes Absolute: 0.6 10*3/uL (ref 0.1–1.0)
Monocytes Relative: 6.4 % (ref 3.0–12.0)
Neutro Abs: 5.2 10*3/uL (ref 1.4–7.7)
Neutrophils Relative %: 61.1 % (ref 43.0–77.0)
Platelets: 312 10*3/uL (ref 150.0–400.0)
RBC: 4.7 Mil/uL (ref 3.87–5.11)
RDW: 14.7 % (ref 11.5–15.5)
WBC: 8.6 10*3/uL (ref 4.0–10.5)

## 2019-09-28 MED ORDER — ALBUTEROL SULFATE HFA 108 (90 BASE) MCG/ACT IN AERS: INHALATION_SPRAY | RESPIRATORY_TRACT | 0 refills | Status: DC

## 2019-09-28 MED ORDER — ESCITALOPRAM OXALATE 20 MG PO TABS: 20.0000 mg | ORAL_TABLET | Freq: Every day | ORAL | 3 refills | Status: DC

## 2019-09-28 NOTE — Progress Notes (Signed)
Virtual Visit via Video Note  I connected with Deborah Watts on 09/28/19 at  9:40 AM EDT by a video enabled telemedicine application and verified that I am speaking with the correct person using two identifiers.  Location: Patient: home alone  Provider: home    I discussed the limitations of evaluation and management by telemedicine and the availability of in person appointments. The patient expressed understanding and agreed to proceed.  History of Present Illness: Pt is home and needs refills on meds for anxiety , asthma  She c/o falling asleep during day quickly    Observations/Objective: Vitals:   09/28/19 1110  BP: 110/80  Pulse: 87  Resp: 18  SpO2: 97%     Pt is in nad Anxiety controlled with meds  Assessment and Plan: 1. Depression with anxiety Stable con't meds  - escitalopram (LEXAPRO) 20 MG tablet; Take 1 tablet (20 mg total) by mouth daily.  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 Mild intermittent asthma, unspecified whether complicated Stable Refill inhaler  - albuterol (VENTOLIN HFA) 108 (90 Base) MCG/ACT inhaler; INHALE 2 PUFFS BY MOUTH EVERY 6 HOURS FOR WHEEZING FOR SHORTNESS OF BREATH  Dispense: 90 g; Refill: 0   Follow Up Instructions:    I discussed the assessment and treatment plan with the patient. The patient was provided an opportunity to ask questions and all were answered. The patient agreed with the plan and demonstrated an understanding of the instructions.   The patient was advised to call back or seek an in-person evaluation if the symptoms worsen or if the condition fails to improve as anticipated.  I provided 30 minutes of non-face-to-face time during this encounter.   Donato Schultz, DO

## 2019-10-10 ENCOUNTER — Other Ambulatory Visit: Payer: Self-pay | Admitting: Family Medicine

## 2019-10-10 ENCOUNTER — Encounter: Payer: Self-pay | Admitting: Family Medicine

## 2019-10-10 DIAGNOSIS — F411 Generalized anxiety disorder: Secondary | ICD-10-CM

## 2019-10-10 DIAGNOSIS — R109 Unspecified abdominal pain: Secondary | ICD-10-CM

## 2019-10-10 DIAGNOSIS — M170 Bilateral primary osteoarthritis of knee: Secondary | ICD-10-CM

## 2019-10-10 MED ORDER — ALPRAZOLAM 0.5 MG PO TABS: ORAL_TABLET | ORAL | 1 refills | Status: DC

## 2019-10-10 NOTE — Telephone Encounter (Signed)
Patient has sent a mychart message requesting refills on these medication.   [place] [facility] Verified  (Xanax) LR: 03-31-2019 Qty: 90 w/ 2  Last office visit: 09-28-2019  Upcoming appointment: 04-02-2020

## 2019-10-10 NOTE — Telephone Encounter (Signed)
Xanax refilled.  

## 2019-10-11 ENCOUNTER — Telehealth: Payer: Self-pay

## 2019-10-11 ENCOUNTER — Other Ambulatory Visit: Payer: Self-pay | Admitting: Family Medicine

## 2019-10-11 DIAGNOSIS — M170 Bilateral primary osteoarthritis of knee: Secondary | ICD-10-CM

## 2019-10-11 DIAGNOSIS — R109 Unspecified abdominal pain: Secondary | ICD-10-CM

## 2019-10-11 MED ORDER — DICLOFENAC SODIUM 75 MG PO TBEC: 75.0000 mg | DELAYED_RELEASE_TABLET | Freq: Two times a day (BID) | ORAL | 1 refills | Status: DC

## 2019-10-11 MED ORDER — TRAMADOL HCL 50 MG PO TABS: 50.0000 mg | ORAL_TABLET | Freq: Three times a day (TID) | ORAL | 0 refills | Status: DC | PRN

## 2019-10-11 NOTE — Telephone Encounter (Signed)
The request to me was for xanax

## 2019-10-11 NOTE — Telephone Encounter (Signed)
Sent patient message via mychart.

## 2019-10-11 NOTE — Telephone Encounter (Signed)
I refilled it x1 but she should not have tramadol and xanax together ]

## 2019-10-11 NOTE — Telephone Encounter (Signed)
Patient notified of advice per pcp.

## 2019-10-11 NOTE — Telephone Encounter (Signed)
Could you advise on denial?

## 2019-10-11 NOTE — Telephone Encounter (Signed)
Shes requesting her Tramadol as well. Alprazolam and Volteran was sent to pharmacy. Im not sure where the tramadol requesting got to?

## 2019-11-09 ENCOUNTER — Other Ambulatory Visit: Payer: Self-pay | Admitting: Family Medicine

## 2019-11-09 ENCOUNTER — Encounter: Payer: Self-pay | Admitting: Family Medicine

## 2019-11-09 DIAGNOSIS — H8110 Benign paroxysmal vertigo, unspecified ear: Secondary | ICD-10-CM

## 2019-11-09 DIAGNOSIS — Z8669 Personal history of other diseases of the nervous system and sense organs: Secondary | ICD-10-CM

## 2019-11-09 MED ORDER — SUMATRIPTAN SUCCINATE 100 MG PO TABS: ORAL_TABLET | ORAL | 3 refills | Status: DC

## 2019-11-09 MED ORDER — MECLIZINE HCL 25 MG PO CHEW: CHEWABLE_TABLET | ORAL | 0 refills | Status: DC

## 2019-11-24 ENCOUNTER — Telehealth: Payer: Self-pay

## 2019-11-24 NOTE — Telephone Encounter (Signed)
Nurse Assessment Nurse: Thurmond Butts, RN, Meriam Sprague Date/Time (Eastern Time): 11/24/2019 8:03:49 AM Confirm and document reason for call. If symptomatic, describe symptoms. ---Caller has been taking care of her dad for past 2 days and he is now in the hospital for covid since last night. Has been vaccinated with [employer]. No symptoms. Has the patient had close contact with a person known or suspected to have the novel coronavirus illness OR traveled / lives in area with major community spread (including international travel) in the last 14 days from the onset of symptoms? * If Asymptomatic, screen for exposure and travel within the last 14 days. ---Yes Does the patient have any new or worsening symptoms? ---Yes Will a triage be completed? ---Yes Related visit to physician within the last 2 weeks? ---No Does the PT have any chronic conditions? (i.e. diabetes, asthma, this includes High risk factors for pregnancy, etc.) ---Yes List chronic conditions. ---asthma, anxiety Is this a behavioral health or substance abuse call? ---No Guidelines Guideline Title Affirmed Question Affirmed Notes Nurse Date/Time (Eastern Time) COVID-19 - Exposure [1] CLOSE CONTACT COVID-19 EXPOSURE within last 14 days AND Thurmond Butts, RN, Meriam Sprague 11/24/2019 8:10:34 AM PLEASE NOTE: All timestamps contained within this report are represented as [place] Standard Time. CONFIDENTIALTY NOTICE: This fax transmission is intended only for the addressee. It contains information that is legally privileged, confidential or otherwise protected from use or disclosure. If you are not the intended recipient, you are strictly prohibited from reviewing, disclosing, copying using or disseminating any of this information or taking any action in reliance on or regarding this information. If you have received this fax in error, please notify us immediately by telephone so that we can arrange for its return to [place]. Phone: [phone], Toll-Free:  [phone], Fax: [phone] Page: 2 of 2 Call Id: [id number] Guidelines Guideline Title Affirmed Question Affirmed Notes Nurse Date/Time Lamount Cohen Time) [2] needs COVID-19 lab test to return to work AND [3] NO symptoms Disp. Time Lamount Cohen Time) Disposition Final User 11/24/2019 8:11:23 AM Call PCP within 24 Hours Yes Thurmond Butts, RN, Diam[facility] Disagree/Comply Comply Caller Understands Yes PreDisposition Call Doctor Care Advice Given Per Guideline CALL PCP WITHIN 24 HOURS: * You need to discuss this with your doctor (or NP/PA) within the next 24 hours. HOME ISOLATION NEEDED IF SYMPTOMS OCCUR: CALL BACK IF: * Fever or feeling feverish occurs within 14 days of COVID-19 exposure * Cough or difficulty breathing occur within 14 days of COVID-19 exposure * Loss of taste or smell occur * Other symptoms you think might be from COVID-19 occur * You have more questions CARE ADVICE given per COVID-19 - Exposure (Adult) guideline. Comments User: Bradly Chris, RN Date/Time Lamount Cohen Time): 11/24/2019 8:16:15 AM Talked to staff at office and they asked she call their covid hot line. [phone] and pt was instructed.

## 2019-11-24 NOTE — Telephone Encounter (Signed)
Spoke with patient. [facility]. I advised patient that testing may have been done to early and if she starts to have sxs she may need to retest. Pt verbalized understanding.

## 2019-11-28 ENCOUNTER — Ambulatory Visit: Payer: BC Managed Care – PPO | Admitting: Neurology

## 2019-11-28 ENCOUNTER — Encounter: Payer: Self-pay | Admitting: Neurology

## 2019-11-28 ENCOUNTER — Encounter: Payer: Self-pay | Admitting: Family Medicine

## 2019-11-28 ENCOUNTER — Other Ambulatory Visit: Payer: Self-pay

## 2019-11-28 VITALS — BP 129/80 | HR 84 | Ht 59.0 in | Wt 189.0 lb

## 2019-11-28 DIAGNOSIS — E669 Obesity, unspecified: Secondary | ICD-10-CM | POA: Diagnosis not present

## 2019-11-28 DIAGNOSIS — R252 Cramp and spasm: Secondary | ICD-10-CM | POA: Diagnosis not present

## 2019-11-28 DIAGNOSIS — R0683 Snoring: Secondary | ICD-10-CM | POA: Insufficient documentation

## 2019-11-28 DIAGNOSIS — J0130 Acute sphenoidal sinusitis, unspecified: Secondary | ICD-10-CM

## 2019-11-28 DIAGNOSIS — R400 Somnolence: Secondary | ICD-10-CM | POA: Diagnosis not present

## 2019-11-28 DIAGNOSIS — R5383 Other fatigue: Secondary | ICD-10-CM

## 2019-11-28 NOTE — Progress Notes (Addendum)
SLEEP MEDICINE CLINIC    Provider:  Larey Seat, MD  Primary Care Physician:  Ann Held, DO [address] [place] [address]     Referring Provider: Carollee Herter, [address] [place],  [place] [address]          Chief Complaint according to patient   Patient presents with:    . New Patient (Initial Visit)           HISTORY OF PRESENT ILLNESS:  Deborah Watts is a 54  year old Caucasian female is a  patient seen here upon consultation requested by  Dr. Carollee Herter on 11/28/2019  Chief concern according to patient :  I am sleepy- Narcolepsy ???   I have the pleasure of seeing Deborah Watts today, an ambidextrous handed Caucasian female with a  sleep disorder and  has a medical history of Anxiety, Osteo-Arthritis of the Knee,  Asthma, Headaches -unpecified, History of hiatal hernia, Hypertension, and MHA (microangiopathic hemolytic anemia) ([facility]). The patient advised me that she never was diagnosed with narcolepsy, but she is here to be evaluated for the possibility of this diagnosis.  Over the last year with the pandemic, she gained weight also related to her disabling knee pain.  Her BMI now is 38.2.  She does have risk factors for other conditions that may increase daytime sleepiness, such as OSA. Last October Mrs. Walls husband contracted Covid 37, was hospitalized at [facility] for a week and quarantined afterwards at home - she got much more anxious as a result.  She started taking Lexapro but is not sure about its effect.    The patient never had a sleep study . Sleep relevant medical history: chronic sinusitis,    Family medical /sleep history: No other family member on CPAP with OSA, insomnia, and no sleep walkers.    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 Pets are present, 2 cats and a dog. . Tobacco use; none .  ETOH use; rare . Caffeine intake in form of Coffee( in AM 2 cups) Soda( 2/ week ) Tea ( /) or energy drinks. Regular exercise - none. Sleep habits are as follows: The patient's dinner time is between 6-7 PM. The patient goes to bed at 8 PM and continues to sleep for 5-6 hours, wakes rarely for bathroom breaks.   The preferred sleep position is prone , with the support of 1 pillow.  Dreams are reportedly rare.  2.45   AM is the usual rise time. The patient wakes up spontaneously before the alarm.  She reports not feeling refreshed or restored in AM, with symptoms such as morning headaches, and residual fatigue. Naps are taken frequently, lasting from 45-88mnutes and are refreshing.    Review of Systems: Out of a complete 14 system review, the patient complains of only the following symptoms, and all other reviewed systems are negative.: "my day started at 2.45- dog walking, and breakfast ,  Fatigue, sleepiness , witnessed  snoring, pain  ." I don't struggle to not fall asleep"   How likely are you to doze in the following situations: 0 = not likely, 1 = slight chance, 2 = moderate chance,  3 = high chance   Sitting and Reading? Watching Television? Sitting inactive in a public place (theater or meeting)? As a passenger in a car for an hour without a break? Lying down in the afternoon when circumstances permit? Sitting and talking to someone? Sitting quietly after lunch without alcohol? In a car, while stopped for a few minutes in traffic?   Total = 7-8/ 24 points   FSS endorsed at 49/ 63 points.   Social History   Socioeconomic History  . Marital status: Married    Spouse name: Not on file  . Number of children: Not on file  . Years of education: Not on file  . Highest education level: Not on file  Occupational History  . Occupation: Engineer, technical sales: [employer]    Tobacco Use  . Smoking status: Never Smoker  . Smokeless tobacco: Never Used  Vaping Use  . Vaping Use: Never used  Substance and Sexual Activity  . Alcohol use: Yes    Alcohol/week: 0.0 standard drinks    Comment: occassional  . Drug use: No  . Sexual activity: Yes    Partners: Male  Other Topics Concern  . Not on file  Social History Narrative   Exercise-- no   Social Determinants of Health   Financial Resource Strain:   . Difficulty of Paying Living Expenses: Not on file  Food Insecurity:   . Worried About Charity fundraiser in the Last Year: Not on file  . Ran Out of Food in the Last Year: Not on file  Transportation Needs:   . Lack of Transportation (Medical): Not on file  . Lack of Transportation (Non-Medical): Not on file  Physical Activity:   . Days of Exercise per Week: Not on file  . Minutes of Exercise per Session: Not on file  Stress:   . Feeling of Stress : Not on file  Social Connections:   . Frequency of Communication with Friends and Family: Not on file  . Frequency of Social Gatherings with Friends and Family: Not on file  . Attends Religious Services: Not on file  . Active Member of Clubs or Organizations: Not on file  . Attends Archivist Meetings: Not on file  . Marital Status: Not on file    Family History  Problem Relation Age of Onset  . Throat cancer Father   . Asthma Other   . Diabetes Other   . Hyperlipidemia Other     Past Medical History:  Diagnosis Date  . Anxiety   . Arthritis   . Asthma   . Headache    migraines  . History of hiatal hernia    repaired 30 yrs ago in [facility]  . Hypertension   . MHA (microangiopathic hemolytic anemia) ([facility])     Past Surgical History:  Procedure Laterality Date  . APPENDECTOMY N/A   . LAPAROSCOPIC CHOLECYSTECTOMY N/A 03/1992  . TOTAL ABDOMINAL HYSTERECTOMY Right   . TUBAL LIGATION    . WRIST ARTHROSCOPY Right 02/12/2018   Procedure: Right wrist arthroscopy, evaluation  under anesthesia with debridement and repair as necessary and right carpal tunnel release;  Surgeon: Roseanne Kaufman, MD;  Location: [facility];  Service: Orthopedics;  Laterality: Right;  90 mins     Current Outpatient Medications on File Prior to Visit  Medication Sig Dispense Refill  . albuterol (VENTOLIN HFA) 108 (90 Base) MCG/ACT inhaler INHALE 2 PUFFS BY MOUTH EVERY 6 HOURS FOR WHEEZING FOR SHORTNESS OF  BREATH 90 g 0  . ALPRAZolam (XANAX) 0.5 MG tablet TAKE ONE TABLET BY MOUTH THREE TIMES DAILY FOR SLEEP OR ANXIETY 90 tablet 1  . Boswellia Serrata (BOSWELLIA PO) Take 1 capsule by mouth daily.    . diclofenac (VOLTAREN) 75 MG EC tablet Take 1 tablet (75 mg total) by mouth 2 (two) times daily. 60 tablet 1  . doxycycline (VIBRA-TABS) 100 MG tablet Take 1 tablet (100 mg total) by mouth 2 (two) times daily. 20 tablet 0  . escitalopram (LEXAPRO) 20 MG tablet Take 1 tablet (20 mg total) by mouth daily. 90 tablet 3  . Fluticasone-Salmeterol (ADVAIR) 100-50 MCG/DOSE AEPB INHALE 2 DOSES BY MOUTH TWICE DAILY 240 each 0  . Meclizine HCl (TRAVEL SICKNESS) 25 MG CHEW CHEW 1 TABLET EVERY 6 HOURS AS NEEDED 30 tablet 0  . montelukast (SINGULAIR) 10 MG tablet Take 1 tablet (10 mg total) by mouth at bedtime. 90 tablet 3  . nystatin (MYCOSTATIN) 100000 UNIT/ML suspension Take 5 mLs (500,000 Units total) by mouth 4 (four) times daily. 60 mL 0  . predniSONE (STERAPRED UNI-PAK 21 TAB) 10 MG (21) TBPK tablet Standard Taper over 6 days. 21 tablet 0  . promethazine-dextromethorphan (PROMETHAZINE-DM) 6.25-15 MG/5ML syrup Take 5 mLs by mouth 4 (four) times daily as needed. 118 mL 0  . SUMAtriptan (IMITREX) 100 MG tablet TAKE 1 TABLET BY MOUTH AS NEEDED FOR MIGRAINE AND REPEAT IN 2 HOURS AS NEEDED 12 tablet 3  . topiramate (TOPAMAX) 50 MG tablet Take 1 tablet (50 mg total) by mouth 2 (two) times daily. 180 tablet 3  . traMADol (ULTRAM) 50 MG tablet Take 1 tablet (50 mg total) by mouth every 8 (eight) hours as needed. 30  tablet 0  . TURMERIC PO Take 1,000 mg by mouth daily.     No current facility-administered medications on file prior to visit.    Allergies  Allergen Reactions  . Fish Allergy Hives and Swelling  . Penicillins Anaphylaxis, Hives and Other (See Comments)    Has patient had a PCN reaction causing immediate rash, facial/tongue/throat swelling, SOB or lightheadedness with hypotension: Unknown Has patient had a PCN reaction causing severe rash involving mucus membranes or skin necrosis: No Has patient had a PCN reaction that required hospitalization: Yes Has patient had a PCN reaction occurring within the last 10 years: No If all of the above answers are "NO", then may proceed with Cephalosporin use.   . Iohexol Other (See Comments)     Desc: hives, sob, pt. needs 13 hr prep   01/16/05   . Ivp Dye [Iodinated Diagnostic Agents] Hives and Swelling  . Morphine And Related Nausea And Vomiting    Physical exam:  Today's Vitals   11/28/19 1258  BP: 129/80  Pulse: 84  Weight: 189 lb (85.7 kg)  Height: _0  (1.499 m)   Body mass index is 38.17 kg/m.   Wt Readings from Last 3 Encounters:  11/28/19 189 lb (85.7 kg)  09/28/19 187 lb 6.4 oz (85 kg)  09/01/19 170 lb (77.1 kg)     Ht Readings from Last 3 Encounters:  11/28/19 _1  (1.499 m)  09/28/19 _2  (1.499 m)  09/01/19 _3  (1.499 m)      General: The patient is awake, alert and appears not in acute distress. The patient is well groomed. Head: Normocephalic, atraumatic. Neck is supple. Mallampati: 3- very small mouth opening tiny jaw. ,  neck circumference: 15. 25inches . TMJ - night guard.  Nasal  airflow patent.  Retrognathia is not  seen.  Dental status: TMJ Cardiovascular:  Regular rate and cardiac rhythm by pulse,  without distended neck veins. Respiratory: Lungs are clear to auscultation.  Skin:  Without evidence of ankle edema, or rash. Trunk: The patient's posture is erect.   Neurologic exam : The patient is  awake and alert, oriented to place and time.   Memory subjective described as intact.  Attention span & concentration ability appears normal.  Speech is fluent,  without dysarthria, dysphonia or aphasia.  Mood and affect are appropriate.   Cranial nerves: no loss of smell or taste reported  Pupils are equal and briskly reactive to light. Funduscopic exam deferred.   Extraocular movements in vertical and horizontal planes were intact and without nystagmus. No Diplopia. Visual fields by finger perimetry are intact. Hearing was intact to soft voice and finger rubbing.    Facial sensation intact to fine touch.  Facial motor strength is symmetric and tongue and uvula move midline.  Neck ROM : rotation, tilt and flexion extension were normal for age and shoulder shrug was symmetrical.    Motor exam:  Symmetric bulk, tone and ROM.   Normal tone without cog wheeling, symmetric grip strength .   Sensory:  Fine touch and vibration were  normal.  Proprioception tested in the upper extremities was normal.   Coordination: Rapid alternating movements in the fingers/hands were of normal speed.  The Finger-to-nose maneuver was intact without evidence of ataxia, dysmetria or tremor.   Gait and station: Patient could rise unassisted from a seated position, walked without assistive device. She is limping on the left. Stance is of normal width/ base and the patient turned with 3 steps.  Toe and heel walk were deferred.  Deep tendon reflexes: in the  upper and lower extremities are symmetric and intact.  Babinski response was deferred .     Last visit with Dr. Elyse Hsu to place on 28 September 2019.  Video visit from the patient's home.  Assessment was #1 depression with anxiety (Lexapro had been initiated in January)   #2 there was a diagnosis of primary narcolepsy without cataplexy established I am not sure how we can do that without a sleep test I think this is a suspicion at this time as the patient  had brief reportedly falling asleep during the day quickly but here states that as long as she is stimulated and physically active she is not struggling with sleep attacks.  She also reported mild intermittent asthma, #4 obesity #5 knee pain impaired exercise tolerance and limp.  Patient has also history of migraine headaches.       After spending a total time of 45 minutes face to face time for physical and neurologic examination, review of laboratory studies,  personal review of imaging studies, reports and results of other testing and review of referral information / records as far as provided in visit, I have established the following assessments:  1) lots of OSA risk factors. 2) less typical ROS for narcolepsy.  3) anxiety/ depression     My Plan is to proceed with:  1)Given the low endorsement of the Epworth scale at 7 points and the fatigue severity scale being much higher endorsed I think what we will do first is an HLA narcolepsy test.  Her hypersomnia or excessive daytime sleepiness is not severe according to the self-assessment and I would first look for obstructive sleep apnea as a cause of sleepiness on this patient.  I will order a sleep test, her insurance will ultimately this decide which test she is allowed to undergo so I will order a PSG as well as a home sleep test.  I would like to thank Carollee Herter, Alferd Apa, DO and [address] [place] [address] [place],  [place] [address] for allowing me to meet with and to take care of this pleasant patient.   In short, BRENDALIZ KUK is presenting with high level fatigue, chronic knee pain and early morning shift work.   I plan to follow up either personally or through our NP within 3 month.   CC: I will share my notes with PCP   Electronically signed by: Larey Seat, MD 11/28/2019 1:13 PM  [facility] Board certified by The [employer] of Sleep Medicine and  Diplomate of the [employer] of Sleep Medicine. Board certified In Neurology through the [place], Fellow of the [employer] of Neurology. Medical Director of [facility].   Addendum- the patient tested positive for COVID 19 yesterday- her sinusitis was likely related.

## 2019-11-28 NOTE — Telephone Encounter (Signed)
Needs virtual  

## 2019-11-28 NOTE — Patient Instructions (Signed)

## 2019-11-29 ENCOUNTER — Telehealth: Payer: Self-pay

## 2019-11-29 NOTE — Telephone Encounter (Signed)
LVM for pt to call me back to schedule sleep study  

## 2019-11-30 NOTE — Telephone Encounter (Signed)
Get her set up with a virtual visit and send her info to infusion clinic please-- she has bad asthma

## 2019-12-01 ENCOUNTER — Telehealth (INDEPENDENT_AMBULATORY_CARE_PROVIDER_SITE_OTHER): Payer: BC Managed Care – PPO | Admitting: Family Medicine

## 2019-12-01 ENCOUNTER — Encounter: Payer: Self-pay | Admitting: Family Medicine

## 2019-12-01 ENCOUNTER — Other Ambulatory Visit: Payer: Self-pay | Admitting: Physician Assistant

## 2019-12-01 ENCOUNTER — Encounter: Payer: Self-pay | Admitting: Neurology

## 2019-12-01 ENCOUNTER — Other Ambulatory Visit: Payer: Self-pay

## 2019-12-01 VITALS — HR 80 | Temp 97.7°F | Ht 59.0 in

## 2019-12-01 DIAGNOSIS — U071 COVID-19: Secondary | ICD-10-CM

## 2019-12-01 DIAGNOSIS — E669 Obesity, unspecified: Secondary | ICD-10-CM

## 2019-12-01 DIAGNOSIS — J40 Bronchitis, not specified as acute or chronic: Secondary | ICD-10-CM

## 2019-12-01 DIAGNOSIS — J4520 Mild intermittent asthma, uncomplicated: Secondary | ICD-10-CM

## 2019-12-01 MED ORDER — PREDNISONE 10 MG PO TABS: ORAL_TABLET | ORAL | 0 refills | Status: DC

## 2019-12-01 MED ORDER — PROMETHAZINE-DM 6.25-15 MG/5ML PO SYRP: 5.0000 mL | ORAL_SOLUTION | Freq: Four times a day (QID) | ORAL | 0 refills | Status: DC | PRN

## 2019-12-01 MED ORDER — AZITHROMYCIN 250 MG PO TABS: ORAL_TABLET | ORAL | 0 refills | Status: DC

## 2019-12-01 NOTE — Progress Notes (Signed)
I connected by phone with Deborah Watts on 12/01/2019 at 4:49 PM to discuss the potential use of a new treatment for mild to moderate COVID-19 viral infection in non-hospitalized patients.  This patient is a 54 y.o. female that meets the FDA criteria for Emergency Use Authorization of COVID monoclonal antibody casirivimab/imdevimab.  Has a (+) direct SARS-CoV-2 viral test result  Has mild or moderate COVID-19   Is NOT hospitalized due to COVID-19  Is within 10 days of symptom onset  Has at least one of the high risk factor(s) for progression to severe COVID-19 and/or hospitalization as defined in EUA.  Specific high risk criteria : BMI > 25 and Chronic Lung Disease   BMI of 38.17   I have spoken and communicated the following to the patient or parent/caregiver regarding COVID monoclonal antibody treatment:  1. FDA has authorized the emergency use for the treatment of mild to moderate COVID-19 in adults and pediatric patients with positive results of direct SARS-CoV-2 viral testing who are 1 years of age and older weighing at least 40 kg, and who are at high risk for progressing to severe COVID-19 and/or hospitalization.  2. The significant known and potential risks and benefits of COVID monoclonal antibody, and the extent to which such potential risks and benefits are unknown.  3. Information on available alternative treatments and the risks and benefits of those alternatives, including clinical trials.  4. Patients treated with COVID monoclonal antibody should continue to self-isolate and use infection control measures (e.g., wear mask, isolate, social distance, avoid sharing personal items, clean and disinfect "high touch" surfaces, and frequent handwashing) according to CDC guidelines.   5. The patient or parent/caregiver has the option to accept or refuse COVID monoclonal antibody treatment.  After reviewing this information with the patient, The patient agreed to proceed with  receiving casirivimab\imdevimab infusion and will be provided a copy of the Fact sheet prior to receiving the infusion.   [name] 12/01/2019 4:49 PM

## 2019-12-01 NOTE — Progress Notes (Signed)
Virtual Visit via Video Note  I connected with Deborah Watts on 12/01/19 at 11:20 AM EDT by a video enabled telemedicine application and verified that I am speaking with the correct person using two identifiers.  Location/ persons in visit  Patient: home alone  Provider: office    I discussed the limitations of evaluation and management by telemedicine and the availability of in person appointments. The patient expressed understanding and agreed to proceed.  History of Present Illness: Pt is home c/o congested , sinus pressure and coughing , and some wheezing  No fever covid test came back positive yesterday   Observations/Objective: Vitals:   12/01/19 1151  Pulse: 80  Temp: 97.7 F (36.5 C)  SpO2: 98%   pt is in nad   Assessment and Plan: 1. Bronchitis due to COVID-19 virus z pack  pred taper  Cough med per orders  con't with inhalers Info sent to infusion clinic for consideration due to obesity and asthma  - azithromycin (ZITHROMAX Z-PAK) 250 MG tablet; As directed  Dispense: 6 each; Refill: 0 - predniSONE (DELTASONE) 10 MG tablet; TAKE 3 TABLETS PO QD FOR 3 DAYS THEN TAKE 2 TABLETS PO QD FOR 3 DAYS THEN TAKE 1 TABLET PO QD FOR 3 DAYS THEN TAKE 1/2 TAB PO QD FOR 3 DAYS  Dispense: 20 tablet; Refill: 0 - promethazine-dextromethorphan (PROMETHAZINE-DM) 6.25-15 MG/5ML syrup; Take 5 mLs by mouth 4 (four) times daily as needed.  Dispense: 118 mL; Refill: 0  Follow Up Instructions:    I discussed the assessment and treatment plan with the patient. The patient was provided an opportunity to ask questions and all were answered. The patient agreed with the plan and demonstrated an understanding of the instructions.   The patient was advised to call back or seek an in-person evaluation if the symptoms worsen or if the condition fails to improve as anticipated.  I provided 25 minutes of non-face-to-face time during this encounter.   Donato Schultz, DO

## 2019-12-02 ENCOUNTER — Other Ambulatory Visit (HOSPITAL_COMMUNITY): Payer: Self-pay

## 2019-12-02 ENCOUNTER — Ambulatory Visit (HOSPITAL_COMMUNITY)
Admission: RE | Admit: 2019-12-02 | Discharge: 2019-12-02 | Disposition: A | Discharge status: Home / Self Care | Payer: BC Managed Care – PPO | Source: Ambulatory Visit | Attending: Pulmonary Disease | Admitting: Pulmonary Disease

## 2019-12-02 DIAGNOSIS — U071 COVID-19: Secondary | ICD-10-CM | POA: Insufficient documentation

## 2019-12-02 MED ORDER — DIPHENHYDRAMINE HCL 50 MG/ML IJ SOLN: 50.0000 mg | Freq: Once | INTRAMUSCULAR | Status: DC | PRN

## 2019-12-02 MED ORDER — FAMOTIDINE IN NACL 20-0.9 MG/50ML-% IV SOLN: 20.0000 mg | Freq: Once | INTRAVENOUS | Status: DC | PRN

## 2019-12-02 MED ORDER — SODIUM CHLORIDE 0.9 % IV SOLN
1200.0000 mg | Freq: Once | INTRAVENOUS | Status: AC
  Administered 2019-12-02: 1200 mg via INTRAVENOUS

## 2019-12-02 MED ORDER — SODIUM CHLORIDE 0.9 % IV SOLN: INTRAVENOUS | Status: DC | PRN

## 2019-12-02 MED ORDER — EPINEPHRINE 0.3 MG/0.3ML IJ SOAJ: 0.3000 mg | Freq: Once | INTRAMUSCULAR | Status: DC | PRN

## 2019-12-02 MED ORDER — METHYLPREDNISOLONE SODIUM SUCC 125 MG IJ SOLR: 125.0000 mg | Freq: Once | INTRAMUSCULAR | Status: DC | PRN

## 2019-12-02 MED ORDER — ALBUTEROL SULFATE HFA 108 (90 BASE) MCG/ACT IN AERS: 2.0000 | INHALATION_SPRAY | Freq: Once | RESPIRATORY_TRACT | Status: DC | PRN

## 2019-12-02 NOTE — Progress Notes (Signed)
  Diagnosis: COVID-19  Physician: Dr Wright  Procedure: Covid Infusion Clinic Med: casirivimab\imdevimab infusion - Provided patient with casirivimab\imdevimab fact sheet for patients, parents and caregivers prior to infusion.  Complications: No immediate complications noted.  Discharge: Discharged home   Aviva Wolfer Albarece 12/02/2019   

## 2019-12-02 NOTE — Discharge Instructions (Signed)

## 2019-12-06 ENCOUNTER — Telehealth: Payer: Self-pay

## 2019-12-06 NOTE — Telephone Encounter (Signed)
LVM for pt to call me back to schedule sleep study  

## 2019-12-07 ENCOUNTER — Encounter: Payer: Self-pay | Admitting: Family Medicine

## 2019-12-08 ENCOUNTER — Encounter: Payer: Self-pay | Admitting: Neurology

## 2019-12-08 LAB — NARCOLEPSY EVALUATION
DQA1*01:02: NEGATIVE
DQB1*06:02: NEGATIVE

## 2019-12-08 NOTE — Telephone Encounter (Signed)
Would you patient to have a visit tomorrow?

## 2019-12-08 NOTE — Progress Notes (Signed)
Component 10 d ago DQA1*01:02 Negative  DQB1*06:02 Negative

## 2019-12-08 NOTE — Telephone Encounter (Signed)
That is the end of her 10 day quarantine so that is ok

## 2019-12-08 NOTE — Telephone Encounter (Signed)
Virtual is ok.  

## 2019-12-08 NOTE — Telephone Encounter (Signed)
This form usually needs a in person visit to be filled out. Pt tested positive for COVID on 11/30/19 and returning to work on Saturday? Please advise

## 2019-12-12 NOTE — Progress Notes (Signed)
Narcolepsy HLA negative

## 2019-12-14 ENCOUNTER — Encounter: Payer: Self-pay | Admitting: Family Medicine

## 2019-12-14 DIAGNOSIS — M170 Bilateral primary osteoarthritis of knee: Secondary | ICD-10-CM

## 2019-12-14 MED ORDER — DICLOFENAC SODIUM 75 MG PO TBEC: 75.0000 mg | DELAYED_RELEASE_TABLET | Freq: Two times a day (BID) | ORAL | 3 refills | Status: DC

## 2019-12-15 ENCOUNTER — Telehealth: Payer: Self-pay

## 2019-12-15 NOTE — Telephone Encounter (Signed)
LVM for pt to call me back to schedule sleep study   We have attempted to call the patient three times to schedule sleep study.  Patient has been unavailable at the phone numbers we have on file and has not returned our calls.  If patient calls back we will schedule them for their sleep study.  

## 2020-01-06 ENCOUNTER — Encounter: Payer: Self-pay | Admitting: Family Medicine

## 2020-01-06 ENCOUNTER — Other Ambulatory Visit: Payer: Self-pay | Admitting: Family Medicine

## 2020-01-06 DIAGNOSIS — F411 Generalized anxiety disorder: Secondary | ICD-10-CM

## 2020-01-06 MED ORDER — ALPRAZOLAM 0.5 MG PO TABS: ORAL_TABLET | ORAL | 1 refills | Status: DC

## 2020-01-06 NOTE — Telephone Encounter (Signed)
Requesting: Xanax Contract: 09/10/2017 UDS: 03/29/18, low risk Last OV: 12/01/2019 Next OV: 04/02/2020 Last Refill:  10/10/19, #90--1 RF Database:   Please advise

## 2020-01-06 NOTE — Telephone Encounter (Signed)
Requesting: alprazolam 0.5mg  Contract: 09/10/2017 UDS: 03/29/2018 Low risk Last Visit: 12/01/2019 Next Visit: 04/02/2020 Last Refill: 10/10/2019 #90 and 1RF Pt sig: 1 tab tid prn  Please Advise

## 2020-01-17 ENCOUNTER — Other Ambulatory Visit: Payer: Self-pay | Admitting: Family Medicine

## 2020-01-17 DIAGNOSIS — J4520 Mild intermittent asthma, uncomplicated: Secondary | ICD-10-CM

## 2020-02-20 ENCOUNTER — Telehealth: Payer: Self-pay

## 2020-02-20 NOTE — Telephone Encounter (Signed)
PA denied. No formulary alternatives given.

## 2020-02-20 NOTE — Telephone Encounter (Signed)
PA initiated via Covermymeds; KEY: [id number]. Awaiting determination.

## 2020-02-20 NOTE — Telephone Encounter (Signed)
Let pt know her ins will not cover and they did not give [place] alternative---- she will need to go online and see what asthma meds they cover or call them

## 2020-02-21 ENCOUNTER — Encounter: Payer: Self-pay | Admitting: Family Medicine

## 2020-02-21 ENCOUNTER — Other Ambulatory Visit: Payer: Self-pay | Admitting: Family Medicine

## 2020-02-21 DIAGNOSIS — R109 Unspecified abdominal pain: Secondary | ICD-10-CM

## 2020-02-21 DIAGNOSIS — H8110 Benign paroxysmal vertigo, unspecified ear: Secondary | ICD-10-CM

## 2020-02-21 MED ORDER — MECLIZINE HCL 25 MG PO CHEW: CHEWABLE_TABLET | ORAL | 0 refills | Status: DC

## 2020-02-21 MED ORDER — TRAMADOL HCL 50 MG PO TABS: 50.0000 mg | ORAL_TABLET | Freq: Three times a day (TID) | ORAL | 0 refills | Status: DC | PRN

## 2020-02-21 NOTE — Telephone Encounter (Signed)
Pt called. Left detailed VM 

## 2020-02-21 NOTE — Telephone Encounter (Signed)
Requesting:  Tramadol 50mg  Contract: 09/10/2017 UDS: 03/29/2018 Last Visit: 12/01/2019 Next Visit: 04/02/2020 Last Refill: 10/11/2019 #30 and 0RF Pt sig: 1 tab q8h prn   Please Advise

## 2020-02-27 ENCOUNTER — Encounter: Payer: Self-pay | Admitting: Family Medicine

## 2020-04-02 ENCOUNTER — Other Ambulatory Visit: Payer: Self-pay | Admitting: Family Medicine

## 2020-04-02 ENCOUNTER — Encounter: Payer: BC Managed Care – PPO | Admitting: Family Medicine

## 2020-04-02 DIAGNOSIS — M170 Bilateral primary osteoarthritis of knee: Secondary | ICD-10-CM

## 2020-04-02 DIAGNOSIS — F411 Generalized anxiety disorder: Secondary | ICD-10-CM

## 2020-04-02 NOTE — Telephone Encounter (Signed)
Requesting: alprazolam 0.5mg  Contract: 09/10/2017 UDS: 03/19/2018 Last Visit: 12/01/19 Next visit: 04/23/20 Last Refill: 01/06/2020 #90 and 1RF Pt sig: 1 tab tid prn  Please Advise

## 2020-04-09 ENCOUNTER — Other Ambulatory Visit: Payer: Self-pay | Admitting: Family Medicine

## 2020-04-09 DIAGNOSIS — Z8669 Personal history of other diseases of the nervous system and sense organs: Secondary | ICD-10-CM

## 2020-04-09 DIAGNOSIS — J45909 Unspecified asthma, uncomplicated: Secondary | ICD-10-CM

## 2020-04-10 ENCOUNTER — Other Ambulatory Visit: Payer: Self-pay | Admitting: Family Medicine

## 2020-04-10 DIAGNOSIS — R109 Unspecified abdominal pain: Secondary | ICD-10-CM

## 2020-04-10 MED ORDER — TRAMADOL HCL 50 MG PO TABS: 50.0000 mg | ORAL_TABLET | Freq: Three times a day (TID) | ORAL | 0 refills | Status: DC | PRN

## 2020-04-10 NOTE — Telephone Encounter (Signed)
Requesting: Tramadol  Contract: 09-10-17  UDS: 03/29/2018  Last Visit: 03/31/2019 Next Visit: 04/23/2020 Last Refill: 02/21/2020  Please Advise

## 2020-04-23 ENCOUNTER — Encounter (HOSPITAL_BASED_OUTPATIENT_CLINIC_OR_DEPARTMENT_OTHER): Payer: Self-pay

## 2020-04-23 ENCOUNTER — Ambulatory Visit (HOSPITAL_BASED_OUTPATIENT_CLINIC_OR_DEPARTMENT_OTHER)
Admission: RE | Admit: 2020-04-23 | Discharge: 2020-04-23 | Disposition: A | Discharge status: Home / Self Care | Payer: BC Managed Care – PPO | Source: Ambulatory Visit | Attending: Family Medicine | Admitting: Family Medicine

## 2020-04-23 ENCOUNTER — Encounter: Payer: Self-pay | Admitting: Family Medicine

## 2020-04-23 ENCOUNTER — Ambulatory Visit (INDEPENDENT_AMBULATORY_CARE_PROVIDER_SITE_OTHER): Payer: BC Managed Care – PPO | Admitting: Family Medicine

## 2020-04-23 ENCOUNTER — Other Ambulatory Visit: Payer: Self-pay

## 2020-04-23 ENCOUNTER — Other Ambulatory Visit (HOSPITAL_BASED_OUTPATIENT_CLINIC_OR_DEPARTMENT_OTHER): Payer: Self-pay | Admitting: Family Medicine

## 2020-04-23 VITALS — BP 108/60 | HR 62 | Temp 98.3°F | Resp 18 | Ht 59.0 in | Wt 192.0 lb

## 2020-04-23 DIAGNOSIS — Z1159 Encounter for screening for other viral diseases: Secondary | ICD-10-CM

## 2020-04-23 DIAGNOSIS — M170 Bilateral primary osteoarthritis of knee: Secondary | ICD-10-CM | POA: Diagnosis not present

## 2020-04-23 DIAGNOSIS — F418 Other specified anxiety disorders: Secondary | ICD-10-CM | POA: Diagnosis not present

## 2020-04-23 DIAGNOSIS — Z23 Encounter for immunization: Secondary | ICD-10-CM | POA: Diagnosis not present

## 2020-04-23 DIAGNOSIS — Z0000 Encounter for general adult medical examination without abnormal findings: Secondary | ICD-10-CM

## 2020-04-23 DIAGNOSIS — Z1231 Encounter for screening mammogram for malignant neoplasm of breast: Secondary | ICD-10-CM | POA: Diagnosis present

## 2020-04-23 MED ORDER — ESCITALOPRAM OXALATE 20 MG PO TABS: 20.0000 mg | ORAL_TABLET | Freq: Every day | ORAL | 3 refills | Status: DC

## 2020-04-23 MED ORDER — CELECOXIB 200 MG PO CAPS: 200.0000 mg | ORAL_CAPSULE | Freq: Two times a day (BID) | ORAL | 5 refills | Status: DC

## 2020-04-23 NOTE — Progress Notes (Signed)
Subjective:     Deborah Watts is a 55 y.o. female and is here for a comprehensive physical exam. The patient reports problems - knee pain --- seeing ortho -- waiting for surgery.  Social History   Socioeconomic History  . Marital status: Married    Spouse name: Not on file  . Number of children: Not on file  . Years of education: Not on file  . Highest education level: Not on file  Occupational History  . 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 Smoking status: Never Smoker  . Smokeless tobacco: Never Used  Vaping Use  . Vaping Use: Never used  Substance and Sexual Activity  . Alcohol use: Yes    Alcohol/week: 0.0 standard drinks    Comment: occassional  . Drug use: No  . Sexual activity: Yes    Partners: Male  Other Topics Concern  . Not on file  Social History Narrative   Exercise-- no   Social Determinants of Health   Financial Resource Strain: Not on file  Food Insecurity: Not on file  Transportation Needs: Not on file  Physical Activity: Not on file  Stress: Not on file  Social Connections: Not on file  Intimate Partner Violence: Not on file   Health Maintenance  Topic Date Due  . Hepatitis C Screening  Never done  . COLONOSCOPY (Pts 45-42yrs Insurance coverage will need to be confirmed)  Never done  . MAMMOGRAM  03/27/2018  . TETANUS/TDAP  12/24/2023  . INFLUENZA VACCINE  Completed  . COVID-19 Vaccine  Completed  . HIV Screening  Completed    The following portions of the patient's history were reviewed and updated as appropriate:  She  has a past medical history of Anxiety, Arthritis, Asthma, Headache, History of hiatal hernia, Hypertension, and MHA (microangiopathic hemolytic anemia) (HCC). She does not have any pertinent problems on file. She  has a past surgical history that includes Total abdominal hysterectomy (Right); Laparoscopic cholecystectomy (N/A, 03/1992); Appendectomy (N/A); Tubal ligation; and Wrist  arthroscopy (Right, 02/12/2018). Her family history includes Asthma in an other family member; Diabetes in an other family member; Hyperlipidemia in an other family member; Throat cancer in her father. She  reports that she has never smoked. She has never used smokeless tobacco. She reports current alcohol use. She reports that she does not use drugs. She has a current medication list which includes the following prescription(s): albuterol, alprazolam, boswellia serrata, diclofenac, escitalopram, fluticasone-salmeterol, meclizine hcl, montelukast, nystatin, promethazine-dextromethorphan, sumatriptan, topiramate, tramadol, and turmeric. Current Outpatient Medications on File Prior to Visit  Medication Sig Dispense Refill  . albuterol (VENTOLIN HFA) 108 (90 Base) MCG/ACT inhaler INHALE 2 PUFFS BY MOUTH EVERY 6 HOURS FOR WHEEZING FOR SHORTNESS OF BREATH 90 g 0  . ALPRAZolam (XANAX) 0.5 MG tablet TAKE ONE TABLET BY MOUTH 3 TIMES DAILY FOR SLEEP OR ANXIETY 90 tablet 0  . Boswellia Serrata (BOSWELLIA PO) Take 1 capsule by mouth daily.    . diclofenac (VOLTAREN) 75 MG EC tablet Take 1 tablet (75 mg total) by mouth 2 (two) times daily. 60 tablet 3  . escitalopram (LEXAPRO) 20 MG tablet Take 1 tablet (20 mg total) by mouth daily. 90 tablet 3  . Fluticasone-Salmeterol (ADVAIR) 100-50 MCG/DOSE AEPB Inhale 2 puffs into the lungs in the morning and at bedtime. 120 each 5  . Meclizine HCl (TRAVEL SICKNESS) 25 MG CHEW CHEW 1 TABLET EVERY 6 HOURS AS NEEDED 30 tablet 0  .  montelukast (SINGULAIR) 10 MG tablet Take 1 tablet (10 mg total) by mouth at bedtime. 90 tablet 3  . nystatin (MYCOSTATIN) 100000 UNIT/ML suspension Take 5 mLs (500,000 Units total) by mouth 4 (four) times daily. 60 mL 0  . promethazine-dextromethorphan (PROMETHAZINE-DM) 6.25-15 MG/5ML syrup Take 5 mLs by mouth 4 (four) times daily as needed. 118 mL 0  . SUMAtriptan (IMITREX) 100 MG tablet TAKE 1 TABLET BY MOUTH AS NEEDED FOR MIGRAINE AND REPEAT IN  2 HOURS AS NEEDED 12 tablet 3  . topiramate (TOPAMAX) 50 MG tablet Take 1 tablet (50 mg total) by mouth 2 (two) times daily. 180 tablet 3  . traMADol (ULTRAM) 50 MG tablet Take 1 tablet (50 mg total) by mouth every 8 (eight) hours as needed. 30 tablet 0  . TURMERIC PO Take 1,000 mg by mouth daily.     No current facility-administered medications on file prior to visit.   She is allergic to fish allergy, penicillins, iohexol, ivp dye [iodinated diagnostic agents], and morphine and related..  Review of Systems Review of Systems  Constitutional: Negative for activity change, appetite change and fatigue.  HENT: Negative for hearing loss, congestion, tinnitus and ear discharge.  dentist q15m Eyes: Negative for visual disturbance (see optho q1y -- vision corrected to 20/20 with glasses).  Respiratory: Negative for cough, chest tightness and shortness of breath.   Cardiovascular: Negative for chest pain, palpitations and leg swelling.  Gastrointestinal: Negative for abdominal pain, diarrhea, constipation and abdominal distention.  Genitourinary: Negative for urgency, frequency, decreased urine volume and difficulty urinating.  Musculoskeletal: Negative for back pain, arthralgias and gait problem.  Skin: Negative for color change, pallor and rash.  Neurological: Negative for dizziness, light-headedness, numbness and headaches.  Hematological: Negative for adenopathy. Does not bruise/bleed easily.  Psychiatric/Behavioral: Negative for suicidal ideas, confusion, sleep disturbance, self-injury, dysphoric mood, decreased concentration and agitation.       Objective:    BP 108/60 (BP Location: Right Arm, Patient Position: Sitting, Cuff Size: Normal)   Pulse 62   Temp 98.3 F (36.8 C) (Oral)   Resp 18   Ht 4\' 11"  (1.499 m)   Wt 192 lb (87.1 kg)   SpO2 97%   BMI 38.78 kg/m  General appearance: alert, cooperative, appears stated age and no distress Head: Normocephalic, without obvious  abnormality, atraumatic Eyes: negative findings: lids and lashes normal, conjunctivae and sclerae normal and pupils equal, round, reactive to light and accomodation Ears: normal TM's and external ear canals both ears Neck: no adenopathy, no carotid bruit, no JVD, supple, symmetrical, trachea midline and thyroid not enlarged, symmetric, no tenderness/mass/nodules Back: symmetric, no curvature. ROM normal. No CVA tenderness. Lungs: clear to auscultation bilaterally Breasts: normal appearance, no masses or tenderness Heart: regular rate and rhythm, S1, S2 normal, no murmur, click, rub or gallop Abdomen: soft, non-tender; bowel sounds normal; no masses,  no organomegaly Pelvic: not indicated; status post hysterectomy, negative ROS Extremities: extremities normal, atraumatic, no cyanosis or edema=  B/l knee pain  Pulses: 2+ and symmetric Skin: Skin color, texture, turgor normal. No rashes or lesions Lymph nodes: Cervical, supraclavicular, and axillary nodes normal. Neurologic: Alert and oriented X 3, normal strength and tone. Normal symmetric reflexes. Normal coordination and gait    Assessment:    Healthy female exam.      Plan:    ghm utd Check labs  See After Visit Summary for Counseling Recommendations    1. Preventative health care As above  - Lipid panel - CBC with  Differential/Platelet - TSH - Comprehensive metabolic panel  2. Need for hepatitis C screening test  - Hepatitis C antibody  3. Primary osteoarthritis of both knees Per ortho  - celecoxib (CELEBREX) 200 MG capsule; Take 1 capsule (200 mg total) by mouth 2 (two) times daily.  Dispense: 60 capsule; Refill: 5  4. Depression with anxiety Stable co'nt lexapro  - escitalopram (LEXAPRO) 20 MG tablet; Take 1 tablet (20 mg total) by mouth daily.  Dispense: 90 tablet; Refill: 3  5. Need for shingles vaccine   - Varicella-zoster vaccine IM

## 2020-04-23 NOTE — Patient Instructions (Signed)
Preventive Care 84-56 Years Old, Female Preventive care refers to lifestyle choices and visits with your health care provider that can promote health and wellness. This includes:  A yearly physical exam. This is also called an annual wellness visit.  Regular dental and eye exams.  Immunizations.  Screening for certain conditions.  Healthy lifestyle choices, such as: ? Eating a healthy diet. ? Getting regular exercise. ? Not using drugs or products that contain nicotine and tobacco. ? Limiting alcohol use. What can I expect for my preventive care visit? Physical exam Your health care provider will check your:  Height and weight. These may be used to calculate your BMI (body mass index). BMI is a measurement that tells if you are at a healthy weight.  Heart rate and blood pressure.  Body temperature.  Skin for abnormal spots. Counseling Your health care provider may ask you questions about your:  Past medical problems.  Family's medical history.  Alcohol, tobacco, and drug use.  Emotional well-being.  Home life and relationship well-being.  Sexual activity.  Diet, exercise, and sleep habits.  Work and work Statistician.  Access to firearms.  Method of birth control.  Menstrual cycle.  Pregnancy history. What immunizations do I need? Vaccines are usually given at various ages, according to a schedule. Your health care provider will recommend vaccines for you based on your age, medical history, and lifestyle or other factors, such as travel or where you work.   What tests do I need? Blood tests  Lipid and cholesterol levels. These may be checked every 5 years, or more often if you are over 3 years old.  Hepatitis C test.  Hepatitis B test. Screening  Lung cancer screening. You may have this screening every year starting at age 73 if you have a 30-pack-year history of smoking and currently smoke or have quit within the past 15 years.  Colorectal cancer  screening. ? All adults should have this screening starting at age 52 and continuing until age 17. ? Your health care provider may recommend screening at age 49 if you are at increased risk. ? You will have tests every 1-10 years, depending on your results and the type of screening test.  Diabetes screening. ? This is done by checking your blood sugar (glucose) after you have not eaten for a while (fasting). ? You may have this done every 1-3 years.  Mammogram. ? This may be done every 1-2 years. ? Talk with your health care provider about when you should start having regular mammograms. This may depend on whether you have a family history of breast cancer.  BRCA-related cancer screening. This may be done if you have a family history of breast, ovarian, tubal, or peritoneal cancers.  Pelvic exam and Pap test. ? This may be done every 3 years starting at age 10. ? Starting at age 11, this may be done every 5 years if you have a Pap test in combination with an HPV test. Other tests  STD (sexually transmitted disease) testing, if you are at risk.  Bone density scan. This is done to screen for osteoporosis. You may have this scan if you are at high risk for osteoporosis. Talk with your health care provider about your test results, treatment options, and if necessary, the need for more tests. Follow these instructions at home: Eating and drinking  Eat a diet that includes fresh fruits and vegetables, whole grains, lean protein, and low-fat dairy products.  Take vitamin and mineral supplements  as recommended by your health care provider.  Do not drink alcohol if: ? Your health care provider tells you not to drink. ? You are pregnant, may be pregnant, or are planning to become pregnant.  If you drink alcohol: ? Limit how much you have to 0-1 drink a day. ? Be aware of how much alcohol is in your drink. In the [place] one drink equals one 12 oz bottle of beer (355 mL), one 5 oz glass of  wine (148 mL), or one 1 oz glass of hard liquor (44 mL).   Lifestyle  Take daily care of your teeth and gums. Brush your teeth every morning and night with fluoride toothpaste. Floss one time each day.  Stay active. Exercise for at least 30 minutes 5 or more days each week.  Do not use any products that contain nicotine or tobacco, such as cigarettes, e-cigarettes, and chewing tobacco. If you need help quitting, ask your health care provider.  Do not use drugs.  If you are sexually active, practice safe sex. Use a condom or other form of protection to prevent STIs (sexually transmitted infections).  If you do not wish to become pregnant, use a form of birth control. If you plan to become pregnant, see your health care provider for a prepregnancy visit.  If told by your health care provider, take low-dose aspirin daily starting at age 50.  Find healthy ways to cope with stress, such as: ? Meditation, yoga, or listening to music. ? Journaling. ? Talking to a trusted person. ? Spending time with friends and family. Safety  Always wear your seat belt while driving or riding in a vehicle.  Do not drive: ? If you have been drinking alcohol. Do not ride with someone who has been drinking. ? When you are tired or distracted. ? While texting.  Wear a helmet and other protective equipment during sports activities.  If you have firearms in your house, make sure you follow all gun safety procedures. What's next?  Visit your health care provider once a year for an annual wellness visit.  Ask your health care provider how often you should have your eyes and teeth checked.  Stay up to date on all vaccines. This information is not intended to replace advice given to you by your health care provider. Make sure you discuss any questions you have with your health care provider. Document Revised: 12/06/2019 Document Reviewed: 11/12/2017 Elsevier Patient Education  2021 [employer].  

## 2020-04-24 ENCOUNTER — Other Ambulatory Visit: Payer: Self-pay

## 2020-04-24 DIAGNOSIS — Z1211 Encounter for screening for malignant neoplasm of colon: Secondary | ICD-10-CM

## 2020-04-24 LAB — CBC WITH DIFFERENTIAL/PLATELET
Basophils Absolute: 0.1 10*3/uL (ref 0.0–0.1)
Basophils Relative: 1 % (ref 0.0–3.0)
Eosinophils Absolute: 0.3 10*3/uL (ref 0.0–0.7)
Eosinophils Relative: 3.1 % (ref 0.0–5.0)
HCT: 41.3 % (ref 36.0–46.0)
Hemoglobin: 13.2 g/dL (ref 12.0–15.0)
Lymphocytes Relative: 24.1 % (ref 12.0–46.0)
Lymphs Abs: 2.6 10*3/uL (ref 0.7–4.0)
MCHC: 31.9 g/dL (ref 30.0–36.0)
MCV: 87.8 fl (ref 78.0–100.0)
Monocytes Absolute: 0.7 10*3/uL (ref 0.1–1.0)
Monocytes Relative: 6.5 % (ref 3.0–12.0)
Neutro Abs: 7 10*3/uL (ref 1.4–7.7)
Neutrophils Relative %: 65.3 % (ref 43.0–77.0)
Platelets: 308 10*3/uL (ref 150.0–400.0)
RBC: 4.7 Mil/uL (ref 3.87–5.11)
RDW: 14.3 % (ref 11.5–15.5)
WBC: 10.8 10*3/uL — ABNORMAL HIGH (ref 4.0–10.5)

## 2020-04-24 LAB — LIPID PANEL
Cholesterol: 148 mg/dL (ref 0–200)
HDL: 41.7 mg/dL (ref >39.00)
LDL Cholesterol: 90 mg/dL (ref 0–99)
NonHDL: 105.92
Total CHOL/HDL Ratio: 4
Triglycerides: 82 mg/dL (ref 0.0–149.0)
VLDL: 16.4 mg/dL (ref 0.0–40.0)

## 2020-04-24 LAB — COMPREHENSIVE METABOLIC PANEL
ALT: 25 U/L (ref 0–35)
AST: 18 U/L (ref 0–37)
Albumin: 4.2 g/dL (ref 3.5–5.2)
Alkaline Phosphatase: 75 U/L (ref 39–117)
BUN: 16 mg/dL (ref 6–23)
CO2: 30 mEq/L (ref 19–32)
Calcium: 9.3 mg/dL (ref 8.4–10.5)
Chloride: 107 mEq/L (ref 96–112)
Creatinine, Ser: 0.82 mg/dL (ref 0.40–1.20)
GFR: 80.3 mL/min (ref >60.00)
Glucose, Bld: 81 mg/dL (ref 70–99)
Potassium: 4.6 mEq/L (ref 3.5–5.1)
Sodium: 141 mEq/L (ref 135–145)
Total Bilirubin: 0.4 mg/dL (ref 0.2–1.2)
Total Protein: 6.6 g/dL (ref 6.0–8.3)

## 2020-04-24 LAB — TSH: TSH: 1.24 u[IU]/mL (ref 0.35–4.50)

## 2020-04-24 LAB — HEPATITIS C ANTIBODY
Hepatitis C Ab: NONREACTIVE
SIGNAL TO CUT-OFF: 0.01 (ref <1.00)

## 2020-04-26 ENCOUNTER — Encounter: Payer: BC Managed Care – PPO | Admitting: Family Medicine

## 2020-05-14 ENCOUNTER — Other Ambulatory Visit: Payer: Self-pay | Admitting: Family Medicine

## 2020-05-14 DIAGNOSIS — H8110 Benign paroxysmal vertigo, unspecified ear: Secondary | ICD-10-CM

## 2020-05-14 DIAGNOSIS — F411 Generalized anxiety disorder: Secondary | ICD-10-CM

## 2020-05-14 DIAGNOSIS — R109 Unspecified abdominal pain: Secondary | ICD-10-CM

## 2020-05-14 NOTE — Telephone Encounter (Signed)
Requesting: tramadol 50mg , alprazolam 0.5mg  Contract: 09/10/2017 UDS: 03/29/2018 Last Visit: 04/23/2020 Next Visit: 04/25/2021 Last Refill alprazolam: 04/02/2020 #90 and 0RF Last Refill on tramadol: 04/10/2020 #30 and 0RF  Please Advise

## 2020-05-15 MED ORDER — MECLIZINE HCL 25 MG PO CHEW: CHEWABLE_TABLET | ORAL | 0 refills | Status: DC

## 2020-05-15 MED ORDER — ALPRAZOLAM 0.5 MG PO TABS: ORAL_TABLET | ORAL | 0 refills | Status: DC

## 2020-05-15 MED ORDER — TRAMADOL HCL 50 MG PO TABS: 50.0000 mg | ORAL_TABLET | Freq: Three times a day (TID) | ORAL | 0 refills | Status: DC | PRN

## 2020-06-05 ENCOUNTER — Other Ambulatory Visit: Payer: Self-pay | Admitting: Family Medicine

## 2020-06-05 DIAGNOSIS — F411 Generalized anxiety disorder: Secondary | ICD-10-CM

## 2020-06-06 NOTE — Telephone Encounter (Signed)
Requesting: Xanax Contract: 03/29/2018 UDS: 03/29/2018 Last OV: 04/23/2020 Next OV: 04/25/2021 Last Refill: 05/15/2020, #90--0 RF Database:   Please advise

## 2020-06-07 MED ORDER — ALPRAZOLAM 0.5 MG PO TABS: ORAL_TABLET | ORAL | 0 refills | Status: DC

## 2020-06-12 HISTORY — PX: REPLACEMENT TOTAL KNEE: SUR1224

## 2020-07-30 ENCOUNTER — Other Ambulatory Visit: Payer: Self-pay | Admitting: Family Medicine

## 2020-07-30 DIAGNOSIS — F411 Generalized anxiety disorder: Secondary | ICD-10-CM

## 2020-07-30 NOTE — Telephone Encounter (Signed)
Requesting: alprazolam 0.5mg  Contract:09/10/2017 UDS: 03/29/2018 Last Visit: 04/23/2020 Next Visit: 04/25/2021 Last Refill: 06/07/2020 #90 and 0RF Pt sig: 1 tab tid prn  Please Advise

## 2020-08-06 ENCOUNTER — Other Ambulatory Visit: Payer: Self-pay | Admitting: Family Medicine

## 2020-08-06 ENCOUNTER — Encounter: Payer: Self-pay | Admitting: Family Medicine

## 2020-08-06 DIAGNOSIS — H8110 Benign paroxysmal vertigo, unspecified ear: Secondary | ICD-10-CM

## 2020-08-06 DIAGNOSIS — F411 Generalized anxiety disorder: Secondary | ICD-10-CM

## 2020-08-06 MED ORDER — ALPRAZOLAM 0.5 MG PO TABS: ORAL_TABLET | ORAL | 1 refills | Status: DC

## 2020-08-06 MED ORDER — MECLIZINE HCL 25 MG PO CHEW: CHEWABLE_TABLET | ORAL | 0 refills | Status: DC

## 2020-08-06 NOTE — Telephone Encounter (Signed)
Requesting: Xanax  Contract:  03/29/2018 UDS: 03/29/2018 Last OV: 04/23/2020 Next OV: 04/25/2021 Last Refill: 07/30/2020, #90--1 RF Database:   Please advise

## 2020-08-29 ENCOUNTER — Other Ambulatory Visit: Payer: Self-pay | Admitting: Family Medicine

## 2020-08-29 ENCOUNTER — Encounter: Payer: Self-pay | Admitting: Family Medicine

## 2020-08-29 DIAGNOSIS — M170 Bilateral primary osteoarthritis of knee: Secondary | ICD-10-CM

## 2020-08-29 DIAGNOSIS — R109 Unspecified abdominal pain: Secondary | ICD-10-CM

## 2020-08-29 MED ORDER — DICLOFENAC SODIUM 75 MG PO TBEC: 75.0000 mg | DELAYED_RELEASE_TABLET | Freq: Two times a day (BID) | ORAL | 1 refills | Status: DC

## 2020-08-29 MED ORDER — TRAMADOL HCL 50 MG PO TABS
50.0000 mg | ORAL_TABLET | Freq: Three times a day (TID) | ORAL | 0 refills | Status: DC | PRN
Start: 2020-08-29 — End: 2020-11-28

## 2020-08-29 NOTE — Telephone Encounter (Signed)
Requesting: tramadol Contract: 09/10/17 UDS: 03/29/18 Last Visit: 04/23/20 Next Visit: 05/02/21 Last Refill: 05/15/20  Please Advise

## 2020-09-21 ENCOUNTER — Encounter (HOSPITAL_BASED_OUTPATIENT_CLINIC_OR_DEPARTMENT_OTHER): Payer: Self-pay | Admitting: Emergency Medicine

## 2020-09-21 ENCOUNTER — Other Ambulatory Visit: Payer: Self-pay

## 2020-09-21 ENCOUNTER — Emergency Department (HOSPITAL_BASED_OUTPATIENT_CLINIC_OR_DEPARTMENT_OTHER): Payer: BC Managed Care – PPO

## 2020-09-21 ENCOUNTER — Emergency Department (HOSPITAL_BASED_OUTPATIENT_CLINIC_OR_DEPARTMENT_OTHER)
Admission: EM | Admit: 2020-09-21 | Discharge: 2020-09-21 | Disposition: A | Discharge status: Home / Self Care | Payer: BC Managed Care – PPO | Attending: Emergency Medicine | Admitting: Emergency Medicine

## 2020-09-21 DIAGNOSIS — J45909 Unspecified asthma, uncomplicated: Secondary | ICD-10-CM | POA: Diagnosis not present

## 2020-09-21 DIAGNOSIS — I10 Essential (primary) hypertension: Secondary | ICD-10-CM | POA: Diagnosis not present

## 2020-09-21 DIAGNOSIS — M545 Low back pain, unspecified: Secondary | ICD-10-CM | POA: Diagnosis not present

## 2020-09-21 DIAGNOSIS — R3915 Urgency of urination: Secondary | ICD-10-CM | POA: Insufficient documentation

## 2020-09-21 DIAGNOSIS — R109 Unspecified abdominal pain: Secondary | ICD-10-CM | POA: Diagnosis not present

## 2020-09-21 DIAGNOSIS — Z79899 Other long term (current) drug therapy: Secondary | ICD-10-CM | POA: Insufficient documentation

## 2020-09-21 DIAGNOSIS — Z7951 Long term (current) use of inhaled steroids: Secondary | ICD-10-CM | POA: Insufficient documentation

## 2020-09-21 LAB — URINALYSIS, ROUTINE W REFLEX MICROSCOPIC
Bilirubin Urine: NEGATIVE
Glucose, UA: NEGATIVE mg/dL
Hgb urine dipstick: NEGATIVE
Ketones, ur: NEGATIVE mg/dL
Leukocytes,Ua: NEGATIVE
Nitrite: NEGATIVE
Protein, ur: NEGATIVE mg/dL
Specific Gravity, Urine: <1.005 — ABNORMAL LOW (ref 1.005–1.030)
pH: 6 (ref 5.0–8.0)

## 2020-09-21 MED ORDER — OXYCODONE HCL 5 MG PO TABS: 5.0000 mg | ORAL_TABLET | Freq: Four times a day (QID) | ORAL | 0 refills | Status: DC | PRN

## 2020-09-21 MED ORDER — NITROFURANTOIN MONOHYD MACRO 100 MG PO CAPS: 100.0000 mg | ORAL_CAPSULE | Freq: Two times a day (BID) | ORAL | 0 refills | Status: AC

## 2020-09-21 MED ORDER — NITROFURANTOIN MONOHYD MACRO 100 MG PO CAPS
100.0000 mg | ORAL_CAPSULE | Freq: Once | ORAL | Status: AC
  Administered 2020-09-21: 100 mg via ORAL
  Filled 2020-09-21: qty 1

## 2020-09-21 MED ORDER — TAMSULOSIN HCL 0.4 MG PO CAPS: 0.4000 mg | ORAL_CAPSULE | Freq: Every day | ORAL | 0 refills | Status: AC

## 2020-09-21 MED ORDER — ONDANSETRON 4 MG PO TBDP: 4.0000 mg | ORAL_TABLET | Freq: Three times a day (TID) | ORAL | 0 refills | Status: AC | PRN

## 2020-09-21 MED ORDER — BELLADONNA ALKALOIDS-OPIUM 16.2-30 MG RE SUPP: 1.0000 | Freq: Two times a day (BID) | RECTAL | 0 refills | Status: DC | PRN

## 2020-09-21 MED ORDER — OXYCODONE HCL 5 MG PO TABS
5.0000 mg | ORAL_TABLET | Freq: Once | ORAL | Status: AC
  Administered 2020-09-21: 5 mg via ORAL
  Filled 2020-09-21: qty 1

## 2020-09-21 MED ORDER — IBUPROFEN 800 MG PO TABS
800.0000 mg | ORAL_TABLET | Freq: Once | ORAL | Status: AC
  Administered 2020-09-21: 800 mg via ORAL
  Filled 2020-09-21: qty 1

## 2020-09-21 MED ORDER — ONDANSETRON 4 MG PO TBDP
4.0000 mg | ORAL_TABLET | Freq: Once | ORAL | Status: AC
  Administered 2020-09-21: 4 mg via ORAL
  Filled 2020-09-21: qty 1

## 2020-09-21 NOTE — ED Triage Notes (Signed)
Pt c/o lower back pain and unable to urinate onset 1am

## 2020-09-21 NOTE — Discharge Instructions (Addendum)
We started you on antibiotic for possible urine infection.  It is also possible that you are having bladder spasms.  I prescribed you some medication which can help with that.  You should return to the emergency department if you have difficulty urinating (especially if you cannot pee for 12 hours), or you are having worsening pain in your belly, or if you begin to have numbness around your vagina, or sudden numbness and weakness in your lower legs.  These are potential medical emergencies that may need immediate attention.

## 2020-09-21 NOTE — ED Notes (Signed)
ED Provider at bedside. 

## 2020-09-21 NOTE — ED Provider Notes (Signed)
[facility] EMERGENCY DEPARTMENT Provider Note   CSN: [id number] Arrival date & time: 09/21/20  0712     History Chief Complaint  Patient presents with   Urinary Retention    Deborah Watts is a 55 y.o. female here with lower back pain onset last night, acute, woke her from sleep, throbbing, right side > left, never had it before, nothing makes it better or worse.  She reports difficulty urinating since last night.  Urinated here in Ed.  No hx of kidney stones.  Distant hx of UTI's.  No fevers or chills.  No other acute complaints.  HPI     Past Medical History:  Diagnosis Date   Anxiety    Arthritis    Asthma    Headache    migraines   History of hiatal hernia    repaired 30 yrs ago in [place]   Hypertension    MHA (microangiopathic hemolytic anemia) (HCC)     Patient Active Problem List   Diagnosis Date Noted   Loud snoring 11/28/2019   Daytime sleepiness 11/28/2019   Other fatigue 11/28/2019   Primary osteoarthritis of left knee 08/02/2019   Rib pain 10/19/2018   Right knee pain 04/12/2017   Preventative health care 03/25/2016   Left ankle pain 01/14/2016   Sinusitis, acute 12/22/2014   Left leg pain 04/28/2014   Right calf pain 04/20/2014   Acute bacterial sinusitis 04/14/2014   Obesity (BMI 30-39.9) 01/06/2013   Influenza 03/10/2011   Thrush, oral 03/04/2011   TINEA CRURIS 02/05/2010   HYPOKALEMIA 04/26/2009   LOW BACK PAIN SYNDROME 05/26/2007   LEG CRAMPS 05/14/2007   EDEMA 05/14/2007   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 05/01/2007   ASTHMATIC BRONCHITIS, ACUTE 10/15/2006   ANXIETY 04/20/2006   Asthma with acute exacerbation 04/20/2006   RESTLESS LEG SYNDROME, HX OF 04/20/2006    Past Surgical History:  Procedure Laterality Date   APPENDECTOMY N/A    LAPAROSCOPIC CHOLECYSTECTOMY N/A 03/1992   TOTAL ABDOMINAL HYSTERECTOMY Right    TUBAL LIGATION     WRIST ARTHROSCOPY Right 02/12/2018   Procedure: Right wrist arthroscopy,  evaluation under anesthesia with debridement and repair as necessary and right carpal tunnel release;  Surgeon: Dominica Severin, MD;  Location: [facility] OR;  Service: Orthopedics;  Laterality: Right;  90 mins     OB History   No obstetric history on file.     Family History  Problem Relation Age of Onset   Throat cancer Father    Asthma Other    Diabetes Other    Hyperlipidemia Other     Social History   Tobacco Use   Smoking status: Never   Smokeless tobacco: Never  Vaping Use   Vaping Use: Never used  Substance Use Topics   Alcohol use: Yes    Alcohol/week: 0.0 standard drinks    Comment: occassional   Drug use: No    Home Medications Prior to Admission medications   Medication Sig Start Date End Date Taking? Authorizing Provider  nitrofurantoin, macrocrystal-monohydrate, (MACROBID) 100 MG capsule Take 1 capsule (100 mg total) by mouth 2 (two) times daily for 5 days. 09/21/20 09/26/20 Yes Favor Kreh, Kermit Balo, MD  ondansetron (ZOFRAN ODT) 4 MG disintegrating tablet Take 1 tablet (4 mg total) by mouth every 8 (eight) hours as needed for up to 15 days for nausea or vomiting. 09/21/20 10/06/20 Yes Imari Reen, Kermit Balo, MD  oxyCODONE (ROXICODONE) 5 MG immediate release tablet Take 1 tablet (5 mg  total) by mouth every 6 (six) hours as needed for up to 12 doses for severe pain. 09/21/20  Yes Lynx Goodrich, Kermit Balo, MD  tamsulosin (FLOMAX) 0.4 MG CAPS capsule Take 1 capsule (0.4 mg total) by mouth daily after breakfast for 15 days. 09/21/20 10/06/20 Yes Terald Sleeper, MD  albuterol (VENTOLIN HFA) 108 (90 Base) MCG/ACT inhaler INHALE 2 PUFFS BY MOUTH EVERY 6 HOURS FOR WHEEZING FOR SHORTNESS OF BREATH 09/28/19   Zola Button, Grayling Congress, DO  ALPRAZolam Prudy Feeler) 0.5 MG tablet TAKE 1 TABLET BY MOUTH THREE TIMES PER DAY AS NEEDED FOR SLEEP OR ANXIETY 08/06/20   Zola Button, Grayling Congress, DO  Boswellia Serrata (BOSWELLIA PO) Take 1 capsule by mouth daily.    [provider]  diclofenac (VOLTAREN) 75 MG EC tablet  Take 1 tablet (75 mg total) by mouth 2 (two) times daily. 08/29/20   Seabron Spates R, DO  escitalopram (LEXAPRO) 20 MG tablet Take 1 tablet (20 mg total) by mouth daily. 04/23/20   Donato Schultz, DO  Fluticasone-Salmeterol (ADVAIR) 100-50 MCG/DOSE AEPB Inhale 2 puffs into the lungs in the morning and at bedtime. 01/17/20   Donato Schultz, DO  Meclizine HCl (TRAVEL SICKNESS) 25 MG CHEW CHEW 1 TABLET EVERY 6 HOURS AS NEEDED 08/06/20   Zola Button, Grayling Congress, DO  montelukast (SINGULAIR) 10 MG tablet Take 1 tablet (10 mg total) by mouth at bedtime. 04/09/20   Donato Schultz, DO  nystatin (MYCOSTATIN) 100000 UNIT/ML suspension Take 5 mLs (500,000 Units total) by mouth 4 (four) times daily. 01/27/19   Donato Schultz, DO  promethazine-dextromethorphan (PROMETHAZINE-DM) 6.25-15 MG/5ML syrup Take 5 mLs by mouth 4 (four) times daily as needed. 03/16/19   Seabron Spates R, DO  SUMAtriptan (IMITREX) 100 MG tablet TAKE 1 TABLET BY MOUTH AS NEEDED FOR MIGRAINE AND REPEAT IN 2 HOURS AS NEEDED 11/09/19   Zola Button, Grayling Congress, DO  topiramate (TOPAMAX) 50 MG tablet Take 1 tablet (50 mg total) by mouth 2 (two) times daily. 04/09/20   Donato Schultz, DO  traMADol (ULTRAM) 50 MG tablet Take 1 tablet (50 mg total) by mouth every 8 (eight) hours as needed. 08/29/20   Seabron Spates R, DO  TURMERIC PO Take 1,000 mg by mouth daily.    [provider]    Allergies    Fish allergy, Penicillins, Iohexol, Ivp dye [iodinated diagnostic agents], and Morphine and related  Review of Systems   Review of Systems  Constitutional:  Negative for chills and fever.  Respiratory:  Negative for cough and shortness of breath.   Cardiovascular:  Negative for chest pain and palpitations.  Gastrointestinal:  Negative for abdominal pain, constipation, nausea and vomiting.  Genitourinary:  Positive for difficulty urinating and flank pain. Negative for hematuria.  Musculoskeletal:  Positive  for back pain. Negative for arthralgias.  Skin:  Negative for color change and rash.  Neurological:  Negative for syncope and light-headedness.  All other systems reviewed and are negative.  Physical Exam Updated Vital Signs BP (!) 126/102 (BP Location: Right Arm)   Pulse 63   Temp 97.8 F (36.6 C) (Oral)   Resp 15   Ht 4\' 11"  (1.499 m)   Wt 87.1 kg   SpO2 98%   BMI 38.78 kg/m   Physical Exam Constitutional:      General: She is not in acute distress. HENT:     Head: Normocephalic and atraumatic.  Eyes:  Conjunctiva/sclera: Conjunctivae normal.     Pupils: Pupils are equal, round, and reactive to light.  Cardiovascular:     Rate and Rhythm: Normal rate and regular rhythm.     Pulses: Normal pulses.  Pulmonary:     Effort: Pulmonary effort is normal. No respiratory distress.  Abdominal:     General: There is no distension.     Tenderness: There is no abdominal tenderness. There is no right CVA tenderness or left CVA tenderness.  Musculoskeletal:     Comments: Lower back tenderness  Skin:    General: Skin is warm and dry.  Neurological:     General: No focal deficit present.     Mental Status: She is alert and oriented to person, place, and time. Mental status is at baseline.     Sensory: No sensory deficit.     Motor: No weakness.  Psychiatric:        Mood and Affect: Mood normal.        Behavior: Behavior normal.    ED Results / Procedures / Treatments   Labs (all labs ordered are listed, but only abnormal results are displayed) Labs Reviewed  URINALYSIS, ROUTINE W REFLEX MICROSCOPIC - Abnormal; Notable for the following components:      Result Value   Color, Urine STRAW (*)    Specific Gravity, Urine <1.005 (*)    All other components within normal limits    EKG None  Radiology CT Renal Stone Study  Result Date: 09/21/2020 CLINICAL DATA:  Right flank pain with kidney stone suspected EXAM: CT ABDOMEN AND PELVIS WITHOUT CONTRAST TECHNIQUE:  Multidetector CT imaging of the abdomen and pelvis was performed following the standard protocol without IV contrast. COMPARISON:  None available FINDINGS: Lower chest:  No contributory findings. Hepatobiliary: Hepatic steatosis. No focal liver abnormality. Cholecystectomy. Pancreas: Unremarkable. Spleen: Unremarkable. Adrenals/Urinary Tract: Negative adrenals. No hydronephrosis or stone. Unremarkable bladder. Stomach/Bowel: Colonic fluid reaching the distal half of the colon, but no reported diarrhea or vomiting. No bowel obstruction or Kuhnert thickening. Appendectomy. Vascular/Lymphatic: No acute vascular abnormality. No mass or adenopathy. Reproductive:Hysterectomy. Other: No ascites or pneumoperitoneum. Musculoskeletal: No acute abnormalities. Lumbar spine degeneration with advanced involvement of the T12-L1 and L5-S1 disc spaces IMPRESSION: 1. No hydronephrosis or urolithiasis. 2. Hepatic steatosis. 3. Cholecystectomy, appendectomy, and hysterectomy. Electronically Signed   By: Marnee SpringJonathon  Watts M.D.   On: 09/21/2020 08:10    Procedures Procedures   Medications Ordered in ED Medications  oxyCODONE (Oxy IR/ROXICODONE) immediate release tablet 5 mg (5 mg Oral Given 09/21/20 0718)  ibuprofen (ADVIL) tablet 800 mg (800 mg Oral Given 09/21/20 0718)  ondansetron (ZOFRAN-ODT) disintegrating tablet 4 mg (4 mg Oral Given 09/21/20 0719)  nitrofurantoin (macrocrystal-monohydrate) (MACROBID) capsule 100 mg (100 mg Oral Given 09/21/20 [id number])    ED Course  I have reviewed the triage vital signs and the nursing notes.  Pertinent labs & imaging results that were available during my care of the patient were reviewed by me and considered in my medical decision making (see chart for details).  This patient complains of  back pain, difficulty urinating.  This involves an extensive number of treatment options, and is a complaint that carries with it a high risk of complications and morbidity.  The differential diagnosis  includes UTI vs kidney stone vs bladder spasm  Less likely cauda equina - no hx of falls or trauma, no lower extremity weakness or symptoms, no saddle anesthesia, sx began while sleeping last night.  Less likely spinal abscess -  no fevers or infectious symptoms.  I ordered, reviewed, and interpreted labs.  UA without evidence of infection. I ordered medication for pain I ordered imaging studies which included CT renal study - no focal abnormalities noted I independently visualized and interpreted imaging which showed no life-threatening abnormalities.   Clinical Course as of 09/21/20 1723  Fri Sep 21, 2020  6144 IMPRESSION: 1. No hydronephrosis or urolithiasis. 2. Hepatic steatosis. 3. Cholecystectomy, appendectomy, and hysterectomy. [MT]  [id number] Patient was able to urinate again following her CT scan.  On 2 subsequent bladder scans she had 0 urine in her bladder.  She did describe some dysuria and burning when she urinated.  Based on his clinical presentation I think it be reasonable to start her on Keflex for possible UTI.  We can also prescribe oxybutynin and bowel for potential bladder spasm.  I do not think she needs a Foley catheter at this time.  Advised that she call to make a follow-up appointment with urology next week.  We discussed return precautions with the patient and her husband [MT]  0855 Correction, because she has anaphylaxis allergies to penicillins, will use Macrobid instead of Keflex. [MT]  [id number] After the patient was discharged was notified by the pharmacy that the [facility] suppositories were unavailable anywhere in their system.  I contacted to other pharmacies were also told this medication is on backorder none available.  Therefore I sent a prescription for oxycodone instead to her pharmacy. [MT]    Clinical Course User Index [MT] Wentworth Edelen, Kermit Balo, MD    Final Clinical Impression(s) / ED Diagnoses Final diagnoses:  Urinary urgency    Rx / DC Orders ED Discharge  Orders          Ordered    belladonna-opium (B&O SUPPRETTES) 16.2-30 MG suppository  2 times daily PRN,   Status:  Discontinued        09/21/20 0858    ondansetron (ZOFRAN ODT) 4 MG disintegrating tablet  Every 8 hours PRN        09/21/20 0858    nitrofurantoin, macrocrystal-monohydrate, (MACROBID) 100 MG capsule  2 times daily        09/21/20 0858    tamsulosin (FLOMAX) 0.4 MG CAPS capsule  Daily after breakfast        09/21/20 0858    oxyCODONE (ROXICODONE) 5 MG immediate release tablet  Every 6 hours PRN        09/21/20 0923             Terald Sleeper, MD 09/21/20 1724

## 2020-10-09 ENCOUNTER — Other Ambulatory Visit: Payer: Self-pay | Admitting: Family Medicine

## 2020-10-09 DIAGNOSIS — J4520 Mild intermittent asthma, uncomplicated: Secondary | ICD-10-CM

## 2020-10-18 ENCOUNTER — Other Ambulatory Visit: Payer: Self-pay | Admitting: Family Medicine

## 2020-10-18 DIAGNOSIS — Z8669 Personal history of other diseases of the nervous system and sense organs: Secondary | ICD-10-CM

## 2020-10-29 ENCOUNTER — Other Ambulatory Visit: Payer: Self-pay | Admitting: Family Medicine

## 2020-10-29 DIAGNOSIS — M170 Bilateral primary osteoarthritis of knee: Secondary | ICD-10-CM

## 2020-11-28 ENCOUNTER — Other Ambulatory Visit: Payer: Self-pay | Admitting: Family Medicine

## 2020-11-28 DIAGNOSIS — R109 Unspecified abdominal pain: Secondary | ICD-10-CM

## 2020-11-28 MED ORDER — TRAMADOL HCL 50 MG PO TABS: 50.0000 mg | ORAL_TABLET | Freq: Three times a day (TID) | ORAL | 0 refills | Status: DC | PRN

## 2020-11-28 NOTE — Telephone Encounter (Signed)
Requesting: tramadol Contract:  UDS: 03/29/18 Last Visit: 04/23/20 Next Visit: 05/02/21 Last Refill: 08/29/20  Please Advise

## 2020-12-05 ENCOUNTER — Other Ambulatory Visit: Payer: Self-pay | Admitting: Family Medicine

## 2020-12-05 DIAGNOSIS — F411 Generalized anxiety disorder: Secondary | ICD-10-CM

## 2020-12-05 NOTE — Telephone Encounter (Signed)
Requesting: alprazolam 0.5mg   Contract: 09/10/2017 UDS: 03/29/2018 Last Visit: 04/23/2020 Next Visit: 05/02/2021 Last Refill:08/06/2020 #90 and 1RF Pt sig: 1 tab tid prn  Please Advise

## 2020-12-08 ENCOUNTER — Telehealth: Payer: BC Managed Care – PPO | Admitting: Nurse Practitioner

## 2020-12-08 DIAGNOSIS — R059 Cough, unspecified: Secondary | ICD-10-CM

## 2020-12-08 DIAGNOSIS — R42 Dizziness and giddiness: Secondary | ICD-10-CM

## 2020-12-08 MED ORDER — BENZONATATE 100 MG PO CAPS: 100.0000 mg | ORAL_CAPSULE | Freq: Three times a day (TID) | ORAL | 0 refills | Status: DC | PRN

## 2020-12-08 MED ORDER — PREDNISONE 10 MG (21) PO TBPK: ORAL_TABLET | ORAL | 0 refills | Status: DC

## 2020-12-08 NOTE — Progress Notes (Signed)
We are sorry that you are not feeling well.  Here is how we plan to help!  Based on your presentation I believe you most likely have A cough due to a virus.  This is called viral bronchitis and is best treated by rest, plenty of fluids and control of the cough.  You may use Ibuprofen or Tylenol as directed to help your symptoms.     In addition you may use A prescription cough medication called Tessalon Perles 100mg . You may take 1-2 capsules every 8 hours as needed for your cough. We also advise that your continue use of your inhalers as directed.   Prednisone 10 mg daily for 6 days (see taper instructions below) Meds ordered this encounter  Medications   benzonatate (TESSALON) 100 MG capsule    Sig: Take 1 capsule (100 mg total) by mouth 3 (three) times daily as needed for cough.    Dispense:  30 capsule    Refill:  0   predniSONE (STERAPRED UNI-PAK 21 TAB) 10 MG (21) TBPK tablet    Sig: Take 6 tablets on day one, 5 on day two, 4 on day three, 3 on day four, 2 on day five, and 1 on day six. Take with food.    Dispense:  21 tablet    Refill:  0    You can use your Meclizine as needed for Vertigo symptoms as well.  From your responses in the eVisit questionnaire you describe inflammation in the upper respiratory tract which is causing a significant cough.  This is commonly called Bronchitis and has four common causes:   Allergies Viral Infections Acid Reflux Bacterial Infection Allergies, viruses and acid reflux are treated by controlling symptoms or eliminating the cause. An example might be a cough caused by taking certain blood pressure medications. You stop the cough by changing the medication. Another example might be a cough caused by acid reflux. Controlling the reflux helps control the cough.  USE OF BRONCHODILATOR ("RESCUE") INHALERS: There is a risk from using your bronchodilator too frequently.  The risk is that over-reliance on a medication which only relaxes the muscles  surrounding the breathing tubes can reduce the effectiveness of medications prescribed to reduce swelling and congestion of the tubes themselves.  Although you feel brief relief from the bronchodilator inhaler, your asthma may actually be worsening with the tubes becoming more swollen and filled with mucus.  This can delay other crucial treatments, such as oral steroid medications. If you need to use a bronchodilator inhaler daily, several times per day, you should discuss this with your provider.  There are probably better treatments that could be used to keep your asthma under control.     HOME CARE Only take medications as instructed by your medical team. Complete the entire course of an antibiotic. Drink plenty of fluids and get plenty of rest. Avoid close contacts especially the very young and the elderly Cover your mouth if you cough or cough into your sleeve. Always remember to wash your hands A steam or ultrasonic humidifier can help congestion.   GET HELP RIGHT AWAY IF: You develop worsening fever. You become short of breath You cough up blood. Your symptoms persist after you have completed your treatment plan MAKE SURE YOU  Understand these instructions. Will watch your condition. Will get help right away if you are not doing well or get worse.    Thank you for choosing an e-visit.  Your e-visit answers were reviewed by a board  certified advanced clinical practitioner to complete your personal care plan. Depending upon the condition, your plan could have included both over the counter or prescription medications.  Please review your pharmacy choice. Make sure the pharmacy is open so you can pick up prescription now. If there is a problem, you may contact your provider through [employer] and have the prescription routed to another pharmacy.  Your safety is important to [place]. If you have drug allergies check your prescription carefully.   For the next 24 hours you can use  MyChart to ask questions about today's visit, request a non-urgent call back, or ask for a work or school excuse. You will get an email in the next two days asking about your experience. I hope that your e-visit has been valuable and will speed your recovery.   I spent approximately 10 minutes reviewing the patient's history, current symptoms and coordinating their care today.

## 2020-12-10 ENCOUNTER — Encounter: Payer: Self-pay | Admitting: Family Medicine

## 2020-12-10 ENCOUNTER — Other Ambulatory Visit: Payer: Self-pay | Admitting: Family Medicine

## 2020-12-10 DIAGNOSIS — J40 Bronchitis, not specified as acute or chronic: Secondary | ICD-10-CM

## 2020-12-10 MED ORDER — AZITHROMYCIN 250 MG PO TABS: ORAL_TABLET | ORAL | 0 refills | Status: DC

## 2020-12-13 ENCOUNTER — Other Ambulatory Visit: Payer: Self-pay | Admitting: Family Medicine

## 2020-12-13 DIAGNOSIS — F411 Generalized anxiety disorder: Secondary | ICD-10-CM

## 2020-12-13 DIAGNOSIS — H8110 Benign paroxysmal vertigo, unspecified ear: Secondary | ICD-10-CM

## 2020-12-13 MED ORDER — ALPRAZOLAM 0.5 MG PO TABS: ORAL_TABLET | ORAL | 1 refills | Status: DC

## 2020-12-13 MED ORDER — MECLIZINE HCL 25 MG PO CHEW
CHEWABLE_TABLET | ORAL | 1 refills | Status: DC
Start: 2020-12-13 — End: 2021-09-12

## 2020-12-13 NOTE — Telephone Encounter (Signed)
Requesting: alprazolam 0.5mg   Contract: 03/29/2018 UDS: 03/29/2018 Last Visit: 04/23/2020 Next Visit: 05/02/2021 Last Refill: 12/05/2020 #90 and 0RF  Please Advise

## 2021-01-01 DIAGNOSIS — Z79899 Other long term (current) drug therapy: Secondary | ICD-10-CM | POA: Diagnosis not present

## 2021-01-01 DIAGNOSIS — Z01810 Encounter for preprocedural cardiovascular examination: Secondary | ICD-10-CM | POA: Diagnosis not present

## 2021-01-01 DIAGNOSIS — Z01812 Encounter for preprocedural laboratory examination: Secondary | ICD-10-CM | POA: Diagnosis not present

## 2021-01-01 DIAGNOSIS — Z01818 Encounter for other preprocedural examination: Secondary | ICD-10-CM | POA: Diagnosis not present

## 2021-01-08 DIAGNOSIS — M1711 Unilateral primary osteoarthritis, right knee: Secondary | ICD-10-CM | POA: Diagnosis not present

## 2021-01-10 ENCOUNTER — Other Ambulatory Visit: Payer: Self-pay | Admitting: Family Medicine

## 2021-01-10 ENCOUNTER — Encounter: Payer: Self-pay | Admitting: Family Medicine

## 2021-01-10 DIAGNOSIS — J4520 Mild intermittent asthma, uncomplicated: Secondary | ICD-10-CM

## 2021-01-10 MED ORDER — ALBUTEROL SULFATE HFA 108 (90 BASE) MCG/ACT IN AERS: INHALATION_SPRAY | RESPIRATORY_TRACT | 0 refills | Status: DC

## 2021-01-11 MED ORDER — FLUTICASONE-SALMETEROL 100-50 MCG/ACT IN AEPB: INHALATION_SPRAY | RESPIRATORY_TRACT | 2 refills | Status: DC

## 2021-01-15 DIAGNOSIS — Z91041 Radiographic dye allergy status: Secondary | ICD-10-CM | POA: Diagnosis not present

## 2021-01-15 DIAGNOSIS — Z791 Long term (current) use of non-steroidal anti-inflammatories (NSAID): Secondary | ICD-10-CM | POA: Diagnosis not present

## 2021-01-15 DIAGNOSIS — M1711 Unilateral primary osteoarthritis, right knee: Secondary | ICD-10-CM | POA: Diagnosis not present

## 2021-01-15 DIAGNOSIS — I10 Essential (primary) hypertension: Secondary | ICD-10-CM | POA: Diagnosis not present

## 2021-01-15 DIAGNOSIS — Z7951 Long term (current) use of inhaled steroids: Secondary | ICD-10-CM | POA: Diagnosis not present

## 2021-01-15 DIAGNOSIS — J45909 Unspecified asthma, uncomplicated: Secondary | ICD-10-CM | POA: Diagnosis not present

## 2021-01-15 DIAGNOSIS — Z91013 Allergy to seafood: Secondary | ICD-10-CM | POA: Diagnosis not present

## 2021-01-15 DIAGNOSIS — G8918 Other acute postprocedural pain: Secondary | ICD-10-CM | POA: Diagnosis not present

## 2021-01-15 DIAGNOSIS — K760 Fatty (change of) liver, not elsewhere classified: Secondary | ICD-10-CM | POA: Diagnosis not present

## 2021-01-15 DIAGNOSIS — Z880 Allergy status to penicillin: Secondary | ICD-10-CM | POA: Diagnosis not present

## 2021-01-15 DIAGNOSIS — Z79899 Other long term (current) drug therapy: Secondary | ICD-10-CM | POA: Diagnosis not present

## 2021-01-15 DIAGNOSIS — F419 Anxiety disorder, unspecified: Secondary | ICD-10-CM | POA: Diagnosis not present

## 2021-01-15 DIAGNOSIS — Z885 Allergy status to narcotic agent status: Secondary | ICD-10-CM | POA: Diagnosis not present

## 2021-01-15 DIAGNOSIS — Z90710 Acquired absence of both cervix and uterus: Secondary | ICD-10-CM | POA: Diagnosis not present

## 2021-01-15 DIAGNOSIS — Z96653 Presence of artificial knee joint, bilateral: Secondary | ICD-10-CM | POA: Diagnosis not present

## 2021-01-15 DIAGNOSIS — G4730 Sleep apnea, unspecified: Secondary | ICD-10-CM | POA: Diagnosis not present

## 2021-01-15 DIAGNOSIS — Z9049 Acquired absence of other specified parts of digestive tract: Secondary | ICD-10-CM | POA: Diagnosis not present

## 2021-01-15 HISTORY — PX: REPLACEMENT TOTAL KNEE: SUR1224

## 2021-01-16 DIAGNOSIS — M1711 Unilateral primary osteoarthritis, right knee: Secondary | ICD-10-CM | POA: Diagnosis not present

## 2021-01-16 DIAGNOSIS — Z885 Allergy status to narcotic agent status: Secondary | ICD-10-CM | POA: Diagnosis not present

## 2021-01-16 DIAGNOSIS — K760 Fatty (change of) liver, not elsewhere classified: Secondary | ICD-10-CM | POA: Diagnosis not present

## 2021-01-16 DIAGNOSIS — Z7951 Long term (current) use of inhaled steroids: Secondary | ICD-10-CM | POA: Diagnosis not present

## 2021-01-16 DIAGNOSIS — Z91013 Allergy to seafood: Secondary | ICD-10-CM | POA: Diagnosis not present

## 2021-01-16 DIAGNOSIS — I10 Essential (primary) hypertension: Secondary | ICD-10-CM | POA: Diagnosis not present

## 2021-01-16 DIAGNOSIS — J45909 Unspecified asthma, uncomplicated: Secondary | ICD-10-CM | POA: Diagnosis not present

## 2021-01-16 DIAGNOSIS — F419 Anxiety disorder, unspecified: Secondary | ICD-10-CM | POA: Diagnosis not present

## 2021-01-16 DIAGNOSIS — Z90710 Acquired absence of both cervix and uterus: Secondary | ICD-10-CM | POA: Diagnosis not present

## 2021-01-16 DIAGNOSIS — Z96653 Presence of artificial knee joint, bilateral: Secondary | ICD-10-CM | POA: Diagnosis not present

## 2021-01-16 DIAGNOSIS — Z91041 Radiographic dye allergy status: Secondary | ICD-10-CM | POA: Diagnosis not present

## 2021-01-16 DIAGNOSIS — Z9049 Acquired absence of other specified parts of digestive tract: Secondary | ICD-10-CM | POA: Diagnosis not present

## 2021-01-16 DIAGNOSIS — Z79899 Other long term (current) drug therapy: Secondary | ICD-10-CM | POA: Diagnosis not present

## 2021-01-16 DIAGNOSIS — Z880 Allergy status to penicillin: Secondary | ICD-10-CM | POA: Diagnosis not present

## 2021-01-16 DIAGNOSIS — Z791 Long term (current) use of non-steroidal anti-inflammatories (NSAID): Secondary | ICD-10-CM | POA: Diagnosis not present

## 2021-01-16 DIAGNOSIS — G4730 Sleep apnea, unspecified: Secondary | ICD-10-CM | POA: Diagnosis not present

## 2021-01-23 DIAGNOSIS — Z96651 Presence of right artificial knee joint: Secondary | ICD-10-CM | POA: Diagnosis not present

## 2021-01-23 DIAGNOSIS — M25561 Pain in right knee: Secondary | ICD-10-CM | POA: Diagnosis not present

## 2021-01-23 DIAGNOSIS — M1711 Unilateral primary osteoarthritis, right knee: Secondary | ICD-10-CM | POA: Diagnosis not present

## 2021-01-25 DIAGNOSIS — M1711 Unilateral primary osteoarthritis, right knee: Secondary | ICD-10-CM | POA: Diagnosis not present

## 2021-01-25 DIAGNOSIS — Z96651 Presence of right artificial knee joint: Secondary | ICD-10-CM | POA: Diagnosis not present

## 2021-01-25 DIAGNOSIS — M25561 Pain in right knee: Secondary | ICD-10-CM | POA: Diagnosis not present

## 2021-01-29 DIAGNOSIS — Z96651 Presence of right artificial knee joint: Secondary | ICD-10-CM | POA: Diagnosis not present

## 2021-01-29 DIAGNOSIS — M25561 Pain in right knee: Secondary | ICD-10-CM | POA: Diagnosis not present

## 2021-01-29 DIAGNOSIS — M1711 Unilateral primary osteoarthritis, right knee: Secondary | ICD-10-CM | POA: Diagnosis not present

## 2021-01-31 ENCOUNTER — Other Ambulatory Visit: Payer: Self-pay | Admitting: Family Medicine

## 2021-01-31 DIAGNOSIS — R109 Unspecified abdominal pain: Secondary | ICD-10-CM

## 2021-01-31 MED ORDER — TRAMADOL HCL 50 MG PO TABS: 50.0000 mg | ORAL_TABLET | Freq: Three times a day (TID) | ORAL | 0 refills | Status: DC | PRN

## 2021-01-31 NOTE — Telephone Encounter (Signed)
Requesting: tramadol 50mg   Contract: 09/10/2017 UDS: 03/29/2018 Last Visit: 04/23/2020 Next Visit: 05/02/2021 Last Refill: 11/28/2020 #30 and 0RF  Please Advise

## 2021-02-01 DIAGNOSIS — M25561 Pain in right knee: Secondary | ICD-10-CM | POA: Diagnosis not present

## 2021-02-01 DIAGNOSIS — M1711 Unilateral primary osteoarthritis, right knee: Secondary | ICD-10-CM | POA: Diagnosis not present

## 2021-02-01 DIAGNOSIS — Z96651 Presence of right artificial knee joint: Secondary | ICD-10-CM | POA: Diagnosis not present

## 2021-02-04 DIAGNOSIS — Z96651 Presence of right artificial knee joint: Secondary | ICD-10-CM | POA: Diagnosis not present

## 2021-02-05 DIAGNOSIS — Z96651 Presence of right artificial knee joint: Secondary | ICD-10-CM | POA: Diagnosis not present

## 2021-02-05 DIAGNOSIS — M1711 Unilateral primary osteoarthritis, right knee: Secondary | ICD-10-CM | POA: Diagnosis not present

## 2021-02-05 DIAGNOSIS — M25561 Pain in right knee: Secondary | ICD-10-CM | POA: Diagnosis not present

## 2021-02-11 ENCOUNTER — Encounter: Payer: Self-pay | Admitting: Family Medicine

## 2021-02-11 DIAGNOSIS — M170 Bilateral primary osteoarthritis of knee: Secondary | ICD-10-CM

## 2021-02-11 MED ORDER — DICLOFENAC SODIUM 75 MG PO TBEC: 75.0000 mg | DELAYED_RELEASE_TABLET | Freq: Two times a day (BID) | ORAL | 1 refills | Status: DC

## 2021-02-12 DIAGNOSIS — M1711 Unilateral primary osteoarthritis, right knee: Secondary | ICD-10-CM | POA: Diagnosis not present

## 2021-02-12 DIAGNOSIS — Z96651 Presence of right artificial knee joint: Secondary | ICD-10-CM | POA: Diagnosis not present

## 2021-02-12 DIAGNOSIS — M25561 Pain in right knee: Secondary | ICD-10-CM | POA: Diagnosis not present

## 2021-02-14 ENCOUNTER — Other Ambulatory Visit: Payer: Self-pay | Admitting: Family Medicine

## 2021-02-14 DIAGNOSIS — F411 Generalized anxiety disorder: Secondary | ICD-10-CM

## 2021-02-19 DIAGNOSIS — Z96651 Presence of right artificial knee joint: Secondary | ICD-10-CM | POA: Diagnosis not present

## 2021-02-19 DIAGNOSIS — M25561 Pain in right knee: Secondary | ICD-10-CM | POA: Diagnosis not present

## 2021-02-19 DIAGNOSIS — M1711 Unilateral primary osteoarthritis, right knee: Secondary | ICD-10-CM | POA: Diagnosis not present

## 2021-02-21 DIAGNOSIS — M25561 Pain in right knee: Secondary | ICD-10-CM | POA: Diagnosis not present

## 2021-02-21 DIAGNOSIS — Z96651 Presence of right artificial knee joint: Secondary | ICD-10-CM | POA: Diagnosis not present

## 2021-02-21 DIAGNOSIS — M1711 Unilateral primary osteoarthritis, right knee: Secondary | ICD-10-CM | POA: Diagnosis not present

## 2021-02-27 DIAGNOSIS — M1711 Unilateral primary osteoarthritis, right knee: Secondary | ICD-10-CM | POA: Diagnosis not present

## 2021-02-27 DIAGNOSIS — Z96651 Presence of right artificial knee joint: Secondary | ICD-10-CM | POA: Diagnosis not present

## 2021-02-27 DIAGNOSIS — M25561 Pain in right knee: Secondary | ICD-10-CM | POA: Diagnosis not present

## 2021-03-06 DIAGNOSIS — M25561 Pain in right knee: Secondary | ICD-10-CM | POA: Diagnosis not present

## 2021-03-06 DIAGNOSIS — Z96651 Presence of right artificial knee joint: Secondary | ICD-10-CM | POA: Diagnosis not present

## 2021-03-06 DIAGNOSIS — M1711 Unilateral primary osteoarthritis, right knee: Secondary | ICD-10-CM | POA: Diagnosis not present

## 2021-03-22 ENCOUNTER — Other Ambulatory Visit: Payer: Self-pay | Admitting: Family Medicine

## 2021-03-22 DIAGNOSIS — M170 Bilateral primary osteoarthritis of knee: Secondary | ICD-10-CM

## 2021-03-25 ENCOUNTER — Telehealth: Payer: BC Managed Care – PPO | Admitting: Physician Assistant

## 2021-03-25 DIAGNOSIS — B9689 Other specified bacterial agents as the cause of diseases classified elsewhere: Secondary | ICD-10-CM

## 2021-03-25 DIAGNOSIS — J4541 Moderate persistent asthma with (acute) exacerbation: Secondary | ICD-10-CM | POA: Diagnosis not present

## 2021-03-25 DIAGNOSIS — J019 Acute sinusitis, unspecified: Secondary | ICD-10-CM | POA: Diagnosis not present

## 2021-03-25 MED ORDER — DOXYCYCLINE HYCLATE 100 MG PO TABS: 100.0000 mg | ORAL_TABLET | Freq: Two times a day (BID) | ORAL | 0 refills | Status: DC

## 2021-03-25 MED ORDER — IPRATROPIUM BROMIDE 0.03 % NA SOLN: 2.0000 | Freq: Two times a day (BID) | NASAL | 0 refills | Status: DC

## 2021-03-25 MED ORDER — PREDNISONE 20 MG PO TABS: 40.0000 mg | ORAL_TABLET | Freq: Every day | ORAL | 0 refills | Status: DC

## 2021-03-25 MED ORDER — BENZONATATE 100 MG PO CAPS: 100.0000 mg | ORAL_CAPSULE | Freq: Three times a day (TID) | ORAL | 0 refills | Status: DC | PRN

## 2021-03-25 NOTE — Progress Notes (Signed)
E-Visit for Sinus Problems  We are sorry that you are not feeling well.  Here is how we plan to help!  Based on what you have shared with me it looks like you have sinusitis.  Sinusitis is inflammation and infection in the sinus cavities of the head.  Based on your presentation I believe you most likely have Acute Bacterial Sinusitis.  This is an infection caused by bacteria and is treated with antibiotics. I have prescribed Doxycycline 100mg  by mouth twice a day for 10 days. Ipratropium nasal spray for sinus congestion x 5-7 days, Tessalon perles for the cough, and Prednisone 40mg  daily for 5 days. You may use an oral decongestant such as Mucinex D or if you have glaucoma or high blood pressure use plain Mucinex. Saline nasal spray help and can safely be used as often as needed for congestion.  If you develop worsening sinus pain, fever or notice severe headache and vision changes, or if symptoms are not better after completion of antibiotic, please schedule an appointment with a health care provider.    Sinus infections are not as easily transmitted as other respiratory infection, however we still recommend that you avoid close contact with loved ones, especially the very young and elderly.  Remember to wash your hands thoroughly throughout the day as this is the number one way to prevent the spread of infection!  Home Care: Only take medications as instructed by your medical team. Complete the entire course of an antibiotic. Do not take these medications with alcohol. A steam or ultrasonic humidifier can help congestion.  You can place a towel over your head and breathe in the steam from hot water coming from a faucet. Avoid close contacts especially the very young and the elderly. Cover your mouth when you cough or sneeze. Always remember to wash your hands.  Get Help Right Away If: You develop worsening fever or sinus pain. You develop a severe head ache or visual changes. Your symptoms  persist after you have completed your treatment plan.  Make sure you Understand these instructions. Will watch your condition. Will get help right away if you are not doing well or get worse.  Thank you for choosing an e-visit.  Your e-visit answers were reviewed by a board certified advanced clinical practitioner to complete your personal care plan. Depending upon the condition, your plan could have included both over the counter or prescription medications.  Please review your pharmacy choice. Make sure the pharmacy is open so you can pick up prescription now. If there is a problem, you may contact your provider through and have the prescription routed to another pharmacy.  Your safety is important to . If you have drug allergies check your prescription carefully.   For the next 24 hours you can use MyChart to ask questions about today's visit, request a non-urgent call back, or ask for a work or school excuse. You will get an email in the next two days asking about your experience. I hope that your e-visit has been valuable and will speed your recovery.  I provided 5 minutes of non face-to-face time during this encounter for chart review and documentation.

## 2021-04-10 ENCOUNTER — Other Ambulatory Visit: Payer: Self-pay

## 2021-04-10 DIAGNOSIS — J45909 Unspecified asthma, uncomplicated: Secondary | ICD-10-CM

## 2021-04-10 DIAGNOSIS — Z8669 Personal history of other diseases of the nervous system and sense organs: Secondary | ICD-10-CM

## 2021-04-10 MED ORDER — MONTELUKAST SODIUM 10 MG PO TABS: 10.0000 mg | ORAL_TABLET | Freq: Every day | ORAL | 3 refills | Status: DC

## 2021-04-10 MED ORDER — TOPIRAMATE 50 MG PO TABS: 50.0000 mg | ORAL_TABLET | Freq: Two times a day (BID) | ORAL | 1 refills | Status: DC

## 2021-04-10 MED ORDER — SUMATRIPTAN SUCCINATE 100 MG PO TABS: ORAL_TABLET | ORAL | 3 refills | Status: DC

## 2021-04-25 ENCOUNTER — Encounter: Payer: BC Managed Care – PPO | Admitting: Family Medicine

## 2021-05-02 ENCOUNTER — Encounter: Payer: BC Managed Care – PPO | Admitting: Family Medicine

## 2021-05-02 ENCOUNTER — Encounter: Payer: Self-pay | Admitting: Family Medicine

## 2021-05-02 ENCOUNTER — Ambulatory Visit (INDEPENDENT_AMBULATORY_CARE_PROVIDER_SITE_OTHER): Payer: BC Managed Care – PPO | Admitting: Family Medicine

## 2021-05-02 ENCOUNTER — Other Ambulatory Visit (HOSPITAL_BASED_OUTPATIENT_CLINIC_OR_DEPARTMENT_OTHER): Payer: Self-pay | Admitting: Family Medicine

## 2021-05-02 ENCOUNTER — Telehealth: Payer: Self-pay

## 2021-05-02 VITALS — BP 135/80 | HR 86 | Temp 98.0°F | Resp 16 | Ht 59.0 in | Wt 211.8 lb

## 2021-05-02 DIAGNOSIS — Z0001 Encounter for general adult medical examination with abnormal findings: Secondary | ICD-10-CM

## 2021-05-02 DIAGNOSIS — J4520 Mild intermittent asthma, uncomplicated: Secondary | ICD-10-CM | POA: Diagnosis not present

## 2021-05-02 DIAGNOSIS — J45909 Unspecified asthma, uncomplicated: Secondary | ICD-10-CM

## 2021-05-02 DIAGNOSIS — Z8669 Personal history of other diseases of the nervous system and sense organs: Secondary | ICD-10-CM | POA: Diagnosis not present

## 2021-05-02 DIAGNOSIS — Z0000 Encounter for general adult medical examination without abnormal findings: Secondary | ICD-10-CM

## 2021-05-02 DIAGNOSIS — Z3683 Encounter for fetal screening for congenital cardiac abnormalities: Secondary | ICD-10-CM | POA: Diagnosis not present

## 2021-05-02 DIAGNOSIS — F418 Other specified anxiety disorders: Secondary | ICD-10-CM | POA: Diagnosis not present

## 2021-05-02 DIAGNOSIS — Z1211 Encounter for screening for malignant neoplasm of colon: Secondary | ICD-10-CM

## 2021-05-02 DIAGNOSIS — M170 Bilateral primary osteoarthritis of knee: Secondary | ICD-10-CM | POA: Diagnosis not present

## 2021-05-02 DIAGNOSIS — Z79899 Other long term (current) drug therapy: Secondary | ICD-10-CM | POA: Diagnosis not present

## 2021-05-02 DIAGNOSIS — F411 Generalized anxiety disorder: Secondary | ICD-10-CM | POA: Diagnosis not present

## 2021-05-02 DIAGNOSIS — Z1231 Encounter for screening mammogram for malignant neoplasm of breast: Secondary | ICD-10-CM

## 2021-05-02 MED ORDER — DICLOFENAC SODIUM 75 MG PO TBEC: 75.0000 mg | DELAYED_RELEASE_TABLET | Freq: Two times a day (BID) | ORAL | 1 refills | Status: DC

## 2021-05-02 MED ORDER — ALPRAZOLAM 0.5 MG PO TABS: ORAL_TABLET | ORAL | 1 refills | Status: DC

## 2021-05-02 MED ORDER — FLUTICASONE-SALMETEROL 100-50 MCG/ACT IN AEPB: INHALATION_SPRAY | RESPIRATORY_TRACT | 2 refills | Status: DC

## 2021-05-02 MED ORDER — ESCITALOPRAM OXALATE 20 MG PO TABS: 20.0000 mg | ORAL_TABLET | Freq: Every day | ORAL | 3 refills | Status: DC

## 2021-05-02 MED ORDER — TOPIRAMATE 50 MG PO TABS: 50.0000 mg | ORAL_TABLET | Freq: Two times a day (BID) | ORAL | 1 refills | Status: DC

## 2021-05-02 MED ORDER — KETOROLAC TROMETHAMINE 60 MG/2ML IM SOLN
60.0000 mg | Freq: Once | INTRAMUSCULAR | Status: AC
  Administered 2021-05-02: 60 mg via INTRAMUSCULAR

## 2021-05-02 MED ORDER — MONTELUKAST SODIUM 10 MG PO TABS: 10.0000 mg | ORAL_TABLET | Freq: Every day | ORAL | 3 refills | Status: DC

## 2021-05-02 MED ORDER — ALBUTEROL SULFATE HFA 108 (90 BASE) MCG/ACT IN AERS: INHALATION_SPRAY | RESPIRATORY_TRACT | 0 refills | Status: DC

## 2021-05-02 MED ORDER — SUMATRIPTAN SUCCINATE 100 MG PO TABS: ORAL_TABLET | ORAL | 3 refills | Status: DC

## 2021-05-02 NOTE — Assessment & Plan Note (Signed)
Stable Refill advair and albuterol

## 2021-05-02 NOTE — Assessment & Plan Note (Signed)
Stable Refill lexapro

## 2021-05-02 NOTE — Assessment & Plan Note (Signed)
con't topamax toradol today Consider neuro referal

## 2021-05-02 NOTE — Progress Notes (Signed)
Subjective:   By signing my name below, I, Shehryar Baig, attest that this documentation has been prepared under the direction and in the presence of Ann Held, DO  05/02/2021    Patient ID: Deborah Watts, female    DOB: October 04, 1965, 56 y.o.   MRN: [id number]  Chief Complaint  Patient presents with   Annual Exam    Here for Annual Exam     HPI Patient is in today for a comprehensive physical exam.  She complains of a frequent headache. She finds her headaches sporadically increase in frequency then eventually reduces. She thinks it is due to the weather. She continues taking 100 mg imitrex PRN and 50 mg topamax 2x daily PO and finds mild relief. She did not take any medications prior to this visit. She typically drinks caffeine in the morning but did not this morning due to her upcomming physical exam. She is interested in receiving a Toradol injection during this visit.  She is requesting a refill on 20 mg lexapro daily PO, 75 mg Voltaren 2x daily PO, 0.5 mg xanax, 100-50 mcg/act advair, 10 mg sinulair daily PO, 100 mg imitrex PRN, 108 mcg/act albuterol. She is recovering well from her knee replacement procedures. She completed her left knee replacement on 06/12/2020 and her right knee on 01/15/2021. She denies having any fever, ear pain, muscle pain, joint pain, new moles, congestion, sinus pain, sore throat, eye pain, chest pain, palpations, cough, SOB, wheezing, n/v/d, constipation, blood in stool, dysuria, frequency or hematuria at this time. She reports her father passed away last year from kidney and heart failure, otherwise she has no other changes to her family medical history.  She reports gaining weight since her last visit but notes losing weight since she returned to work.  She has 3 pfizer Covid-19 vaccines. She is not interested in receiving any more vaccine and pneumonia vaccine around October, 2022.  She is planning on scheduling her mammogram this year.  She did  not complete her cologuard screening. She does not have a cologaurd kit at home. She declines completing a colonoscopy.    Past Medical History:  Diagnosis Date   Anxiety    Arthritis    Asthma    Headache    migraines   History of hiatal hernia    repaired 30 yrs ago in [place]   Hypertension    MHA (microangiopathic hemolytic anemia) (HCC)     Past Surgical History:  Procedure Laterality Date   APPENDECTOMY N/A    LAPAROSCOPIC CHOLECYSTECTOMY N/A 03/1992   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   [facility]   RE[facility]ACEMENT TOTAL KNEE Right 01/15/2021   [facility]   TOTAL ABDOMINAL HYSTERECTOMY Right    TUBAL LIGATION     WRIST ARTHROSCOPY Right 02/12/2018   Procedure: Right wrist arthroscopy, evaluation under anesthesia with debridement and repair as necessary and right carpal tunnel release;  Surgeon: Roseanne Kaufman, MD;  Location: [facility];  Service: Orthopedics;  Laterality: Right;  90 mins    Family History  Problem Relation Age of Onset   Throat cancer Father    Kidney failure Father    Asthma Other    Diabetes Other    Hyperlipidemia Other     Social History   Socioeconomic History   Marital status: Married    Spouse name: Not on file   Number of children: Not on file   Years of education: Not on file   Highest education level: Not on  file  Occupational History   Occupation: Engineer, technical sales: [employer]  Tobacco Use   Smoking status: Never   Smokeless tobacco: Never  Vaping Use   Vaping Use: Never used  Substance and Sexual Activity   Alcohol use: Yes    Alcohol/week: 0.0 standard drinks    Comment: occassional   Drug use: No   Sexual activity: Yes    Partners: Male  Other Topics Concern   Not on file  Social History Narrative   Exercise-- no   Social Determinants of Health   Financial Resource Strain: Not on file  Food Insecurity: Not on file  Transportation Needs: Not on file  Physical Activity: Not on file  Stress: Not on file   Social Connections: Not on file  Intimate Partner Violence: Not on file    Outpatient Medications Prior to Visit  Medication Sig Dispense Refill   benzonatate (TESSALON) 100 MG capsule Take 1 capsule (100 mg total) by mouth 3 (three) times daily as needed. 30 capsule 0   Boswellia Serrata (BOSWELLIA PO) Take 1 capsule by mouth daily.     doxycycline (VIBRA-TABS) 100 MG tablet Take 1 tablet (100 mg total) by mouth 2 (two) times daily. 20 tablet 0   ipratropium (ATROVENT) 0.03 % nasal spray Place 2 sprays into both nostrils every 12 (twelve) hours. 30 mL 0   Meclizine HCl (TRAVEL SICKNESS) 25 MG CHEW CHEW 1 TABLET EVERY 6 HOURS AS NEEDED 30 tablet 1   nystatin (MYCOSTATIN) 100000 UNIT/ML suspension Take 5 mLs (500,000 Units total) by mouth 4 (four) times daily. 60 mL 0   oxyCODONE (ROXICODONE) 5 MG immediate release tablet Take 1 tablet (5 mg total) by mouth every 6 (six) hours as needed for up to 12 doses for severe pain. 12 tablet 0   predniSONE (DELTASONE) 20 MG tablet Take 2 tablets (40 mg total) by mouth daily with breakfast. 10 tablet 0   traMADol (ULTRAM) 50 MG tablet Take 1 tablet (50 mg total) by mouth every 8 (eight) hours as needed. 30 tablet 0   TURMERIC PO Take 1,000 mg by mouth daily.     albuterol (VENTOLIN HFA) 108 (90 Base) MCG/ACT inhaler INHALE 2 PUFFS BY MOUTH EVERY 6 HOURS FOR WHEEZING FOR SHORTNESS OF BREATH 90 g 0   ALPRAZolam (XANAX) 0.5 MG tablet TAKE ONE TABLET BY MOUTH THREE TIMES DAILY AS NEEDED 90 tablet 1   diclofenac (VOLTAREN) 75 MG EC tablet TAKE 1 TABLET BY MOUTH TWICE DAILY 60 tablet 1   escitalopram (LEXAPRO) 20 MG tablet Take 1 tablet (20 mg total) by mouth daily. 90 tablet 3   fluticasone-salmeterol (ADVAIR) 100-50 MCG/ACT AEPB INHALE 2 DOSES BY MOUTH TWICE DAILY 60 each 2   montelukast (SINGULAIR) 10 MG tablet Take 1 tablet (10 mg total) by mouth at bedtime. 90 tablet 3   SUMAtriptan (IMITREX) 100 MG tablet TAKE ONE TABLET BY MOUTH AS NEEDED FOR MIGRAINE.  REPEAT IN 2 HOURS IF NEEDED. 12 tablet 3   topiramate (TOPAMAX) 50 MG tablet Take 1 tablet (50 mg total) by mouth 2 (two) times daily. 180 tablet 1   No facility-administered medications prior to visit.    Allergies  Allergen Reactions   Fish Allergy Hives and Swelling   Penicillins Anaphylaxis, Hives and Other (See Comments)    Has patient had a PCN reaction causing immediate rash, facial/tongue/throat swelling, SOB or lightheadedness with hypotension: Unknown Has patient had a PCN reaction causing severe rash involving mucus  membranes or skin necrosis: No Has patient had a PCN reaction that required hospitalization: Yes Has patient had a PCN reaction occurring within the last 10 years: No If all of the above answers are "NO", then may proceed with Cephalosporin use.    Iohexol Other (See Comments)     Desc: hives, sob, pt. needs 13 hr prep   01/16/05    Ivp Dye [Iodinated Contrast Media] Hives and Swelling   Morphine And Related Nausea And Vomiting    Review of Systems  Constitutional:  Negative for fever.  HENT:  Negative for congestion and sore throat.   Respiratory:  Negative for cough, shortness of breath and wheezing.   Cardiovascular:  Negative for chest pain.  Gastrointestinal:  Negative for blood in stool, constipation, diarrhea, nausea and vomiting.  Genitourinary:  Negative for dysuria, frequency and hematuria.  Musculoskeletal:  Negative for joint pain and myalgias.  Skin:        (-)New moles  Neurological:  Positive for headaches (frequent).      Objective:    Physical Exam Constitutional:      General: She is not in acute distress.    Appearance: Normal appearance. She is not ill-appearing.  HENT:     Head: Normocephalic and atraumatic.     Right Ear: Tympanic membrane, ear canal and external ear normal.     Left Ear: Tympanic membrane, ear canal and external ear normal.  Eyes:     Extraocular Movements: Extraocular movements intact.     Pupils: Pupils  are equal, round, and reactive to light.  Cardiovascular:     Rate and Rhythm: Normal rate and regular rhythm.     Heart sounds: Normal heart sounds. No murmur heard.   No gallop.  Pulmonary:     Effort: Pulmonary effort is normal. No respiratory distress.     Breath sounds: Normal breath sounds. No wheezing or rales.  Abdominal:     General: Bowel sounds are normal. There is no distension.     Palpations: Abdomen is soft.     Tenderness: There is no abdominal tenderness. There is no guarding.  Skin:    General: Skin is warm and dry.  Neurological:     Mental Status: She is alert and oriented to person, place, and time.  Psychiatric:        Behavior: Behavior normal.    BP 135/80 (BP Location: Right Arm, Patient Position: Sitting, Cuff Size: Normal)    Pulse 86    Temp 98 F (36.7 C) (Oral)    Resp 16    Ht '4\' 11"'  (1.499 m)    Wt 211 lb 12.8 oz (96.1 kg)    SpO2 95%    BMI 42.78 kg/m  Wt Readings from Last 3 Encounters:  05/02/21 211 lb 12.8 oz (96.1 kg)  09/21/20 192 lb (87.1 kg)  04/23/20 192 lb (87.1 kg)    Diabetic Foot Exam - Simple   No data filed    Lab Results  Component Value Date   WBC 10.8 (H) 04/23/2020   HGB 13.2 04/23/2020   HCT 41.3 04/23/2020   PLT 308.0 04/23/2020   GLUCOSE 81 04/23/2020   CHOL 148 04/23/2020   TRIG 82.0 04/23/2020   HDL 41.70 04/23/2020   LDLDIRECT 124.0 03/27/2017   LDLCALC 90 04/23/2020   ALT 25 04/23/2020   AST 18 04/23/2020   NA 141 04/23/2020   K 4.6 04/23/2020   CL 107 04/23/2020   CREATININE 0.82 04/23/2020  BUN 16 04/23/2020   CO2 30 04/23/2020   TSH 1.24 04/23/2020    Lab Results  Component Value Date   TSH 1.24 04/23/2020   Lab Results  Component Value Date   WBC 10.8 (H) 04/23/2020   HGB 13.2 04/23/2020   HCT 41.3 04/23/2020   MCV 87.8 04/23/2020   PLT 308.0 04/23/2020   Lab Results  Component Value Date   NA 141 04/23/2020   K 4.6 04/23/2020   CO2 30 04/23/2020   GLUCOSE 81 04/23/2020   BUN 16  04/23/2020   CREATININE 0.82 04/23/2020   BILITOT 0.4 04/23/2020   ALKPHOS 75 04/23/2020   AST 18 04/23/2020   ALT 25 04/23/2020   PROT 6.6 04/23/2020   ALBUMIN 4.2 04/23/2020   CALCIUM 9.3 04/23/2020   ANIONGAP 8 02/03/2018   GFR 80.30 04/23/2020   Lab Results  Component Value Date   CHOL 148 04/23/2020   Lab Results  Component Value Date   HDL 41.70 04/23/2020   Lab Results  Component Value Date   LDLCALC 90 04/23/2020   Lab Results  Component Value Date   TRIG 82.0 04/23/2020   Lab Results  Component Value Date   CHOLHDL 4 04/23/2020   No results found for: HGBA1C  Mammogram- Last completed 04/23/2021. Results are normal. Repeat in 1 year.  Pap Smear- Last completed 01/06/2013.  Cologaurd- Not yet completed.      Assessment & Plan:   Problem List Items Addressed This Visit       Unprioritized   Preventative health care - Primary   Relevant Orders   CBC with Differential/Platelet   Comprehensive metabolic panel   Lipid panel   TSH   Other Visit Diagnoses     Depression with anxiety       Relevant Medications   ALPRAZolam (XANAX) 0.5 MG tablet   escitalopram (LEXAPRO) 20 MG tablet   Primary osteoarthritis of both knees       Relevant Medications   diclofenac (VOLTAREN) 75 MG EC tablet   ketorolac (TORADOL) injection 60 mg (Completed)   Colon cancer screening       Relevant Orders   Cologuard   Mild intermittent asthma, unspecified whether complicated       Relevant Medications   albuterol (VENTOLIN HFA) 108 (90 Base) MCG/ACT inhaler   fluticasone-salmeterol (ADVAIR) 100-50 MCG/ACT AEPB   montelukast (SINGULAIR) 10 MG tablet   Generalized anxiety disorder       Relevant Medications   ALPRAZolam (XANAX) 0.5 MG tablet   escitalopram (LEXAPRO) 20 MG tablet   Asthma, chronic, unspecified asthma severity, uncomplicated       Relevant Medications   albuterol (VENTOLIN HFA) 108 (90 Base) MCG/ACT inhaler   fluticasone-salmeterol (ADVAIR) 100-50  MCG/ACT AEPB   montelukast (SINGULAIR) 10 MG tablet   Hx of migraines       Relevant Medications   topiramate (TOPAMAX) 50 MG tablet   SUMAtriptan (IMITREX) 100 MG tablet   ketorolac (TORADOL) injection 60 mg (Completed)   High risk medication use       Relevant Orders   Drug Monitoring Panel [phone] , Urine        Meds ordered this encounter  Medications   DISCONTD: escitalopram (LEXAPRO) 20 MG tablet    Sig: Take 1 tablet (20 mg total) by mouth daily.    Dispense:  90 tablet    Refill:  3   DISCONTD: diclofenac (VOLTAREN) 75 MG EC tablet    Sig: Take 1 tablet (  75 mg total) by mouth 2 (two) times daily.    Dispense:  60 tablet    Refill:  1   albuterol (VENTOLIN HFA) 108 (90 Base) MCG/ACT inhaler    Sig: INHALE 2 PUFFS BY MOUTH EVERY 6 HOURS FOR WHEEZING FOR SHORTNESS OF BREATH    Dispense:  90 g    Refill:  0   ALPRAZolam (XANAX) 0.5 MG tablet    Sig: TAKE ONE TABLET BY MOUTH THREE TIMES DAILY AS NEEDED    Dispense:  90 tablet    Refill:  1   diclofenac (VOLTAREN) 75 MG EC tablet    Sig: Take 1 tablet (75 mg total) by mouth 2 (two) times daily.    Dispense:  60 tablet    Refill:  1   escitalopram (LEXAPRO) 20 MG tablet    Sig: Take 1 tablet (20 mg total) by mouth daily.    Dispense:  90 tablet    Refill:  3   fluticasone-salmeterol (ADVAIR) 100-50 MCG/ACT AEPB    Sig: INHALE 2 DOSES BY MOUTH TWICE DAILY    Dispense:  60 each    Refill:  2   montelukast (SINGULAIR) 10 MG tablet    Sig: Take 1 tablet (10 mg total) by mouth at bedtime.    Dispense:  90 tablet    Refill:  3   topiramate (TOPAMAX) 50 MG tablet    Sig: Take 1 tablet (50 mg total) by mouth 2 (two) times daily.    Dispense:  180 tablet    Refill:  1   SUMAtriptan (IMITREX) 100 MG tablet    Sig: TAKE ONE TABLET BY MOUTH AS NEEDED FOR MIGRAINE. REPEAT IN 2 HOURS IF NEEDED.    Dispense:  12 tablet    Refill:  3    ONLY 3 TABS LEFT ON RX   ketorolac (TORADOL) injection 60 mg    I, Lowne Chase, [employer], DO, personally preformed the services described in this documentation.  All medical record entries made by the scribe were at my direction and in my presence.  I have reviewed the chart and discharge instructions (if applicable) and agree that the record reflects my personal performance and is accurate and complete. 05/02/2021   I,Shehryar Baig,acting as a scribe for Ann Held, DO.,have documented all relevant documentation on the behalf of Ann Held, DO,as directed by  Ann Held, DO while in the presence of Ann Held, DO.   Ann Held, DO

## 2021-05-02 NOTE — Patient Instructions (Signed)

## 2021-05-02 NOTE — Assessment & Plan Note (Signed)
ghm utd Check labs  See avs  

## 2021-05-03 LAB — COMPREHENSIVE METABOLIC PANEL
ALT: 20 U/L (ref 0–35)
AST: 20 U/L (ref 0–37)
Albumin: 4.5 g/dL (ref 3.5–5.2)
Alkaline Phosphatase: 83 U/L (ref 39–117)
BUN: 19 mg/dL (ref 6–23)
CO2: 30 mEq/L (ref 19–32)
Calcium: 9.6 mg/dL (ref 8.4–10.5)
Chloride: 106 mEq/L (ref 96–112)
Creatinine, Ser: 0.8 mg/dL (ref 0.40–1.20)
GFR: 82.12 mL/min (ref >60.00)
Glucose, Bld: 87 mg/dL (ref 70–99)
Potassium: 4.5 mEq/L (ref 3.5–5.1)
Sodium: 141 mEq/L (ref 135–145)
Total Bilirubin: 0.3 mg/dL (ref 0.2–1.2)
Total Protein: 7 g/dL (ref 6.0–8.3)

## 2021-05-03 LAB — CBC WITH DIFFERENTIAL/PLATELET
Basophils Absolute: 0.1 10*3/uL (ref 0.0–0.1)
Basophils Relative: 1.1 % (ref 0.0–3.0)
Eosinophils Absolute: 0.4 10*3/uL (ref 0.0–0.7)
Eosinophils Relative: 4.3 % (ref 0.0–5.0)
HCT: 43.8 % (ref 36.0–46.0)
Hemoglobin: 14.2 g/dL (ref 12.0–15.0)
Lymphocytes Relative: 23 % (ref 12.0–46.0)
Lymphs Abs: 2.4 10*3/uL (ref 0.7–4.0)
MCHC: 32.5 g/dL (ref 30.0–36.0)
MCV: 85.7 fl (ref 78.0–100.0)
Monocytes Absolute: 0.7 10*3/uL (ref 0.1–1.0)
Monocytes Relative: 6.7 % (ref 3.0–12.0)
Neutro Abs: 6.6 10*3/uL (ref 1.4–7.7)
Neutrophils Relative %: 64.9 % (ref 43.0–77.0)
Platelets: 371 10*3/uL (ref 150.0–400.0)
RBC: 5.11 Mil/uL (ref 3.87–5.11)
RDW: 15 % (ref 11.5–15.5)
WBC: 10.3 10*3/uL (ref 4.0–10.5)

## 2021-05-03 LAB — LIPID PANEL
Cholesterol: 162 mg/dL (ref 0–200)
HDL: 36.2 mg/dL — ABNORMAL LOW (ref >39.00)
LDL Cholesterol: 91 mg/dL (ref 0–99)
NonHDL: 125.88
Total CHOL/HDL Ratio: 4
Triglycerides: 172 mg/dL — ABNORMAL HIGH (ref 0.0–149.0)
VLDL: 34.4 mg/dL (ref 0.0–40.0)

## 2021-05-03 LAB — TSH: TSH: 0.77 u[IU]/mL (ref 0.35–5.50)

## 2021-05-04 LAB — DM TEMPLATE

## 2021-05-05 LAB — DRUG MONITORING PANEL 376104, URINE

## 2021-05-05 LAB — DM TEMPLATE

## 2021-05-06 ENCOUNTER — Inpatient Hospital Stay (HOSPITAL_BASED_OUTPATIENT_CLINIC_OR_DEPARTMENT_OTHER): Admission: RE | Admit: 2021-05-06 | Payer: BC Managed Care – PPO | Source: Ambulatory Visit

## 2021-05-06 ENCOUNTER — Encounter: Payer: Self-pay | Admitting: Family Medicine

## 2021-05-06 ENCOUNTER — Telehealth: Payer: Self-pay

## 2021-05-06 NOTE — Telephone Encounter (Signed)
PA initiated via Covermymeds; KEY: [id number]. Awaiting determination.

## 2021-05-07 ENCOUNTER — Telehealth: Payer: BC Managed Care – PPO | Admitting: Physician Assistant

## 2021-05-07 DIAGNOSIS — R051 Acute cough: Secondary | ICD-10-CM

## 2021-05-07 DIAGNOSIS — J019 Acute sinusitis, unspecified: Secondary | ICD-10-CM | POA: Diagnosis not present

## 2021-05-07 DIAGNOSIS — H1031 Unspecified acute conjunctivitis, right eye: Secondary | ICD-10-CM | POA: Diagnosis not present

## 2021-05-07 DIAGNOSIS — J4541 Moderate persistent asthma with (acute) exacerbation: Secondary | ICD-10-CM | POA: Diagnosis not present

## 2021-05-07 DIAGNOSIS — B9789 Other viral agents as the cause of diseases classified elsewhere: Secondary | ICD-10-CM

## 2021-05-07 MED ORDER — PREDNISONE 20 MG PO TABS: 40.0000 mg | ORAL_TABLET | Freq: Every day | ORAL | 0 refills | Status: DC

## 2021-05-07 MED ORDER — BENZONATATE 100 MG PO CAPS: 100.0000 mg | ORAL_CAPSULE | Freq: Three times a day (TID) | ORAL | 0 refills | Status: DC | PRN

## 2021-05-07 MED ORDER — POLYMYXIN B-TRIMETHOPRIM 10000-0.1 UNIT/ML-% OP SOLN: 1.0000 [drp] | OPHTHALMIC | 0 refills | Status: DC

## 2021-05-07 NOTE — Progress Notes (Signed)

## 2021-05-08 ENCOUNTER — Telehealth: Payer: Self-pay | Admitting: Family Medicine

## 2021-05-08 MED ORDER — FLUTICASONE FUROATE-VILANTEROL 100-25 MCG/ACT IN AEPB: 1.0000 | INHALATION_SPRAY | Freq: Every day | RESPIRATORY_TRACT | 2 refills | Status: DC

## 2021-05-08 NOTE — Telephone Encounter (Signed)
Per Lowne, send in [place]

## 2021-05-08 NOTE — Telephone Encounter (Signed)
[facility] would like to know if pt has tried breo ellipta, symbicort, or wixela before being prescribed advair. Please advise. Case # [id number].

## 2021-05-08 NOTE — Telephone Encounter (Signed)
Rx sent 

## 2021-05-08 NOTE — Telephone Encounter (Signed)
PA denied. Formulary alternatives: Earlie Server, Wixela, Symbicort.

## 2021-05-10 ENCOUNTER — Telehealth: Payer: Self-pay

## 2021-05-10 NOTE — Telephone Encounter (Signed)
Open in error from refill pool

## 2021-05-10 NOTE — Telephone Encounter (Signed)
PA initiated via Covermymeds; KEY: [id number]. Awaiting determination.

## 2021-05-13 ENCOUNTER — Telehealth: Payer: BC Managed Care – PPO | Admitting: Physician Assistant

## 2021-05-13 ENCOUNTER — Ambulatory Visit (HOSPITAL_BASED_OUTPATIENT_CLINIC_OR_DEPARTMENT_OTHER): Payer: BC Managed Care – PPO

## 2021-05-13 DIAGNOSIS — B9689 Other specified bacterial agents as the cause of diseases classified elsewhere: Secondary | ICD-10-CM

## 2021-05-13 DIAGNOSIS — J208 Acute bronchitis due to other specified organisms: Secondary | ICD-10-CM

## 2021-05-13 MED ORDER — AZITHROMYCIN 250 MG PO TABS: ORAL_TABLET | ORAL | 0 refills | Status: AC

## 2021-05-13 MED ORDER — BREO ELLIPTA 100-25 MCG/ACT IN AEPB: 1.0000 | INHALATION_SPRAY | Freq: Every day | RESPIRATORY_TRACT | 2 refills | Status: DC

## 2021-05-13 NOTE — Telephone Encounter (Signed)
PA denied.   Request Reference Number: [id number]. FLUTIC/VILAN INH 100-25 is denied due to Plan Exclusion. For further questions, call [phone]  Was informed when we did PA for Advair that Breo Ellipta was a formulary alternative- now showing not covered under plan. Maybe they cover brand name only? Will try to send Rx.

## 2021-05-13 NOTE — Progress Notes (Signed)
We are sorry that you are not feeling well.  Here is how we plan to help! ? ?Based on your presentation I believe you most likely have A cough due to bacteria.  When patients have a fever and a productive cough with a change in color or increased sputum production, we are concerned about bacterial bronchitis.  If left untreated it can progress to pneumonia.  If your symptoms do not improve with your treatment plan it is important that you contact your provider.   I have prescribed Azithromyin 250 mg: two tablets now and then one tablet daily for 4 additonal days  ?  ?In addition you may use A non-prescription cough medication called Mucinex DM: take 2 tablets every 12 hours. ? ?From your responses in the eVisit questionnaire you describe inflammation in the upper respiratory tract which is causing a significant cough.  This is commonly called Bronchitis and has four common causes:   ?Allergies ?Viral Infections ?Acid Reflux ?Bacterial Infection ?Allergies, viruses and acid reflux are treated by controlling symptoms or eliminating the cause. An example might be a cough caused by taking certain blood pressure medications. You stop the cough by changing the medication. Another example might be a cough caused by acid reflux. Controlling the reflux helps control the cough. ? ?USE OF BRONCHODILATOR ("RESCUE") INHALERS: ?There is a risk from using your bronchodilator too frequently.  The risk is that over-reliance on a medication which only relaxes the muscles surrounding the breathing tubes can reduce the effectiveness of medications prescribed to reduce swelling and congestion of the tubes themselves.  Although you feel brief relief from the bronchodilator inhaler, your asthma may actually be worsening with the tubes becoming more swollen and filled with mucus.  This can delay other crucial treatments, such as oral steroid medications. If you need to use a bronchodilator inhaler daily, several times per day, you should  discuss this with your provider.  There are probably better treatments that could be used to keep your asthma under control.  ?   ?HOME CARE ?Only take medications as instructed by your medical team. ?Complete the entire course of an antibiotic. ?Drink plenty of fluids and get plenty of rest. ?Avoid close contacts especially the very young and the elderly ?Cover your mouth if you cough or cough into your sleeve. ?Always remember to wash your hands ?A steam or ultrasonic humidifier can help congestion.  ? ?GET HELP RIGHT AWAY IF: ?You develop worsening fever. ?You become short of breath ?You cough up blood. ?Your symptoms persist after you have completed your treatment plan ?MAKE SURE YOU  ?Understand these instructions. ?Will watch your condition. ?Will get help right away if you are not doing well or get worse. ?  ? ?Thank you for choosing an e-visit. ? ?Your e-visit answers were reviewed by a board certified advanced clinical practitioner to complete your personal care plan. Depending upon the condition, your plan could have included both over the counter or prescription medications. ? ?Please review your pharmacy choice. Make sure the pharmacy is open so you can pick up prescription now. If there is a problem, you may contact your provider through MyChart messaging and have the prescription routed to another pharmacy.  Your safety is important to us. If you have drug allergies check your prescription carefully.  ? ?For the next 24 hours you can use MyChart to ask questions about today's visit, request a non-urgent call back, or ask for a work or school excuse. ?You will get   an email in the next two days asking about your experience. I hope that your e-visit has been valuable and will speed your recovery. ? ? ?I provided 5 minutes of non face-to-face time during this encounter for chart review and documentation.  ? ?

## 2021-05-14 DIAGNOSIS — Z1211 Encounter for screening for malignant neoplasm of colon: Secondary | ICD-10-CM | POA: Diagnosis not present

## 2021-05-15 ENCOUNTER — Encounter: Payer: Self-pay | Admitting: Family Medicine

## 2021-05-15 ENCOUNTER — Other Ambulatory Visit: Payer: Self-pay | Admitting: Family Medicine

## 2021-05-15 DIAGNOSIS — J40 Bronchitis, not specified as acute or chronic: Secondary | ICD-10-CM

## 2021-05-15 MED ORDER — LEVOFLOXACIN 500 MG PO TABS: 500.0000 mg | ORAL_TABLET | Freq: Every day | ORAL | 0 refills | Status: AC

## 2021-05-20 ENCOUNTER — Ambulatory Visit (HOSPITAL_BASED_OUTPATIENT_CLINIC_OR_DEPARTMENT_OTHER)
Admission: RE | Admit: 2021-05-20 | Discharge: 2021-05-20 | Disposition: A | Discharge status: Home / Self Care | Payer: BC Managed Care – PPO | Source: Ambulatory Visit | Attending: Family Medicine | Admitting: Family Medicine

## 2021-05-20 ENCOUNTER — Encounter (HOSPITAL_BASED_OUTPATIENT_CLINIC_OR_DEPARTMENT_OTHER): Payer: Self-pay

## 2021-05-20 ENCOUNTER — Other Ambulatory Visit: Payer: Self-pay

## 2021-05-20 DIAGNOSIS — Z1231 Encounter for screening mammogram for malignant neoplasm of breast: Secondary | ICD-10-CM

## 2021-05-21 LAB — COLOGUARD: COLOGUARD: NEGATIVE

## 2021-07-01 ENCOUNTER — Other Ambulatory Visit: Payer: Self-pay | Admitting: Family Medicine

## 2021-07-01 DIAGNOSIS — F411 Generalized anxiety disorder: Secondary | ICD-10-CM

## 2021-07-01 NOTE — Telephone Encounter (Signed)
Requesting: alprazolam 0.5mg   ?Contract: 05/02/21 ?UDS: 05/02/21 ?Last Visit: 05/02/21 ?Next Visit: 10/28/21 ?Last Refill: 05/02/21 #90 and 1RF ? ?Please Advise ? ?

## 2021-07-02 ENCOUNTER — Other Ambulatory Visit: Payer: Self-pay | Admitting: Family Medicine

## 2021-07-02 DIAGNOSIS — F411 Generalized anxiety disorder: Secondary | ICD-10-CM

## 2021-07-03 ENCOUNTER — Telehealth: Payer: Self-pay | Admitting: Family Medicine

## 2021-07-03 ENCOUNTER — Telehealth: Payer: BC Managed Care – PPO | Admitting: Nurse Practitioner

## 2021-07-03 ENCOUNTER — Ambulatory Visit
Admission: EM | Admit: 2021-07-03 | Discharge: 2021-07-03 | Disposition: A | Discharge status: Home / Self Care | Payer: BC Managed Care – PPO | Attending: Family Medicine | Admitting: Family Medicine

## 2021-07-03 DIAGNOSIS — R051 Acute cough: Secondary | ICD-10-CM

## 2021-07-03 DIAGNOSIS — B9789 Other viral agents as the cause of diseases classified elsewhere: Secondary | ICD-10-CM | POA: Diagnosis not present

## 2021-07-03 DIAGNOSIS — J4541 Moderate persistent asthma with (acute) exacerbation: Secondary | ICD-10-CM

## 2021-07-03 DIAGNOSIS — J019 Acute sinusitis, unspecified: Secondary | ICD-10-CM

## 2021-07-03 DIAGNOSIS — H539 Unspecified visual disturbance: Secondary | ICD-10-CM

## 2021-07-03 MED ORDER — DOXYCYCLINE HYCLATE 100 MG PO CAPS: 100.0000 mg | ORAL_CAPSULE | Freq: Two times a day (BID) | ORAL | 0 refills | Status: AC

## 2021-07-03 MED ORDER — BENZONATATE 100 MG PO CAPS: 100.0000 mg | ORAL_CAPSULE | Freq: Three times a day (TID) | ORAL | 0 refills | Status: DC | PRN

## 2021-07-03 NOTE — ED Triage Notes (Signed)
Pt c/o cough, ear ache, intermittent nasal congestion, headache, nausea, body aches  ? ?Denies sore throat, vomiting, diarrhea, constipation, chills  ? ?Onset ~ 5 days ago has tried claritin-d for a few days without relief.  ?

## 2021-07-03 NOTE — ED Provider Notes (Signed)
?[facility] ? ? ? ?CSN: [id number] ?Arrival date & time: 07/03/21  1016 ? ? ?  ? ?History   ?Chief Complaint ?Chief Complaint  ?Patient presents with  ? Follow-up  ?  Sinus infection follow up form [facility] it on my cart and chat with my doctors office recommended me to come here - Entered by patient  ? sinus situation  ? ? ?HPI ?Deborah Watts is a 56 y.o. female.  ? ?HPI ?Patient with a history of sinusitis, asthma, presents today with a 5-day history of worsening sinus congestion, cough which is occasionally productive, fatigue, and headache.  Today she developed nausea and body aches.  Patient reports she has been taking Claritin-D which has not improved any of her upper respiratory symptoms.  She reports a history of difficult to treat sinusitis and would like to avoid azithromycin as this typically does not resolve her sinus infections.  She is afebrile.  Unaware of any known sick contacts. ? ?Past Medical History:  ?Diagnosis Date  ? Anxiety   ? Arthritis   ? Asthma   ? Headache   ? migraines  ? History of hiatal hernia   ? repaired 30 yrs ago in [facility]  ? Hypertension   ? MHA (microangiopathic hemolytic anemia) (HCC)   ? ? ?Patient Active Problem List  ? Diagnosis Date Noted  ? Asthma, chronic, unspecified asthma severity, uncomplicated 0000000  ? Loud snoring 11/28/2019  ? Daytime sleepiness 11/28/2019  ? Other fatigue 11/28/2019  ? Primary osteoarthritis of left knee 08/02/2019  ? Rib pain 10/19/2018  ? Right knee pain 04/12/2017  ? Preventative health care 03/25/2016  ? Left ankle pain 01/14/2016  ? Sinusitis, acute 12/22/2014  ? Left leg pain 04/28/2014  ? Right calf pain 04/20/2014  ? Acute bacterial sinusitis 04/14/2014  ? Obesity (BMI 30-39.9) 01/06/2013  ? Influenza 03/10/2011  ? Thrush, oral 03/04/2011  ? TINEA CRURIS 02/05/2010  ? HYPOKALEMIA 04/26/2009  ? LOW BACK PAIN SYNDROME 05/26/2007  ? LEG CRAMPS 05/14/2007  ? EDEMA 05/14/2007  ? Chronic migraine without aura, with status  migrainosus 05/01/2007  ? ASTHMATIC BRONCHITIS, ACUTE 10/15/2006  ? Anxiety state 04/20/2006  ? Asthma with acute exacerbation 04/20/2006  ? RESTLESS LEG SYNDROME, HX OF 04/20/2006  ? ? ?Past Surgical History:  ?Procedure Laterality Date  ? APPENDECTOMY N/A   ? LAPAROSCOPIC CHOLECYSTECTOMY N/A 03/1992  ? 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 [facility]  ? REPLACEMENT TOTAL KNEE Right 01/15/2021  ? [facility]  ? TOTAL ABDOMINAL HYSTERECTOMY Right   ? TUBAL LIGATION    ? WRIST ARTHROSCOPY Right 02/12/2018  ? Procedure: Right wrist arthroscopy, evaluation under anesthesia with debridement and repair as necessary and right carpal tunnel release;  Surgeon: Roseanne Kaufman, MD;  Location: [facility];  Service: Orthopedics;  Laterality: Right;  90 mins  ? ? ?OB History   ?No obstetric history on file. ?  ? ? ? ?Home Medications   ? ?Prior to Admission medications   ?Medication Sig Start Date End Date Taking? Authorizing Provider  ?doxycycline (VIBRAMYCIN) 100 MG capsule Take 1 capsule (100 mg total) by mouth 2 (two) times daily for 7 days. 07/03/21 07/10/21 Yes Scot Jun, FNP  ?albuterol (VENTOLIN HFA) 108 (90 Base) MCG/ACT inhaler INHALE 2 PUFFS BY MOUTH EVERY 6 HOURS FOR WHEEZING FOR SHORTNESS OF BREATH 05/02/21   Ann Held, DO  ?ALPRAZolam (XANAX) 0.5 MG tablet TAKE ONE TABLET BY MOUTH THREE TIMES DAILY AS NEEDED 07/01/21  Carollee Herter, Kendrick Fries R, DO  ?benzonatate (TESSALON) 100 MG capsule Take 1 capsule (100 mg total) by mouth 3 (three) times daily as needed. 07/03/21   Scot Jun, FNP  ?Boswellia Serrata (BOSWELLIA PO) Take 1 capsule by mouth daily.    [provider]  ?Adair Patter 100-25 MCG/ACT AEPB Inhale 1 puff into the lungs daily. 05/13/21   Colon Branch, MD  ?diclofenac (VOLTAREN) 75 MG EC tablet Take 1 tablet (75 mg total) by mouth 2 (two) times daily. 05/02/21   Ann Held, DO  ?escitalopram (LEXAPRO) 20 MG tablet Take 1 tablet (20 mg total) by mouth daily. 05/02/21   Roma Schanz R, DO  ?ipratropium (ATROVENT) 0.03 % nasal spray Place 2 sprays into both nostrils every 12 (twelve) hours. 03/25/21   Mar Daring, PA-C  ?Meclizine HCl (TRAVEL SICKNESS) 25 MG CHEW CHEW 1 TABLET EVERY 6 HOURS AS NEEDED 12/13/20   Carollee Herter, Alferd Apa, DO  ?montelukast (SINGULAIR) 10 MG tablet Take 1 tablet (10 mg total) by mouth at bedtime. 05/02/21   Ann Held, DO  ?nystatin (MYCOSTATIN) 100000 UNIT/ML suspension Take 5 mLs (500,000 Units total) by mouth 4 (four) times daily. 01/27/19   Ann Held, DO  ?oxyCODONE (ROXICODONE) 5 MG immediate release tablet Take 1 tablet (5 mg total) by mouth every 6 (six) hours as needed for up to 12 doses for severe pain. 09/21/20   Wyvonnia Dusky, MD  ?predniSONE (DELTASONE) 20 MG tablet Take 2 tablets (40 mg total) by mouth daily with breakfast. 05/07/21   Mar Daring, PA-C  ?SUMAtriptan (IMITREX) 100 MG tablet TAKE ONE TABLET BY MOUTH AS NEEDED FOR MIGRAINE. REPEAT IN 2 HOURS IF NEEDED. 05/02/21   Carollee Herter, Alferd Apa, DO  ?topiramate (TOPAMAX) 50 MG tablet Take 1 tablet (50 mg total) by mouth 2 (two) times daily. 05/02/21   Ann Held, DO  ?traMADol (ULTRAM) 50 MG tablet Take 1 tablet (50 mg total) by mouth every 8 (eight) hours as needed. 01/31/21   Ann Held, DO  ?trimethoprim-polymyxin b (POLYTRIM) ophthalmic solution Place 1 drop into both eyes every 4 (four) hours. 05/07/21   Mar Daring, PA-C  ?TURMERIC PO Take 1,000 mg by mouth daily.    [provider]  ? ? ?Family History ?Family History  ?Problem Relation Age of Onset  ? Throat cancer Father   ? Kidney failure Father   ? Asthma Other   ? Diabetes Other   ? Hyperlipidemia Other   ? ? ?Social History ?Social History  ? ?Tobacco Use  ? Smoking status: Never  ? Smokeless tobacco: Never  ?Vaping Use  ? Vaping Use: Never used  ?Substance Use Topics  ? Alcohol use: Yes  ?  Alcohol/week: 0.0 standard drinks  ?  Comment: occassional  ?  Drug use: No  ? ? ? ?Allergies   ?Fish allergy, Penicillins, Iohexol, Ivp dye [iodinated contrast media], and Morphine and related ? ? ?Review of Systems ?Review of Systems ?Pertinent negatives listed in HPI  ?Physical Exam ?Triage Vital Signs ?ED Triage Vitals  ?Enc Vitals Group  ?   BP 07/03/21 1059 133/70  ?   Pulse Rate 07/03/21 1059 80  ?   Resp 07/03/21 1059 18  ?   Temp 07/03/21 1059 97.9 ?F (36.6 ?C)  ?   Temp Source 07/03/21 1059 Oral  ?   SpO2 07/03/21 1059 97 %  ?  Weight --   ?   Height --   ?   Head Circumference --   ?   Peak Flow --   ?   Pain Score 07/03/21 1100 0  ?   Pain Loc --   ?   Pain Edu? --   ?   Excl. in [place]? --   ? ?No data found. ? ?Updated Vital Signs ?BP 133/70 (BP Location: Left Arm)   Pulse 80   Temp 97.9 ?F (36.6 ?C) (Oral)   Resp 18   SpO2 97%  ? ?Visual Acuity ?Right Eye Distance:   ?Left Eye Distance:   ?Bilateral Distance:   ? ?Right Eye Near:   ?Left Eye Near:    ?Bilateral Near:    ? ?Physical Exam ? ?General Appearance:    Alert, cooperative, no distress  ?HENT:   Normocephalic, ears normal, nares mucosal edema with congestion, rhinorrhea, oropharynx patent without exudate or erythema  ?Eyes:    PERRL, conjunctiva/corneas clear, EOM's intact       ?Lungs:     Clear to auscultation bilaterally, respirations unlabored  ?Heart:    Regular rate and rhythm  ?Neurologic:   Awake, alert, oriented x 3. No apparent focal neurological           defect.   ?  ? ?UC Treatments / Results  ?Labs ?(all labs ordered are listed, but only abnormal results are displayed) ?Labs Reviewed - No data to display ? ?EKG ? ? ?Radiology ?No results found. ? ?Procedures ?Procedures (including critical care time) ? ?Medications Ordered in UC ?Medications - No data to display ? ?Initial Impression / Assessment and Plan / UC Course  ?I have reviewed the triage vital signs and the nursing notes. ? ?Pertinent labs & imaging results that were available during my care of the patient were reviewed by me and  considered in my medical decision making (see chart for details). ? ?  ?Acute sinusitis treatment today with doxycycline twice daily for 7 days.  Tessalon Perles as needed for cough.  Hydrate well with fluids.  Rest.

## 2021-07-03 NOTE — Telephone Encounter (Signed)
Pt called stating she wanted to schedule an appt w/ Dr. Laury Axon. She had mentioned that she had been triaged and they had recommended her to be seen by urgent care for her worsening symptoms. After advising her about why it would be better to see urgent care vs. Her PCP, she was offered to have someone reach out on her behalf to see how long it would be for her to see someone with [facility]. She agreed and that urgent care was called to confirm some information regarding wait times.  ? ?After confirming wait times and appt availablility, called the pt back to relay that information to her so that she could be seen for her symptoms. Pt stated she would call them to set up an appt so she could be seen. ?

## 2021-07-03 NOTE — Progress Notes (Signed)
Deborah Watts, ? ?Your visual changes are concerning and we would prefer you are evaluated in person today. I apologize for this inconvenience but we want to assure you are getting the best care possible.  ? ?Based on what you shared with me, I feel your condition warrants further evaluation and I recommend that you be seen in a face to face visit. ?  ?NOTE: There will be NO CHARGE for this eVisit ?  ?If you are having a true medical emergency please call 911.   ?  ? For an urgent face to face visit, [facility] has six urgent care centers for your convenience:  ?  ? [facility] [place] [facility] ?Get Driving Directions ?[phone] ?[address] ?[place], [place] [address] ?  ? [facility]) ?Get Driving Directions ?[phone] ?[address] ?[place], [place] [address] ? ?[facility] [place] [facility]) ?Get Driving Directions ?[place],  [place]  [address] ? ?[facility] ?Get Driving Directions ?[phone] ?[address], [address] ?[place], [place] [address] ?  ?[facility] ?Get Driving Directions  ?[phone] ?[address] ?[address] ?[place], [place] [address] ?  ?[facility] ?Get Driving Directions ?[phone] ?[address] ?[place],  [address] ? ?Your MyChart E-visit questionnaire answers were reviewed by a board certified advanced clinical practitioner to complete your personal care plan based on your specific symptoms.  Thank you for using e-Visits. ? ? ?

## 2021-07-08 DIAGNOSIS — M5432 Sciatica, left side: Secondary | ICD-10-CM | POA: Diagnosis not present

## 2021-07-08 DIAGNOSIS — M79605 Pain in left leg: Secondary | ICD-10-CM | POA: Diagnosis not present

## 2021-07-18 DIAGNOSIS — M4807 Spinal stenosis, lumbosacral region: Secondary | ICD-10-CM | POA: Diagnosis not present

## 2021-07-18 DIAGNOSIS — M4727 Other spondylosis with radiculopathy, lumbosacral region: Secondary | ICD-10-CM | POA: Diagnosis not present

## 2021-07-18 DIAGNOSIS — M5117 Intervertebral disc disorders with radiculopathy, lumbosacral region: Secondary | ICD-10-CM | POA: Diagnosis not present

## 2021-07-18 DIAGNOSIS — Z9889 Other specified postprocedural states: Secondary | ICD-10-CM | POA: Diagnosis not present

## 2021-07-24 ENCOUNTER — Telehealth: Payer: BC Managed Care – PPO | Admitting: Physician Assistant

## 2021-07-24 DIAGNOSIS — J208 Acute bronchitis due to other specified organisms: Secondary | ICD-10-CM | POA: Diagnosis not present

## 2021-07-24 MED ORDER — BENZONATATE 100 MG PO CAPS: 100.0000 mg | ORAL_CAPSULE | Freq: Three times a day (TID) | ORAL | 0 refills | Status: DC | PRN

## 2021-07-24 MED ORDER — PREDNISONE 20 MG PO TABS: 40.0000 mg | ORAL_TABLET | Freq: Every day | ORAL | 0 refills | Status: AC

## 2021-07-24 NOTE — Progress Notes (Signed)
I have spent 5 minutes in review of e-visit questionnaire, review and updating patient chart, medical decision making and response to patient.   Therisa Mennella Cody Iyanah Demont, PA-C    

## 2021-07-24 NOTE — Progress Notes (Signed)
We are sorry that you are not feeling well.  Here is how we plan to help! ? ?Based on your presentation I believe you most likely have A cough due to a virus.  This is called viral bronchitis and is best treated by rest, plenty of fluids and control of the cough.  You may use Ibuprofen or Tylenol as directed to help your symptoms.   ?  ?In addition you may use A prescription cough medication called Tessalon Perles 100mg . You may take 1-2 capsules every 8 hours as needed for your cough. ?Giving asthma exacerbation with this I have sent in prednisone 40 mg daily for 5 days.  ? ?From your responses in the eVisit questionnaire you describe inflammation in the upper respiratory tract which is causing a significant cough.  This is commonly called Bronchitis and has four common causes:   ?Allergies ?Viral Infections ?Acid Reflux ?Bacterial Infection ?Allergies, viruses and acid reflux are treated by controlling symptoms or eliminating the cause. An example might be a cough caused by taking certain blood pressure medications. You stop the cough by changing the medication. Another example might be a cough caused by acid reflux. Controlling the reflux helps control the cough. ? ?USE OF BRONCHODILATOR ("RESCUE") INHALERS: ?There is a risk from using your bronchodilator too frequently.  The risk is that over-reliance on a medication which only relaxes the muscles surrounding the breathing tubes can reduce the effectiveness of medications prescribed to reduce swelling and congestion of the tubes themselves.  Although you feel brief relief from the bronchodilator inhaler, your asthma may actually be worsening with the tubes becoming more swollen and filled with mucus.  This can delay other crucial treatments, such as oral steroid medications. If you need to use a bronchodilator inhaler daily, several times per day, you should discuss this with your provider.  There are probably better treatments that could be used to keep your  asthma under control.  ?   ?HOME CARE ?Only take medications as instructed by your medical team. ?Complete the entire course of an antibiotic. ?Drink plenty of fluids and get plenty of rest. ?Avoid close contacts especially the very young and the elderly ?Cover your mouth if you cough or cough into your sleeve. ?Always remember to wash your hands ?A steam or ultrasonic humidifier can help congestion.  ? ?GET HELP RIGHT AWAY IF: ?You develop worsening fever. ?You become short of breath ?You cough up blood. ?Your symptoms persist after you have completed your treatment plan ?MAKE SURE YOU  ?Understand these instructions. ?Will watch your condition. ?Will get help right away if you are not doing well or get worse. ?  ? ?Thank you for choosing an e-visit. ? ?Your e-visit answers were reviewed by a board certified advanced clinical practitioner to complete your personal care plan. Depending upon the condition, your plan could have included both over the counter or prescription medications. ? ?Please review your pharmacy choice. Make sure the pharmacy is open so you can pick up prescription now. If there is a problem, you may contact your provider through and have the prescription routed to another pharmacy.  Your safety is important to [employer]. If you have drug allergies check your prescription carefully.  ? ?For the next 24 hours you can use MyChart to ask questions about today's visit, request a non-urgent call back, or ask for a work or school excuse. ?You will get an email in the next two days asking about your experience. I hope  that your e-visit has been valuable and will speed your recovery. ? ?

## 2021-08-07 DIAGNOSIS — M4726 Other spondylosis with radiculopathy, lumbar region: Secondary | ICD-10-CM | POA: Diagnosis not present

## 2021-08-07 DIAGNOSIS — M47816 Spondylosis without myelopathy or radiculopathy, lumbar region: Secondary | ICD-10-CM | POA: Diagnosis not present

## 2021-08-07 DIAGNOSIS — M48061 Spinal stenosis, lumbar region without neurogenic claudication: Secondary | ICD-10-CM | POA: Diagnosis not present

## 2021-08-07 DIAGNOSIS — M5136 Other intervertebral disc degeneration, lumbar region: Secondary | ICD-10-CM | POA: Diagnosis not present

## 2021-08-28 ENCOUNTER — Other Ambulatory Visit: Payer: Self-pay | Admitting: *Deleted

## 2021-08-28 DIAGNOSIS — F411 Generalized anxiety disorder: Secondary | ICD-10-CM

## 2021-08-28 MED ORDER — ALPRAZOLAM 0.5 MG PO TABS: 0.5000 mg | ORAL_TABLET | Freq: Three times a day (TID) | ORAL | 1 refills | Status: DC | PRN

## 2021-08-28 NOTE — Telephone Encounter (Signed)
Requesting: alprazolam Contract: 05/02/21 UDS:  05/02/21 Last Visit: 05/02/21 Next Visit: 10/28/21 Last Refill: 07/01/21  Please Advise

## 2021-08-29 ENCOUNTER — Emergency Department (HOSPITAL_BASED_OUTPATIENT_CLINIC_OR_DEPARTMENT_OTHER): Payer: BC Managed Care – PPO

## 2021-08-29 ENCOUNTER — Emergency Department (HOSPITAL_BASED_OUTPATIENT_CLINIC_OR_DEPARTMENT_OTHER)
Admission: EM | Admit: 2021-08-29 | Discharge: 2021-08-29 | Disposition: A | Discharge status: Home / Self Care | Payer: BC Managed Care – PPO | Attending: Emergency Medicine | Admitting: Emergency Medicine

## 2021-08-29 ENCOUNTER — Encounter (HOSPITAL_BASED_OUTPATIENT_CLINIC_OR_DEPARTMENT_OTHER): Payer: Self-pay

## 2021-08-29 ENCOUNTER — Other Ambulatory Visit: Payer: Self-pay

## 2021-08-29 DIAGNOSIS — M7989 Other specified soft tissue disorders: Secondary | ICD-10-CM | POA: Diagnosis not present

## 2021-08-29 DIAGNOSIS — R0981 Nasal congestion: Secondary | ICD-10-CM | POA: Diagnosis not present

## 2021-08-29 DIAGNOSIS — R21 Rash and other nonspecific skin eruption: Secondary | ICD-10-CM | POA: Insufficient documentation

## 2021-08-29 DIAGNOSIS — R0602 Shortness of breath: Secondary | ICD-10-CM | POA: Diagnosis not present

## 2021-08-29 DIAGNOSIS — R059 Cough, unspecified: Secondary | ICD-10-CM | POA: Insufficient documentation

## 2021-08-29 LAB — BASIC METABOLIC PANEL
Anion gap: 7 (ref 5–15)
BUN: 14 mg/dL (ref 6–20)
CO2: 25 mmol/L (ref 22–32)
Calcium: 9.2 mg/dL (ref 8.9–10.3)
Chloride: 109 mmol/L (ref 98–111)
Creatinine, Ser: 0.79 mg/dL (ref 0.44–1.00)
GFR, Estimated: >60 mL/min (ref >60)
Glucose, Bld: 96 mg/dL (ref 70–99)
Potassium: 3.9 mmol/L (ref 3.5–5.1)
Sodium: 141 mmol/L (ref 135–145)

## 2021-08-29 LAB — CBC
HCT: 42.2 % (ref 36.0–46.0)
Hemoglobin: 13.8 g/dL (ref 12.0–15.0)
MCH: 28.3 pg (ref 26.0–34.0)
MCHC: 32.7 g/dL (ref 30.0–36.0)
MCV: 86.5 fL (ref 80.0–100.0)
Platelets: 368 10*3/uL (ref 150–400)
RBC: 4.88 MIL/uL (ref 3.87–5.11)
RDW: 14.8 % (ref 11.5–15.5)
WBC: 11.2 10*3/uL — ABNORMAL HIGH (ref 4.0–10.5)
nRBC: 0 % (ref 0.0–0.2)

## 2021-08-29 LAB — BRAIN NATRIURETIC PEPTIDE: B Natriuretic Peptide: 22.5 pg/mL (ref 0.0–100.0)

## 2021-08-29 LAB — TROPONIN I (HIGH SENSITIVITY)
Troponin I (High Sensitivity): 3 ng/L (ref <18)
Troponin I (High Sensitivity): 3 ng/L (ref <18)

## 2021-08-29 MED ORDER — TRAMADOL HCL 50 MG PO TABS
50.0000 mg | ORAL_TABLET | Freq: Once | ORAL | Status: AC
  Administered 2021-08-29: 50 mg via ORAL
  Filled 2021-08-29: qty 1

## 2021-08-29 NOTE — ED Provider Notes (Signed)
[facility] EMERGENCY DEPARTMENT Provider Note   CSN: [id number] Arrival date & time: 08/29/21  1118     History  Chief Complaint  Patient presents with   Shortness of Breath    Deborah Watts is a 56 y.o. female.   Shortness of Breath  Patient is a 56 year old female presented to the emergency room today with complaints of some shortness of breath that she noticed this morning.  She states that she is not having any chest pain some mild nausea which is now resolved no vomiting and denies any lightheadedness or dizziness.  She states that she just feels that she has to take deeper breaths.  She does not feel that she is wheezing or gasping for air.  She states that she has occasionally been coughing more often and she states that she struggles with sinus congestion this time of year currently taking Claritin and using Flonase.  She denies any recent hospitalization or any cancer history.  She states that she feels that her right leg has been swollen over the past few days.  She denies any trauma to her leg denies any redness or skin changes.  She has not taken any medications for her symptoms other than her prescribed medications.  She has a small rash on her left abdomen that she is worried is a result of a reaction to the gabapentin that she started 1 month ago.  She also switched to a new muscle relaxer a few weeks ago.      Home Medications Prior to Admission medications   Medication Sig Start Date End Date Taking? Authorizing Provider  albuterol (VENTOLIN HFA) 108 (90 Base) MCG/ACT inhaler INHALE 2 PUFFS BY MOUTH EVERY 6 HOURS FOR WHEEZING FOR SHORTNESS OF BREATH 05/02/21   Zola Button, Grayling Congress, DO  ALPRAZolam Prudy Feeler) 0.5 MG tablet Take 1 tablet (0.5 mg total) by mouth 3 (three) times daily as needed. 08/28/21   Donato Schultz, DO  benzonatate (TESSALON) 100 MG capsule Take 1 capsule (100 mg total) by mouth 3 (three) times daily as needed for cough. 07/24/21    Waldon Merl, PA-C  Boswellia Serrata (BOSWELLIA PO) Take 1 capsule by mouth daily.    [provider]  BREO ELLIPTA 100-25 MCG/ACT AEPB Inhale 1 puff into the lungs daily. 05/13/21   Wanda Plump, MD  diclofenac (VOLTAREN) 75 MG EC tablet Take 1 tablet (75 mg total) by mouth 2 (two) times daily. 05/02/21   Seabron Spates R, DO  escitalopram (LEXAPRO) 20 MG tablet Take 1 tablet (20 mg total) by mouth daily. 05/02/21   Seabron Spates R, DO  ipratropium (ATROVENT) 0.03 % nasal spray Place 2 sprays into both nostrils every 12 (twelve) hours. 03/25/21   Margaretann Loveless, PA-C  Meclizine HCl (TRAVEL SICKNESS) 25 MG CHEW CHEW 1 TABLET EVERY 6 HOURS AS NEEDED 12/13/20   Zola Button, Grayling Congress, DO  montelukast (SINGULAIR) 10 MG tablet Take 1 tablet (10 mg total) by mouth at bedtime. 05/02/21   Donato Schultz, DO  nystatin (MYCOSTATIN) 100000 UNIT/ML suspension Take 5 mLs (500,000 Units total) by mouth 4 (four) times daily. 01/27/19   Seabron Spates R, DO  SUMAtriptan (IMITREX) 100 MG tablet TAKE ONE TABLET BY MOUTH AS NEEDED FOR MIGRAINE. REPEAT IN 2 HOURS IF NEEDED. 05/02/21   Zola Button, Grayling Congress, DO  topiramate (TOPAMAX) 50 MG tablet Take 1 tablet (50 mg total) by mouth 2 (two) times daily.  05/02/21   Donato Schultz, DO  traMADol (ULTRAM) 50 MG tablet Take 1 tablet (50 mg total) by mouth every 8 (eight) hours as needed. 01/31/21   Donato Schultz, DO  trimethoprim-polymyxin b (POLYTRIM) ophthalmic solution Place 1 drop into both eyes every 4 (four) hours. 05/07/21   Margaretann Loveless, PA-C  TURMERIC PO Take 1,000 mg by mouth daily.    [provider]      Allergies    Fish allergy, Penicillins, Iohexol, Ivp dye [iodinated contrast media], and Morphine and related    Review of Systems   Review of Systems  Respiratory:  Positive for shortness of breath.     Physical Exam Updated Vital Signs BP 121/68   Pulse 66   Temp 98.3 F (36.8 C) (Oral)    Resp 20   Ht 4\' 11"  (1.499 m)   Wt 99.3 kg   SpO2 98%   BMI 44.23 kg/m  Physical Exam Vitals and nursing note reviewed.  Constitutional:      General: She is not in acute distress.    Appearance: She is obese.     Comments: Pleasant morbidly obese 56 year old female in no acute distress speaking full sentences able answer questions appropriate follow commands.  HENT:     Head: Normocephalic and atraumatic.     Nose: Nose normal.  Eyes:     General: No scleral icterus. Cardiovascular:     Rate and Rhythm: Normal rate and regular rhythm.     Pulses: Normal pulses.     Heart sounds: Normal heart sounds.  Pulmonary:     Effort: Pulmonary effort is normal. No respiratory distress.     Breath sounds: Normal breath sounds. No wheezing.  Abdominal:     Palpations: Abdomen is soft.     Tenderness: There is no abdominal tenderness. There is no guarding or rebound.  Musculoskeletal:     Cervical back: Normal range of motion.     Left lower leg: No edema.     Comments: Slight, barely perceptible swelling of her right lower extremity in the calf.  Not particularly tender to touch.  No cellulitic changes.  Skin:    General: Skin is warm and dry.     Capillary Refill: Capillary refill takes less than 2 seconds.  Neurological:     Mental Status: She is alert. Mental status is at baseline.  Psychiatric:        Mood and Affect: Mood normal.        Behavior: Behavior normal.     ED Results / Procedures / Treatments   Labs (all labs ordered are listed, but only abnormal results are displayed) Labs Reviewed  CBC - Abnormal; Notable for the following components:      Result Value   WBC 11.2 (*)    All other components within normal limits  BASIC METABOLIC PANEL  BRAIN NATRIURETIC PEPTIDE  TROPONIN I (HIGH SENSITIVITY)  TROPONIN I (HIGH SENSITIVITY)    EKG None  Radiology 58 Venous Img Lower Right (DVT Study)  Result Date: 08/29/2021 CLINICAL DATA:  Right leg swelling. EXAM:  RIGHT LOWER EXTREMITY VENOUS DOPPLER ULTRASOUND TECHNIQUE: Gray-scale sonography with compression, as well as color and duplex ultrasound, were performed to evaluate the deep venous system(s) from the level of the common femoral vein through the popliteal and proximal calf veins. COMPARISON:  None Available. FINDINGS: VENOUS Normal compressibility of the common femoral, superficial femoral, and popliteal veins, as well as the visualized calf veins.  Visualized portions of profunda femoral vein and great saphenous vein unremarkable. No filling defects to suggest DVT on grayscale or color Doppler imaging. Doppler waveforms show normal direction of venous flow, normal respiratory plasticity and response to augmentation. Limited views of the contralateral common femoral vein are unremarkable. OTHER None. Limitations: none IMPRESSION: Negative. Electronically Signed   By: Logan Bores M.D.   On: 08/29/2021 16:11   DG Chest 2 View  Result Date: 08/29/2021 CLINICAL DATA:  Provided history: Shortness of breath. EXAM: CHEST - 2 VIEW COMPARISON:  Prior chest radiographs 10/19/2018 and earlier. FINDINGS: Heart size within normal limits. No appreciable airspace consolidation. No evidence of pleural effusion or pneumothorax. No acute bony abnormality identified. IMPRESSION: No evidence of active cardiopulmonary disease. Electronically Signed   By: Kellie Simmering D.O.   On: 08/29/2021 11:58    Procedures Procedures    Medications Ordered in ED Medications  traMADol (ULTRAM) tablet 50 mg (50 mg Oral Given 08/29/21 1535)    ED Course/ Medical Decision Making/ A&P                           Medical Decision Making Amount and/or Complexity of Data Reviewed Labs: ordered. Radiology: ordered.  Risk Prescription drug management.   This patient presents to the ED for concern of shortness of breath, this involves a number of treatment options, and is a complaint that carries with it a moderate to high risk of  complications and morbidity.  The differential diagnosis includes   The causes for shortness of breath include but are not limited to Cardiac (AHF, pericardial effusion and tamponade, arrhythmias, ischemia, etc) Respiratory (COPD, asthma, pneumonia, pneumothorax, primary pulmonary hypertension, PE/VQ mismatch) Hematological (anemia) Neuromuscular (ALS, Guillain-Barr, etc)    Co morbidities: Discussed in HPI   Brief History:  Patient is a 56 year old female presented to the emergency room today with complaints of some shortness of breath that she noticed this morning.  She states that she is not having any chest pain some mild nausea which is now resolved no vomiting and denies any lightheadedness or dizziness.  She states that she just feels that she has to take deeper breaths.  She does not feel that she is wheezing or gasping for air.  She states that she has occasionally been coughing more often and she states that she struggles with sinus congestion this time of year currently taking Claritin and using Flonase.  She denies any recent hospitalization or any cancer history.  She states that she feels that her right leg has been swollen over the past few days.  She denies any trauma to her leg denies any redness or skin changes.  She has not taken any medications for her symptoms other than her prescribed medications.  She has a small rash on her left abdomen that she is worried is a result of a reaction to the gabapentin that she started 1 month ago.  She also switched to a new muscle relaxer a few weeks ago.   Physical exam without any significant abnormalities apart from some perhaps very mild right leg swelling.  We will obtain right lower extremity ultrasound to rule out DVT.   EMR reviewed including pt PMHx, past surgical history and past visits to ER.   See HPI for more details   Lab Tests:   I ordered and independently interpreted labs. Labs notable for very mild  leukocytosis no anemia.  BMP unremarkable.  Troponin x1 within  normal limits.  Second troponin and BNP pending at this time 4:07 PM   Second troponin is normal, BNP 22.5   Imaging Studies: DVT study IMPRESSION:  Negative.    IMPRESSION:  No evidence of active cardiopulmonary disease.      Electronically Signed    By: Kellie Simmering D.O.    On: 08/29/2021 11:58      Cardiac Monitoring:  The patient was maintained on a cardiac monitor.  I personally viewed and interpreted the cardiac monitored which showed an underlying rhythm of: NSR EKG non-ischemic   Medicines ordered:  I ordered medication including tramadol  for pain Reevaluation of the patient after these medicines showed that the patient improved I have reviewed the patients home medicines and have made adjustments as needed   Critical Interventions:     Consults/Attending Physician      Reevaluation:  After the interventions noted above I re-evaluated patient and found that they have :improved   Social Determinants of Health:      Problem List / ED Course:  Shortness of breath.  Patient had a reassuring work-up here has vital signs that are within normal limits.  No hypoxia no tachycardia and she is well-appearing on exam.  Does have some right lower extremity swelling without any evidence of cellulitis.  Low suspicion for DVT however did obtain ultrasound to confirm no DVT.  This was without clot.  Dispostion:  After consideration of the diagnostic results and the patients response to treatment, I feel that the patent would benefit from outpatient follow-up.  I recommend holding off on gabapentin if she believes that this is affecting her breathing however her symptoms have been only today and her gabapentin was started 1 month ago.  Return precautions discussed with patient.  Specifically doubt PE and with a normal BNP even in setting of obesity I have low suspicion for significant heart failure  and she has no lower extremity edema that I can appreciate.  All questions answered best my ability.  Patient ambulatory at this time.  Will discharge home with follow-up with PCP.  Return precautions discussed  Final Clinical Impression(s) / ED Diagnoses Final diagnoses:  Shortness of breath    Rx / DC Orders ED Discharge Orders     None         Tedd Sias, [place] 08/29/21 [place], [place], DO 08/31/21 0125

## 2021-08-29 NOTE — ED Triage Notes (Addendum)
Sent here by spinal doctor, they believe shes having a reaction to her new meds. Recently increased dose of gabapentin and changed muscle relaxer a few weeks ago. SOB since this morning. NAD during triage.   Adds she has noticed her right leg swelling

## 2021-08-29 NOTE — Discharge Instructions (Addendum)
Please follow-up with your primary care doctor and return to the emergency room for any new or concerning symptoms.  Your ultrasound was without any evidence of a clot in your leg.  Please continue taking all your home medications.  I do recommend elevating your legs and taking regular deep breaths in order to make sure you are expanding your lungs completely.  Hydrate well  I recommend taking 1000 mg of Tylenol every 6 hours for discomfort.  I wonder if there is some degree of arthritic pain causing some of your lower extremity pain.

## 2021-08-29 NOTE — ED Notes (Signed)
Discharge instructions reviewed with patient. Patient verbalizes understanding, no further questions at this time. Medications/prescriptions and follow up information provided. No acute distress noted at time of departure.  

## 2021-08-30 ENCOUNTER — Encounter: Payer: Self-pay | Admitting: Family Medicine

## 2021-09-02 DIAGNOSIS — M5451 Vertebrogenic low back pain: Secondary | ICD-10-CM | POA: Diagnosis not present

## 2021-09-02 DIAGNOSIS — M4726 Other spondylosis with radiculopathy, lumbar region: Secondary | ICD-10-CM | POA: Diagnosis not present

## 2021-09-04 DIAGNOSIS — M5451 Vertebrogenic low back pain: Secondary | ICD-10-CM | POA: Diagnosis not present

## 2021-09-04 DIAGNOSIS — M4726 Other spondylosis with radiculopathy, lumbar region: Secondary | ICD-10-CM | POA: Diagnosis not present

## 2021-09-09 DIAGNOSIS — M5451 Vertebrogenic low back pain: Secondary | ICD-10-CM | POA: Diagnosis not present

## 2021-09-09 DIAGNOSIS — M4726 Other spondylosis with radiculopathy, lumbar region: Secondary | ICD-10-CM | POA: Diagnosis not present

## 2021-09-11 DIAGNOSIS — M4726 Other spondylosis with radiculopathy, lumbar region: Secondary | ICD-10-CM | POA: Diagnosis not present

## 2021-09-11 DIAGNOSIS — M5451 Vertebrogenic low back pain: Secondary | ICD-10-CM | POA: Diagnosis not present

## 2021-09-12 ENCOUNTER — Other Ambulatory Visit: Payer: Self-pay | Admitting: Family Medicine

## 2021-09-12 ENCOUNTER — Encounter: Payer: Self-pay | Admitting: Family Medicine

## 2021-09-12 ENCOUNTER — Other Ambulatory Visit: Payer: Self-pay

## 2021-09-12 DIAGNOSIS — H8110 Benign paroxysmal vertigo, unspecified ear: Secondary | ICD-10-CM

## 2021-09-12 DIAGNOSIS — R109 Unspecified abdominal pain: Secondary | ICD-10-CM

## 2021-09-12 MED ORDER — TRAMADOL HCL 50 MG PO TABS: 50.0000 mg | ORAL_TABLET | Freq: Three times a day (TID) | ORAL | 0 refills | Status: DC | PRN

## 2021-09-12 MED ORDER — MECLIZINE HCL 25 MG PO CHEW: CHEWABLE_TABLET | ORAL | 1 refills | Status: DC

## 2021-09-12 NOTE — Telephone Encounter (Signed)
Refilled already

## 2021-09-16 DIAGNOSIS — M5136 Other intervertebral disc degeneration, lumbar region: Secondary | ICD-10-CM | POA: Diagnosis not present

## 2021-09-16 DIAGNOSIS — M4726 Other spondylosis with radiculopathy, lumbar region: Secondary | ICD-10-CM | POA: Diagnosis not present

## 2021-09-16 DIAGNOSIS — M47816 Spondylosis without myelopathy or radiculopathy, lumbar region: Secondary | ICD-10-CM | POA: Diagnosis not present

## 2021-09-16 DIAGNOSIS — M48061 Spinal stenosis, lumbar region without neurogenic claudication: Secondary | ICD-10-CM | POA: Diagnosis not present

## 2021-09-16 DIAGNOSIS — M25552 Pain in left hip: Secondary | ICD-10-CM | POA: Diagnosis not present

## 2021-09-25 DIAGNOSIS — M5451 Vertebrogenic low back pain: Secondary | ICD-10-CM | POA: Diagnosis not present

## 2021-09-25 DIAGNOSIS — M4726 Other spondylosis with radiculopathy, lumbar region: Secondary | ICD-10-CM | POA: Diagnosis not present

## 2021-10-02 DIAGNOSIS — M4726 Other spondylosis with radiculopathy, lumbar region: Secondary | ICD-10-CM | POA: Diagnosis not present

## 2021-10-02 DIAGNOSIS — M5451 Vertebrogenic low back pain: Secondary | ICD-10-CM | POA: Diagnosis not present

## 2021-10-11 ENCOUNTER — Telehealth: Payer: BC Managed Care – PPO | Admitting: Physician Assistant

## 2021-10-11 DIAGNOSIS — J069 Acute upper respiratory infection, unspecified: Secondary | ICD-10-CM

## 2021-10-11 MED ORDER — BENZONATATE 100 MG PO CAPS: 100.0000 mg | ORAL_CAPSULE | Freq: Three times a day (TID) | ORAL | 0 refills | Status: DC | PRN

## 2021-10-11 MED ORDER — IPRATROPIUM BROMIDE 0.03 % NA SOLN: 2.0000 | Freq: Two times a day (BID) | NASAL | 0 refills | Status: DC

## 2021-10-11 MED ORDER — PSEUDOEPH-BROMPHEN-DM 30-2-10 MG/5ML PO SYRP: 5.0000 mL | ORAL_SOLUTION | Freq: Four times a day (QID) | ORAL | 0 refills | Status: DC | PRN

## 2021-10-11 NOTE — Progress Notes (Signed)
E-Visit for Upper Respiratory Infection   We are sorry you are not feeling well.  Here is how we plan to help!  Based on what you have shared with me, it looks like you may have a viral upper respiratory infection.  Upper respiratory infections are caused by a large number of viruses; however, rhinovirus is the most common cause.   Symptoms vary from person to person, with common symptoms including sore throat, cough, fatigue or lack of energy and feeling of general discomfort.  A low-grade fever of up to 100.4 may present, but is often uncommon.  Symptoms vary however, and are closely related to a person's age or underlying illnesses.  The most common symptoms associated with an upper respiratory infection are nasal discharge or congestion, cough, sneezing, headache and pressure in the ears and face.  These symptoms usually persist for about 3 to 10 days, but can last up to 2 weeks.  It is important to know that upper respiratory infections do not cause serious illness or complications in most cases.    Upper respiratory infections can be transmitted from person to person, with the most common method of transmission being a person's hands.  The virus is able to live on the skin and can infect other persons for up to 2 hours after direct contact.  Also, these can be transmitted when someone coughs or sneezes; thus, it is important to cover the mouth to reduce this risk.  To keep the spread of the illness at bay, good hand hygiene is very important.  This is an infection that is most likely caused by a virus. There are no specific treatments other than to help you with the symptoms until the infection runs its course.  We are sorry you are not feeling well.  Here is how we plan to help!   For nasal congestion, you may use an oral decongestants such as Mucinex D or if you have glaucoma or high blood pressure use plain Mucinex.  Saline nasal spray or nasal drops can help and can safely be used as often as  needed for congestion.  For your congestion, I have prescribed Ipratropium Bromide nasal spray 0.03% two sprays in each nostril 2-3 times a day  If you do not have a history of heart disease, hypertension, diabetes or thyroid disease, prostate/bladder issues or glaucoma, you may also use Sudafed to treat nasal congestion.  It is highly recommended that you consult with a pharmacist or your primary care physician to ensure this medication is safe for you to take.     For cough I have prescribed for you A prescription cough medication called Tessalon Perles 100 mg. You may take 1-2 capsules every 8 hours as needed for cough and Bromfed DM cough syrup 42mL every 6 hours as needed for cough  If you have a sore or scratchy throat, use a saltwater gargle-  to  teaspoon of salt dissolved in a 4-ounce to 8-ounce glass of warm water.  Gargle the solution for approximately 15-30 seconds and then spit.  It is important not to swallow the solution.  You can also use throat lozenges/cough drops and Chloraseptic spray to help with throat pain or discomfort.  Warm or cold liquids can also be helpful in relieving throat pain.  For headache, pain or general discomfort, you can use Ibuprofen or Tylenol as directed.   Some authorities believe that zinc sprays or the use of Echinacea may shorten the course of your symptoms.  HOME CARE Only take medications as instructed by your medical team. Be sure to drink plenty of fluids. Water is fine as well as fruit juices, sodas and electrolyte beverages. You may want to stay away from caffeine or alcohol. If you are nauseated, try taking small sips of liquids. How do you know if you are getting enough fluid? Your urine should be a pale yellow or almost colorless. Get rest. Taking a steamy shower or using a humidifier may help nasal congestion and ease sore throat pain. You can place a towel over your head and breathe in the steam from hot water coming from a faucet. Using a  saline nasal spray works much the same way. Cough drops, hard candies and sore throat lozenges may ease your cough. Avoid close contacts especially the very young and the elderly Cover your mouth if you cough or sneeze Always remember to wash your hands.   GET HELP RIGHT AWAY IF: You develop worsening fever. If your symptoms do not improve within 10 days You develop yellow or green discharge from your nose over 3 days. You have coughing fits You develop a severe head ache or visual changes. You develop shortness of breath, difficulty breathing or start having chest pain Your symptoms persist after you have completed your treatment plan  MAKE SURE YOU  Understand these instructions. Will watch your condition. Will get help right away if you are not doing well or get worse.  Thank you for choosing an e-visit.  Your e-visit answers were reviewed by a board certified advanced clinical practitioner to complete your personal care plan. Depending upon the condition, your plan could have included both over the counter or prescription medications.  Please review your pharmacy choice. Make sure the pharmacy is open so you can pick up prescription now. If there is a problem, you may contact your provider through [employer] and have the prescription routed to another pharmacy.  Your safety is important to [place]. If you have drug allergies check your prescription carefully.   For the next 24 hours you can use MyChart to ask questions about today's visit, request a non-urgent call back, or ask for a work or school excuse. You will get an email in the next two days asking about your experience. I hope that your e-visit has been valuable and will speed your recovery.  I provided 5 minutes of non face-to-face time during this encounter for chart review and documentation.

## 2021-10-12 ENCOUNTER — Other Ambulatory Visit: Payer: Self-pay

## 2021-10-12 ENCOUNTER — Other Ambulatory Visit: Payer: Self-pay | Admitting: Family Medicine

## 2021-10-12 ENCOUNTER — Ambulatory Visit
Admission: RE | Admit: 2021-10-12 | Discharge: 2021-10-12 | Disposition: A | Discharge status: Home / Self Care | Payer: BC Managed Care – PPO | Source: Ambulatory Visit

## 2021-10-12 VITALS — BP 110/78 | HR 79 | Temp 97.9°F | Resp 18

## 2021-10-12 DIAGNOSIS — H66001 Acute suppurative otitis media without spontaneous rupture of ear drum, right ear: Secondary | ICD-10-CM | POA: Diagnosis not present

## 2021-10-12 DIAGNOSIS — J440 Chronic obstructive pulmonary disease with acute lower respiratory infection: Secondary | ICD-10-CM

## 2021-10-12 MED ORDER — ALBUTEROL SULFATE HFA 108 (90 BASE) MCG/ACT IN AERS: 2.0000 | INHALATION_SPRAY | Freq: Four times a day (QID) | RESPIRATORY_TRACT | 3 refills | Status: DC | PRN

## 2021-10-12 MED ORDER — CLARITHROMYCIN 500 MG PO TABS: 500.0000 mg | ORAL_TABLET | Freq: Two times a day (BID) | ORAL | 0 refills | Status: AC

## 2021-10-12 MED ORDER — METHYLPREDNISOLONE 4 MG PO TBPK: ORAL_TABLET | ORAL | 0 refills | Status: DC

## 2021-10-12 NOTE — Discharge Instructions (Signed)
To treat the infection in your right ear as well as bacterial presence in your lungs that is exacerbating your bronchitis, please begin Biaxin 500 mg tablets twice daily for 10 days.  I have also provided you with a Medrol Dosepak I would like you to begin taking tomorrow morning, please take 1 full row of tablets with your morning meal.  This medication is best taken with food that has a high fat content for better absorption.  Please continue to use your albuterol inhaler at least 4 times daily, 2 puffs each time, and more frequently as needed for cough and shortness of breath.  Please follow-up with your primary care provider if you do not see meaningful improvement of your symptoms.  If you are unable to get an appointment with your primary care provider, we are happy to see you back here at urgent care.  Thank you for visiting urgent care today.

## 2021-10-12 NOTE — ED Provider Notes (Signed)
[facility]    CSN: [id number] Arrival date & time: 10/12/21  0941    HISTORY   Chief Complaint  Patient presents with   Nasal Congestion   Facial Pain   HPI Deborah Watts is a pleasant, 56 y.o. female who presents to urgent care today. Patient complains of some nasal congestion and sinus pressure for the past 5 days.  Denies fever.  Endorses chest congestion and shortness of breath.  Patient also complains of upper back pain and productive cough.  Patient states she is here today following up on ED visit, states they diagnosed her with a viral illness gave her Tessalon Perles and nasal spray but she has had no relief of her symptoms.  The history is provided by the patient.   Past Medical History:  Diagnosis Date   Anxiety    Arthritis    Asthma    Headache    migraines   History of hiatal hernia    repaired 30 yrs ago in [place]   Hypertension    MHA (microangiopathic hemolytic anemia) (HCC)    Patient Active Problem List   Diagnosis Date Noted   Asthma, chronic, unspecified asthma severity, uncomplicated 05/02/2021   Loud snoring 11/28/2019   Daytime sleepiness 11/28/2019   Other fatigue 11/28/2019   Primary osteoarthritis of left knee 08/02/2019   Rib pain 10/19/2018   Right knee pain 04/12/2017   Preventative health care 03/25/2016   Left ankle pain 01/14/2016   Sinusitis, acute 12/22/2014   Left leg pain 04/28/2014   Right calf pain 04/20/2014   Acute bacterial sinusitis 04/14/2014   Obesity (BMI 30-39.9) 01/06/2013   Influenza 03/10/2011   Thrush, oral 03/04/2011   TINEA CRURIS 02/05/2010   HYPOKALEMIA 04/26/2009   LOW BACK PAIN SYNDROME 05/26/2007   LEG CRAMPS 05/14/2007   EDEMA 05/14/2007   Chronic migraine without aura, with status migrainosus 05/01/2007   ASTHMATIC BRONCHITIS, ACUTE 10/15/2006   Anxiety state 04/20/2006   Asthma with acute exacerbation 04/20/2006   RESTLESS LEG SYNDROME, HX OF 04/20/2006   Past Surgical History:   Procedure Laterality Date   APPENDECTOMY N/A    LAPAROSCOPIC CHOLECYSTECTOMY N/A 03/1992   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   [facility]   RE[facility]ACEMENT TOTAL KNEE Right 01/15/2021   [facility]   TOTAL ABDOMINAL HYSTERECTOMY Right    TUBAL LIGATION     WRIST ARTHROSCOPY Right 02/12/2018   Procedure: Right wrist arthroscopy, evaluation under anesthesia with debridement and repair as necessary and right carpal tunnel release;  Surgeon: Dominica Severin, MD;  Location: [facility] OR;  Service: Orthopedics;  Laterality: Right;  90 mins   OB History   No obstetric history on file.    Home Medications    Prior to Admission medications   Medication Sig Start Date End Date Taking? Authorizing Provider  albuterol (VENTOLIN HFA) 108 (90 Base) MCG/ACT inhaler Inhale 2 puffs into the lungs every 6 (six) hours as needed for wheezing or shortness of breath (Cough). 10/12/21  Yes Theadora Rama Scales, PA-C  benzonatate (TESSALON) 100 MG capsule Take 1 capsule (100 mg total) by mouth 3 (three) times daily as needed for cough. 10/11/21  Yes Margaretann Loveless, PA-C  brompheniramine-pseudoephedrine-DM 30-2-10 MG/5ML syrup Take 5 mLs by mouth 4 (four) times daily as needed. 10/11/21  Yes Margaretann Loveless, PA-C  clarithromycin (BIAXIN) 500 MG tablet Take 1 tablet (500 mg total) by mouth 2 (two) times daily for 10 days. 10/12/21 10/22/21 Yes Theadora Rama Scales, PA-C  diclofenac (VOLTAREN) 75 MG EC tablet Take 1 tablet (75 mg total) by mouth 2 (two) times daily. 05/02/21  Yes Seabron Spates R, DO  escitalopram (LEXAPRO) 20 MG tablet Take 1 tablet (20 mg total) by mouth daily. 05/02/21  Yes Seabron Spates R, DO  fluticasone-salmeterol (ADVAIR) 100-50 MCG/ACT AEPB Inhale into the lungs. 03/19/20  Yes [provider]  ipratropium (ATROVENT) 0.03 % nasal spray Place 2 sprays into both nostrils every 12 (twelve) hours. 10/11/21  Yes Margaretann Loveless, PA-C  methylPREDNISolone (MEDROL DOSEPAK) 4 MG TBPK  tablet Take 24 mg on day 1, 20 mg on day 2, 16 mg on day 3, 12 mg on day 4, 8 mg on day 5, 4 mg on day 6.  Take all tablets in each row at once, do not spread tablets out throughout the day. 10/12/21  Yes Theadora Rama Scales, PA-C  pregabalin (LYRICA) 200 MG capsule Take 200 mg by mouth in the morning and at bedtime. 10/09/21  Yes [provider]  topiramate (TOPAMAX) 50 MG tablet Take 1 tablet (50 mg total) by mouth 2 (two) times daily. 05/02/21  Yes Donato Schultz, DO  ALPRAZolam Prudy Feeler) 0.5 MG tablet Take 1 tablet (0.5 mg total) by mouth 3 (three) times daily as needed. 08/28/21   Zola Button, Grayling Congress, DO  Boswellia Serrata (BOSWELLIA PO) Take 1 capsule by mouth daily.    [provider]  BREO ELLIPTA 100-25 MCG/ACT AEPB Inhale 1 puff into the lungs daily. 05/13/21   Wanda Plump, MD  Meclizine HCl (TRAVEL SICKNESS) 25 MG CHEW CHEW 1 TABLET EVERY 6 HOURS AS NEEDED 09/12/21   Zola Button, Grayling Congress, DO  montelukast (SINGULAIR) 10 MG tablet Take 1 tablet (10 mg total) by mouth at bedtime. 05/02/21   Donato Schultz, DO  nystatin (MYCOSTATIN) 100000 UNIT/ML suspension Take 5 mLs (500,000 Units total) by mouth 4 (four) times daily. 01/27/19   Seabron Spates R, DO  SUMAtriptan (IMITREX) 100 MG tablet TAKE ONE TABLET BY MOUTH AS NEEDED FOR MIGRAINE. REPEAT IN 2 HOURS IF NEEDED. 05/02/21   Zola Button, Grayling Congress, DO  traMADol (ULTRAM) 50 MG tablet Take 1 tablet (50 mg total) by mouth every 8 (eight) hours as needed. 09/12/21   Seabron Spates R, DO  TURMERIC PO Take 1,000 mg by mouth daily.    [provider]    Family History Family History  Problem Relation Age of Onset   Throat cancer Father    Kidney failure Father    Asthma Other    Diabetes Other    Hyperlipidemia Other    Social History Social History   Tobacco Use   Smoking status: Never   Smokeless tobacco: Never  Vaping Use   Vaping Use: Never used  Substance Use Topics   Alcohol use: Yes     Alcohol/week: 0.0 standard drinks of alcohol    Comment: occassional   Drug use: No   Allergies   Fish allergy, Penicillins, Iohexol, Ivp dye [iodinated contrast media], and Morphine and related  Review of Systems Review of Systems Pertinent findings revealed after performing a 14 point review of systems has been noted in the history of present illness.  Physical Exam Triage Vital Signs ED Triage Vitals  Enc Vitals Group     BP 01/11/21 0827 (!) 147/82     Pulse Rate 01/11/21 0827 72     Resp 01/11/21 0827 18     Temp  01/11/21 0827 98.3 F (36.8 C)     Temp Source 01/11/21 0827 Oral     SpO2 01/11/21 0827 98 %     Weight --      Height --      Head Circumference --      Peak Flow --      Pain Score 01/11/21 0826 5     Pain Loc --      Pain Edu? --      Excl. in GC? --   No data found.  Updated Vital Signs BP 110/78 (BP Location: Left Arm)   Pulse 79   Temp 97.9 F (36.6 C) (Oral)   Resp 18   SpO2 95%   Physical Exam Vitals and nursing note reviewed.  Constitutional:      General: She is not in acute distress.    Appearance: Normal appearance. She is not ill-appearing.  HENT:     Head: Normocephalic and atraumatic.     Salivary Glands: Right salivary gland is not diffusely enlarged or tender. Left salivary gland is not diffusely enlarged or tender.     Right Ear: Hearing, ear canal and external ear normal. No drainage. No middle ear effusion. There is no impacted cerumen. Tympanic membrane is injected, erythematous and retracted. Tympanic membrane is not bulging.     Left Ear: Hearing, tympanic membrane, ear canal and external ear normal. No drainage.  No middle ear effusion. There is no impacted cerumen. Tympanic membrane is not erythematous or bulging.     Nose: Nose normal. No nasal deformity, septal deviation, mucosal edema, congestion or rhinorrhea.     Right Turbinates: Not enlarged, swollen or pale.     Left Turbinates: Not enlarged, swollen or pale.      Right Sinus: No maxillary sinus tenderness or frontal sinus tenderness.     Left Sinus: No maxillary sinus tenderness or frontal sinus tenderness.     Mouth/Throat:     Lips: Pink. No lesions.     Mouth: Mucous membranes are moist. No oral lesions.     Pharynx: Oropharynx is clear. Uvula midline. No posterior oropharyngeal erythema or uvula swelling.     Tonsils: No tonsillar exudate. 0 on the right. 0 on the left.  Eyes:     General: Lids are normal.        Right eye: No discharge.        Left eye: No discharge.     Extraocular Movements: Extraocular movements intact.     Conjunctiva/sclera: Conjunctivae normal.     Right eye: Right conjunctiva is not injected.     Left eye: Left conjunctiva is not injected.  Neck:     Trachea: Trachea and phonation normal.  Cardiovascular:     Rate and Rhythm: Normal rate and regular rhythm.     Pulses: Normal pulses.     Heart sounds: Normal heart sounds. No murmur heard.    No friction rub. No gallop.  Pulmonary:     Effort: Pulmonary effort is normal. No accessory muscle usage, prolonged expiration or respiratory distress.     Breath sounds: No stridor, decreased air movement or transmitted upper airway sounds. Examination of the right-upper field reveals wheezing. Examination of the left-middle field reveals rhonchi. Wheezing and rhonchi present. No decreased breath sounds or rales.  Chest:     Chest Kinnan: No tenderness.  Musculoskeletal:        General: Normal range of motion.     Cervical back: Normal range of  motion and neck supple. Normal range of motion.  Lymphadenopathy:     Cervical: No cervical adenopathy.  Skin:    General: Skin is warm and dry.     Findings: No erythema or rash.  Neurological:     General: No focal deficit present.     Mental Status: She is alert and oriented to person, place, and time.  Psychiatric:        Mood and Affect: Mood normal.        Behavior: Behavior normal.     Visual Acuity Right Eye  Distance:   Left Eye Distance:   Bilateral Distance:    Right Eye Near:   Left Eye Near:    Bilateral Near:     [facility] Couse / Diagnostics / Procedures:     Radiology No results found.  Procedures Procedures (including critical care time) EKG  Pending results:  Labs Reviewed - No data to display  Medications Ordered in UC: Medications - No data to display  UC Diagnoses / Final Clinical Impressions(s)   I have reviewed the triage vital signs and the nursing notes.  Pertinent labs & imaging results that were available during my care of the patient were reviewed by me and considered in my medical decision making (see chart for details).    Final diagnoses:  Acute suppurative otitis media of right ear without spontaneous rupture of tympanic membrane, recurrence not specified  Bronchitis, chronic obstructive w acute bronchitis (HCC)   Patient reports allergy to multiple antibiotics, states azithromycin "does not work for me", requests Biaxin instead for treatment of infection in her right ear..  Patient provided with a Medrol Dosepak for wheezing and rhonchi appreciated on exam.  Albuterol renewed.  Return precautions advised.  ED Prescriptions     Medication Sig Dispense Auth. Provider   clarithromycin (BIAXIN) 500 MG tablet Take 1 tablet (500 mg total) by mouth 2 (two) times daily for 10 days. 20 tablet Theadora RamaMorgan, Shriyan Arakawa Scales, PA-C   methylPREDNISolone (MEDROL DOSEPAK) 4 MG TBPK tablet Take 24 mg on day 1, 20 mg on day 2, 16 mg on day 3, 12 mg on day 4, 8 mg on day 5, 4 mg on day 6.  Take all tablets in each row at once, do not spread tablets out throughout the day. 21 tablet Theadora RamaMorgan, Brayton Baumgartner Scales, PA-C   albuterol (VENTOLIN HFA) 108 (90 Base) MCG/ACT inhaler Inhale 2 puffs into the lungs every 6 (six) hours as needed for wheezing or shortness of breath (Cough). 18 g Theadora RamaMorgan, Kieth Hartis Scales, PA-C      PDMP not reviewed this encounter.  Disposition Upon Discharge:  Condition:  stable for discharge home Home: take medications as prescribed; routine discharge instructions as discussed; follow up as advised.  Patient presented with an acute illness with associated systemic symptoms and significant discomfort requiring urgent management. In my opinion, this is a condition that a prudent lay person (someone who possesses an average knowledge of health and medicine) may potentially expect to result in complications if not addressed urgently such as respiratory distress, impairment of bodily function or dysfunction of bodily organs.   Routine symptom specific, illness specific and/or disease specific instructions were discussed with the patient and/or caregiver at length.   As such, the patient has been evaluated and assessed, work-up was performed and treatment was provided in alignment with urgent care protocols and evidence based medicine.  Patient/parent/caregiver has been advised that the patient may require follow up for further testing and treatment  if the symptoms continue in spite of treatment, as clinically indicated and appropriate.  If the patient was tested for COVID-19, Influenza and/or RSV, then the patient/parent/guardian was advised to isolate at home pending the results of his/her diagnostic coronavirus test and potentially longer if they're positive. I have also advised pt that if his/her COVID-19 test returns positive, it's recommended to self-isolate for at least 10 days after symptoms first appeared AND until fever-free for 24 hours without fever reducer AND other symptoms have improved or resolved. Discussed self-isolation recommendations as well as instructions for household member/close contacts as per the [facility] and [place] DHHS, and also gave patient the COVID packet with this information.  Patient/parent/caregiver has been advised to return to the [facility] or PCP in 3-5 days if no better; to PCP or the Emergency Department if new signs and symptoms develop, or if the  current signs or symptoms continue to change or worsen for further workup, evaluation and treatment as clinically indicated and appropriate  The patient will follow up with their current PCP if and as advised. If the patient does not currently have a PCP we will assist them in obtaining one.   The patient may need specialty follow up if the symptoms continue, in spite of conservative treatment and management, for further workup, evaluation, consultation and treatment as clinically indicated and appropriate.  Patient/parent/caregiver verbalized understanding and agreement of plan as discussed.  All questions were addressed during visit.  Please see discharge instructions below for further details of plan.  Discharge Instructions:   Discharge Instructions      To treat the infection in your right ear as well as bacterial presence in your lungs that is exacerbating your bronchitis, please begin Biaxin 500 mg tablets twice daily for 10 days.  I have also provided you with a Medrol Dosepak I would like you to begin taking tomorrow morning, please take 1 full row of tablets with your morning meal.  This medication is best taken with food that has a high fat content for better absorption.  Please continue to use your albuterol inhaler at least 4 times daily, 2 puffs each time, and more frequently as needed for cough and shortness of breath.  Please follow-up with your primary care provider if you do not see meaningful improvement of your symptoms.  If you are unable to get an appointment with your primary care provider, we are happy to see you back here at urgent care.  Thank you for visiting urgent care today.    This office note has been dictated using Teaching laboratory technician.  Unfortunately, this method of dictation can sometimes lead to typographical or grammatical errors.  I apologize for your inconvenience in advance if this occurs.  Please do not hesitate to reach out to me if  clarification is needed.      Theadora Rama Scales, [place] 10/13/21 [phone]

## 2021-10-12 NOTE — ED Triage Notes (Signed)
Patient c/o nasal congestion and sinus pressure x 5 days.   Patient denies fever.   Patient endorses chest congestion and SOB.   Patient endorses upper back pain. Patient endorses productive cough.   Patient is following up on an E-Visit where she seen on MyChart by a provider. Patient states she was diagnosed with a viral illness.   Patient has taken tessalon pearls and "nasal spray" with no relief  of symptoms.

## 2021-10-14 NOTE — Telephone Encounter (Signed)
Left message on machine to call back  

## 2021-10-14 NOTE — Telephone Encounter (Signed)
Is pt on Breo or Advair?

## 2021-10-15 ENCOUNTER — Telehealth: Payer: Self-pay | Admitting: Family Medicine

## 2021-10-15 NOTE — Telephone Encounter (Signed)
Patient returned the call from our office regarding mix up on her medications. Please call patient to follow up on medication.

## 2021-10-16 DIAGNOSIS — M4726 Other spondylosis with radiculopathy, lumbar region: Secondary | ICD-10-CM | POA: Diagnosis not present

## 2021-10-16 DIAGNOSIS — M5451 Vertebrogenic low back pain: Secondary | ICD-10-CM | POA: Diagnosis not present

## 2021-10-18 NOTE — Telephone Encounter (Signed)
VM left for pt to return call

## 2021-10-23 ENCOUNTER — Encounter: Payer: Self-pay | Admitting: Family Medicine

## 2021-10-23 ENCOUNTER — Other Ambulatory Visit: Payer: Self-pay | Admitting: Family Medicine

## 2021-10-23 DIAGNOSIS — M170 Bilateral primary osteoarthritis of knee: Secondary | ICD-10-CM

## 2021-10-23 DIAGNOSIS — M4726 Other spondylosis with radiculopathy, lumbar region: Secondary | ICD-10-CM | POA: Diagnosis not present

## 2021-10-23 DIAGNOSIS — M5451 Vertebrogenic low back pain: Secondary | ICD-10-CM | POA: Diagnosis not present

## 2021-10-23 DIAGNOSIS — F411 Generalized anxiety disorder: Secondary | ICD-10-CM

## 2021-10-23 MED ORDER — DICLOFENAC SODIUM 75 MG PO TBEC: 75.0000 mg | DELAYED_RELEASE_TABLET | Freq: Two times a day (BID) | ORAL | 1 refills | Status: DC

## 2021-10-23 NOTE — Telephone Encounter (Signed)
Requesting: alprazolam 0.5mg   Contract: 05/02/21 UDS: 05/02/21 Last Visit: 05/02/21 Next Visit: 10/28/21 Last Refill: 08/28/2021 #90 and 1RF  Please Advise

## 2021-10-28 ENCOUNTER — Encounter: Payer: Self-pay | Admitting: Family Medicine

## 2021-10-28 ENCOUNTER — Ambulatory Visit: Payer: BC Managed Care – PPO | Admitting: Family Medicine

## 2021-10-28 VITALS — BP 110/74 | HR 84 | Temp 97.9°F | Resp 20 | Ht 59.0 in | Wt 215.6 lb

## 2021-10-28 DIAGNOSIS — I10 Essential (primary) hypertension: Secondary | ICD-10-CM | POA: Diagnosis not present

## 2021-10-28 DIAGNOSIS — F418 Other specified anxiety disorders: Secondary | ICD-10-CM

## 2021-10-28 LAB — COMPREHENSIVE METABOLIC PANEL
ALT: 30 U/L (ref 0–35)
AST: 21 U/L (ref 0–37)
Albumin: 4 g/dL (ref 3.5–5.2)
Alkaline Phosphatase: 77 U/L (ref 39–117)
BUN: 15 mg/dL (ref 6–23)
CO2: 26 mEq/L (ref 19–32)
Calcium: 8.8 mg/dL (ref 8.4–10.5)
Chloride: 108 mEq/L (ref 96–112)
Creatinine, Ser: 0.68 mg/dL (ref 0.40–1.20)
GFR: 96.74 mL/min (ref >60.00)
Glucose, Bld: 95 mg/dL (ref 70–99)
Potassium: 4.2 mEq/L (ref 3.5–5.1)
Sodium: 141 mEq/L (ref 135–145)
Total Bilirubin: 0.4 mg/dL (ref 0.2–1.2)
Total Protein: 6.4 g/dL (ref 6.0–8.3)

## 2021-10-28 LAB — CBC WITH DIFFERENTIAL/PLATELET
Basophils Absolute: 0.1 10*3/uL (ref 0.0–0.1)
Basophils Relative: 1 % (ref 0.0–3.0)
Eosinophils Absolute: 0.6 10*3/uL (ref 0.0–0.7)
Eosinophils Relative: 5.7 % — ABNORMAL HIGH (ref 0.0–5.0)
HCT: 40.9 % (ref 36.0–46.0)
Hemoglobin: 13.1 g/dL (ref 12.0–15.0)
Lymphocytes Relative: 21.6 % (ref 12.0–46.0)
Lymphs Abs: 2.1 10*3/uL (ref 0.7–4.0)
MCHC: 32.1 g/dL (ref 30.0–36.0)
MCV: 88.7 fl (ref 78.0–100.0)
Monocytes Absolute: 0.6 10*3/uL (ref 0.1–1.0)
Monocytes Relative: 6.5 % (ref 3.0–12.0)
Neutro Abs: 6.4 10*3/uL (ref 1.4–7.7)
Neutrophils Relative %: 65.2 % (ref 43.0–77.0)
Platelets: 265 10*3/uL (ref 150.0–400.0)
RBC: 4.61 Mil/uL (ref 3.87–5.11)
RDW: 15.2 % (ref 11.5–15.5)
WBC: 9.8 10*3/uL (ref 4.0–10.5)

## 2021-10-28 LAB — LIPID PANEL
Cholesterol: 167 mg/dL (ref 0–200)
HDL: 33.4 mg/dL — ABNORMAL LOW (ref >39.00)
LDL Cholesterol: 101 mg/dL — ABNORMAL HIGH (ref 0–99)
NonHDL: 133.89
Total CHOL/HDL Ratio: 5
Triglycerides: 164 mg/dL — ABNORMAL HIGH (ref 0.0–149.0)
VLDL: 32.8 mg/dL (ref 0.0–40.0)

## 2021-10-28 MED ORDER — BUPROPION HCL ER (XL) 150 MG PO TB24: ORAL_TABLET | ORAL | 0 refills | Status: DC

## 2021-10-28 NOTE — Assessment & Plan Note (Signed)
Well controlled, no changes to meds. Encouraged heart healthy diet such as the DASH diet and exercise as tolerated.  °

## 2021-10-28 NOTE — Assessment & Plan Note (Signed)
con't lexapro  Add wellbutrin xl 150 mg qd x 1 week then inc to 2 a dsy  Call if any trouble with med Discussed side effects with pt

## 2021-10-28 NOTE — Patient Instructions (Signed)

## 2021-10-28 NOTE — Progress Notes (Signed)
Subjective:   By signing my name below, I, Cassell Clement, attest that this documentation has been prepared under the direction and in the presence of Donato Schultz DO 10/28/2021     Patient ID: Deborah Watts, female    DOB: August 17, 1965, 56 y.o.   MRN: [id number]  Chief Complaint  Patient presents with   Depression   Anxiety   Follow-up    HPI Patient is in today for office visit  She believes that she needs to adjust her depression and anxiety medications. She is interested in adding a medication for her symptoms. She is feeling more "down in the dumps" more than "anxious" as of recently. 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 She is not seeing a counselor at the moment.   She takes 200 Mg of Lyrica twice a day. She takes the medication for her sciatica and spinal symptoms. She regularly follows up with her pain medicine specialists. She is scheduled to receive injections in 11/2021.  As of today's visit, her blood pressure is normal. She has been trying to improve her weight. She does physical therapy every week.  BP Readings from Last 3 Encounters:  10/28/21 110/74  10/12/21 110/78  08/29/21 121/68   Pulse Readings from Last 3 Encounters:  10/28/21 84  10/12/21 79  08/29/21 66    Past Medical History:  Diagnosis Date   Anxiety    Arthritis    Asthma    Headache    migraines   History of hiatal hernia    repaired 30 yrs ago in [place]   Hypertension    MHA (microangiopathic hemolytic anemia) (HCC)     Past Surgical History:  Procedure Laterality Date   APPENDECTOMY N/A    LAPAROSCOPIC CHOLECYSTECTOMY N/A 03/1992   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   RE[facility]ACEMENT TOTAL [facility]NEE Right [facility]/15/2021   [facility]   TOTAL ABDOMINAL HYSTERECTOMY Right    TUBAL LIGATION     WRIST ARTHROSCOPY Right 02/12/2018   Procedure: Right wrist arthroscopy, evaluation under anesthesia with debridement and repair as necessary  and right carpal tunnel release;  Surgeon: Dominica Severin, MD;  Location: [facility] OR;  Service: Orthopedics;  Laterality: Right;  90 mins    Family History  Problem Relation Age of Onset   Throat cancer Father    Kidney failure Father    Asthma Other    Diabetes Other    Hyperlipidemia Other     Social History   Socioeconomic History   Marital status: Married    Spouse name: Not on file   Number of children: Not on file   Years of education: Not on file   Highest education level: Not on file  Occupational History   Occupation: Statistician    Employer: [id number] [place]  Tobacco Use   Smoking status: Never   Smokeless tobacco: Never  Vaping Use   Vaping Use: Never used  Substance and Sexual Activity   Alcohol use: Yes    Alcohol/week: 0.0 standard drinks of alcohol    Comment: occassional   Drug use: No   Sexual activity: Yes    Partners: Male  Other Topics Concern   Not on file  Social History Narrative   Exercise-- no   Social Determinants of Health   Financial Resource Strain: Not on file  Food Insecurity: Not on file  Transportation Needs: Not on file  Physical Activity: Not on  file  Stress: Not on file  Social Connections: Not on file  Intimate Partner Violence: Not on file    Outpatient Medications Prior to Visit  Medication Sig Dispense Refill   albuterol (VENTOLIN HFA) 108 (90 Base) MCG/ACT inhaler Inhale 2 puffs into the lungs every 6 (six) hours as needed for wheezing or shortness of breath (Cough). 18 g 3   ALPRAZolam (XANAX) 0.5 MG tablet TAKE 1 TABLET BY MOUTH THREE TIMES PER DAY AS NEEDED 90 tablet 1   Boswellia Serrata (BOSWELLIA PO) Take 1 capsule by mouth daily.     BREO ELLIPTA 100-25 MCG/ACT AEPB Inhale 1 puff into the lungs daily. 60 each 2   diclofenac (VOLTAREN) 75 MG EC tablet Take 1 tablet (75 mg total) by mouth 2 (two) times daily. 60 tablet 1   escitalopram (LEXAPRO) 20 MG tablet Take 1 tablet (20 mg total) by mouth daily. 90 tablet 3    fluticasone-salmeterol (ADVAIR) 100-50 MCG/ACT AEPB INHALE 2 DOSES BY MOUTH TWICE DAILY 180 each 0   ipratropium (ATROVENT) 0.03 % nasal spray Place 2 sprays into both nostrils every 12 (twelve) hours. 30 mL 0   Meclizine HCl (TRAVEL SICKNESS) 25 MG CHEW CHEW 1 TABLET EVERY 6 HOURS AS NEEDED 30 tablet 1   montelukast (SINGULAIR) 10 MG tablet Take 1 tablet (10 mg total) by mouth at bedtime. 90 tablet 3   nystatin (MYCOSTATIN) 100000 UNIT/ML suspension Take 5 mLs (500,000 Units total) by mouth 4 (four) times daily. 60 mL 0   pregabalin (LYRICA) 200 MG capsule Take 200 mg by mouth in the morning and at bedtime.     SUMAtriptan (IMITREX) 100 MG tablet TAKE ONE TABLET BY MOUTH AS NEEDED FOR MIGRAINE. REPEAT IN 2 HOURS IF NEEDED. 12 tablet 3   topiramate (TOPAMAX) 50 MG tablet Take 1 tablet (50 mg total) by mouth 2 (two) times daily. 180 tablet 1   traMADol (ULTRAM) 50 MG tablet Take 1 tablet (50 mg total) by mouth every 8 (eight) hours as needed. 30 tablet 0   TURMERIC PO Take 1,000 mg by mouth daily.     fluticasone-salmeterol (ADVAIR) 100-50 MCG/ACT AEPB Inhale into the lungs.     methylPREDNISolone (MEDROL DOSEPAK) 4 MG TBPK tablet Take 24 mg on day 1, 20 mg on day 2, 16 mg on day 3, 12 mg on day 4, 8 mg on day 5, 4 mg on day 6.  Take all tablets in each row at once, do not spread tablets out throughout the day. 21 tablet 0   benzonatate (TESSALON) 100 MG capsule Take 1 capsule (100 mg total) by mouth 3 (three) times daily as needed for cough. (Patient not taking: Reported on 10/28/2021) 30 capsule 0   brompheniramine-pseudoephedrine-DM 30-2-10 MG/5ML syrup Take 5 mLs by mouth 4 (four) times daily as needed. (Patient not taking: Reported on 10/28/2021) 120 mL 0   No facility-administered medications prior to visit.    Allergies  Allergen Reactions   Fish Allergy Hives and Swelling   Penicillins Anaphylaxis, Hives and Other (See Comments)    Has patient had a PCN reaction causing immediate rash,  facial/tongue/throat swelling, SOB or lightheadedness with hypotension: Unknown Has patient had a PCN reaction causing severe rash involving mucus membranes or skin necrosis: No Has patient had a PCN reaction that required hospitalization: Yes Has patient had a PCN reaction occurring within the last 10 years: No If all of the above answers are "NO", then may proceed with Cephalosporin use.  Tizanidine Hcl Nausea And Vomiting, Rash and Shortness Of Breath   Iohexol Other (See Comments)     Desc: hives, sob, pt. needs 13 hr prep   01/16/05    Ivp Dye [Iodinated Contrast Media] Hives and Swelling   Morphine And Related Nausea And Vomiting    Review of Systems  Constitutional:  Negative for fever and malaise/fatigue.  HENT:  Negative for congestion.   Eyes:  Negative for blurred vision.  Respiratory:  Negative for shortness of breath.   Cardiovascular:  Negative for chest pain, palpitations and leg swelling.  Gastrointestinal:  Negative for abdominal pain, blood in stool and nausea.  Genitourinary:  Negative for dysuria and frequency.  Musculoskeletal:  Negative for falls.  Skin:  Negative for rash.  Neurological:  Negative for dizziness, loss of consciousness and headaches.  Endo/Heme/Allergies:  Negative for environmental allergies.  Psychiatric/Behavioral:  Positive for depression. The patient is not nervous/anxious.        Objective:    Physical Exam Vitals and nursing note reviewed.  Constitutional:      General: She is not in acute distress.    Appearance: Normal appearance. She is well-developed. She is not ill-appearing.  HENT:     Head: Normocephalic and atraumatic.     Right Ear: External ear normal.     Left Ear: External ear normal.  Eyes:     Extraocular Movements: Extraocular movements intact.     Conjunctiva/sclera: Conjunctivae normal.     Pupils: Pupils are equal, round, and reactive to light.  Neck:     Thyroid: No thyromegaly.     Vascular: No carotid  bruit or JVD.  Cardiovascular:     Rate and Rhythm: Normal rate and regular rhythm.     Heart sounds: Normal heart sounds. No murmur heard.    No gallop.  Pulmonary:     Effort: Pulmonary effort is normal. No respiratory distress.     Breath sounds: Normal breath sounds. No wheezing or rales.  Chest:     Chest Hamric: No tenderness.  Musculoskeletal:     Cervical back: Normal range of motion and neck supple.  Skin:    General: Skin is warm and dry.  Neurological:     Mental Status: She is alert and oriented to person, place, and time.  Psychiatric:        Mood and Affect: Mood is depressed. Mood is not anxious. Affect is not tearful.        Thought Content: Thought content is not paranoid. Thought content does not include homicidal or suicidal ideation. Thought content does not include homicidal or suicidal plan.        Judgment: Judgment normal.     BP 110/74 (BP Location: Left Arm, Patient Position: Sitting, Cuff Size: Normal)   Pulse 84   Temp 97.9 F (36.6 C) (Oral)   Resp 20   Ht 4\' 11"  (1.499 m)   Wt 215 lb 9.6 oz (97.8 kg)   SpO2 97%   BMI 43.55 kg/m  Wt Readings from Last 3 Encounters:  10/28/21 215 lb 9.6 oz (97.8 kg)  08/29/21 219 lb (99.3 kg)  05/02/21 211 lb 12.8 oz (96.1 kg)    Diabetic Foot Exam - Simple   No data filed    Lab Results  Component Value Date   WBC 11.2 (H) 08/29/2021   HGB 13.8 08/29/2021   HCT 42.2 08/29/2021   PLT 368 08/29/2021   GLUCOSE 96 08/29/2021   CHOL 162 05/02/2021  TRIG 172.0 (H) 05/02/2021   HDL 36.20 (L) 05/02/2021   LDLDIRECT 124.0 03/27/2017   LDLCALC 91 05/02/2021   ALT 20 05/02/2021   AST 20 05/02/2021   NA 141 08/29/2021   K 3.9 08/29/2021   CL 109 08/29/2021   CREATININE 0.79 08/29/2021   BUN 14 08/29/2021   CO2 25 08/29/2021   TSH 0.77 05/02/2021    Lab Results  Component Value Date   TSH 0.77 05/02/2021   Lab Results  Component Value Date   WBC 11.2 (H) 08/29/2021   HGB 13.8 08/29/2021   HCT  42.2 08/29/2021   MCV 86.5 08/29/2021   PLT 368 08/29/2021   Lab Results  Component Value Date   NA 141 08/29/2021   K 3.9 08/29/2021   CO2 25 08/29/2021   GLUCOSE 96 08/29/2021   BUN 14 08/29/2021   CREATININE 0.79 08/29/2021   BILITOT 0.3 05/02/2021   ALKPHOS 83 05/02/2021   AST 20 05/02/2021   ALT 20 05/02/2021   PROT 7.0 05/02/2021   ALBUMIN 4.5 05/02/2021   CALCIUM 9.2 08/29/2021   ANIONGAP 7 08/29/2021   GFR 82.12 05/02/2021   Lab Results  Component Value Date   CHOL 162 05/02/2021   Lab Results  Component Value Date   HDL 36.20 (L) 05/02/2021   Lab Results  Component Value Date   LDLCALC 91 05/02/2021   Lab Results  Component Value Date   TRIG 172.0 (H) 05/02/2021   Lab Results  Component Value Date   CHOLHDL 4 05/02/2021   No results found for: "HGBA1C"     Assessment & Plan:   Problem List Items Addressed This Visit       Unprioritized   Primary hypertension    Well controlled, no changes to meds. Encouraged heart healthy diet such as the DASH diet and exercise as tolerated.       Relevant Orders   CBC with Differential/Platelet   Lipid panel   Comprehensive metabolic panel   Depression with anxiety - Primary    con't lexapro  Add wellbutrin xl 150 mg qd x 1 week then inc to 2 a dsy  Call if any trouble with med Discussed side effects with pt       Relevant Medications   buPROPion (WELLBUTRIN XL) 150 MG 24 hr tablet   Other Relevant Orders   Ambulatory referral to Psychology    Meds ordered this encounter  Medications   buPROPion (WELLBUTRIN XL) 150 MG 24 hr tablet    Sig: 1 po qam x 1 week then increase to 2 po qd    Dispense:  60 tablet    Refill:  0    I, Donato Schultz, DO, personally preformed the services described in this documentation.  All medical record entries made by the scribe were at my direction and in my presence.  I have reviewed the chart and discharge instructions (if applicable) and agree that the  record reflects my personal performance and is accurate and complete. 10/28/2021   I,Amber Collins,acting as a scribe for Donato Schultz, DO.,have documented all relevant documentation on the behalf of Donato Schultz, DO,as directed by  Donato Schultz, DO while in the presence of Donato Schultz, DO.    Donato Schultz, DO

## 2021-10-29 ENCOUNTER — Encounter: Payer: Self-pay | Admitting: Family Medicine

## 2021-10-30 NOTE — Telephone Encounter (Signed)
She was talking about the eosinophils relative. It was out of range at 5.7.

## 2021-11-06 DIAGNOSIS — M5451 Vertebrogenic low back pain: Secondary | ICD-10-CM | POA: Diagnosis not present

## 2021-11-06 DIAGNOSIS — M4726 Other spondylosis with radiculopathy, lumbar region: Secondary | ICD-10-CM | POA: Diagnosis not present

## 2021-11-13 DIAGNOSIS — M5451 Vertebrogenic low back pain: Secondary | ICD-10-CM | POA: Diagnosis not present

## 2021-11-13 DIAGNOSIS — M4726 Other spondylosis with radiculopathy, lumbar region: Secondary | ICD-10-CM | POA: Diagnosis not present

## 2021-11-21 ENCOUNTER — Other Ambulatory Visit: Payer: Self-pay | Admitting: Family Medicine

## 2021-11-21 DIAGNOSIS — F418 Other specified anxiety disorders: Secondary | ICD-10-CM

## 2021-11-21 DIAGNOSIS — M5136 Other intervertebral disc degeneration, lumbar region: Secondary | ICD-10-CM | POA: Diagnosis not present

## 2021-11-21 DIAGNOSIS — M4726 Other spondylosis with radiculopathy, lumbar region: Secondary | ICD-10-CM | POA: Diagnosis not present

## 2021-11-25 ENCOUNTER — Encounter: Payer: Self-pay | Admitting: Professional

## 2021-11-25 ENCOUNTER — Ambulatory Visit (INDEPENDENT_AMBULATORY_CARE_PROVIDER_SITE_OTHER): Payer: BC Managed Care – PPO | Admitting: Professional

## 2021-11-25 DIAGNOSIS — F331 Major depressive disorder, recurrent, moderate: Secondary | ICD-10-CM | POA: Insufficient documentation

## 2021-11-25 DIAGNOSIS — F411 Generalized anxiety disorder: Secondary | ICD-10-CM | POA: Diagnosis not present

## 2021-11-25 NOTE — Progress Notes (Signed)
[facility] Behavioral Health Counselor Initial Adult Exam  Name: Deborah Watts Date: 11/27/2021 MRN: [id number] DOB: 1965/07/05 PCP: Deborah Schultz, DO  Time spent: 52 minutes 1001-1053am  Guardian/Payee:  self    Paperwork requested: No   Reason for Visit /Presenting Problem: This session was held via video teletherapy. The patient consented to video teletherapy and was located at her home during this session. She is aware it is the responsibility of the patient to secure confidentiality on her end of the session. The provider was in a private home office for the duration of this session.    The patient arrived on time for her webex session.  Dr. Laury Watts suggested  I see you because I feel like I'm back in a box. Patient has issues with sciatica and recent got two shots in her back and it doesn't help the numbness. She has work stress due to her boss.  Mental Status Exam: Appearance:   Neat     Behavior:  Appropriate  Motor:  Normal  Speech/Language:   Clear and Coherent and Normal Rate  Affect:  Full Range  Mood:  anxious and depressed  Thought process:  normal  Thought content:    WNL  Sensory/Perceptual disturbances:    WNL  Orientation:  oriented to person, place, time/date, and situation  Attention:  Good  Concentration:  Good  Memory:  WNL  Fund of knowledge:   Good  Insight:    Good  Judgment:   Good  Impulse Control:  Good   Risk Assessment: Danger to Self:  No Self-injurious Behavior: No Danger to Others: No Duty to Warn:no Physical Aggression / Violence:No  Access to Firearms a concern: No  Gang Involvement:No  Patient / guardian was educated about steps to take if suicide or homicide risk level increases between visits: n/a While future psychiatric events cannot be accurately predicted, the patient does not currently require acute inpatient psychiatric care and does not currently meet [facility] involuntary commitment criteria.     Substance Abuse  History: Current substance abuse: Yes socially she will have a margarita; had a sex on the beach at the beach,  Pt reports she drinks about five times or less per year. She does not smoke and has never used illicit drugs.  Past Psychiatric History:   Previous psychological history is significant for anxiety and depression since bout 19-1 years old and was having issues with her mother-in-law Outpatient Providers: Dr. Laury Watts prescribes meds for her: Lexapro History of Psych Hospitalization: No  Psychological Testing: none   Abuse History:  Victim of: No.   Report needed: No. Victim of Neglect:No. Perpetrator of n/a  Witness / Exposure to Domestic Violence: No   Protective Services Involvement: No  Witness to [employer] Violence:  No   Family History:  Family History  Problem Relation Age of Onset   Throat cancer Father    Kidney failure Father    Asthma Other    Diabetes Other    Hyperlipidemia Other     Living situation: the patient lives with their family  Sexual Orientation: Straight  Relationship Status: married  Name of spouse / other: been married to Deborah Watts since 1989 and dated several years before that If a parent, number of children / ages:the pt has two children, Deborah Watts 25 and Deborah Watts 28. Deborah Watts has an 26 month old Deborah Watts.  Support Systems: spouse parents Oldest daughter, "my youngest would do for me ut the youngest would do for me."  She  has friends at work but spends her time with her family. Her cousin in [place] is her friend and they travel together.  Financial Stress:  No , the built their house four years ago and paid for it as they wen, they have acreage and [place].  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 Her "coach" at work is a black lady that is partial to the young black people and the older people that work in Clinical biochemist, even her older  black friend Deborah Watts. Deborah Watts wrote her up and told her she was non-productive and is constantly finding something to nitpick at. I don't like how she talks down to the older ones.  Pt has to work since her husband retired from [employer] and she carries the benefits.  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 She was scared to retake.  Religion/Sprituality/World View: Deborah Watts, attends the [place] and on Thursdays bible study is held at her barn.  Any cultural differences that may affect / interfere with treatment:  not applicable   Recreation/Hobbies: beach, tubing, shopping, time with granddaughter and buying her things  Stressors: Health problems   Loss of father on July 14, 2020 from organ failure; he chose that path cause he was real sick in the hospital and then blood started pulling and he made the decision to stop taking his medication and he died in the hospital. He passed within 72 hours and the patient, her sister/brother-in-law and her mother Patient denies being angry with him refusing to take his medication and they told him he oculd go when he wanted. "Me and him was real close, you could tell him anything, he was a vault." Her dad was her go-to as a teen and needed. She talks to her father's ashes with his watch that her mother gave her and some pictures.   Occupational concerns    Strengths: Supportive Relationships, Family, Friends, and Arts administrator; she and coworker Deborah Watts used to "go out together and go tubing, she's changed, I don't know what's going on with her."  Legal History: Pending legal issue / charges: The patient has no significant history of legal issues. History of legal issue / charges: none   Medical History/Surgical History: reviewed Past Medical History:  Diagnosis Date    Anxiety    Arthritis    Asthma    Headache    migraines   History of hiatal hernia    repaired 30 yrs ago in [place]   Hypertension    MHA (microangiopathic hemolytic anemia) (HCC)     Past Surgical History:  Procedure Laterality Date   APPENDECTOMY N/A    LAPAROSCOPIC CHOLECYSTECTOMY N/A 03/1992   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   REPLACEMENT TOTAL KNEE Right 01/15/2021   [facility]   TOTAL ABDOMINAL HYSTERECTOMY Right    TUBAL LIGATION     WRIST ARTHROSCOPY Right 02/12/2018   Procedure: Right wrist arthroscopy, evaluation under anesthesia with debridement and repair as necessary and right carpal tunnel release;  Surgeon: Dominica Severin, MD;  Location: [facility] OR;  Service: Orthopedics;  Laterality: Right;  90 mins    Medications: Current Outpatient Medications  Medication Sig Dispense Refill   albuterol (  VENTOLIN HFA) 108 (90 Base) MCG/ACT inhaler Inhale 2 puffs into the lungs every 6 (six) hours as needed for wheezing or shortness of breath (Cough). 18 g 3   ALPRAZolam (XANAX) 0.5 MG tablet TAKE 1 TABLET BY MOUTH THREE TIMES PER DAY AS NEEDED 90 tablet 1   Boswellia Serrata (BOSWELLIA PO) Take 1 capsule by mouth daily.     BREO ELLIPTA 100-25 MCG/ACT AEPB Inhale 1 puff into the lungs daily. 60 each 2   buPROPion (WELLBUTRIN XL) 150 MG 24 hr tablet TAKE ONE TABLET BY MOUTH ONCE DAILY FOR 1 WEEK, THEN INCREASE TO TWO TABLETS DAILY 60 tablet 0   diclofenac (VOLTAREN) 75 MG EC tablet Take 1 tablet (75 mg total) by mouth 2 (two) times daily. 60 tablet 1   escitalopram (LEXAPRO) 20 MG tablet Take 1 tablet (20 mg total) by mouth daily. 90 tablet 3   fluticasone-salmeterol (ADVAIR) 100-50 MCG/ACT AEPB INHALE 2 DOSES BY MOUTH TWICE DAILY 180 each 0   ipratropium (ATROVENT) 0.03 % nasal spray Place 2 sprays into both nostrils every 12 (twelve) hours. 30 mL 0   Meclizine HCl (TRAVEL SICKNESS) 25 MG CHEW CHEW 1 TABLET EVERY 6 HOURS AS NEEDED 30 tablet 1   montelukast  (SINGULAIR) 10 MG tablet Take 1 tablet (10 mg total) by mouth at bedtime. 90 tablet 3   nystatin (MYCOSTATIN) 100000 UNIT/ML suspension Take 5 mLs (500,000 Units total) by mouth 4 (four) times daily. 60 mL 0   pregabalin (LYRICA) 200 MG capsule Take 200 mg by mouth in the morning and at bedtime.     SUMAtriptan (IMITREX) 100 MG tablet TAKE ONE TABLET BY MOUTH AS NEEDED FOR MIGRAINE. REPEAT IN 2 HOURS IF NEEDED. 12 tablet 3   topiramate (TOPAMAX) 50 MG tablet Take 1 tablet (50 mg total) by mouth 2 (two) times daily. 180 tablet 1   traMADol (ULTRAM) 50 MG tablet Take 1 tablet (50 mg total) by mouth every 8 (eight) hours as needed. 30 tablet 0   TURMERIC PO Take 1,000 mg by mouth daily.     No current facility-administered medications for this visit.    Allergies  Allergen Reactions   Fish Allergy Hives and Swelling   Penicillins Anaphylaxis, Hives and Other (See Comments)    Has patient had a PCN reaction causing immediate rash, facial/tongue/throat swelling, SOB or lightheadedness with hypotension: Unknown Has patient had a PCN reaction causing severe rash involving mucus membranes or skin necrosis: No Has patient had a PCN reaction that required hospitalization: Yes Has patient had a PCN reaction occurring within the last 10 years: No If all of the above answers are "NO", then may proceed with Cephalosporin use.    Tizanidine Hcl Nausea And Vomiting, Rash and Shortness Of Breath   Iohexol Other (See Comments)     Desc: hives, sob, pt. needs 13 hr prep   01/16/05    Ivp Dye [Iodinated Contrast Media] Hives and Swelling   Morphine And Related Nausea And Vomiting    Diagnoses:  Major depressive disorder, recurrent episode, moderate (HCC)  Generalized anxiety disorder  Plan of Care:  -meet biweekly at pt request -next session will be Monday, December 09, 2021 at [facility] via [employer]

## 2021-11-25 NOTE — Progress Notes (Signed)
° ° ° ° ° ° ° ° ° ° ° ° ° ° °  Leveta Wahab, LCMHC °

## 2021-12-04 DIAGNOSIS — M5451 Vertebrogenic low back pain: Secondary | ICD-10-CM | POA: Diagnosis not present

## 2021-12-04 DIAGNOSIS — M4726 Other spondylosis with radiculopathy, lumbar region: Secondary | ICD-10-CM | POA: Diagnosis not present

## 2021-12-09 ENCOUNTER — Ambulatory Visit: Payer: BC Managed Care – PPO | Admitting: Professional

## 2021-12-11 DIAGNOSIS — M5451 Vertebrogenic low back pain: Secondary | ICD-10-CM | POA: Diagnosis not present

## 2021-12-11 DIAGNOSIS — M4726 Other spondylosis with radiculopathy, lumbar region: Secondary | ICD-10-CM | POA: Diagnosis not present

## 2021-12-18 ENCOUNTER — Other Ambulatory Visit: Payer: Self-pay | Admitting: Family Medicine

## 2021-12-18 DIAGNOSIS — R109 Unspecified abdominal pain: Secondary | ICD-10-CM

## 2021-12-18 DIAGNOSIS — M170 Bilateral primary osteoarthritis of knee: Secondary | ICD-10-CM

## 2021-12-18 MED ORDER — TRAMADOL HCL 50 MG PO TABS: 50.0000 mg | ORAL_TABLET | Freq: Three times a day (TID) | ORAL | 0 refills | Status: DC | PRN

## 2021-12-18 NOTE — Telephone Encounter (Signed)
Requesting: tramadol 50mg   Contract: 05/02/21 UDS: 05/02/21 Last Visit: 10/28/21 Next Visit: 05/05/22 Last Refill: 09/12/21 #30 and 0RF  Please Advise

## 2021-12-20 ENCOUNTER — Encounter: Payer: Self-pay | Admitting: Family Medicine

## 2021-12-20 ENCOUNTER — Other Ambulatory Visit: Payer: Self-pay | Admitting: Family Medicine

## 2021-12-20 DIAGNOSIS — F418 Other specified anxiety disorders: Secondary | ICD-10-CM

## 2021-12-20 DIAGNOSIS — F411 Generalized anxiety disorder: Secondary | ICD-10-CM

## 2021-12-20 MED ORDER — BUPROPION HCL ER (XL) 150 MG PO TB24: 300.0000 mg | ORAL_TABLET | Freq: Every day | ORAL | 0 refills | Status: DC

## 2021-12-20 MED ORDER — ALPRAZOLAM 0.5 MG PO TABS: ORAL_TABLET | ORAL | 1 refills | Status: DC

## 2021-12-20 NOTE — Telephone Encounter (Signed)
Requesting: Xanax Contract: 05/02/2021 UDS: 05/02/2021 Last OV: 10/28/2021 Next OV: 05/05/2022 Last Refill: 10/23/2021, #90--1  RF Database:   Please advise

## 2021-12-23 ENCOUNTER — Ambulatory Visit (INDEPENDENT_AMBULATORY_CARE_PROVIDER_SITE_OTHER): Payer: BC Managed Care – PPO | Admitting: Professional

## 2021-12-23 ENCOUNTER — Encounter: Payer: Self-pay | Admitting: Professional

## 2021-12-23 DIAGNOSIS — F411 Generalized anxiety disorder: Secondary | ICD-10-CM

## 2021-12-23 DIAGNOSIS — F331 Major depressive disorder, recurrent, moderate: Secondary | ICD-10-CM | POA: Diagnosis not present

## 2021-12-23 NOTE — Progress Notes (Signed)
° ° ° ° ° ° ° ° ° ° ° ° ° ° °  Keya Wynes, LCMHC °

## 2021-12-23 NOTE — Progress Notes (Signed)
k

## 2021-12-23 NOTE — Progress Notes (Addendum)
[place] Counselor/Therapist Progress Note  Patient ID: Deborah Watts, MRN: [id number],    Date: 12/23/2021  Time Spent: 54 minutes 11-1154am  Treatment Type: Individual Therapy  Risk Assessment: Danger to Self:  No Self-injurious Behavior: No Danger to Others: No  Subjective:  This session was held via video teletherapy The patient consented to video teletherapy and was located at her home during this session. She is aware it is the responsibility of the patient to secure confidentiality on her end of the session. The provider was in a private home office for the duration of this session.    The patient arrived on time for her webex session.   1-mood    12/23/2021   11:03 AM 05/02/2021    1:16 PM  Depression screen PHQ 2/9  Decreased Interest 2 0  Down, Depressed, Hopeless 1 1  PHQ - 2 Score 3 1  Altered sleeping 0   Tired, decreased energy 1   Change in appetite 2   Feeling bad or failure about yourself  0   Trouble concentrating 0   Moving slowly or fidgety/restless 1   Suicidal thoughts 0   PHQ-9 Score 7   Difficult doing work/chores Somewhat difficult     -pt angry with employer for writing her up 2-professional -pt reports her team lead told the pt she had five customer complaints -pt reports her supervisor told her she hated to do this (to write her up) -pt admits to having had 14.5 years of enjoying her job -last six month have been the hardest -strategies for getting through her work days   -think abut things that bring you joy   -stay focused on the person in front of her and providing that person excellent customer service skills 3-treatment planning -pt and Clinician developed pt's treatment plan -pt fully participated and agrees with her plan  Treatment Plan Problems Addressed  Anger Control Problems, Anxiety, Chronic Pain, Vocational Stress Goals 1. Acquire and utilize the necessary pain management skills. 2. Become capable of  handling angry feelings in constructive ways that enhance daily functioning. 3. Come to an awareness and acceptance of angry feelings while developing better control and more serenity. 4. Decrease the frequency, intensity, and duration of angry thoughts, feelings, and actions and increase the ability to recognize and respectfully express frustration and resolve conflict. 5. Demonstrate respect for others and their feelings. Objective Keep a daily journal of persons, situations, and other triggers of anger; record thoughts, feelings, and actions taken. Target Date: 2022-12-23 Frequency: Biweekly  Progress: 0 Modality: individual  Related Interventions Ask the client to self-monitor, keeping a daily journal in which he/she documents persons, situations, thoughts, feelings, and actions associated with moments of anger, irritation, or disappointment (or assign "Anger Journal" in the Adult Psychotherapy Homework Planner by Bryn Gulling); routinely process the journal toward helping the client understand his/her contributions to generating his/her anger. Assist the client in generating a list of anger triggers; process the list toward helping the client understand the causes and expressions of his/her anger. Objective Verbalize increased awareness of anger expression patterns, their causes, and their consequences. Target Date: 2022-12-23 Frequency: Biweekly  Progress: 0 Modality: individual  Related Interventions Assist the client in re-conceptualizing anger as involving different dimensions (cognitive, physiological, affective, and behavioral) that interact predictably (e.g., demanding expectations not being met leading to increased arousal and anger leading to acting out) and that can be understood, challenged, and changed. Process the client's list of anger triggers and other relevant  journal information toward helping the client understand how cognitive, physiological, and affective factors interplay to  produce anger. Ask the client to list and discuss ways anger has negatively impacted his/her daily life (e.g., hurting others or self, legal conflicts, loss of respect from self and others, destruction of property); process this list. Assist the client in identifying the positive consequences of managing anger (e.g., respect from others and self, cooperation from others, improved physical health, etc.) (or assign "Alternatives to Destructive Anger" in the Adult Psychotherapy Homework Planner by Bryn Gulling). Objective Read a book or treatment manual that supplements the therapy by improving understanding of anger and anger control problems. Target Date: 2022-12-23 Frequency: Biweekly  Progress: 0 Modality: individual  Related Interventions Assign the client reading material that educates him/her about anger and its management (e.g., Overcoming Situational and General Anger: Client Manual by Dolan Amen and Feliciana Rossetti; Of Course You're Angry by Rosselini and Rutha Bouchard; The Anger Control Workbook by Aggie Moats; Anger Management for Everyone by Leary Roca and Tafrate); process and revisit relevant themes throughout therapy to help the client consolidate his/her understanding of the treatment. Objective Learn and implement calming and coping strategies as part of an overall approach to managing anger. Target Date: 2022-12-23 Frequency: Biweekly  Progress: 0 Modality: individual  Related Interventions Teach the client calming techniques (e.g., progressive muscle relaxation, breathing induced relaxation, calming imagery, cue-controlled relaxation, applied relaxation, mindful breathing) as part of a tailored strategy for reducing chronic and acute physiological tension that accompanies the escalation of his/her angry feelings. Objective Identify, challenge, and replace anger-inducing self-talk with self-talk that facilitates a less angry reaction. Target Date: 2022-12-23 Frequency: Biweekly  Progress: 0  Modality: individual  Related Interventions Explore the client's self-talk that mediates his/her angry feelings and actions (e.g., demanding expectations reflected in should, must, or have-to statements); identify and challenge biases, assisting him/her in generating appraisals and self-talk that corrects for the biases and facilitates a more flexible and temperate response to frustration. Combine new self-talk with calming skills as part of a set of coping skills to manage anger. Role-play the use of relaxation and cognitive coping to visualized anger-provoking scenes, moving from low- to high-anger scenes. Assign the implementation of calming techniques in his/her daily life and when facing anger-triggering situations; process the results, reinforcing success and problem-solving obstacles. Assign the client a homework exercise in which he/she identifies angry self-talk and generates alternatives that help moderate angry reactions; review; reinforce success, providing corrective feedback toward improvement. Objective Learn and implement thought-stopping to manage intrusive unwanted thoughts that trigger anger. Target Date: 2022-12-23 Frequency: Biweekly  Progress: 0 Modality: individual  Related Interventions Assign the client to implement a "thought-stopping" technique in which he/she shouts STOP to himself/herself in his/her mind and then replaces the thought with an alternative that is calming (or assign "Making Use of the Thought-Stopping Technique" in the Adult Psychotherapy Homework Planner by [facility]); review implantation, reinforcing success and providing corrective feedback for failure. Objective Verbalize an understanding of assertive communication and how it can be used to express thoughts and feelings of anger in a controlled, respectful way. Target Date: 2022-12-23 Frequency: Biweekly  Progress: 0 Modality: individual  Related Interventions Use instruction, modeling, and/or role-playing  to teach the client the distinctive elements as well as the pros and cons of assertive, unassertive (passive), and aggressive communication. 6. Develop an awareness of angry thoughts, feelings, and actions, clarifying origins of, and learning alternatives to aggressive anger. 7. Enhance ability to effectively cope with the full variety of life's  worries and anxieties. 8. Find an escape route from the pain. Objective Describe the nature of, history of, impact of, and understood causes of chronic pain. Target Date: 2022-12-23 Frequency: Biweekly  Progress: 0 Modality: individual  Related Interventions Assess the manifestation of chronic pain, its history, current status, triggers, and methods of coping (see The Handbook of Pain Assessment by Cleotilde Neer). Assess the impact of the pain on the patient's functioning in everyday life, including changes in the client's mood, attitude, social, vocational, and familial/marital roles. Objective Identify and monitor specific pain triggers. Target Date: 2022-12-23 Frequency: Biweekly  Progress: 0 Modality: individual  Related Interventions Teach the client self-monitoring of his/her symptoms; ask the client to keep a pain journal that records time of day, where and what he/she was doing, the severity of stress at the time, the severity of, and what was done to alleviate the pain (or assign "Pain and Stress Journal" in the Adult Psychotherapy Homework Planner by [facility]); process the journal with the client to increase understanding of the nature of the pain, cognitive, affective, and behavioral triggers, and the positive or negative effects of the coping strategies he/she is currently using. Objective Learn and implement calming skills such as relaxation, biofeedback, or mindfulness meditation to ease pain. Target Date: 2022-12-23 Frequency: Biweekly  Progress: 0 Modality: individual  Related Interventions Teach the client relaxation skills (e.g.,  progressive muscle relaxation, guided imagery, slow diaphragmatic breathing) or mindfulness meditation, explaining the rationale and how to apply these skills to his/her daily life (see New Directions in Progressive Muscle Relaxation by Casper Harrison, and Hazlett-Stevens). Objective Learn mental coping skills and implement with somatic skills for managing acute pain. Target Date: 2022-12-23 Frequency: Biweekly  Progress: 0 Modality: individual  Related Interventions Teach the client distraction techniques (e.g., pleasant imagery, counting techniques, alternative focal point) and how to use them with relaxation skills for the management of acute episodes of pain (or assign "Controlling the Focus on Physical Problems" in the Adult Psychotherapy Homework Planner by Bryn Gulling). 9. Find relief from pain and build renewed contentment and joy in performing activities of everyday life. 10. Implement cognitive behavioral skills necessary to solve problems in a more constructive manner. 11. Improve satisfaction and comfort surrounding coworker relationships. 12. Increase job satisfaction and performance due to implementation of assertiveness and stress management strategies. 27. Increase job security as a result of more positive evaluation of performance by a Librarian, academic. Objective Describe the nature and history of the vocational stress. Target Date: 2022-12-23 Frequency: Biweekly  Progress: 0 Modality: individual  Related Interventions Assess the client's history of vocational stress including perceived sources, client distress and disability, adaptive and maladaptive coping actions, and goals of treatment. Objective Identify and implement behavioral changes that could be made in workplace interactions to help resolve conflicts with coworkers or supervisors. Target Date: 2022-12-23 Frequency: Biweekly  Progress: 0 Modality: individual  Related Interventions Assign the client to write a plan for  constructive action (e.g., polite compliance with directedness, initiate a smiling greeting, compliment others' work, avoid critical judgments) that contains various alternatives to coworker or supervisor conflict. Use role-playing, behavioral rehearsal, and role rehearsal to increase the client's probability of positive encounters and to reduce anxiety with others in employment situation or job search (recommend Working Anger: Preventing and Resolving Conflict on the Job by [employer]). Objective Implement assertiveness skills. Target Date: 2022-12-23 Frequency: Biweekly  Progress: 0 Modality: individual  Related Interventions Train the client in assertiveness skills or refer to assertiveness training class that teaches  effective communication of needs and feelings without aggression or defensiveness. Objective Learn and implement problem-solving skills. Target Date: 2022-12-23 Frequency: Biweekly  Progress: 0 Modality: individual  Related Interventions Conduct Problem-Solving Therapy (see Problem-Solving Therapy by Shawnee Knapp and Delane Ginger) using techniques such as psychoeducation, modeling, and role-playing to teach the client problem-solving skills (i.e., defining a problem specifically, generating possible solutions, evaluating the pros and cons of each solution, selecting and implementing a plan of action, evaluating the efficacy of the plan, accepting or revising the plan); role-play application of the problem-solving skill to a real life issue (or assign "Applying Problem-Solving to Interpersonal Conflict" in the Adult Psychotherapy Homework Planner by Bryn Gulling). Objective Learn and implement calming skills to reduce overall anxiety and manage anxiety symptoms. Target Date: 2022-12-23 Frequency: Biweekly  Progress: 0 Modality: individual  Objective Identify own role in the conflict with coworkers or Librarian, academic. Target Date: 2022-12-23 Frequency: Biweekly  Progress: 0 Modality: individual   Related Interventions Clarify the nature of the client's conflicts in the work setting. Help the client identify his/her own role in the conflict, attempting to represent the other party's point of view. 14. Increase respectful communication through the use of assertiveness and conflict resolution skills. 15. Learn and implement anger management skills to reduce the level of anger and irritability that accompanies it. 16. Learn and implement coping skills that result in a reduction of anxiety and worry, and improved daily functioning. 17. Lessen daily suffering from pain. 18. Reduce overall frequency, intensity, and duration of the anxiety so that daily functioning is not impaired. 19. Resolve the core conflict that is the source of anxiety. 20. Stabilize anxiety level while increasing ability to function on a daily basis. Objective Describe situations, thoughts, feelings, and actions associated with anxieties and worries, their impact on functioning, and attempts to resolve them. Target Date: 2022-12-23 Frequency: Biweekly  Progress: 0 Modality: individual  Related Interventions Ask the client to describe his/her past experiences of anxiety and their impact on functioning; assess the focus, excessiveness, and uncontrollability of the worry and the type, frequency, intensity, and duration of his/her anxiety symptoms (consider using a structured interview such as The Anxiety Disorders Interview Schedule-Adult Version). Objective Verbalize an understanding of the cognitive, physiological, and behavioral components of anxiety and its treatment. Target Date: 2022-12-23 Frequency: Biweekly  Progress: 0 Modality: individual  Related Interventions Discuss how generalized anxiety typically involves excessive worry about unrealistic threats, various bodily expressions of tension, overarousal, and hypervigilance, and avoidance of what is threatening that interact to maintain the problem (see Mastery of  Your Anxiety and Worry: Therapist Guide by Corky Mull, and [place]; Treating Generalized Anxiety Disorder by Rygh and Amparo Bristol). Discuss how treatment targets worry, anxiety symptoms, and avoidance to help the client manage worry effectively, reduce overarousal, and eliminate unnecessary avoidance. Objective Learn and implement calming skills to reduce overall anxiety and manage anxiety symptoms. Target Date: 2022-12-23 Frequency: Biweekly  Progress: 0 Modality: individual  Related Interventions Teach the client calming/relaxation skills (e.g., applied relaxation, progressive muscle relaxation, cue controlled relaxation; mindful breathing; biofeedback) and how to discriminate better between relaxation and tension; teach the client how to apply these skills to his/her daily life (e.g., New Directions in Progressive Muscle Relaxation by Casper Harrison, and Hazlett-Stevens; Treating Generalized Anxiety Disorder by Rygh and Amparo Bristol). Assign the client homework each session in which he/she practices relaxation exercises daily, gradually applying them progressively from non-anxiety-provoking to anxiety-provoking situations; review and reinforce success while providing corrective feedback toward improvement. Assign the client to read about  progressive muscle relaxation and other calming strategies in relevant books or treatment manuals (e.g., Progressive Relaxation Training by Leroy Kennedy; Mastery of Your Anxiety and Worry: Workbook by Beckie Busing). Teach the client calming/relaxation skills (e.g., applied relaxation, progressive muscle relaxation, cue controlled relaxation, mindful breathing, biofeedback) and how to discriminate better between relaxation and tension; teach the client how to apply these skills to his/her daily life (e.g., New Directions in Progressive Muscle Relaxation by Casper Harrison, and Hazlett-Stevens; The Relaxation and Stress Reduction Workbook by Caryl Pina, and [place]). Assign the client homework each session in which he/she practices relaxation exercises daily, gradually applying them progressively from non-anxiety-provoking to anxiety-provoking situations; review and reinforce success while providing corrective feedback toward improvement. Objective Learn and implement a strategy to limit the association between various environmental settings and worry, delaying the worry until a designated "worry time." Target Date: 2022-12-23 Frequency: Biweekly  Progress: 0 Modality: individual  Related Interventions Explain the rationale for using a worry time as well as how it is to be used; agree upon and implement a worry time with the client. Teach the client how to recognize, stop, and postpone worry to the agreed upon worry time using skills such as thought stopping, relaxation, and redirecting attention (or assign "Making Use of the Thought-Stopping Technique" and/or "Worry Time" in the Adult Psychotherapy Homework Planner by Jongsma to assist skill development); encourage use in daily life; review and reinforce success while providing corrective feedback toward improvement. Objective Verbalize an understanding of the role that cognitive biases play in excessive irrational worry and persistent anxiety symptoms. Target Date: 2022-12-23 Frequency: Biweekly  Progress: 0 Modality: individual  Related Interventions Assist the client in analyzing his/her worries by examining potential biases such as the probability of the negative expectation occurring, the real consequences of it occurring, his/her ability to control the outcome, the worst possible outcome, and his/her ability to accept it (see "Analyze the Probability of a Feared Event" in the Adult Psychotherapy Homework Planner by Bryn Gulling; Cognitive Therapy of Anxiety Disorders by Alison Stalling). Objective Identify, challenge, and replace biased, fearful self-talk with positive, realistic,  and empowering self-talk. Target Date: 2022-12-23 Frequency: Biweekly  Progress: 0 Modality: individual  Related Interventions Explore the client's schema and self-talk that mediate his/her fear response; assist him/her in challenging the biases; replace the distorted messages with reality-based alternatives and positive, realistic self-talk that will increase his/her self-confidence in coping with irrational fears (see Cognitive Therapy of Anxiety Disorders by Alison Stalling). Assign the client a homework exercise in which he/she identifies fearful self-talk, identifies biases in the self-talk, generates alternatives, and tests through behavioral experiments (or assign "Negative Thoughts Trigger Negative Feelings" in the Adult Psychotherapy Homework Planner by [facility]); review and reinforce success, providing corrective feedback toward improvement.  Diagnosis:Major depressive disorder, recurrent episode, moderate (HCC)  Generalized anxiety disorder  Plan:  -focus on what brings you joy and good customer service -meet again on Monday, January 13, 2022 at 8am.

## 2021-12-30 DIAGNOSIS — M5136 Other intervertebral disc degeneration, lumbar region: Secondary | ICD-10-CM | POA: Diagnosis not present

## 2021-12-30 DIAGNOSIS — M47816 Spondylosis without myelopathy or radiculopathy, lumbar region: Secondary | ICD-10-CM | POA: Diagnosis not present

## 2021-12-30 DIAGNOSIS — M48062 Spinal stenosis, lumbar region with neurogenic claudication: Secondary | ICD-10-CM | POA: Diagnosis not present

## 2022-01-06 ENCOUNTER — Telehealth: Payer: BC Managed Care – PPO | Admitting: Family Medicine

## 2022-01-06 ENCOUNTER — Encounter: Payer: Self-pay | Admitting: Family Medicine

## 2022-01-06 DIAGNOSIS — J069 Acute upper respiratory infection, unspecified: Secondary | ICD-10-CM | POA: Diagnosis not present

## 2022-01-06 MED ORDER — FLUTICASONE PROPIONATE 50 MCG/ACT NA SUSP: 2.0000 | Freq: Every day | NASAL | 0 refills | Status: AC

## 2022-01-06 MED ORDER — BENZONATATE 100 MG PO CAPS: 100.0000 mg | ORAL_CAPSULE | Freq: Two times a day (BID) | ORAL | 0 refills | Status: DC | PRN

## 2022-01-06 NOTE — Progress Notes (Signed)

## 2022-01-13 ENCOUNTER — Encounter: Payer: Self-pay | Admitting: Professional

## 2022-01-13 ENCOUNTER — Ambulatory Visit (INDEPENDENT_AMBULATORY_CARE_PROVIDER_SITE_OTHER): Payer: BC Managed Care – PPO | Admitting: Professional

## 2022-01-13 DIAGNOSIS — F411 Generalized anxiety disorder: Secondary | ICD-10-CM

## 2022-01-13 DIAGNOSIS — F331 Major depressive disorder, recurrent, moderate: Secondary | ICD-10-CM | POA: Diagnosis not present

## 2022-01-13 NOTE — Progress Notes (Signed)
[facility] Counselor/Therapist Progress Note  Patient ID: Deborah Watts, MRN: [id number],    Date: 01/13/2022  Time Spent: 54 minutes [id number]  Treatment Type: Individual Therapy  Risk Assessment: Danger to Self:  No Self-injurious Behavior: No Danger to Others: No  Subjective:  This session was held via video teletherapy The patient consented to video teletherapy and was located at her home during this session. She is aware it is the responsibility of the patient to secure confidentiality on her end of the session. The provider was in a private home office for the duration of this session.    The patient arrived on time for her webex session.   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 4-finalized treatment planning with pt -pt fully participated and is in agreement with her plan  Treatment Plan Problems Addressed  Anger Control Problems, Anxiety, Chronic Pain, Vocational Stress Goals 1. Acquire and utilize the necessary pain management skills. 2. Become capable of handling angry feelings in constructive ways that enhance daily functioning. 3. Come to an awareness and acceptance of angry feelings while developing better control and more serenity. 4. Decrease the frequency, intensity, and duration of angry thoughts, feelings, and actions and increase the ability to recognize and respectfully express frustration and resolve conflict. 5. Demonstrate respect for others and their feelings. Objective Keep a daily journal of persons, situations, and other triggers of anger; record thoughts, feelings, and actions taken. Target  Date: 2022-12-23 Frequency: Biweekly  Progress: 0 Modality: individual  Related Interventions Ask the client to self-monitor, keeping a daily journal in which he/she documents persons, situations, thoughts, feelings, and actions associated with moments of anger, irritation, or disappointment (or assign "Anger Journal" in the Adult Psychotherapy Homework Planner by Bryn Gulling); routinely process the journal toward helping the client understand his/her contributions to generating his/her anger. Assist the client in generating a list of anger triggers; process the list toward helping the client understand the causes and expressions of his/her anger. Objective Verbalize increased awareness of anger expression patterns, their causes, and their consequences. Target Date: 2022-12-23 Frequency: Biweekly  Progress: 0 Modality: individual  Related Interventions Assist the client in re-conceptualizing anger as involving different dimensions (cognitive, physiological, affective, and behavioral) that interact predictably (e.g., demanding expectations not being met leading to increased arousal and anger leading to acting out) and that can be understood, challenged, and changed. Process the client's list of anger triggers and other relevant journal information toward helping the client understand how cognitive, physiological, and affective factors interplay to produce anger. Ask the client to list and discuss ways anger has negatively impacted his/her daily life (e.g., hurting others or self, legal conflicts, loss of respect from self and others, destruction of property); process this list. Assist the client in identifying the positive consequences of managing anger (e.g., respect from others and self, cooperation from others, improved physical health, etc.) (or assign "Alternatives to Destructive Anger" in the Adult Psychotherapy Homework Planner by Bryn Gulling). Objective Read a book or treatment manual that supplements  the therapy by improving understanding of anger and anger control problems. Target Date: 2022-12-23 Frequency: Biweekly  Progress: 0 Modality: individual  Related Interventions Assign the client reading material that educates him/her about anger and its management (e.g., Overcoming Situational and General Anger: Client Manual by Dolan Amen and Feliciana Rossetti; Of Course You're Angry by Rosselini and Rutha Bouchard; The Anger Control Workbook by Aggie Moats; Anger Management for Everyone by Leary Roca and Tafrate); process and revisit relevant themes throughout therapy to help the client consolidate his/her understanding of the treatment. Objective Learn and implement calming and coping strategies as part of an overall approach to managing anger. Target Date: 2022-12-23 Frequency: Biweekly  Progress: 0 Modality: individual  Related Interventions Teach the client calming techniques (e.g., progressive muscle relaxation, breathing induced relaxation, calming imagery, cue-controlled relaxation, applied relaxation, mindful breathing) as part of a tailored strategy for reducing chronic and acute physiological tension that accompanies the escalation of his/her angry feelings. Objective Identify, challenge, and replace anger-inducing self-talk with self-talk that facilitates a less angry reaction. Target Date: 2022-12-23 Frequency: Biweekly  Progress: 0 Modality: individual  Related Interventions Explore the client's self-talk that mediates his/her angry feelings and actions (e.g., demanding expectations reflected in should, must, or have-to statements); identify and challenge biases, assisting him/her in generating appraisals and self-talk that corrects for the biases and facilitates a more flexible and temperate response to frustration. Combine new self-talk with calming skills as part of a set of coping skills to manage anger. Role-play the use of relaxation and cognitive coping to visualized anger-provoking scenes,  moving from low- to high-anger scenes. Assign the implementation of calming techniques in his/her daily life and when facing anger-triggering situations; process the results, reinforcing success and problem-solving obstacles. Assign the client a homework exercise in which he/she identifies angry self-talk and generates alternatives that help moderate angry reactions; review; reinforce success, providing corrective feedback toward improvement. Objective Learn and implement thought-stopping to manage intrusive unwanted thoughts that trigger anger. Target Date: 2022-12-23 Frequency: Biweekly  Progress: 0 Modality: individual  Related Interventions Assign the client to implement a "thought-stopping" technique in which he/she shouts STOP to himself/herself in his/her mind and then replaces the thought with an alternative that is calming (or assign "Making Use of the Thought-Stopping Technique" in the Adult Psychotherapy Homework Planner by [facility]); review implantation, reinforcing success and providing corrective feedback for failure. Objective Verbalize an understanding of assertive communication and how it can be used to express thoughts and feelings of anger in a controlled, respectful way. Target Date: 2022-12-23 Frequency: Biweekly  Progress: 0 Modality: individual  Related Interventions Use instruction, modeling, and/or role-playing to teach the client the distinctive elements as well as the pros and cons of assertive, unassertive (passive), and aggressive communication. 6. Develop an awareness of angry thoughts, feelings, and actions, clarifying origins of, and learning alternatives to aggressive anger. 7. Enhance ability to effectively cope with the full variety of life's worries and anxieties. 8. Find an escape route from the pain. Objective Describe the nature of, history of, impact of, and understood causes of chronic pain. Target Date: 2022-12-23 Frequency: Biweekly  Progress: 0 Modality:  individual  Related Interventions Assess the manifestation of chronic pain, its history, current status, triggers, and methods of coping (see The Handbook of Pain Assessment by Cleotilde Neer). Assess the impact of the pain on the patient's functioning in everyday life, including changes in the client's mood, attitude, social, vocational, and familial/marital roles. Objective Identify and monitor specific pain triggers. Target Date: 2022-12-23 Frequency: Biweekly  Progress: 0 Modality: individual  Related Interventions Teach the client self-monitoring of his/her symptoms; ask the client to keep a  pain journal that records time of day, where and what he/she was doing, the severity of stress at the time, the severity of, and what was done to alleviate the pain (or assign "Pain and Stress Journal" in the Adult Psychotherapy Homework Planner by [facility]); process the journal with the client to increase understanding of the nature of the pain, cognitive, affective, and behavioral triggers, and the positive or negative effects of the coping strategies he/she is currently using. Objective Learn and implement calming skills such as relaxation, biofeedback, or mindfulness meditation to ease pain. Target Date: 2022-12-23 Frequency: Biweekly  Progress: 0 Modality: individual  Related Interventions Teach the client relaxation skills (e.g., progressive muscle relaxation, guided imagery, slow diaphragmatic breathing) or mindfulness meditation, explaining the rationale and how to apply these skills to his/her daily life (see New Directions in Progressive Muscle Relaxation by Casper Harrison, and Hazlett-Stevens). Objective Learn mental coping skills and implement with somatic skills for managing acute pain. Target Date: 2022-12-23 Frequency: Biweekly  Progress: 0 Modality: individual  Related Interventions Teach the client distraction techniques (e.g., pleasant imagery, counting techniques, alternative  focal point) and how to use them with relaxation skills for the management of acute episodes of pain (or assign "Controlling the Focus on Physical Problems" in the Adult Psychotherapy Homework Planner by Bryn Gulling). 9. Find relief from pain and build renewed contentment and joy in performing activities of everyday life. 10. Implement cognitive behavioral skills necessary to solve problems in a more constructive manner. 11. Improve satisfaction and comfort surrounding coworker relationships. 12. Increase job satisfaction and performance due to implementation of assertiveness and stress management strategies. 75. Increase job security as a result of more positive evaluation of performance by a Librarian, academic. Objective Describe the nature and history of the vocational stress. Target Date: 2022-12-23 Frequency: Biweekly  Progress: 0 Modality: individual  Related Interventions Assess the client's history of vocational stress including perceived sources, client distress and disability, adaptive and maladaptive coping actions, and goals of treatment. Objective Identify and implement behavioral changes that could be made in workplace interactions to help resolve conflicts with coworkers or supervisors. Target Date: 2022-12-23 Frequency: Biweekly  Progress: 0 Modality: individual  Related Interventions Assign the client to write a plan for constructive action (e.g., polite compliance with directedness, initiate a smiling greeting, compliment others' work, avoid critical judgments) that contains various alternatives to coworker or supervisor conflict. Use role-playing, behavioral rehearsal, and role rehearsal to increase the client's probability of positive encounters and to reduce anxiety with others in employment situation or job search (recommend Working Anger: Preventing and Resolving Conflict on the Job by [employer]). Objective Implement assertiveness skills. Target Date: 2022-12-23 Frequency:  Biweekly  Progress: 0 Modality: individual  Related Interventions Train the client in assertiveness skills or refer to assertiveness training class that teaches effective communication of needs and feelings without aggression or defensiveness. Objective Learn and implement problem-solving skills. Target Date: 2022-12-23 Frequency: Biweekly  Progress: 0 Modality: individual  Related Interventions Conduct Problem-Solving Therapy (see Problem-Solving Therapy by Shawnee Knapp and Delane Ginger) using techniques such as psychoeducation, modeling, and role-playing to teach the client problem-solving skills (i.e., defining a problem specifically, generating possible solutions, evaluating the pros and cons of each solution, selecting and implementing a plan of action, evaluating the efficacy of the plan, accepting or revising the plan); role-play application of the problem-solving skill to a real life issue (or assign "Applying Problem-Solving to Interpersonal Conflict" in the Adult Psychotherapy Homework Planner by Bryn Gulling). Objective Learn and implement calming skills to reduce overall  anxiety and manage anxiety symptoms. Target Date: 2022-12-23 Frequency: Biweekly  Progress: 0 Modality: individual  Objective Identify own role in the conflict with coworkers or Librarian, academic. Target Date: 2022-12-23 Frequency: Biweekly  Progress: 0 Modality: individual  Related Interventions Clarify the nature of the client's conflicts in the work setting. Help the client identify his/her own role in the conflict, attempting to represent the other party's point of view. 14. Increase respectful communication through the use of assertiveness and conflict resolution skills. 15. Learn and implement anger management skills to reduce the level of anger and irritability that accompanies it. 16. Learn and implement coping skills that result in a reduction of anxiety and worry, and improved daily functioning. 17. Lessen daily suffering from  pain. 18. Reduce overall frequency, intensity, and duration of the anxiety so that daily functioning is not impaired. 19. Resolve the core conflict that is the source of anxiety. 20. Stabilize anxiety level while increasing ability to function on a daily basis. Objective Describe situations, thoughts, feelings, and actions associated with anxieties and worries, their impact on functioning, and attempts to resolve them. Target Date: 2022-12-23 Frequency: Biweekly  Progress: 0 Modality: individual  Related Interventions Ask the client to describe his/her past experiences of anxiety and their impact on functioning; assess the focus, excessiveness, and uncontrollability of the worry and the type, frequency, intensity, and duration of his/her anxiety symptoms (consider using a structured interview such as The Anxiety Disorders Interview Schedule-Adult Version). Objective Verbalize an understanding of the cognitive, physiological, and behavioral components of anxiety and its treatment. Target Date: 2022-12-23 Frequency: Biweekly  Progress: 0 Modality: individual  Related Interventions Discuss how generalized anxiety typically involves excessive worry about unrealistic threats, various bodily expressions of tension, overarousal, and hypervigilance, and avoidance of what is threatening that interact to maintain the problem (see Mastery of Your Anxiety and Worry: Therapist Guide by Corky Mull, and Barlow; Treating Generalized Anxiety Disorder by Rygh and Amparo Bristol). Discuss how treatment targets worry, anxiety symptoms, and avoidance to help the client manage worry effectively, reduce overarousal, and eliminate unnecessary avoidance. Objective Learn and implement calming skills to reduce overall anxiety and manage anxiety symptoms. Target Date: 2022-12-23 Frequency: Biweekly  Progress: 0 Modality: individual  Related Interventions Teach the client calming/relaxation skills (e.g., applied  relaxation, progressive muscle relaxation, cue controlled relaxation; mindful breathing; biofeedback) and how to discriminate better between relaxation and tension; teach the client how to apply these skills to his/her daily life (e.g., New Directions in Progressive Muscle Relaxation by Casper Harrison, and Hazlett-Stevens; Treating Generalized Anxiety Disorder by Rygh and Amparo Bristol). Assign the client homework each session in which he/she practices relaxation exercises daily, gradually applying them progressively from non-anxiety-provoking to anxiety-provoking situations; review and reinforce success while providing corrective feedback toward improvement. Assign the client to read about progressive muscle relaxation and other calming strategies in relevant books or treatment manuals (e.g., Progressive Relaxation Training by Gwynneth Aliment and Dani Gobble; Mastery of Your Anxiety and Worry: Workbook by Beckie Busing). Teach the client calming/relaxation skills (e.g., applied relaxation, progressive muscle relaxation, cue controlled relaxation, mindful breathing, biofeedback) and how to discriminate better between relaxation and tension; teach the client how to apply these skills to his/her daily life (e.g., New Directions in Progressive Muscle Relaxation by Casper Harrison, and Hazlett-Stevens; The Relaxation and Stress Reduction Workbook by Caryl Pina, and Vandervoort). Assign the client homework each session in which he/she practices relaxation exercises daily, gradually applying them progressively from non-anxiety-provoking to anxiety-provoking situations; review and reinforce success while providing corrective  feedback toward improvement. Objective Learn and implement a strategy to limit the association between various environmental settings and worry, delaying the worry until a designated "worry time." Target Date: 2022-12-23 Frequency: Biweekly  Progress: 0 Modality: individual  Related  Interventions Explain the rationale for using a worry time as well as how it is to be used; agree upon and implement a worry time with the client. Teach the client how to recognize, stop, and postpone worry to the agreed upon worry time using skills such as thought stopping, relaxation, and redirecting attention (or assign "Making Use of the Thought-Stopping Technique" and/or "Worry Time" in the Adult Psychotherapy Homework Planner by Jongsma to assist skill development); encourage use in daily life; review and reinforce success while providing corrective feedback toward improvement. Objective Verbalize an understanding of the role that cognitive biases play in excessive irrational worry and persistent anxiety symptoms. Target Date: 2022-12-23 Frequency: Biweekly  Progress: 0 Modality: individual  Related Interventions Assist the client in analyzing his/her worries by examining potential biases such as the probability of the negative expectation occurring, the real consequences of it occurring, his/her ability to control the outcome, the worst possible outcome, and his/her ability to accept it (see "Analyze the Probability of a Feared Event" in the Adult Psychotherapy Homework Planner by Bryn Gulling; Cognitive Therapy of Anxiety Disorders by Alison Stalling). Objective Identify, challenge, and replace biased, fearful self-talk with positive, realistic, and empowering self-talk. Target Date: 2022-12-23 Frequency: Biweekly  Progress: 0 Modality: individual  Related Interventions Explore the client's schema and self-talk that mediate his/her fear response; assist him/her in challenging the biases; replace the distorted messages with reality-based alternatives and positive, realistic self-talk that will increase his/her self-confidence in coping with irrational fears (see Cognitive Therapy of Anxiety Disorders by Alison Stalling). Assign the client a homework exercise in which he/she identifies fearful  self-talk, identifies biases in the self-talk, generates alternatives, and tests through behavioral experiments (or assign "Negative Thoughts Trigger Negative Feelings" in the Adult Psychotherapy Homework Planner by [facility]); review and reinforce success, providing corrective feedback toward improvement.  Diagnosis:Major depressive disorder, recurrent episode, moderate (HCC)  Generalized anxiety disorder  Plan:  -get The Anger Workbook and consider starting the workbook. -meet again on Monday, January 27, 2022 at [place], [facility]

## 2022-01-15 ENCOUNTER — Other Ambulatory Visit: Payer: Self-pay | Admitting: Family Medicine

## 2022-01-15 DIAGNOSIS — Z8669 Personal history of other diseases of the nervous system and sense organs: Secondary | ICD-10-CM

## 2022-01-24 ENCOUNTER — Encounter: Payer: Self-pay | Admitting: Family Medicine

## 2022-01-27 ENCOUNTER — Ambulatory Visit: Payer: BC Managed Care – PPO | Admitting: Professional

## 2022-01-27 ENCOUNTER — Ambulatory Visit: Payer: BC Managed Care – PPO | Admitting: Family Medicine

## 2022-01-27 ENCOUNTER — Encounter: Payer: Self-pay | Admitting: Family Medicine

## 2022-01-27 VITALS — BP 140/90 | HR 91 | Temp 98.1°F | Ht 59.0 in | Wt 206.8 lb

## 2022-01-27 DIAGNOSIS — T148XXA Other injury of unspecified body region, initial encounter: Secondary | ICD-10-CM | POA: Diagnosis not present

## 2022-01-27 DIAGNOSIS — L282 Other prurigo: Secondary | ICD-10-CM | POA: Diagnosis not present

## 2022-01-27 MED ORDER — TRIAMCINOLONE ACETONIDE 0.1 % EX CREA: 1.0000 | TOPICAL_CREAM | Freq: Two times a day (BID) | CUTANEOUS | 0 refills | Status: DC

## 2022-01-27 NOTE — Telephone Encounter (Signed)
Called Pt app Scheduled for 01/27/2022
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
## 2022-02-10 ENCOUNTER — Ambulatory Visit (INDEPENDENT_AMBULATORY_CARE_PROVIDER_SITE_OTHER): Payer: BC Managed Care – PPO | Admitting: Professional

## 2022-02-10 ENCOUNTER — Encounter: Payer: Self-pay | Admitting: Professional

## 2022-02-10 DIAGNOSIS — F331 Major depressive disorder, recurrent, moderate: Secondary | ICD-10-CM

## 2022-02-10 DIAGNOSIS — F411 Generalized anxiety disorder: Secondary | ICD-10-CM | POA: Diagnosis not present
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
## 2022-02-11 ENCOUNTER — Other Ambulatory Visit: Payer: Self-pay | Admitting: Family Medicine

## 2022-02-17 ENCOUNTER — Other Ambulatory Visit: Payer: Self-pay | Admitting: Family

## 2022-02-17 DIAGNOSIS — F411 Generalized anxiety disorder: Secondary | ICD-10-CM

## 2022-02-22 ENCOUNTER — Ambulatory Visit
Admission: EM | Admit: 2022-02-22 | Discharge: 2022-02-22 | Disposition: A | Discharge status: Home / Self Care | Payer: BC Managed Care – PPO | Attending: Physician Assistant | Admitting: Physician Assistant

## 2022-02-22 DIAGNOSIS — M79672 Pain in left foot: Secondary | ICD-10-CM

## 2022-02-22 MED ORDER — DEXAMETHASONE SODIUM PHOSPHATE 10 MG/ML IJ SOLN
10.0000 mg | Freq: Once | INTRAMUSCULAR | Status: AC
  Administered 2022-02-22: 10 mg via INTRAMUSCULAR

## 2022-02-22 NOTE — Discharge Instructions (Addendum)
Continue ice to foot 15 minutes 4 times per day Keep foot elevated

## 2022-02-22 NOTE — ED Triage Notes (Signed)
Pt states she woke yesterday with pain/swelling to left foot-denies injury-NAD-slow gait

## 2022-02-22 NOTE — ED Provider Notes (Signed)
[facility]    CSN: [id number] Arrival date & time: 02/22/22  [id number]      History   Chief Complaint Chief Complaint  Patient presents with   Foot Pain    HPI Deborah Watts is a 56 y.o. female.   Patient here concerned with L foot pain x several days.  No injury, fall or trauma.  She recently d/c NSAIDS due to impeding spinal fusion surgery, which she was taking for arthritis.  She admits pain, swelling medial aspect L foot.  No prior history of gout.  She is having difficulty walking due to the pain.      Past Medical History:  Diagnosis Date   Anxiety    Arthritis    Asthma    Headache    migraines   History of hiatal hernia    repaired 30 yrs ago in [place]   Hypertension    MHA (microangiopathic hemolytic anemia) (HCC)     Patient Active Problem List   Diagnosis Date Noted   Major depressive disorder, recurrent episode, moderate ([place]) 11/25/2021   Generalized anxiety disorder 11/25/2021   Depression with anxiety 10/28/2021   Asthma, chronic, unspecified asthma severity, uncomplicated 0000000   Loud snoring 11/28/2019   Daytime sleepiness 11/28/2019   Other fatigue 11/28/2019   Primary osteoarthritis of left knee 08/02/2019   Rib pain 10/19/2018   Right knee pain 04/12/2017   Preventative health care 03/25/2016   Left ankle pain 01/14/2016   Sinusitis, acute 12/22/2014   Left leg pain 04/28/2014   Right calf pain 04/20/2014   Acute bacterial sinusitis 04/14/2014   Obesity (BMI 30-39.9) 01/06/2013   Influenza 03/10/2011   Thrush, oral 03/04/2011   TINEA CRURIS 02/05/2010   HYPOKALEMIA 04/26/2009   Primary hypertension 02/08/2008   LOW BACK PAIN SYNDROME 05/26/2007   LEG CRAMPS 05/14/2007   EDEMA 05/14/2007   Chronic migraine without aura, with status migrainosus 05/01/2007   ASTHMATIC BRONCHITIS, ACUTE 10/15/2006   Anxiety state 04/20/2006   Asthma with acute exacerbation 04/20/2006   RESTLESS LEG SYNDROME, HX OF 04/20/2006    Past  Surgical History:  Procedure Laterality Date   APPENDECTOMY N/A    LAPAROSCOPIC CHOLECYSTECTOMY N/A 03/1992   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   [facility]   RE[facility]ACEMENT TOTAL KNEE Right 01/15/2021   [facility]   TOTAL ABDOMINAL HYSTERECTOMY Right    TUBAL LIGATION     WRIST ARTHROSCOPY Right 02/12/2018   Procedure: Right wrist arthroscopy, evaluation under anesthesia with debridement and repair as necessary and right carpal tunnel release;  Surgeon: Roseanne Kaufman, MD;  Location: [facility];  Service: Orthopedics;  Laterality: Right;  90 mins    OB History   No obstetric history on file.      Home Medications    Prior to Admission medications   Medication Sig Start Date End Date Taking? Authorizing Provider  albuterol (VENTOLIN HFA) 108 (90 Base) MCG/ACT inhaler Inhale 2 puffs into the lungs every 6 (six) hours as needed for wheezing or shortness of breath (Cough). 10/12/21   Lynden Oxford Scales, PA-C  ALPRAZolam Duanne Moron) 0.5 MG tablet TAKE 1 TABLET BY MOUTH THREE TIMES PER DAY AS NEEDED 12/20/21   Dutch Quint B, FNP  BREO ELLIPTA 100-25 MCG/ACT AEPB Inhale 1 puff into the lungs daily. Patient not taking: Reported on 01/27/2022 05/13/21   Colon Branch, MD  buPROPion (WELLBUTRIN XL) 150 MG 24 hr tablet Take 2 tablets (300 mg total) by mouth daily. 12/20/21  Carollee Herter, Yvonne R, DO  diclofenac (VOLTAREN) 75 MG EC tablet TAKE 1 TABLET BY MOUTH TWICE DAILY 12/18/21   Carollee Herter, Alferd Apa, DO  escitalopram (LEXAPRO) 20 MG tablet Take 1 tablet (20 mg total) by mouth daily. 05/02/21   Roma Schanz R, DO  fluticasone (FLONASE) 50 MCG/ACT nasal spray Place 2 sprays into both nostrils daily. 01/06/22   Perlie Mayo, NP  fluticasone-salmeterol (ADVAIR) 100-50 MCG/ACT AEPB INHALE 2 DOSES BY MOUTH TWICE DAILY 02/11/22   Carollee Herter, Yvonne R, DO  ipratropium (ATROVENT) 0.03 % nasal spray Place 2 sprays into both nostrils every 12 (twelve) hours. 10/11/21   Mar Daring, PA-C   Meclizine HCl (TRAVEL SICKNESS) 25 MG CHEW CHEW 1 TABLET EVERY 6 HOURS AS NEEDED 09/12/21   Carollee Herter, Alferd Apa, DO  montelukast (SINGULAIR) 10 MG tablet Take 1 tablet (10 mg total) by mouth at bedtime. 05/02/21   Ann Held, DO  nystatin (MYCOSTATIN) 100000 UNIT/ML suspension Take 5 mLs (500,000 Units total) by mouth 4 (four) times daily. Patient not taking: Reported on 01/27/2022 01/27/19   Roma Schanz R, DO  pregabalin (LYRICA) 200 MG capsule Take 200 mg by mouth in the morning and at bedtime. 10/09/21   [provider]  SUMAtriptan (IMITREX) 100 MG tablet TAKE 1 TABLET BY MOUTH AS NEEDED FOR MIGRAINE. REPEAT IN 2 HOURS IF NEEDED 01/15/22   Carollee Herter, Alferd Apa, DO  topiramate (TOPAMAX) 50 MG tablet Take 1 tablet (50 mg total) by mouth 2 (two) times daily. 05/02/21   Ann Held, DO  traMADol (ULTRAM) 50 MG tablet Take 1 tablet (50 mg total) by mouth every 8 (eight) hours as needed. 12/18/21   Roma Schanz R, DO  triamcinolone cream (KENALOG) 0.1 % Apply 1 Application topically 2 (two) times daily. 01/27/22   Terrilyn Saver, NP    Family History Family History  Problem Relation Age of Onset   Throat cancer Father    Kidney failure Father    Asthma Other    Diabetes Other    Hyperlipidemia Other     Social History Social History   Tobacco Use   Smoking status: Never   Smokeless tobacco: Never  Vaping Use   Vaping Use: Never used  Substance Use Topics   Alcohol use: Yes    Alcohol/week: 0.0 standard drinks of alcohol    Comment: occassional   Drug use: No     Allergies   Fish allergy, Penicillins, Tizanidine hcl, Iohexol, Ivp dye [iodinated contrast media], and Morphine and related   Review of Systems Review of Systems  Constitutional:  Negative for chills, fatigue and fever.  Gastrointestinal:  Negative for nausea and vomiting.  Musculoskeletal:  Positive for arthralgias, gait problem, joint swelling and myalgias.  Skin:   Negative for color change and wound.  Neurological:  Negative for weakness and numbness.  Psychiatric/Behavioral:  Negative for sleep disturbance.      Physical Exam Triage Vital Signs ED Triage Vitals  Enc Vitals Group     BP 02/22/22 0938 (!) 143/84     Pulse Rate 02/22/22 0938 83     Resp 02/22/22 0938 20     Temp 02/22/22 0938 99 F (37.2 C)     Temp Source 02/22/22 0938 Oral     SpO2 02/22/22 0938 95 %     Weight --      Height --      Head Circumference --  Peak Flow --      Pain Score 02/22/22 0939 9     Pain Loc --      Pain Edu? --      Excl. in GC? --    No data found.  Updated Vital Signs BP (!) 143/84 (BP Location: Right Arm)   Pulse 83   Temp 99 F (37.2 C) (Oral)   Resp 20   SpO2 95%   Visual Acuity Right Eye Distance:   Left Eye Distance:   Bilateral Distance:    Right Eye Near:   Left Eye Near:    Bilateral Near:     Physical Exam Vitals and nursing note reviewed.  Constitutional:      General: She is not in acute distress.    Appearance: Normal appearance. She is not ill-appearing.  HENT:     Head: Normocephalic and atraumatic.  Eyes:     General: No scleral icterus.    Extraocular Movements: Extraocular movements intact.     Conjunctiva/sclera: Conjunctivae normal.  Pulmonary:     Effort: Pulmonary effort is normal. No respiratory distress.  Musculoskeletal:     Cervical back: Normal range of motion. No rigidity.     Left foot: Normal range of motion and normal capillary refill. Swelling (medially) and tenderness (diffuse medial aspect) present. No bony tenderness or crepitus.  Skin:    Coloration: Skin is not jaundiced.     Findings: No rash.  Neurological:     General: No focal deficit present.     Mental Status: She is alert and oriented to person, place, and time.     Motor: No weakness.     Gait: Gait normal.  Psychiatric:        Mood and Affect: Mood normal.        Behavior: Behavior normal.      UC Treatments /  Results  Labs (all labs ordered are listed, but only abnormal results are displayed) Labs Reviewed - No data to display  EKG   Radiology No results found.  Procedures Procedures (including critical care time)  Medications Ordered in UC Medications  dexamethasone (DECADRON) injection 10 mg (has no administration in time range)    Initial Impression / Assessment and Plan / UC Course  I have reviewed the triage vital signs and the nursing notes.  Pertinent labs & imaging results that were available during my care of the patient were reviewed by me and considered in my medical decision making (see chart for details).     Follow up with PCP Wear ace wrap as discussed Keep foot elevated Rest foot Final Clinical Impressions(s) / UC Diagnoses   Final diagnoses:  Left foot pain     Discharge Instructions      Continue ice to foot 15 minutes 4 times per day Keep foot elevated    ED Prescriptions   None    PDMP not reviewed this encounter.   Evern Core, PA-C 02/22/22 1028

## 2022-02-24 ENCOUNTER — Ambulatory Visit: Payer: BC Managed Care – PPO | Admitting: Professional

## 2022-02-24 DIAGNOSIS — M79672 Pain in left foot: Secondary | ICD-10-CM | POA: Diagnosis not present

## 2022-02-25 DIAGNOSIS — M48062 Spinal stenosis, lumbar region with neurogenic claudication: Secondary | ICD-10-CM | POA: Diagnosis not present

## 2022-02-26 DIAGNOSIS — M47816 Spondylosis without myelopathy or radiculopathy, lumbar region: Secondary | ICD-10-CM | POA: Diagnosis not present

## 2022-02-26 DIAGNOSIS — M48062 Spinal stenosis, lumbar region with neurogenic claudication: Secondary | ICD-10-CM | POA: Diagnosis not present

## 2022-02-26 DIAGNOSIS — G4730 Sleep apnea, unspecified: Secondary | ICD-10-CM | POA: Diagnosis not present

## 2022-02-26 DIAGNOSIS — M1990 Unspecified osteoarthritis, unspecified site: Secondary | ICD-10-CM | POA: Diagnosis not present

## 2022-02-26 DIAGNOSIS — M5416 Radiculopathy, lumbar region: Secondary | ICD-10-CM | POA: Diagnosis not present

## 2022-02-26 DIAGNOSIS — I10 Essential (primary) hypertension: Secondary | ICD-10-CM | POA: Diagnosis not present

## 2022-02-26 DIAGNOSIS — Z6841 Body Mass Index (BMI) 40.0 and over, adult: Secondary | ICD-10-CM | POA: Diagnosis not present

## 2022-03-05 ENCOUNTER — Other Ambulatory Visit: Payer: Self-pay | Admitting: Family

## 2022-03-05 ENCOUNTER — Encounter: Payer: Self-pay | Admitting: Family Medicine

## 2022-03-05 ENCOUNTER — Other Ambulatory Visit: Payer: Self-pay | Admitting: Family Medicine

## 2022-03-05 DIAGNOSIS — F411 Generalized anxiety disorder: Secondary | ICD-10-CM

## 2022-03-05 DIAGNOSIS — R109 Unspecified abdominal pain: Secondary | ICD-10-CM

## 2022-03-05 DIAGNOSIS — M170 Bilateral primary osteoarthritis of knee: Secondary | ICD-10-CM

## 2022-03-05 MED ORDER — DICLOFENAC SODIUM 75 MG PO TBEC: 75.0000 mg | DELAYED_RELEASE_TABLET | Freq: Two times a day (BID) | ORAL | 1 refills | Status: DC

## 2022-03-05 NOTE — Telephone Encounter (Signed)
Requesting: tramadol 50mg   Contract: 05/02/21 UDS: 05/02/21 Last Visit: 10/28/21 Next Visit: 05/05/22 Last Refill: 12/18/21 #30 and 0RF  Please Advise

## 2022-03-05 NOTE — Telephone Encounter (Signed)
Requesting: alprazolam 0.5mg  Contract: 05/02/21 UDS: 05/02/21 Last Visit: 10/28/21 Next Visit: 05/05/22 Last Refill: 12/20/21 #90 and 1rf Pt sig: 1 tab tid prn   Please Advise

## 2022-03-07 MED ORDER — ALPRAZOLAM 0.5 MG PO TABS: ORAL_TABLET | ORAL | 1 refills | Status: DC

## 2022-03-07 MED ORDER — TRAMADOL HCL 50 MG PO TABS: 50.0000 mg | ORAL_TABLET | Freq: Three times a day (TID) | ORAL | 0 refills | Status: DC | PRN

## 2022-03-12 ENCOUNTER — Telehealth: Payer: Self-pay

## 2022-03-12 NOTE — Telephone Encounter (Signed)
This medication or product is on your plan's list of covered drugs. Prior authorization is not required at this time. If your pharmacy has questions regarding the processing of your prescription, please have them call the [facility] pharmacy help desk at [phone]. **Please note: This request was submitted electronically. Formulary lowering, tiering exception, cost reduction and/or pre-benefit determination review (including prospective Medicare hospice reviews) requests cannot be requested using this method of submission. Providers contact us at [phone] for further assistance.

## 2022-03-12 NOTE — Telephone Encounter (Signed)
PA initiated via Covermymeds; KEY: [id number]. Awaiting determination.

## 2022-03-16 DIAGNOSIS — S61451A Open bite of right hand, initial encounter: Secondary | ICD-10-CM | POA: Diagnosis not present

## 2022-03-18 ENCOUNTER — Other Ambulatory Visit: Payer: Self-pay | Admitting: Family Medicine

## 2022-03-18 DIAGNOSIS — Z8669 Personal history of other diseases of the nervous system and sense organs: Secondary | ICD-10-CM

## 2022-03-19 ENCOUNTER — Ambulatory Visit: Payer: BC Managed Care – PPO | Admitting: Family Medicine

## 2022-03-19 ENCOUNTER — Encounter: Payer: Self-pay | Admitting: Family Medicine

## 2022-03-19 VITALS — BP 147/86 | HR 100 | Temp 98.8°F | Resp 16 | Ht 59.0 in | Wt 222.0 lb

## 2022-03-19 DIAGNOSIS — S61402A Unspecified open wound of left hand, initial encounter: Secondary | ICD-10-CM | POA: Diagnosis not present

## 2022-03-19 DIAGNOSIS — Z23 Encounter for immunization: Secondary | ICD-10-CM

## 2022-03-19 DIAGNOSIS — W5501XA Bitten by cat, initial encounter: Secondary | ICD-10-CM

## 2022-03-19 MED ORDER — FLUCONAZOLE 150 MG PO TABS: 150.0000 mg | ORAL_TABLET | Freq: Every day | ORAL | 0 refills | Status: DC

## 2022-03-19 MED ORDER — METRONIDAZOLE 500 MG PO TABS: 500.0000 mg | ORAL_TABLET | Freq: Three times a day (TID) | ORAL | 0 refills | Status: AC

## 2022-03-19 NOTE — Progress Notes (Signed)
Acute Office Visit  Subjective:     Patient ID: Deborah Watts, female    DOB: May 17, 1965, 57 y.o.   MRN: [id number]  Chief Complaint  Patient presents with   Cellulitis    Left hand    HPI Patient is in today for cat bite with infection of left hand.   Patient states that her car bit her on 03/14/22. She went to urgent care on 03/16/22 because of the pain, redness, swelling around the site. They started her on doxycycline, keflex, and Toradol. Reports only minimal improvement since then. She still having 7/10 discomfort, but swelling and redness have very slowly started improving.   Last Td was in 2015.   Reports cat was an inside cat and not around other animals, no signs of rabies.      ROS All review of systems negative except what is listed in the HPI      Objective:    BP (!) 147/86   Pulse 100   Temp 98.8 F (37.1 C)   Resp 16   Ht 4\' 11"  (1.499 m)   Wt 222 lb (100.7 kg)   SpO2 100%   BMI 44.84 kg/m    Physical Exam Vitals reviewed.  Constitutional:      Appearance: Normal appearance.  Musculoskeletal:        General: Swelling and tenderness present.  Skin:    General: Skin is warm.     Findings: Erythema and rash present.     Comments: See pictures  Neurological:     Mental Status: She is alert and oriented to person, place, and time.  Psychiatric:        Mood and Affect: Mood normal.        Behavior: Behavior normal.        Thought Content: Thought content normal.        Judgment: Judgment normal.    Today:    Picture she took on 03/16/22:     No results found for any visits on 03/19/22.      Assessment & Plan:   Problem List Items Addressed This Visit   None Visit Diagnoses     Cat bite, initial encounter    -  Primary Based on guidelines, changing your Keflex to Flagyl (do not use any alcohol while on this or for 3 days after finishing antibiotics), continue the doxycycline.  I'm going to go ahead and get an xray of your  hand. Tetanus shot was more than 5 years ago so it is recommended to repeat. Regarding rabies, based on guidelines, if the cat can be quarantined and remains healthy 10 days from now, you do not need rabies postexposure prophylactic treatment.  Please follow-up in 2-4 days for wound check.  Adding Diflucan for possible yeast infection with all of these antibiotics.   Please contact office for sooner follow-up if symptoms do not improve or worsen. Seek emergency care if symptoms become severe.       Relevant Medications   metroNIDAZOLE (FLAGYL) 500 MG tablet   Other Relevant Orders   DG Hand Complete Left   Tdap vaccine greater than or equal to 7yo IM (Completed)       Meds ordered this encounter  Medications   metroNIDAZOLE (FLAGYL) 500 MG tablet    Sig: Take 1 tablet (500 mg total) by mouth 3 (three) times daily for 10 days.    Dispense:  30 tablet    Refill:  0   fluconazole (DIFLUCAN)  150 MG tablet    Sig: Take 1 tablet (150 mg total) by mouth daily. May repeat in 3 days if needed.    Dispense:  2 tablet    Refill:  0    Return for wound check 3-4 days .  Terrilyn Saver, NP

## 2022-03-19 NOTE — Patient Instructions (Addendum)
Based on guidelines, changing your Keflex to Flagyl (do not use any alcohol while on this or for 3 days after finishing antibiotics), continue the doxycycline.  I'm going to go ahead and get an xray of your hand. Tetanus shot was more than 5 years ago so it is recommended to repeat. Regarding rabies, based on guidelines, if the cat can be quarantined and remains healthy 10 days from now, you do not need rabies postexposure prophylactic treatment.  Please follow-up in 2-4 days for wound check.  Adding Diflucan for possible yeast infection with all of these antibiotics.

## 2022-03-21 ENCOUNTER — Ambulatory Visit: Payer: BC Managed Care – PPO | Admitting: Family Medicine

## 2022-03-21 ENCOUNTER — Encounter: Payer: Self-pay | Admitting: Family Medicine

## 2022-03-21 VITALS — BP 130/86 | HR 80 | Temp 98.4°F | Resp 16 | Ht 59.0 in | Wt 222.0 lb

## 2022-03-21 DIAGNOSIS — W5501XD Bitten by cat, subsequent encounter: Secondary | ICD-10-CM

## 2022-03-21 DIAGNOSIS — S61402D Unspecified open wound of left hand, subsequent encounter: Secondary | ICD-10-CM

## 2022-03-21 NOTE — Progress Notes (Signed)
   Acute Office Visit  Subjective:     Patient ID: Deborah Watts, female    DOB: 1966-03-11, 57 y.o.   MRN: [id number]  Chief Complaint  Patient presents with   Wound Check     Patient is here for recheck of her cat bite/cellulitis.   She is feeling significant improvement. Redness and swelling has started going down. Pain is now 6/10, still taking the Toradol and tylenol for pain which is helping. No new complaints.     ROS All review of systems negative except what is listed in the HPI      Objective:    BP 130/86   Pulse 80   Temp 98.4 F (36.9 C)   Resp 16   Ht 4\' 11"  (1.499 m)   Wt 222 lb (100.7 kg)   SpO2 96%   BMI 44.84 kg/m    Physical Exam Vitals reviewed.  Constitutional:      Appearance: Normal appearance.  Skin:    Comments: Hand looking significantly better, mild erythema and moderate edema remains, no warmth, weeping or drainage. See picture  Neurological:     General: No focal deficit present.     Mental Status: She is alert and oriented to person, place, and time.  Psychiatric:        Mood and Affect: Mood normal.        Behavior: Behavior normal.        Thought Content: Thought content normal.        Judgment: Judgment normal.       No results found for any visits on 03/21/22.      Assessment & Plan:   Problem List Items Addressed This Visit   None Visit Diagnoses     Cat bite, subsequent encounter    -  Primary Making significant improvement. No new concerns.  Finish out doxy and Flagyl as prescribed  Patient aware of signs/symptoms requiring further/urgent evaluation.        No orders of the defined types were placed in this encounter.   Return if symptoms worsen or fail to improve.  Terrilyn Saver, NP

## 2022-03-24 ENCOUNTER — Ambulatory Visit: Payer: BC Managed Care – PPO | Admitting: Professional

## 2022-04-07 ENCOUNTER — Ambulatory Visit (INDEPENDENT_AMBULATORY_CARE_PROVIDER_SITE_OTHER): Payer: BC Managed Care – PPO | Admitting: Professional

## 2022-04-07 ENCOUNTER — Encounter: Payer: Self-pay | Admitting: Professional

## 2022-04-07 DIAGNOSIS — F411 Generalized anxiety disorder: Secondary | ICD-10-CM | POA: Diagnosis not present

## 2022-04-07 DIAGNOSIS — F331 Major depressive disorder, recurrent, moderate: Secondary | ICD-10-CM

## 2022-04-07 NOTE — Progress Notes (Signed)
[facility] Counselor/Therapist Progress Note  Patient ID: Deborah Watts, MRN: [id number],    Date: 04/07/2022  Time Spent: 25 minutes 901-926am  Treatment Type: Individual Therapy  Risk Assessment: Danger to Self:  No Self-injurious Behavior: No Danger to Others: No  Subjective:  This session was held via video teletherapy The patient consented to video teletherapy and was located at her home during this session. She is aware it is the responsibility of the patient to secure confidentiality on her end of the session. The provider was in a private home office for the duration of this session.    The patient arrived on time for her webex session.   1-back surgery -notices a positive impact -minor complication two days prior when cat bit her hand and she got cellulitis -she is walking about twenty minutes per day 2-work a-not back to work b-goes back to see Dr. 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 Acquire and utilize the necessary pain management skills. 2. Become capable of handling angry feelings in constructive ways that enhance daily functioning. 3. Come to an awareness and acceptance of angry feelings while developing better control and more serenity. 4. Decrease the frequency, intensity, and duration of angry thoughts, feelings, and actions and increase the ability to  recognize and respectfully express frustration and resolve conflict. 5. Demonstrate respect for others and their feelings. Objective Keep a daily journal of persons, situations, and other triggers of anger; record thoughts, feelings, and actions taken. Target Date: 2022-12-23 Frequency: Biweekly  Progress: 0 Modality: individual  Related Interventions Ask the client to self-monitor, keeping a daily journal in which he/she documents persons, situations, thoughts, feelings, and actions associated with moments of anger, irritation, or disappointment (or assign "Anger Journal" in the Adult Psychotherapy Homework Planner by Stephannie Li); routinely process the journal toward helping the client understand his/her contributions to generating his/her anger. Assist the client in generating a list of anger triggers; process the list toward helping the client understand the causes and expressions of his/her anger. Objective Verbalize increased awareness of anger expression patterns, their causes, and their consequences. Target Date: 2022-12-23 Frequency: Biweekly  Progress: 45 Modality: individual  Related Interventions Assist the client in re-conceptualizing anger as involving different dimensions (cognitive, physiological, affective, and behavioral) that interact predictably (e.g., demanding expectations not being met leading to increased arousal and anger leading to acting out) and that can be understood, challenged, and changed. Process the client's list of anger triggers and other relevant journal information toward helping the client understand how cognitive, physiological, and affective factors interplay to produce anger. Ask the client to list and discuss ways anger has negatively impacted his/her daily life (e.g., hurting others or self, legal conflicts, loss of respect from self and others, destruction of property); process this list. Assist the client in identifying the positive consequences of managing  anger (e.g., respect from others and  self, cooperation from others, improved physical health, etc.) (or assign "Alternatives to Destructive Anger" in the Adult Psychotherapy Homework Planner by Bryn Gulling). Objective Read a book or treatment manual that supplements the therapy by improving understanding of anger and anger control problems. Target Date: 2022-12-23 Frequency: Biweekly  Progress: 60 Modality: individual  Related Interventions Assign the client reading material that educates him/her about anger and its management (e.g., Overcoming Situational and General Anger: Client Manual by Dolan Amen and Feliciana Rossetti; Of Course You're Angry by Rosselini and Rutha Bouchard; The Anger Control Workbook by Aggie Moats; Anger Management for Everyone by Leary Roca and Tafrate); process and revisit relevant themes throughout therapy to help the client consolidate his/her understanding of the treatment. Objective Learn and implement calming and coping strategies as part of an overall approach to managing anger. Target Date: 2022-12-23 Frequency: Biweekly  Progress: 80 Modality: individual  Related Interventions Teach the client calming techniques (e.g., progressive muscle relaxation, breathing induced relaxation, calming imagery, cue-controlled relaxation, applied relaxation, mindful breathing) as part of a tailored strategy for reducing chronic and acute physiological tension that accompanies the escalation of his/her angry feelings. Objective Identify, challenge, and replace anger-inducing self-talk with self-talk that facilitates a less angry reaction. Target Date: 2022-12-23 Frequency: Biweekly  Progress: 45 Modality: individual  Related Interventions Explore the client's self-talk that mediates his/her angry feelings and actions (e.g., demanding expectations reflected in should, must, or have-to statements); identify and challenge biases, assisting him/her in generating appraisals and self-talk that corrects for  the biases and facilitates a more flexible and temperate response to frustration. Combine new self-talk with calming skills as part of a set of coping skills to manage anger. Role-play the use of relaxation and cognitive coping to visualized anger-provoking scenes, moving from low- to high-anger scenes. Assign the implementation of calming techniques in his/her daily life and when facing anger-triggering situations; process the results, reinforcing success and problem-solving obstacles. Assign the client a homework exercise in which he/she identifies angry self-talk and generates alternatives that help moderate angry reactions; review; reinforce success, providing corrective feedback toward improvement. Objective Learn and implement thought-stopping to manage intrusive unwanted thoughts that trigger anger. Target Date: 2022-12-23 Frequency: Biweekly  Progress: 80 Modality: individual  Related Interventions Assign the client to implement a "thought-stopping" technique in which he/she shouts STOP to himself/herself in his/her mind and then replaces the thought with an alternative that is calming (or assign "Making Use of the Thought-Stopping Technique" in the Adult Psychotherapy Homework Planner by [facility]); review implantation, reinforcing success and providing corrective feedback for failure. Objective Verbalize an understanding of assertive communication and how it can be used to express thoughts and feelings of anger in a controlled, respectful way. Target Date: 2022-12-23 Frequency: Biweekly  Progress: 30 Modality: individual  Related Interventions Use instruction, modeling, and/or role-playing to teach the client the distinctive elements as well as the pros and cons of assertive, unassertive (passive), and aggressive communication. 6. Develop an awareness of angry thoughts, feelings, and actions, clarifying origins of, and learning alternatives to aggressive anger. 7. Enhance ability to  effectively cope with the full variety of life's worries and anxieties. 8. Find an escape route from the pain. Objective Describe the nature of, history of, impact of, and understood causes of chronic pain. Target Date: 2022-12-23 Frequency: Biweekly  Progress: 100 Modality: individual  Related Interventions Assess the manifestation of chronic pain, its history, current status, triggers, and methods of coping (see The Handbook of Pain Assessment by Cleotilde Neer). Assess the impact of the  pain on the patient's functioning in everyday life, including changes in the client's mood, attitude, social, vocational, and familial/marital roles. Objective Identify and monitor specific pain triggers. Target Date: 2022-12-23 Frequency: Biweekly  Progress: 100 Modality: individual  Related Interventions Teach the client self-monitoring of his/her symptoms; ask the client to keep a pain journal that records time of day, where and what he/she was doing, the severity of stress at the time, the severity of, and what was done to alleviate the pain (or assign "Pain and Stress Journal" in the Adult Psychotherapy Homework Planner by [facility]); process the journal with the client to increase understanding of the nature of the pain, cognitive, affective, and behavioral triggers, and the positive or negative effects of the coping strategies he/she is currently using. Objective Learn and implement calming skills such as relaxation, biofeedback, or mindfulness meditation to ease pain. Target Date: 2022-12-23 Frequency: Biweekly  Progress: 50 Modality: individual  Related Interventions Teach the client relaxation skills (e.g., progressive muscle relaxation, guided imagery, slow diaphragmatic breathing) or mindfulness meditation, explaining the rationale and how to apply these skills to his/her daily life (see New Directions in Progressive Muscle Relaxation by Marcelyn Ditty, and Hazlett-Stevens). Objective Learn  mental coping skills and implement with somatic skills for managing acute pain. Target Date: 2022-12-23 Frequency: Biweekly  Progress: 50 Modality: individual  Related Interventions Teach the client distraction techniques (e.g., pleasant imagery, counting techniques, alternative focal point) and how to use them with relaxation skills for the management of acute episodes of pain (or assign "Controlling the Focus on Physical Problems" in the Adult Psychotherapy Homework Planner by Stephannie Li). 9. Find relief from pain and build renewed contentment and joy in performing activities of everyday life. 10. Implement cognitive behavioral skills necessary to solve problems in a more constructive manner. 11. Improve satisfaction and comfort surrounding coworker relationships. 12. Increase job satisfaction and performance due to implementation of assertiveness and stress management strategies. 13. 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 Objective Describe the nature and history of the vocational stress. Target Date: 2022-12-23 Frequency: Biweekly  Progress: 80 Modality: individual  Related Interventions Assess the client's history of vocational stress including perceived sources, client distress and disability, adaptive and maladaptive coping actions, and goals of treatment. Objective Identify and implement behavioral changes that could be made in workplace interactions to help resolve conflicts with coworkers or supervisors. Target Date: 2022-12-23 Frequency: Biweekly  Progress: 50 Modality: individual  Related Interventions Assign the client to write a plan for constructive action (e.g., polite compliance with directedness, initiate a smiling greeting, compliment others' work, avoid critical judgments) that contains various alternatives to coworker or supervisor conflict. Use role-playing, behavioral rehearsal, and role rehearsal to increase the  client's probability of positive encounters and to reduce anxiety with others in employment situation or job search (recommend Working Anger: Preventing and Resolving Conflict on the Job by [employer]). Objective Implement assertiveness skills. Target Date: 2022-12-23 Frequency: Biweekly  Progress: 50 Modality: individual  Related Interventions Train the client in assertiveness skills or refer to assertiveness training class that teaches effective communication of needs and feelings without aggression or defensiveness. Objective Learn and implement problem-solving skills. Target Date: 2022-12-23 Frequency: Biweekly  Progress: 50 Modality: individual  Related Interventions Conduct Problem-Solving Therapy (see Problem-Solving Therapy by Simonne Maffucci) using techniques such as psychoeducation, modeling, and role-playing to teach the client problem-solving skills (i.e., defining a problem specifically, generating possible solutions, evaluating the pros and cons of each solution,  selecting and implementing a plan of action, evaluating the efficacy of the plan, accepting or revising the plan); role-play application of the problem-solving skill to a real life issue (or assign "Applying Problem-Solving to Interpersonal Conflict" in the Adult Psychotherapy Homework Planner by Stephannie Li). Objective Learn and implement calming skills to reduce overall anxiety and manage anxiety symptoms. Target Date: 2022-12-23 Frequency: Biweekly  Progress: 50 Modality: individual  Objective Identify own role in the conflict with coworkers or Merchandiser, retail. Target Date: 2022-12-23 Frequency: Biweekly  Progress: 100 Modality: individual  Related Interventions Clarify the nature of the client's conflicts in the work setting. Help the client identify his/her own role in the conflict, attempting to represent the other party's point of view. 14. Increase respectful communication through the use of assertiveness and  conflict resolution skills. 15. Learn and implement anger management skills to reduce the level of anger and irritability that accompanies it. 16. Learn and implement coping skills that result in a reduction of anxiety and worry, and improved daily functioning. 17. Lessen daily suffering from pain. 18. Reduce overall frequency, intensity, and duration of the anxiety so that daily functioning is not impaired. 19. Resolve the core conflict that is the source of anxiety. 20. Stabilize anxiety level while increasing ability to function on a daily basis. Objective Describe situations, thoughts, feelings, and actions associated with anxieties and worries, their impact on functioning, and attempts to resolve them. Target Date: 2022-12-23 Frequency: Biweekly  Progress: 50 Modality: individual  Related Interventions Ask the client to describe his/her past experiences of anxiety and their impact on functioning; assess the focus, excessiveness, and uncontrollability of the worry and the type, frequency, intensity, and duration of his/her anxiety symptoms (consider using a structured interview such as The Anxiety Disorders Interview Schedule-Adult Version). Objective Verbalize an understanding of the cognitive, physiological, and behavioral components of anxiety and its treatment. Target Date: 2022-12-23 Frequency: Biweekly  Progress: 0 Modality: individual  Related Interventions Discuss how generalized anxiety typically involves excessive worry about unrealistic threats, various bodily expressions of tension, overarousal, and hypervigilance, and avoidance of what is threatening that interact to maintain the problem (see Mastery of Your Anxiety and Worry: Therapist Guide by Renard Matter, and [place]; Treating Generalized Anxiety Disorder by Rygh and Ida Rogue). Discuss how treatment targets worry, anxiety symptoms, and avoidance to help the client manage worry effectively, reduce overarousal, and eliminate  unnecessary avoidance. Objective Learn and implement calming skills to reduce overall anxiety and manage anxiety symptoms. Target Date: 2022-12-23 Frequency: Biweekly  Progress: 50 Modality: individual  Related Interventions Teach the client calming/relaxation skills (e.g., applied relaxation, progressive muscle relaxation, cue controlled relaxation; mindful breathing; biofeedback) and how to discriminate better between relaxation and tension; teach the client how to apply these skills to his/her daily life (e.g., New Directions in Progressive Muscle Relaxation by Marcelyn Ditty, and Hazlett-Stevens; Treating Generalized Anxiety Disorder by Rygh and Ida Rogue). Assign the client homework each session in which he/she practices relaxation exercises daily, gradually applying them progressively from non-anxiety-provoking to anxiety-provoking situations; review and reinforce success while providing corrective feedback toward improvement. Assign the client to read about progressive muscle relaxation and other calming strategies in relevant books or treatment manuals (e.g., Progressive Relaxation Training by Robb Matar and Alen Blew; Mastery of Your Anxiety and Worry: Workbook by Earlie Counts). Teach the client calming/relaxation skills (e.g., applied relaxation, progressive muscle relaxation, cue controlled relaxation, mindful breathing, biofeedback) and how to discriminate better between relaxation and tension; teach the client how to apply these skills to his/her daily  life (e.g., New Directions in Progressive Muscle Relaxation by Casper Harrison, and Hazlett-Stevens; The Relaxation and Stress Reduction Workbook by Caryl Pina, and [place]). Assign the client homework each session in which he/she practices relaxation exercises daily, gradually applying them progressively from non-anxiety-provoking to anxiety-provoking situations; review and reinforce success while providing corrective  feedback toward improvement. Objective Learn and implement a strategy to limit the association between various environmental settings and worry, delaying the worry until a designated "worry time." Target Date: 2022-12-23 Frequency: Biweekly  Progress: 0 Modality: individual  Related Interventions Explain the rationale for using a worry time as well as how it is to be used; agree upon and implement a worry time with the client. Teach the client how to recognize, stop, and postpone worry to the agreed upon worry time using skills such as thought stopping, relaxation, and redirecting attention (or assign "Making Use of the Thought-Stopping Technique" and/or "Worry Time" in the Adult Psychotherapy Homework Planner by Jongsma to assist skill development); encourage use in daily life; review and reinforce success while providing corrective feedback toward improvement. Objective Verbalize an understanding of the role that cognitive biases play in excessive irrational worry and persistent anxiety symptoms. Target Date: 2022-12-23 Frequency: Biweekly  Progress: 50 Modality: individual  Related Interventions Assist the client in analyzing his/her worries by examining potential biases such as the probability of the negative expectation occurring, the real consequences of it occurring, his/her ability to control the outcome, the worst possible outcome, and his/her ability to accept it (see "Analyze the Probability of a Feared Event" in the Adult Psychotherapy Homework Planner by Bryn Gulling; Cognitive Therapy of Anxiety Disorders by Alison Stalling). Objective Identify, challenge, and replace biased, fearful self-talk with positive, realistic, and empowering self-talk. Target Date: 2022-12-23 Frequency: Biweekly  Progress: 50 Modality: individual  Related Interventions Explore the client's schema and self-talk that mediate his/her fear response; assist him/her in challenging the biases; replace the distorted  messages with reality-based alternatives and positive, realistic self-talk that will increase his/her self-confidence in coping with irrational fears (see Cognitive Therapy of Anxiety Disorders by Alison Stalling). Assign the client a homework exercise in which he/she identifies fearful self-talk, identifies biases in the self-talk, generates alternatives, and tests through behavioral experiments (or assign "Negative Thoughts Trigger Negative Feelings" in the Adult Psychotherapy Homework Planner by [facility]); review and reinforce success, providing corrective feedback toward improvement.  Diagnosis:Major depressive disorder, recurrent episode, moderate (HCC)  Generalized anxiety disorder  Plan:  -meet again as needed; likely once she returns to work.

## 2022-04-25 ENCOUNTER — Other Ambulatory Visit (HOSPITAL_BASED_OUTPATIENT_CLINIC_OR_DEPARTMENT_OTHER): Payer: Self-pay | Admitting: Family Medicine

## 2022-04-25 DIAGNOSIS — Z1231 Encounter for screening mammogram for malignant neoplasm of breast: Secondary | ICD-10-CM

## 2022-05-05 ENCOUNTER — Encounter: Payer: Self-pay | Admitting: Family Medicine

## 2022-05-05 ENCOUNTER — Ambulatory Visit (INDEPENDENT_AMBULATORY_CARE_PROVIDER_SITE_OTHER): Payer: BC Managed Care – PPO | Admitting: Family Medicine

## 2022-05-05 VITALS — BP 122/86 | HR 88 | Temp 97.5°F | Resp 18 | Ht 59.0 in | Wt 222.4 lb

## 2022-05-05 DIAGNOSIS — J45909 Unspecified asthma, uncomplicated: Secondary | ICD-10-CM

## 2022-05-05 DIAGNOSIS — R109 Unspecified abdominal pain: Secondary | ICD-10-CM

## 2022-05-05 DIAGNOSIS — H8110 Benign paroxysmal vertigo, unspecified ear: Secondary | ICD-10-CM

## 2022-05-05 DIAGNOSIS — R232 Flushing: Secondary | ICD-10-CM

## 2022-05-05 DIAGNOSIS — F418 Other specified anxiety disorders: Secondary | ICD-10-CM

## 2022-05-05 DIAGNOSIS — I10 Essential (primary) hypertension: Secondary | ICD-10-CM

## 2022-05-05 DIAGNOSIS — Z0000 Encounter for general adult medical examination without abnormal findings: Secondary | ICD-10-CM

## 2022-05-05 DIAGNOSIS — J4521 Mild intermittent asthma with (acute) exacerbation: Secondary | ICD-10-CM

## 2022-05-05 DIAGNOSIS — E876 Hypokalemia: Secondary | ICD-10-CM

## 2022-05-05 LAB — COMPREHENSIVE METABOLIC PANEL
ALT: 30 U/L (ref 0–35)
AST: 22 U/L (ref 0–37)
Albumin: 4.1 g/dL (ref 3.5–5.2)
Alkaline Phosphatase: 80 U/L (ref 39–117)
BUN: 13 mg/dL (ref 6–23)
CO2: 30 mEq/L (ref 19–32)
Calcium: 9.6 mg/dL (ref 8.4–10.5)
Chloride: 105 mEq/L (ref 96–112)
Creatinine, Ser: 0.78 mg/dL (ref 0.40–1.20)
GFR: 84.06 mL/min (ref >60.00)
Glucose, Bld: 92 mg/dL (ref 70–99)
Potassium: 4.6 mEq/L (ref 3.5–5.1)
Sodium: 143 mEq/L (ref 135–145)
Total Bilirubin: 0.4 mg/dL (ref 0.2–1.2)
Total Protein: 6.8 g/dL (ref 6.0–8.3)

## 2022-05-05 LAB — LIPID PANEL
Cholesterol: 164 mg/dL (ref 0–200)
HDL: 35 mg/dL — ABNORMAL LOW (ref >39.00)
NonHDL: 128.84
Total CHOL/HDL Ratio: 5
Triglycerides: 233 mg/dL — ABNORMAL HIGH (ref 0.0–149.0)
VLDL: 46.6 mg/dL — ABNORMAL HIGH (ref 0.0–40.0)

## 2022-05-05 LAB — LDL CHOLESTEROL, DIRECT: Direct LDL: 101 mg/dL

## 2022-05-05 LAB — CBC WITH DIFFERENTIAL/PLATELET
Basophils Absolute: 0.1 10*3/uL (ref 0.0–0.1)
Basophils Relative: 0.9 % (ref 0.0–3.0)
Eosinophils Absolute: 0.7 10*3/uL (ref 0.0–0.7)
Eosinophils Relative: 6.8 % — ABNORMAL HIGH (ref 0.0–5.0)
HCT: 42.2 % (ref 36.0–46.0)
Hemoglobin: 13.9 g/dL (ref 12.0–15.0)
Lymphocytes Relative: 21.6 % (ref 12.0–46.0)
Lymphs Abs: 2.1 10*3/uL (ref 0.7–4.0)
MCHC: 33.1 g/dL (ref 30.0–36.0)
MCV: 86.7 fl (ref 78.0–100.0)
Monocytes Absolute: 0.7 10*3/uL (ref 0.1–1.0)
Monocytes Relative: 7.2 % (ref 3.0–12.0)
Neutro Abs: 6.1 10*3/uL (ref 1.4–7.7)
Neutrophils Relative %: 63.5 % (ref 43.0–77.0)
Platelets: 309 10*3/uL (ref 150.0–400.0)
RBC: 4.86 Mil/uL (ref 3.87–5.11)
RDW: 15.2 % (ref 11.5–15.5)
WBC: 9.5 10*3/uL (ref 4.0–10.5)

## 2022-05-05 LAB — TSH: TSH: 1 u[IU]/mL (ref 0.35–5.50)

## 2022-05-05 MED ORDER — ESCITALOPRAM OXALATE 20 MG PO TABS: 20.0000 mg | ORAL_TABLET | Freq: Every day | ORAL | 3 refills | Status: DC

## 2022-05-05 MED ORDER — MECLIZINE HCL 25 MG PO CHEW: CHEWABLE_TABLET | ORAL | 1 refills | Status: DC

## 2022-05-05 MED ORDER — MONTELUKAST SODIUM 10 MG PO TABS: 10.0000 mg | ORAL_TABLET | Freq: Every day | ORAL | 3 refills | Status: DC

## 2022-05-05 MED ORDER — TRAMADOL HCL 50 MG PO TABS: 50.0000 mg | ORAL_TABLET | Freq: Three times a day (TID) | ORAL | 0 refills | Status: DC | PRN

## 2022-05-05 MED ORDER — VEOZAH 45 MG PO TABS: 1.0000 | ORAL_TABLET | Freq: Every day | ORAL | 11 refills | Status: DC

## 2022-05-05 NOTE — Assessment & Plan Note (Signed)
Check labs 

## 2022-05-05 NOTE — Assessment & Plan Note (Signed)
Stable  Con't inhalers prn

## 2022-05-05 NOTE — Patient Instructions (Signed)
Preventive Care 40-58 Years Old, Female Preventive care refers to lifestyle choices and visits with your health care provider that can promote health and wellness. Preventive care visits are also called wellness exams. What can I expect for my preventive care visit? Counseling Your health care provider may ask you questions about your: Medical history, including: Past medical problems. Family medical history. Pregnancy history. Current health, including: Menstrual cycle. Method of birth control. Emotional well-being. Home life and relationship well-being. Sexual activity and sexual health. Lifestyle, including: Alcohol, nicotine or tobacco, and drug use. Access to firearms. Diet, exercise, and sleep habits. Work and work environment. Sunscreen use. Safety issues such as seatbelt and bike helmet use. Physical exam Your health care provider will check your: Height and weight. These may be used to calculate your BMI (body mass index). BMI is a measurement that tells if you are at a healthy weight. Waist circumference. This measures the distance around your waistline. This measurement also tells if you are at a healthy weight and may help predict your risk of certain diseases, such as type 2 diabetes and high blood pressure. Heart rate and blood pressure. Body temperature. Skin for abnormal spots. What immunizations do I need?  Vaccines are usually given at various ages, according to a schedule. Your health care provider will recommend vaccines for you based on your age, medical history, and lifestyle or other factors, such as travel or where you work. What tests do I need? Screening Your health care provider may recommend screening tests for certain conditions. This may include: Lipid and cholesterol levels. Diabetes screening. This is done by checking your blood sugar (glucose) after you have not eaten for a while (fasting). Pelvic exam and Pap test. Hepatitis B test. Hepatitis C  test. HIV (human immunodeficiency virus) test. STI (sexually transmitted infection) testing, if you are at risk. Lung cancer screening. Colorectal cancer screening. Mammogram. Talk with your health care provider about when you should start having regular mammograms. This may depend on whether you have a family history of breast cancer. BRCA-related cancer screening. This may be done if you have a family history of breast, ovarian, tubal, or peritoneal cancers. Bone density scan. This is done to screen for osteoporosis. Talk with your health care provider about your test results, treatment options, and if necessary, the need for more tests. Follow these instructions at home: Eating and drinking  Eat a diet that includes fresh fruits and vegetables, whole grains, lean protein, and low-fat dairy products. Take vitamin and mineral supplements as recommended by your health care provider. Do not drink alcohol if: Your health care provider tells you not to drink. You are pregnant, may be pregnant, or are planning to become pregnant. If you drink alcohol: Limit how much you have to 0-1 drink a day. Know how much alcohol is in your drink. In the [place] one drink equals one 12 oz bottle of beer (355 mL), one 5 oz glass of wine (148 mL), or one 1 oz glass of hard liquor (44 mL). Lifestyle Brush your teeth every morning and night with fluoride toothpaste. Floss one time each day. Exercise for at least 30 minutes 5 or more days each week. Do not use any products that contain nicotine or tobacco. These products include cigarettes, chewing tobacco, and vaping devices, such as e-cigarettes. If you need help quitting, ask your health care provider. Do not use drugs. If you are sexually active, practice safe sex. Use a condom or other form of protection to   prevent STIs. If you do not wish to become pregnant, use a form of birth control. If you plan to become pregnant, see your health care provider for a  prepregnancy visit. Take aspirin only as told by your health care provider. Make sure that you understand how much to take and what form to take. Work with your health care provider to find out whether it is safe and beneficial for you to take aspirin daily. Find healthy ways to manage stress, such as: Meditation, yoga, or listening to music. Journaling. Talking to a trusted person. Spending time with friends and family. Minimize exposure to UV radiation to reduce your risk of skin cancer. Safety Always wear your seat belt while driving or riding in a vehicle. Do not drive: If you have been drinking alcohol. Do not ride with someone who has been drinking. When you are tired or distracted. While texting. If you have been using any mind-altering substances or drugs. Wear a helmet and other protective equipment during sports activities. If you have firearms in your house, make sure you follow all gun safety procedures. Seek help if you have been physically or sexually abused. What's next? Visit your health care provider once a year for an annual wellness visit. Ask your health care provider how often you should have your eyes and teeth checked. Stay up to date on all vaccines. This information is not intended to replace advice given to you by your health care provider. Make sure you discuss any questions you have with your health care provider. Document Revised: 08/29/2020 Document Reviewed: 08/29/2020 Elsevier Patient Education  2023 Elsevier Inc.  

## 2022-05-05 NOTE — Assessment & Plan Note (Signed)
Ghm utd Check labs  See AVS
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
## 2022-05-05 NOTE — Assessment & Plan Note (Signed)
Con't lexapro and xanax

## 2022-05-05 NOTE — Assessment & Plan Note (Signed)
Well controlled, no changes to meds. Encouraged heart healthy diet such as the DASH diet and exercise as tolerated.  

## 2022-05-06 ENCOUNTER — Other Ambulatory Visit: Payer: Self-pay | Admitting: Family Medicine

## 2022-05-06 DIAGNOSIS — M170 Bilateral primary osteoarthritis of knee: Secondary | ICD-10-CM

## 2022-05-06 DIAGNOSIS — F411 Generalized anxiety disorder: Secondary | ICD-10-CM

## 2022-05-07 ENCOUNTER — Other Ambulatory Visit: Payer: Self-pay | Admitting: Family Medicine

## 2022-05-07 ENCOUNTER — Telehealth: Payer: Self-pay

## 2022-05-07 DIAGNOSIS — E785 Hyperlipidemia, unspecified: Secondary | ICD-10-CM

## 2022-05-07 NOTE — Telephone Encounter (Signed)
PA initiated via Covermymeds; KEY: [id number]. Awaiting determination.

## 2022-05-07 NOTE — Telephone Encounter (Signed)
Requesting: xanax Contract: 05/02/21 UDS:05/02/21 Last Visit:05/05/22 Next Visit:05/08/23 Last Refill:03/07/22  Please Advise

## 2022-05-09 ENCOUNTER — Encounter: Payer: Self-pay | Admitting: Family Medicine

## 2022-05-09 NOTE — Telephone Encounter (Signed)
PA denied.   Per your health plan's criteria, this drug is covered if you meet the following: There are paid claims or your doctor provides medical records (for example: chart notes) showing you have tried or cannot use at least three covered drug. Covered drugs include  (I) Femring*.  (II) Menest.  (III) Premarin tablet.  (IV) Dotti,estradiol,or Lyllana*. The information provided does not show that you meet the criteria listed above. Please speak with your doctor about your choices. Reviewed by: Saul FordycePh. **Please note: The drug(s) listed above may require additional review.

## 2022-05-26 ENCOUNTER — Encounter (HOSPITAL_BASED_OUTPATIENT_CLINIC_OR_DEPARTMENT_OTHER): Payer: Self-pay

## 2022-05-26 ENCOUNTER — Ambulatory Visit (HOSPITAL_BASED_OUTPATIENT_CLINIC_OR_DEPARTMENT_OTHER)
Admission: RE | Admit: 2022-05-26 | Discharge: 2022-05-26 | Disposition: A | Discharge status: Home / Self Care | Payer: BC Managed Care – PPO | Source: Ambulatory Visit | Attending: Family Medicine | Admitting: Family Medicine

## 2022-05-26 DIAGNOSIS — Z1231 Encounter for screening mammogram for malignant neoplasm of breast: Secondary | ICD-10-CM

## 2022-06-10 ENCOUNTER — Ambulatory Visit: Payer: BC Managed Care – PPO | Admitting: Family Medicine

## 2022-06-10 VITALS — BP 132/80 | HR 80 | Temp 98.1°F | Resp 18 | Ht 59.0 in | Wt 222.0 lb

## 2022-06-10 DIAGNOSIS — R600 Localized edema: Secondary | ICD-10-CM

## 2022-06-10 DIAGNOSIS — M62830 Muscle spasm of back: Secondary | ICD-10-CM

## 2022-06-10 DIAGNOSIS — J0140 Acute pansinusitis, unspecified: Secondary | ICD-10-CM | POA: Diagnosis not present

## 2022-06-10 MED ORDER — METHOCARBAMOL 750 MG PO TABS: 750.0000 mg | ORAL_TABLET | Freq: Four times a day (QID) | ORAL | 0 refills | Status: DC | PRN

## 2022-06-10 MED ORDER — HYDROCHLOROTHIAZIDE 25 MG PO TABS: 25.0000 mg | ORAL_TABLET | Freq: Every day | ORAL | 3 refills | Status: DC

## 2022-06-10 MED ORDER — PREDNISONE 10 MG PO TABS: ORAL_TABLET | ORAL | 0 refills | Status: DC

## 2022-06-10 MED ORDER — DOXYCYCLINE HYCLATE 100 MG PO TABS: 100.0000 mg | ORAL_TABLET | Freq: Two times a day (BID) | ORAL | 0 refills | Status: DC

## 2022-06-10 NOTE — Patient Instructions (Signed)

## 2022-06-10 NOTE — Progress Notes (Signed)
Subjective:   By signing my name below, I, Shehryar Baig, attest that this documentation has been prepared under the direction and in the presence of Donato Schultz, DO. 06/10/2022   Patient ID: Deborah Watts, female    DOB: December 14, 1965, 57 y.o.   MRN: [id number]  Chief Complaint  Patient presents with   Ear Pain    Bilateral ear pain started this morning, pt feels like face is swollen on the left side and stated having vertigo.     HPI Patient is in today for a office visit.   She complains of bilateral ear pain since this morning. She is also having facial pain, facial swelling, pain in her back teeth and dry sinus. She is taking Atrovent nasal spray and Flonase nasal spray and sudafed to manage her symptoms.  She also complains of swelling in her foot in the morning. Her foot swollen ups to her knee. Her swelling doesn't happen if she does not work.   Past Medical History:  Diagnosis Date   Anxiety    Arthritis    Asthma    Headache    migraines   History of hiatal hernia    repaired 30 yrs ago in [place]   Hypertension    MHA (microangiopathic hemolytic anemia) (HCC)     Past Surgical History:  Procedure Laterality Date   APPENDECTOMY N/A    LAPAROSCOPIC CHOLECYSTECTOMY N/A 03/1992   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   [facility]   REPLACEMENT TOTAL KNEE Right 01/15/2021   [facility]   TOTAL ABDOMINAL HYSTERECTOMY Right    TUBAL LIGATION     WRIST ARTHROSCOPY Right 02/12/2018   Procedure: Right wrist arthroscopy, evaluation under anesthesia with debridement and repair as necessary and right carpal tunnel release;  Surgeon: Dominica Severin, MD;  Location: [facility] OR;  Service: Orthopedics;  Laterality: Right;  90 mins    Family History  Problem Relation Age of Onset   Throat cancer Father    Kidney failure Father    Asthma Other    Diabetes Other    Hyperlipidemia Other     Social History   Socioeconomic History   Marital status: Married    Spouse name:  Not on file   Number of children: Not on file   Years of education: Not on file   Highest education level: Some college, no degree  Occupational History   Occupation: Administrator, 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[place]  Tobacco Use   Smoking status: Never   Smokeless tobacco: Never  Vaping Use   Vaping Use: Never used  Substance and Sexual Activity   Alcohol use: Yes    Alcohol/week: 0.0 standard drinks of alcohol    Comment: occassional   Drug use: No   Sexual activity: Yes    Partners: Male  Other Topics Concern   Not on file  Social History Narrative   Exercise-- no   Social Determinants of Health   Financial Resource Strain: Low Risk  (06/10/2022)   Overall Financial Resource Strain (CARDIA)    Difficulty of Paying Living Expenses: Not hard at all  Food Insecurity: No Food Insecurity (06/10/2022)   Hunger Vital Sign    Worried About Running Out of Food in the Last Year: Never true    Ran Out of Food in the Last Year: Never true  Transportation Needs: No Transportation Needs (06/10/2022)   PRAPARE - Transportation    Lack of Transportation (Medical): No    Lack of  Transportation (Non-Medical): No  Physical Activity: Insufficiently Active (06/10/2022)   Exercise Vital Sign    Days of Exercise per Week: 3 days    Minutes of Exercise per Session: 10 min  Stress: No Stress Concern Present (06/10/2022)   [employer] of Occupational Health - Occupational Stress Questionnaire    Feeling of Stress : Not at all  Social Connections: Socially Integrated (06/10/2022)   Social Connection and Isolation Panel [NHANES]    Frequency of Communication with Friends and Family: More than three times a week    Frequency of Social Gatherings with Friends and Family: Once a week    Attends Religious Services: More than 4 times per year    Active Member of [employer] or Organizations: Yes    Attends Engineer, structural: More than 4 times per year    Marital Status: Married  Careers information officer Violence: Not on file    Outpatient Medications Prior to Visit  Medication Sig Dispense Refill   albuterol (VENTOLIN HFA) 108 (90 Base) MCG/ACT inhaler Inhale 2 puffs into the lungs every 6 (six) hours as needed for wheezing or shortness of breath (Cough). 18 g 3   ALPRAZolam (XANAX) 0.5 MG tablet TAKE 1 TABLET BY MOUTH THREE TIMES DAILYAS NEEDED 90 tablet 1   BREO ELLIPTA 100-25 MCG/ACT AEPB Inhale 1 puff into the lungs daily. 60 each 2   diclofenac (VOLTAREN) 75 MG EC tablet TAKE ONE TABLET BY MOUTH TWO TIMES DAILY 60 tablet 1   escitalopram (LEXAPRO) 20 MG tablet Take 1 tablet (20 mg total) by mouth daily. 90 tablet 3   fluticasone (FLONASE) 50 MCG/ACT nasal spray Place 2 sprays into both nostrils daily. 16 g 0   fluticasone-salmeterol (ADVAIR) 100-50 MCG/ACT AEPB INHALE 2 DOSES BY MOUTH TWICE DAILY 180 each 0   ipratropium (ATROVENT) 0.03 % nasal spray Place 2 sprays into both nostrils every 12 (twelve) hours. 30 mL 0   Ketorolac Tromethamine (TORADOL ORAL PO) Take 10 mg by mouth every 6 (six) hours as needed.     Meclizine HCl (TRAVEL SICKNESS) 25 MG CHEW CHEW 1 TABLET EVERY 6 HOURS AS NEEDED 30 tablet 1   montelukast (SINGULAIR) 10 MG tablet Take 1 tablet (10 mg total) by mouth at bedtime. 90 tablet 3   nystatin (MYCOSTATIN) 100000 UNIT/ML suspension Take 5 mLs (500,000 Units total) by mouth 4 (four) times daily. 60 mL 0   pregabalin (LYRICA) 225 MG capsule Take 225 mg by mouth 2 (two) times daily.     SUMAtriptan (IMITREX) 100 MG tablet TAKE 1 TABLET BY MOUTH AS NEEDED FOR MIGRAINE. REPEAT IN 2 HOURS IF NEEDED 12 tablet 3   topiramate (TOPAMAX) 50 MG tablet TAKE ONE TABLET BY MOUTH TWICE DAILY 180 tablet 1   traMADol (ULTRAM) 50 MG tablet Take 1 tablet (50 mg total) by mouth every 8 (eight) hours as needed. 30 tablet 0   triamcinolone cream (KENALOG) 0.1 % Apply 1 Application topically 2 (two) times daily. 30 g 0   Fezolinetant (VEOZAH) 45 MG TABS Take 1 tablet (45 mg total) by  mouth daily. 30 tablet 11   No facility-administered medications prior to visit.    Allergies  Allergen Reactions   Fish Allergy Hives and Swelling   Penicillins Anaphylaxis, Hives and Other (See Comments)    Has patient had a PCN reaction causing immediate rash, facial/tongue/throat swelling, SOB or lightheadedness with hypotension: Unknown Has patient had a PCN reaction causing severe rash involving mucus membranes or skin  necrosis: No Has patient had a PCN reaction that required hospitalization: Yes Has patient had a PCN reaction occurring within the last 10 years: No If all of the above answers are "NO", then may proceed with Cephalosporin use.    Tizanidine Hcl Nausea And Vomiting, Rash and Shortness Of Breath   Iohexol Other (See Comments)     Desc: hives, sob, pt. needs 13 hr prep   01/16/05    Ivp Dye [Iodinated Contrast Media] Hives and Swelling   Morphine And Related Nausea And Vomiting    Review of Systems  Constitutional:  Negative for fever and malaise/fatigue.  HENT:  Negative for congestion.   Eyes:  Negative for blurred vision.  Respiratory:  Negative for cough and shortness of breath.   Cardiovascular:  Negative for chest pain, palpitations and leg swelling.  Gastrointestinal:  Negative for vomiting.  Musculoskeletal:  Negative for back pain.  Skin:  Negative for rash.  Neurological:  Negative for loss of consciousness and headaches.       Objective:    Physical Exam Vitals and nursing note reviewed.  Constitutional:      General: She is not in acute distress.    Appearance: Normal appearance. She is not ill-appearing.  HENT:     Head: Normocephalic and atraumatic.     Right Ear: Tympanic membrane and external ear normal. There is no impacted cerumen.     Left Ear: Tympanic membrane and external ear normal. There is no impacted cerumen.     Nose:     Right Sinus: Maxillary sinus tenderness and frontal sinus tenderness present.     Left Sinus: Maxillary  sinus tenderness and frontal sinus tenderness present.  Eyes:     Extraocular Movements: Extraocular movements intact.     Pupils: Pupils are equal, round, and reactive to light.  Cardiovascular:     Rate and Rhythm: Normal rate and regular rhythm.     Heart sounds: Normal heart sounds. No murmur heard.    No gallop.  Pulmonary:     Effort: Pulmonary effort is normal. No respiratory distress.     Breath sounds: Normal breath sounds. No wheezing or rales.  Skin:    General: Skin is warm and dry.  Neurological:     Mental Status: She is alert and oriented to person, place, and time.  Psychiatric:        Judgment: Judgment normal.     BP 132/80 (BP Location: Left Arm, Patient Position: Sitting, Cuff Size: Large)   Pulse 80   Temp 98.1 F (36.7 C) (Oral)   Resp 18   Ht 4\' 11"  (1.499 m)   Wt 222 lb (100.7 kg)   SpO2 95%   BMI 44.84 kg/m  Wt Readings from Last 3 Encounters:  06/10/22 222 lb (100.7 kg)  05/05/22 222 lb 6.4 oz (100.9 kg)  03/21/22 222 lb (100.7 kg)       Assessment & Plan:  Acute non-recurrent pansinusitis -     Doxycycline Hyclate; Take 1 tablet (100 mg total) by mouth 2 (two) times daily.  Dispense: 20 tablet; Refill: 0 -     predniSONE; TAKE 3 TABLETS PO QD FOR 3 DAYS THEN TAKE 2 TABLETS PO QD FOR 3 DAYS THEN TAKE 1 TABLET PO QD FOR 3 DAYS THEN TAKE 1/2 TAB PO QD FOR 3 DAYS  Dispense: 20 tablet; Refill: 0  Back muscle spasm -     Methocarbamol; Take 1 tablet (750 mg total) by mouth every 6 (  six) hours as needed for muscle spasms.  Dispense: 45 tablet; Refill: 0  Lower extremity edema -     hydroCHLOROthiazide; Take 1 tablet (25 mg total) by mouth daily.  Dispense: 90 tablet; Refill: 3    I, Donato Schultz, DO, personally preformed the services described in this documentation.  All medical record entries made by the scribe were at my direction and in my presence.  I have reviewed the chart and discharge instructions (if applicable) and agree that the  record reflects my personal performance and is accurate and complete. 06/10/2022   I,Shehryar Baig,acting as a scribe for Donato Schultz, DO.,have documented all relevant documentation on the behalf of Donato Schultz, DO,as directed by  Donato Schultz, DO while in the presence of Donato Schultz, DO.   Donato Schultz, DO

## 2022-06-30 ENCOUNTER — Encounter: Payer: Self-pay | Admitting: *Deleted

## 2022-07-08 ENCOUNTER — Other Ambulatory Visit: Payer: Self-pay | Admitting: *Deleted

## 2022-07-08 DIAGNOSIS — M170 Bilateral primary osteoarthritis of knee: Secondary | ICD-10-CM

## 2022-07-08 MED ORDER — DICLOFENAC SODIUM 75 MG PO TBEC: 75.0000 mg | DELAYED_RELEASE_TABLET | Freq: Two times a day (BID) | ORAL | 1 refills | Status: DC

## 2022-07-21 ENCOUNTER — Encounter: Payer: Self-pay | Admitting: Family Medicine

## 2022-07-21 ENCOUNTER — Ambulatory Visit (HOSPITAL_BASED_OUTPATIENT_CLINIC_OR_DEPARTMENT_OTHER)
Admission: RE | Admit: 2022-07-21 | Discharge: 2022-07-21 | Disposition: A | Discharge status: Home / Self Care | Payer: BC Managed Care – PPO | Source: Ambulatory Visit | Attending: Family Medicine | Admitting: Family Medicine

## 2022-07-21 ENCOUNTER — Ambulatory Visit (HOSPITAL_BASED_OUTPATIENT_CLINIC_OR_DEPARTMENT_OTHER): Payer: BC Managed Care – PPO

## 2022-07-21 ENCOUNTER — Ambulatory Visit: Payer: BC Managed Care – PPO | Admitting: Family Medicine

## 2022-07-21 ENCOUNTER — Other Ambulatory Visit: Payer: Self-pay | Admitting: Family Medicine

## 2022-07-21 VITALS — BP 120/78 | HR 78 | Temp 98.4°F | Resp 18 | Ht 59.0 in | Wt 226.6 lb

## 2022-07-21 DIAGNOSIS — J45909 Unspecified asthma, uncomplicated: Secondary | ICD-10-CM

## 2022-07-21 DIAGNOSIS — R0602 Shortness of breath: Secondary | ICD-10-CM | POA: Insufficient documentation

## 2022-07-21 DIAGNOSIS — R600 Localized edema: Secondary | ICD-10-CM

## 2022-07-21 DIAGNOSIS — E785 Hyperlipidemia, unspecified: Secondary | ICD-10-CM

## 2022-07-21 DIAGNOSIS — M7989 Other specified soft tissue disorders: Secondary | ICD-10-CM | POA: Diagnosis not present

## 2022-07-21 DIAGNOSIS — R7989 Other specified abnormal findings of blood chemistry: Secondary | ICD-10-CM

## 2022-07-21 DIAGNOSIS — R609 Edema, unspecified: Secondary | ICD-10-CM | POA: Diagnosis not present

## 2022-07-21 DIAGNOSIS — R0609 Other forms of dyspnea: Secondary | ICD-10-CM

## 2022-07-21 LAB — D-DIMER, QUANTITATIVE: D-Dimer, Quant: 1.35 mcg/mL FEU — ABNORMAL HIGH (ref <0.50)

## 2022-07-21 MED ORDER — HYDROCHLOROTHIAZIDE 25 MG PO TABS: 25.0000 mg | ORAL_TABLET | Freq: Every day | ORAL | 3 refills | Status: DC

## 2022-07-21 NOTE — Assessment & Plan Note (Signed)
She has not used her inhalers today

## 2022-07-21 NOTE — Assessment & Plan Note (Signed)
Hctz daily  Elevate legs compression

## 2022-07-21 NOTE — Assessment & Plan Note (Signed)
Check labs  May be from asthma--- cxr

## 2022-07-21 NOTE — Progress Notes (Addendum)
Subjective:   By signing my name below, I, Carlena Bjornstad, attest that this documentation has been prepared under the direction and in the presence of Seabron Spates R, DO. 07/21/2022.   Patient ID: Deborah Watts, female    DOB: 09/26/1965, 57 y.o.   MRN: [id number]  Chief Complaint  Patient presents with   Leg Swelling    Pt states going on for a while, pt states having tightness. Pt states unable to put on pants over the weekend.     HPI Patient is in today for an office visit.  Initially, her swelling began last Wednesday. On Saturday, her LLE was severely swollen, moreso than her right LE. She was unable to put on her jeans. At this time she states the swelling has improved since then but has not resolved. The last time she took her HCTZ was last Thursday and Friday.   She did feel somewhat short of breath on Saturday, but she felt this was normal considering her asthma and pollen allergies. Her oxygen saturation is low in clinic today. She had forgotten to use her Advair inhaler this morning.  Yesterday and today she has also been drinking orange juice due to LE muscle cramping that occurred after her swelling began to improve. Additionally she has been drinking adult Pedialyte drinks.  She also endorses night sweats at times.   Past Medical History:  Diagnosis Date   Anxiety    Arthritis    Asthma    Headache    migraines   History of hiatal hernia    repaired 30 yrs ago in [place]   Hypertension    MHA (microangiopathic hemolytic anemia) (HCC)     Past Surgical History:  Procedure Laterality Date   APPENDECTOMY N/A    LAPAROSCOPIC CHOLECYSTECTOMY N/A 03/1992   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   RE[facility]ACEMENT TOTAL KNEE Right [facility]/15/2021   [facility]   TOTAL ABDOMINAL HYSTERECTOMY Right    TUBAL LIGATION     WRIST ARTHROSCOPY Right 02/12/2018   Procedure: Right wrist arthroscopy, evaluation under anesthesia with debridement and repair as necessary  and right carpal tunnel release;  Surgeon: Dominica Severin, MD;  Location: [facility] OR;  Service: Orthopedics;  Laterality: Right;  90 mins    Family History  Problem Relation Age of Onset   Throat cancer Father    Kidney failure Father    Asthma Other    Diabetes Other    Hyperlipidemia Other     Social History   Socioeconomic History   Marital status: Married    Spouse name: Not on file   Number of children: Not on file   Years of education: Not on file   Highest education level: Some college, no degree  Occupational History   Occupation: Administrator, 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[place]  Tobacco Use   Smoking status: Never   Smokeless tobacco: Never  Vaping Use   Vaping Use: Never used  Substance and Sexual Activity   Alcohol use: Yes    Alcohol/week: 0.0 standard drinks of alcohol    Comment: occassional   Drug use: No   Sexual activity: Yes    Partners: Male  Other Topics Concern   Not on file  Social History Narrative   Exercise-- no   Social Determinants of Health   Financial Resource Strain: Low Risk  (06/10/2022)   Overall Financial Resource Strain (CARDIA)    Difficulty of Paying Living Expenses: Not hard at all  Food Insecurity: No Food Insecurity (06/10/2022)   Hunger Vital Sign    Worried About Running Out of Food in the Last Year: Never true    Ran Out of Food in the Last Year: Never true  Transportation Needs: No Transportation Needs (06/10/2022)   PRAPARE - Administrator, Civil Service (Medical): No    Lack of Transportation (Non-Medical): No  Physical Activity: Insufficiently Active (06/10/2022)   Exercise Vital Sign    Days of Exercise per Week: 3 days    Minutes of Exercise per Session: 10 min  Stress: No Stress Concern Present (06/10/2022)   [employer] of Occupational Health - Occupational Stress Questionnaire    Feeling of Stress : Not at all  Social Connections: Socially Integrated (06/10/2022)   Social Connection and Isolation Panel  [NHANES]    Frequency of Communication with Friends and Family: More than three times a week    Frequency of Social Gatherings with Friends and Family: Once a week    Attends Religious Services: More than 4 times per year    Active Member of [employer] or Organizations: Yes    Attends Engineer, structural: More than 4 times per year    Marital Status: Married  Catering manager Violence: Not on file    Outpatient Medications Prior to Visit  Medication Sig Dispense Refill   albuterol (VENTOLIN HFA) 108 (90 Base) MCG/ACT inhaler Inhale 2 puffs into the lungs every 6 (six) hours as needed for wheezing or shortness of breath (Cough). 18 g 3   ALPRAZolam (XANAX) 0.5 MG tablet TAKE 1 TABLET BY MOUTH THREE TIMES DAILYAS NEEDED 90 tablet 1   diclofenac (VOLTAREN) 75 MG EC tablet Take 1 tablet (75 mg total) by mouth 2 (two) times daily. 60 tablet 1   escitalopram (LEXAPRO) 20 MG tablet Take 1 tablet (20 mg total) by mouth daily. 90 tablet 3   fluticasone (FLONASE) 50 MCG/ACT nasal spray Place 2 sprays into both nostrils daily. 16 g 0   fluticasone-salmeterol (ADVAIR) 100-50 MCG/ACT AEPB INHALE 2 DOSES BY MOUTH TWICE DAILY 180 each 0   ipratropium (ATROVENT) 0.03 % nasal spray Place 2 sprays into both nostrils every 12 (twelve) hours. 30 mL 0   Ketorolac Tromethamine (TORADOL ORAL PO) Take 10 mg by mouth every 6 (six) hours as needed.     Meclizine HCl (TRAVEL SICKNESS) 25 MG CHEW CHEW 1 TABLET EVERY 6 HOURS AS NEEDED 30 tablet 1   methocarbamol (ROBAXIN-750) 750 MG tablet Take 1 tablet (750 mg total) by mouth every 6 (six) hours as needed for muscle spasms. 45 tablet 0   montelukast (SINGULAIR) 10 MG tablet Take 1 tablet (10 mg total) by mouth at bedtime. 90 tablet 3   nystatin (MYCOSTATIN) 100000 UNIT/ML suspension Take 5 mLs (500,000 Units total) by mouth 4 (four) times daily. 60 mL 0   predniSONE (DELTASONE) 10 MG tablet TAKE 3 TABLETS PO QD FOR 3 DAYS THEN TAKE 2 TABLETS PO QD FOR 3 DAYS THEN  TAKE 1 TABLET PO QD FOR 3 DAYS THEN TAKE 1/2 TAB PO QD FOR 3 DAYS 20 tablet 0   pregabalin (LYRICA) 225 MG capsule Take 225 mg by mouth 2 (two) times daily.     SUMAtriptan (IMITREX) 100 MG tablet TAKE 1 TABLET BY MOUTH AS NEEDED FOR MIGRAINE. REPEAT IN 2 HOURS IF NEEDED 12 tablet 3   topiramate (TOPAMAX) 50 MG tablet TAKE ONE TABLET BY MOUTH TWICE DAILY 180 tablet 1   traMADol (  ULTRAM) 50 MG tablet Take 1 tablet (50 mg total) by mouth every 8 (eight) hours as needed. 30 tablet 0   triamcinolone cream (KENALOG) 0.1 % Apply 1 Application topically 2 (two) times daily. 30 g 0   BREO ELLIPTA 100-25 MCG/ACT AEPB Inhale 1 puff into the lungs daily. 60 each 2   doxycycline (VIBRA-TABS) 100 MG tablet Take 1 tablet (100 mg total) by mouth 2 (two) times daily. 20 tablet 0   hydrochlorothiazide (HYDRODIURIL) 25 MG tablet Take 1 tablet (25 mg total) by mouth daily. 90 tablet 3   No facility-administered medications prior to visit.    Allergies  Allergen Reactions   Fish Allergy Hives and Swelling   Penicillins Anaphylaxis, Hives and Other (See Comments)    Has patient had a PCN reaction causing immediate rash, facial/tongue/throat swelling, SOB or lightheadedness with hypotension: Unknown Has patient had a PCN reaction causing severe rash involving mucus membranes or skin necrosis: No Has patient had a PCN reaction that required hospitalization: Yes Has patient had a PCN reaction occurring within the last 10 years: No If all of the above answers are "NO", then may proceed with Cephalosporin use.    Tizanidine Hcl Nausea And Vomiting, Rash and Shortness Of Breath   Iohexol Other (See Comments)     Desc: hives, sob, pt. needs 13 hr prep   01/16/05    Ivp Dye [Iodinated Contrast Media] Hives and Swelling   Morphine And Related Nausea And Vomiting    Review of Systems  Constitutional:  Negative for chills, fever and malaise/fatigue.  HENT:  Negative for congestion and hearing loss.   Eyes:   Negative for blurred vision and discharge.  Respiratory:  Positive for shortness of breath. Negative for cough and sputum production.   Cardiovascular:  Positive for leg swelling (L>R). Negative for chest pain and palpitations.  Gastrointestinal:  Negative for abdominal pain, blood in stool, constipation, diarrhea, heartburn, nausea and vomiting.  Genitourinary:  Negative for dysuria, frequency, hematuria and urgency.  Musculoskeletal:  Positive for myalgias (LE muscle cramps). Negative for back pain and falls.  Skin:  Negative for rash.  Neurological:  Negative for dizziness, sensory change, loss of consciousness, weakness and headaches.  Endo/Heme/Allergies:  Negative for environmental allergies. Does not bruise/bleed easily.  Psychiatric/Behavioral:  Negative for depression and suicidal ideas. The patient is not nervous/anxious and does not have insomnia.        Objective:    Physical Exam Vitals and nursing note reviewed.  Constitutional:      General: She is not in acute distress.    Appearance: Normal appearance. She is well-developed. She is not diaphoretic.  HENT:     Head: Normocephalic and atraumatic.     Right Ear: Tympanic membrane, ear canal and external ear normal.     Left Ear: Tympanic membrane, ear canal and external ear normal.     Nose: Nose normal.  Eyes:     General:        Right eye: No discharge.        Left eye: No discharge.     Extraocular Movements: Extraocular movements intact.     Conjunctiva/sclera: Conjunctivae normal.     Pupils: Pupils are equal, round, and reactive to light.  Neck:     Thyroid: No thyromegaly.     Vascular: No JVD.  Cardiovascular:     Rate and Rhythm: Normal rate and regular rhythm.     Heart sounds: Normal heart sounds. No murmur heard.  No gallop.  Pulmonary:     Effort: Pulmonary effort is normal. No respiratory distress.     Breath sounds: Normal breath sounds. No wheezing or rales.  Chest:     Chest Sasso: No  tenderness.  Abdominal:     General: Bowel sounds are normal. There is no distension.     Palpations: Abdomen is soft. There is no mass.     Tenderness: There is no abdominal tenderness. There is no guarding or rebound.  Genitourinary:    Vagina: Normal. No vaginal discharge.     Rectum: Guaiac result negative.  Musculoskeletal:        General: No tenderness. Normal range of motion.     Cervical back: Normal range of motion and neck supple.     Left lower leg: 1+ Edema present.  Lymphadenopathy:     Cervical: No cervical adenopathy.  Skin:    General: Skin is warm and dry.     Findings: No erythema or rash.  Neurological:     General: No focal deficit present.     Mental Status: She is alert and oriented to person, place, and time.     Cranial Nerves: No cranial nerve deficit.     Deep Tendon Reflexes: Reflexes are normal and symmetric.  Psychiatric:        Mood and Affect: Mood normal.        Behavior: Behavior normal.        Thought Content: Thought content normal.        Judgment: Judgment normal.     BP 120/78 (BP Location: Left Arm, Patient Position: Sitting, Cuff Size: Large)   Pulse 78   Temp 98.4 F (36.9 C) (Oral)   Resp 18   Ht 4\' 11"  (1.499 m)   Wt 226 lb 9.6 oz (102.8 kg)   SpO2 94%   BMI 45.77 kg/m  Wt Readings from Last 3 Encounters:  07/21/22 226 lb 9.6 oz (102.8 kg)  06/10/22 222 lb (100.7 kg)  05/05/22 222 lb 6.4 oz (100.9 kg)    Diabetic Foot Exam - Simple   No data filed    Lab Results  Component Value Date   WBC 9.5 05/05/2022   HGB 13.9 05/05/2022   HCT 42.2 05/05/2022   PLT 309.0 05/05/2022   GLUCOSE 92 05/05/2022   CHOL 164 05/05/2022   TRIG 233.0 (H) 05/05/2022   HDL 35.00 (L) 05/05/2022   LDLDIRECT 101.0 05/05/2022   LDLCALC 101 (H) 10/28/2021   ALT 30 05/05/2022   AST 22 05/05/2022   NA 143 05/05/2022   K 4.6 05/05/2022   CL 105 05/05/2022   CREATININE 0.78 05/05/2022   BUN 13 05/05/2022   CO2 30 05/05/2022   TSH 1.00  05/05/2022    Lab Results  Component Value Date   TSH 1.00 05/05/2022   Lab Results  Component Value Date   WBC 9.5 05/05/2022   HGB 13.9 05/05/2022   HCT 42.2 05/05/2022   MCV 86.7 05/05/2022   PLT 309.0 05/05/2022   Lab Results  Component Value Date   NA 143 05/05/2022   K 4.6 05/05/2022   CO2 30 05/05/2022   GLUCOSE 92 05/05/2022   BUN 13 05/05/2022   CREATININE 0.78 05/05/2022   BILITOT 0.4 05/05/2022   ALKPHOS 80 05/05/2022   AST 22 05/05/2022   ALT 30 05/05/2022   PROT 6.8 05/05/2022   ALBUMIN 4.1 05/05/2022   CALCIUM 9.6 05/05/2022   ANIONGAP 7 08/29/2021   GFR  84.06 05/05/2022   Lab Results  Component Value Date   CHOL 164 05/05/2022   Lab Results  Component Value Date   HDL 35.00 (L) 05/05/2022   Lab Results  Component Value Date   LDLCALC 101 (H) 10/28/2021   Lab Results  Component Value Date   TRIG 233.0 (H) 05/05/2022   Lab Results  Component Value Date   CHOLHDL 5 05/05/2022   No results found for: "HGBA1C"     Assessment & Plan:   Problem List Items Addressed This Visit       Unprioritized   SOB (shortness of breath)    Check labs  May be from asthma--- cxr       Relevant Orders   D-Dimer, Quantitative   DG Chest 2 View   Lower extremity edema - Primary   Relevant Medications   hydrochlorothiazide (HYDRODIURIL) 25 MG tablet   Other Relevant Orders   US Venous Img Lower Unilateral Left (DVT)   D-Dimer, Quantitative   DG Chest 2 View   Hyperlipidemia   Relevant Medications   hydrochlorothiazide (HYDRODIURIL) 25 MG tablet   Other Relevant Orders   Comprehensive metabolic panel   Lipid panel   Edema    Hctz daily  Elevate legs compression      Asthma, chronic, unspecified asthma severity, uncomplicated    She has not used her inhalers today        Meds ordered this encounter  Medications   hydrochlorothiazide (HYDRODIURIL) 25 MG tablet    Sig: Take 1 tablet (25 mg total) by mouth daily.    Dispense:  90  tablet    Refill:  3    I, Donato Schultz, DO, personally preformed the services described in this documentation.  All medical record entries made by the scribe were at my direction and in my presence.  I have reviewed the chart and discharge instructions (if applicable) and agree that the record reflects my personal performance and is accurate and complete. 07/21/2022.  I,Mathew Stumpf,acting as a Neurosurgeon for [employer], DO.,have documented all relevant documentation on the behalf of Donato Schultz, DO,as directed by  Donato Schultz, DO while in the presence of Donato Schultz, DO.   Donato Schultz, DO

## 2022-07-22 ENCOUNTER — Ambulatory Visit (HOSPITAL_BASED_OUTPATIENT_CLINIC_OR_DEPARTMENT_OTHER): Admission: RE | Admit: 2022-07-22 | Payer: BC Managed Care – PPO | Source: Ambulatory Visit

## 2022-07-22 ENCOUNTER — Other Ambulatory Visit: Payer: Self-pay | Admitting: Family Medicine

## 2022-07-22 ENCOUNTER — Encounter (HOSPITAL_BASED_OUTPATIENT_CLINIC_OR_DEPARTMENT_OTHER): Payer: Self-pay

## 2022-07-22 DIAGNOSIS — Z91041 Radiographic dye allergy status: Secondary | ICD-10-CM

## 2022-07-22 DIAGNOSIS — R911 Solitary pulmonary nodule: Secondary | ICD-10-CM

## 2022-07-22 LAB — LIPID PANEL
Cholesterol: 151 mg/dL (ref 0–200)
HDL: 29.8 mg/dL — ABNORMAL LOW (ref >39.00)
NonHDL: 121.35
Total CHOL/HDL Ratio: 5
Triglycerides: 297 mg/dL — ABNORMAL HIGH (ref 0.0–149.0)
VLDL: 59.4 mg/dL — ABNORMAL HIGH (ref 0.0–40.0)

## 2022-07-22 LAB — COMPREHENSIVE METABOLIC PANEL
ALT: 30 U/L (ref 0–35)
AST: 25 U/L (ref 0–37)
Albumin: 3.9 g/dL (ref 3.5–5.2)
Alkaline Phosphatase: 81 U/L (ref 39–117)
BUN: 12 mg/dL (ref 6–23)
CO2: 29 mEq/L (ref 19–32)
Calcium: 9.1 mg/dL (ref 8.4–10.5)
Chloride: 105 mEq/L (ref 96–112)
Creatinine, Ser: 0.78 mg/dL (ref 0.40–1.20)
GFR: 83.93 mL/min (ref >60.00)
Glucose, Bld: 127 mg/dL — ABNORMAL HIGH (ref 70–99)
Potassium: 3.8 mEq/L (ref 3.5–5.1)
Sodium: 142 mEq/L (ref 135–145)
Total Bilirubin: 0.3 mg/dL (ref 0.2–1.2)
Total Protein: 6.5 g/dL (ref 6.0–8.3)

## 2022-07-22 LAB — LDL CHOLESTEROL, DIRECT: Direct LDL: 100 mg/dL

## 2022-07-22 MED ORDER — PREDNISONE 50 MG PO TABS
ORAL_TABLET | ORAL | 0 refills | Status: DC
Start: 2022-07-22 — End: 2022-09-25

## 2022-07-23 ENCOUNTER — Ambulatory Visit (HOSPITAL_BASED_OUTPATIENT_CLINIC_OR_DEPARTMENT_OTHER)
Admission: RE | Admit: 2022-07-23 | Discharge: 2022-07-23 | Disposition: A | Discharge status: Home / Self Care | Payer: BC Managed Care – PPO | Source: Ambulatory Visit | Attending: Family Medicine | Admitting: Family Medicine

## 2022-07-23 ENCOUNTER — Telehealth: Payer: Self-pay

## 2022-07-23 DIAGNOSIS — R7989 Other specified abnormal findings of blood chemistry: Secondary | ICD-10-CM | POA: Insufficient documentation

## 2022-07-23 DIAGNOSIS — R0609 Other forms of dyspnea: Secondary | ICD-10-CM | POA: Insufficient documentation

## 2022-07-23 DIAGNOSIS — R0602 Shortness of breath: Secondary | ICD-10-CM | POA: Diagnosis not present

## 2022-07-23 MED ORDER — IOHEXOL 350 MG/ML SOLN
100.0000 mL | Freq: Once | INTRAVENOUS | Status: AC | PRN
  Administered 2022-07-23: 75 mL via INTRAVENOUS

## 2022-07-23 MED ORDER — METHYLPREDNISOLONE SODIUM SUCC 40 MG IJ SOLR: 40.0000 mg | Freq: Once | INTRAMUSCULAR | Status: DC

## 2022-07-23 MED ORDER — DIPHENHYDRAMINE HCL 50 MG/ML IJ SOLN: 50.0000 mg | Freq: Once | INTRAMUSCULAR | Status: DC

## 2022-07-23 MED ORDER — DIPHENHYDRAMINE HCL 25 MG PO CAPS: 50.0000 mg | ORAL_CAPSULE | Freq: Once | ORAL | Status: DC

## 2022-07-23 NOTE — Telephone Encounter (Signed)
-----   Message from Donato Schultz, DO sent at 07/22/2022  1:05 PM EDT ----- Let pt know due to her contrast allergy she needs to take prednisone prior to procedure-- its been sent to pharmacy and she needs to take 50 mg benadryl 50 mg 1 hour prior to procedure

## 2022-07-23 NOTE — Telephone Encounter (Signed)
Pt made aware. Imaging scheduled for this evening

## 2022-07-24 ENCOUNTER — Other Ambulatory Visit: Payer: Self-pay | Admitting: Family Medicine

## 2022-07-24 ENCOUNTER — Other Ambulatory Visit: Payer: Self-pay

## 2022-07-24 ENCOUNTER — Telehealth: Payer: Self-pay

## 2022-07-24 DIAGNOSIS — R7309 Other abnormal glucose: Secondary | ICD-10-CM

## 2022-07-24 DIAGNOSIS — E041 Nontoxic single thyroid nodule: Secondary | ICD-10-CM

## 2022-07-24 NOTE — Addendum Note (Signed)
Addended by: Rosita Kea on: 07/24/2022 01:10 PM   Modules accepted: Orders

## 2022-07-24 NOTE — Telephone Encounter (Signed)
Patient advised of elevated glucose levels states she was not fasting but is scheduled to have A1c checked on 07/28/2022.

## 2022-07-24 NOTE — Progress Notes (Signed)
Request for add on A1c per Dr. Zola Button 07/22/2022 due to elevated glucose level

## 2022-07-28 ENCOUNTER — Ambulatory Visit (HOSPITAL_COMMUNITY)
Admission: RE | Admit: 2022-07-28 | Discharge: 2022-07-28 | Disposition: A | Discharge status: Home / Self Care | Payer: BC Managed Care – PPO | Source: Ambulatory Visit | Attending: Family Medicine | Admitting: Family Medicine

## 2022-07-28 ENCOUNTER — Other Ambulatory Visit (INDEPENDENT_AMBULATORY_CARE_PROVIDER_SITE_OTHER): Payer: BC Managed Care – PPO

## 2022-07-28 DIAGNOSIS — R7309 Other abnormal glucose: Secondary | ICD-10-CM | POA: Diagnosis not present

## 2022-07-28 DIAGNOSIS — E041 Nontoxic single thyroid nodule: Secondary | ICD-10-CM | POA: Diagnosis not present

## 2022-07-28 LAB — HEMOGLOBIN A1C: Hgb A1c MFr Bld: 6.6 % — ABNORMAL HIGH (ref 4.6–6.5)

## 2022-07-29 ENCOUNTER — Encounter: Payer: Self-pay | Admitting: Family Medicine

## 2022-08-04 ENCOUNTER — Encounter: Payer: Self-pay | Admitting: Family Medicine

## 2022-08-04 DIAGNOSIS — F411 Generalized anxiety disorder: Secondary | ICD-10-CM

## 2022-08-04 MED ORDER — ALPRAZOLAM 0.5 MG PO TABS
ORAL_TABLET | ORAL | 1 refills | Status: DC
Start: 2022-08-04 — End: 2022-10-06

## 2022-08-04 NOTE — Telephone Encounter (Signed)
Requesting: Xanax Contract: 05/02/21 UDS: 05/02/21 Last OV: 07/21/2022 Next OV: 05/08/23 Last Refill: 05/07/22, #90--1 RF Database:   Please advise

## 2022-08-12 ENCOUNTER — Encounter: Payer: Self-pay | Admitting: Family Medicine

## 2022-08-12 ENCOUNTER — Ambulatory Visit: Payer: BC Managed Care – PPO | Admitting: Family Medicine

## 2022-08-12 VITALS — BP 110/78 | HR 88 | Temp 98.3°F | Resp 20 | Ht 59.0 in | Wt 225.6 lb

## 2022-08-12 DIAGNOSIS — M79662 Pain in left lower leg: Secondary | ICD-10-CM | POA: Diagnosis not present

## 2022-08-12 DIAGNOSIS — L03116 Cellulitis of left lower limb: Secondary | ICD-10-CM | POA: Diagnosis not present

## 2022-08-12 MED ORDER — DOXYCYCLINE HYCLATE 100 MG PO TABS
100.0000 mg | ORAL_TABLET | Freq: Two times a day (BID) | ORAL | 0 refills | Status: DC
Start: 2022-08-12 — End: 2022-09-25

## 2022-08-12 NOTE — Telephone Encounter (Signed)
Pt has OV today

## 2022-08-12 NOTE — Patient Instructions (Signed)

## 2022-08-12 NOTE — Assessment & Plan Note (Signed)
Abx  Venous doppler r/o clot

## 2022-08-12 NOTE — Progress Notes (Signed)
Subjective:   By signing my name below, I, Shehryar Baig, attest that this documentation has been prepared under the direction and in the presence of Donato Schultz, DO. 08/12/2022   Patient ID: Deborah Watts, female    DOB: 13-Jun-1965, 57 y.o.   MRN: [id number]  Chief Complaint  Patient presents with   Leg Swelling    X1 day, left lower leg, pt states pain with walking, redness, and sore to the touch. No falls or injuries    HPI Patient is in today for a office visit.   She complains of leg swelling in her lower left leg. She also developed redness in her lower left leg, pain while walking, and soreness to touch. Her pain radiates down to her left heel. Her symptoms start from her left ankle up to her bottom left knee.  She had cellulitis during her last on a different area. She denies shortness of breathe or chest pain.  She also complains of nausea since this morning.    Past Medical History:  Diagnosis Date   Anxiety    Arthritis    Asthma    Headache    migraines   History of hiatal hernia    repaired 30 yrs ago in [place]   Hypertension    MHA (microangiopathic hemolytic anemia) (HCC)     Past Surgical History:  Procedure Laterality Date   APPENDECTOMY N/A    LAPAROSCOPIC CHOLECYSTECTOMY N/A 03/1992   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   [facility]   RE[facility]ACEMENT TOTAL KNEE Right 01/15/2021   [facility]   TOTAL ABDOMINAL HYSTERECTOMY Right    TUBAL LIGATION     WRIST ARTHROSCOPY Right 02/12/2018   Procedure: Right wrist arthroscopy, evaluation under anesthesia with debridement and repair as necessary and right carpal tunnel release;  Surgeon: Dominica Severin, MD;  Location: [facility] OR;  Service: Orthopedics;  Laterality: Right;  90 mins    Family History  Problem Relation Age of Onset   Throat cancer Father    Kidney failure Father    Asthma Other    Diabetes Other    Hyperlipidemia Other     Social History   Socioeconomic History   Marital status:  Married    Spouse name: Not on file   Number of children: Not on file   Years of education: Not on file   Highest education level: Some college, no degree  Occupational History   Occupation: Administrator, 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[place]  Tobacco Use   Smoking status: Never   Smokeless tobacco: Never  Vaping Use   Vaping Use: Never used  Substance and Sexual Activity   Alcohol use: Yes    Alcohol/week: 0.0 standard drinks of alcohol    Comment: occassional   Drug use: No   Sexual activity: Yes    Partners: Male  Other Topics Concern   Not on file  Social History Narrative   Exercise-- no   Social Determinants of Health   Financial Resource Strain: Low Risk  (06/10/2022)   Overall Financial Resource Strain (CARDIA)    Difficulty of Paying Living Expenses: Not hard at all  Food Insecurity: No Food Insecurity (06/10/2022)   Hunger Vital Sign    Worried About Running Out of Food in the Last Year: Never true    Ran Out of Food in the Last Year: Never true  Transportation Needs: No Transportation Needs (06/10/2022)   PRAPARE - Administrator, Civil Service (Medical):  No    Lack of Transportation (Non-Medical): No  Physical Activity: Insufficiently Active (06/10/2022)   Exercise Vital Sign    Days of Exercise per Week: 3 days    Minutes of Exercise per Session: 10 min  Stress: No Stress Concern Present (06/10/2022)   [employer] of Occupational Health - Occupational Stress Questionnaire    Feeling of Stress : Not at all  Social Connections: Socially Integrated (06/10/2022)   Social Connection and Isolation Panel [NHANES]    Frequency of Communication with Friends and Family: More than three times a week    Frequency of Social Gatherings with Friends and Family: Once a week    Attends Religious Services: More than 4 times per year    Active Member of [employer] or Organizations: Yes    Attends Engineer, structural: More than 4 times per year    Marital Status:  Married  Catering manager Violence: Not on file    Outpatient Medications Prior to Visit  Medication Sig Dispense Refill   albuterol (VENTOLIN HFA) 108 (90 Base) MCG/ACT inhaler Inhale 2 puffs into the lungs every 6 (six) hours as needed for wheezing or shortness of breath (Cough). 18 g 3   ALPRAZolam (XANAX) 0.5 MG tablet TAKE 1 TABLET BY MOUTH THREE TIMES DAILYAS NEEDED 90 tablet 1   diclofenac (VOLTAREN) 75 MG EC tablet Take 1 tablet (75 mg total) by mouth 2 (two) times daily. 60 tablet 1   escitalopram (LEXAPRO) 20 MG tablet Take 1 tablet (20 mg total) by mouth daily. 90 tablet 3   fluticasone (FLONASE) 50 MCG/ACT nasal spray Place 2 sprays into both nostrils daily. 16 g 0   fluticasone-salmeterol (ADVAIR) 100-50 MCG/ACT AEPB INHALE 2 DOSES BY MOUTH TWICE DAILY 180 each 0   hydrochlorothiazide (HYDRODIURIL) 25 MG tablet Take 1 tablet (25 mg total) by mouth daily. 90 tablet 3   ipratropium (ATROVENT) 0.03 % nasal spray Place 2 sprays into both nostrils every 12 (twelve) hours. 30 mL 0   Ketorolac Tromethamine (TORADOL ORAL PO) Take 10 mg by mouth every 6 (six) hours as needed.     Meclizine HCl (TRAVEL SICKNESS) 25 MG CHEW CHEW 1 TABLET EVERY 6 HOURS AS NEEDED 30 tablet 1   methocarbamol (ROBAXIN-750) 750 MG tablet Take 1 tablet (750 mg total) by mouth every 6 (six) hours as needed for muscle spasms. 45 tablet 0   montelukast (SINGULAIR) 10 MG tablet Take 1 tablet (10 mg total) by mouth at bedtime. 90 tablet 3   nystatin (MYCOSTATIN) 100000 UNIT/ML suspension Take 5 mLs (500,000 Units total) by mouth 4 (four) times daily. 60 mL 0   predniSONE (DELTASONE) 10 MG tablet TAKE 3 TABLETS PO QD FOR 3 DAYS THEN TAKE 2 TABLETS PO QD FOR 3 DAYS THEN TAKE 1 TABLET PO QD FOR 3 DAYS THEN TAKE 1/2 TAB PO QD FOR 3 DAYS 20 tablet 0   predniSONE (DELTASONE) 50 MG tablet 1 po 13h, 7 h and 1 hr prior to procedure 3 tablet 0   pregabalin (LYRICA) 225 MG capsule Take 225 mg by mouth 2 (two) times daily.      SUMAtriptan (IMITREX) 100 MG tablet TAKE 1 TABLET BY MOUTH AS NEEDED FOR MIGRAINE. REPEAT IN 2 HOURS IF NEEDED 12 tablet 3   topiramate (TOPAMAX) 50 MG tablet TAKE ONE TABLET BY MOUTH TWICE DAILY 180 tablet 1   traMADol (ULTRAM) 50 MG tablet Take 1 tablet (50 mg total) by mouth every 8 (eight) hours as needed.  30 tablet 0   triamcinolone cream (KENALOG) 0.1 % Apply 1 Application topically 2 (two) times daily. 30 g 0   No facility-administered medications prior to visit.    Allergies  Allergen Reactions   Fish Allergy Hives and Swelling   Penicillins Anaphylaxis, Hives and Other (See Comments)    Has patient had a PCN reaction causing immediate rash, facial/tongue/throat swelling, SOB or lightheadedness with hypotension: Unknown Has patient had a PCN reaction causing severe rash involving mucus membranes or skin necrosis: No Has patient had a PCN reaction that required hospitalization: Yes Has patient had a PCN reaction occurring within the last 10 years: No If all of the above answers are "NO", then may proceed with Cephalosporin use.    Tizanidine Hcl Nausea And Vomiting, Rash and Shortness Of Breath   Iohexol Other (See Comments)     Desc: hives, sob, pt. needs 13 hr prep   01/16/05    Ivp Dye [Iodinated Contrast Media] Hives and Swelling   Morphine And Codeine Nausea And Vomiting    Review of Systems  Constitutional:  Negative for fever and malaise/fatigue.  HENT:  Negative for congestion.   Eyes:  Negative for blurred vision.  Respiratory:  Negative for cough and shortness of breath.   Cardiovascular:  Positive for leg swelling (left lower leg). Negative for chest pain and palpitations.  Gastrointestinal:  Negative for vomiting.  Musculoskeletal:  Negative for back pain.       (+)left lower leg pain while walking  Skin:  Negative for rash.       (+)erythema on left lower leg (+)tenderness to palpation on left lower leg  Neurological:  Negative for loss of consciousness and  headaches.       Objective:    Physical Exam Vitals and nursing note reviewed.  Constitutional:      General: She is not in acute distress.    Appearance: Normal appearance. She is not ill-appearing.  HENT:     Head: Normocephalic and atraumatic.     Right Ear: External ear normal.     Left Ear: External ear normal.  Eyes:     Extraocular Movements: Extraocular movements intact.     Pupils: Pupils are equal, round, and reactive to light.  Cardiovascular:     Rate and Rhythm: Normal rate and regular rhythm.     Heart sounds: Normal heart sounds. No murmur heard.    No gallop.  Pulmonary:     Effort: Pulmonary effort is normal. No respiratory distress.     Breath sounds: Normal breath sounds. No wheezing or rales.  Skin:    General: Skin is warm and dry.     Comments: Erythema on lower left leg  Neurological:     Mental Status: She is alert and oriented to person, place, and time.  Psychiatric:        Judgment: Judgment normal.     BP 110/78 (BP Location: Left Arm, Patient Position: Sitting, Cuff Size: Large)   Pulse 88   Temp 98.3 F (36.8 C) (Oral)   Resp 20   Ht 4\' 11"  (1.499 m)   Wt 225 lb 9.6 oz (102.3 kg)   SpO2 95%   BMI 45.57 kg/m  Wt Readings from Last 3 Encounters:  08/12/22 225 lb 9.6 oz (102.3 kg)  07/21/22 226 lb 9.6 oz (102.8 kg)  06/10/22 222 lb (100.7 kg)       Assessment & Plan:  Pain of left calf Assessment & Plan: Pain with  palpation  Venous US    Orders: -     US Venous Img Lower Unilateral Left (DVT); Future  Cellulitis of left lower extremity Assessment & Plan: Abx  Venous doppler r/o clot     Orders: -     Doxycycline Hyclate; Take 1 tablet (100 mg total) by mouth 2 (two) times daily.  Dispense: 20 tablet; Refill: 0    I, Donato Schultz, DO, personally preformed the services described in this documentation.  All medical record entries made by the scribe were at my direction and in my presence.  I have reviewed the  chart and discharge instructions (if applicable) and agree that the record reflects my personal performance and is accurate and complete. 08/12/2022   I,Shehryar Baig,acting as a scribe for Donato Schultz, DO.,have documented all relevant documentation on the behalf of Donato Schultz, DO,as directed by  Donato Schultz, DO while in the presence of Donato Schultz, DO.   Donato Schultz, DO

## 2022-08-12 NOTE — Assessment & Plan Note (Signed)
Pain with palpation  Venous US

## 2022-08-13 ENCOUNTER — Encounter: Payer: Self-pay | Admitting: Family Medicine

## 2022-08-13 ENCOUNTER — Other Ambulatory Visit: Payer: Self-pay | Admitting: Family Medicine

## 2022-08-13 ENCOUNTER — Ambulatory Visit (HOSPITAL_BASED_OUTPATIENT_CLINIC_OR_DEPARTMENT_OTHER)
Admission: RE | Admit: 2022-08-13 | Discharge: 2022-08-13 | Disposition: A | Discharge status: Home / Self Care | Payer: BC Managed Care – PPO | Source: Ambulatory Visit | Attending: Family Medicine | Admitting: Family Medicine

## 2022-08-13 DIAGNOSIS — M79604 Pain in right leg: Secondary | ICD-10-CM | POA: Diagnosis not present

## 2022-08-13 DIAGNOSIS — L03116 Cellulitis of left lower limb: Secondary | ICD-10-CM

## 2022-08-13 DIAGNOSIS — M79662 Pain in left lower leg: Secondary | ICD-10-CM | POA: Insufficient documentation

## 2022-08-13 DIAGNOSIS — M79605 Pain in left leg: Secondary | ICD-10-CM | POA: Diagnosis not present

## 2022-08-13 DIAGNOSIS — M7989 Other specified soft tissue disorders: Secondary | ICD-10-CM | POA: Diagnosis not present

## 2022-08-13 DIAGNOSIS — M79661 Pain in right lower leg: Secondary | ICD-10-CM

## 2022-08-14 ENCOUNTER — Other Ambulatory Visit: Payer: Self-pay | Admitting: Family Medicine

## 2022-08-14 ENCOUNTER — Encounter: Payer: Self-pay | Admitting: Family Medicine

## 2022-08-14 DIAGNOSIS — M79662 Pain in left lower leg: Secondary | ICD-10-CM

## 2022-08-14 DIAGNOSIS — M79661 Pain in right lower leg: Secondary | ICD-10-CM

## 2022-08-18 ENCOUNTER — Ambulatory Visit: Payer: BC Managed Care – PPO | Admitting: Family Medicine

## 2022-08-18 ENCOUNTER — Other Ambulatory Visit: Payer: Self-pay | Admitting: Family Medicine

## 2022-08-18 ENCOUNTER — Encounter: Payer: Self-pay | Admitting: Family Medicine

## 2022-08-18 VITALS — BP 138/82 | HR 79 | Ht 59.0 in | Wt 223.8 lb

## 2022-08-18 DIAGNOSIS — M79662 Pain in left lower leg: Secondary | ICD-10-CM

## 2022-08-18 DIAGNOSIS — M79661 Pain in right lower leg: Secondary | ICD-10-CM

## 2022-08-18 DIAGNOSIS — R600 Localized edema: Secondary | ICD-10-CM

## 2022-08-18 MED ORDER — NONFORMULARY OR COMPOUNDED ITEM
1 refills | Status: DC
Start: 2022-08-18 — End: 2023-10-06

## 2022-08-18 MED ORDER — FUROSEMIDE 40 MG PO TABS
40.0000 mg | ORAL_TABLET | Freq: Every day | ORAL | 3 refills | Status: DC
Start: 2022-08-18 — End: 2022-11-25

## 2022-08-18 NOTE — Assessment & Plan Note (Signed)
Elevate legs Compression socks Lasix daily  D/c hctz OJ/ banana daily

## 2022-08-18 NOTE — Progress Notes (Signed)
Established Patient Office Visit  Subjective   Patient ID: Deborah Watts, female    DOB: [birth year]  Age: 57 y.o. MRN: [id number]  Chief Complaint  Patient presents with   Follow-up    Some improvement still has pain in leg  Leg is "knotted up"    HPI Pt here f/u swelling in legs and pain.  Venous US was normal  pt states legs are so swollen at the end of the day she has had to wear dresses at work because her legs would not fit in pants  Patient Active Problem List   Diagnosis Date Noted   Pain of left calf 08/12/2022   Cellulitis of left lower extremity 08/12/2022   Edema, lower extremity 07/21/2022   SOB (shortness of breath) 07/21/2022   Hyperlipidemia 07/21/2022   Major depressive disorder, recurrent episode, moderate (HCC) 11/25/2021   Generalized anxiety disorder 11/25/2021   Depression with anxiety 10/28/2021   Asthma, chronic, unspecified asthma severity, uncomplicated 05/02/2021   Loud snoring 11/28/2019   Daytime sleepiness 11/28/2019   Other fatigue 11/28/2019   Primary osteoarthritis of left knee 08/02/2019   Rib pain 10/19/2018   Right knee pain 04/12/2017   Preventative health care 03/25/2016   Left ankle pain 01/14/2016   Sinusitis, acute 12/22/2014   Left leg pain 04/28/2014   Pain of right calf 04/20/2014   Acute bacterial sinusitis 04/14/2014   Obesity (BMI 30-39.9) 01/06/2013   Influenza 03/10/2011   Thrush, oral 03/04/2011   TINEA CRURIS 02/05/2010   HYPOKALEMIA 04/26/2009   Primary hypertension 02/08/2008   LOW BACK PAIN SYNDROME 05/26/2007   LEG CRAMPS 05/14/2007   Edema 05/14/2007   Chronic migraine without aura, with status migrainosus 05/01/2007   ASTHMATIC BRONCHITIS, ACUTE 10/15/2006   Anxiety state 04/20/2006   Asthma with acute exacerbation 04/20/2006   RESTLESS LEG SYNDROME, HX OF 04/20/2006   Past Medical History:  Diagnosis Date   Anxiety    Arthritis    Asthma    Headache    migraines   History of hiatal hernia     repaired 30 yrs ago in [place]   Hypertension    MHA (microangiopathic hemolytic anemia) (HCC)    Past Surgical History:  Procedure Laterality Date   APPENDECTOMY N/A    LAPAROSCOPIC CHOLECYSTECTOMY N/A 03/1992   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   [facility]   REPLACEMENT TOTAL KNEE Right 01/15/2021   [facility]   TOTAL ABDOMINAL HYSTERECTOMY Right    TUBAL LIGATION     WRIST ARTHROSCOPY Right 02/12/2018   Procedure: Right wrist arthroscopy, evaluation under anesthesia with debridement and repair as necessary and right carpal tunnel release;  Surgeon: Dominica Severin, MD;  Location: [facility] OR;  Service: Orthopedics;  Laterality: Right;  90 mins   Social History   Tobacco Use   Smoking status: Never   Smokeless tobacco: Never  Vaping Use   Vaping Use: Never used  Substance Use Topics   Alcohol use: Yes    Alcohol/week: 0.0 standard drinks of alcohol    Comment: occassional   Drug use: No   Social History   Socioeconomic History   Marital status: Married    Spouse name: Not on file   Number of children: Not on file   Years of education: Not on file   Highest education level: Some college, no degree  Occupational History   Occupation: Administrator, 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[place]  Tobacco Use   Smoking status: Never   Smokeless  tobacco: Never  Vaping Use   Vaping Use: Never used  Substance and Sexual Activity   Alcohol use: Yes    Alcohol/week: 0.0 standard drinks of alcohol    Comment: occassional   Drug use: No   Sexual activity: Yes    Partners: Male  Other Topics Concern   Not on file  Social History Narrative   Exercise-- no   Social Determinants of Health   Financial Resource Strain: Low Risk  (06/10/2022)   Overall Financial Resource Strain (CARDIA)    Difficulty of Paying Living Expenses: Not hard at all  Food Insecurity: No Food Insecurity (06/10/2022)   Hunger Vital Sign    Worried About Running Out of Food in the Last Year: Never true    Ran Out of  Food in the Last Year: Never true  Transportation Needs: No Transportation Needs (06/10/2022)   PRAPARE - Administrator, Civil Service (Medical): No    Lack of Transportation (Non-Medical): No  Physical Activity: Insufficiently Active (06/10/2022)   Exercise Vital Sign    Days of Exercise per Week: 3 days    Minutes of Exercise per Session: 10 min  Stress: No Stress Concern Present (06/10/2022)   [employer] of Occupational Health - Occupational Stress Questionnaire    Feeling of Stress : Not at all  Social Connections: Socially Integrated (06/10/2022)   Social Connection and Isolation Panel [NHANES]    Frequency of Communication with Friends and Family: More than three times a week    Frequency of Social Gatherings with Friends and Family: Once a week    Attends Religious Services: More than 4 times per year    Active Member of [employer] or Organizations: Yes    Attends Engineer, structural: More than 4 times per year    Marital Status: Married  Catering manager Violence: Not on file   Family Status  Relation Name Status   Mother  Alive   Father  Deceased at age 79   Other  (Not Specified)   Family History  Problem Relation Age of Onset   Throat cancer Father    Kidney failure Father    Asthma Other    Diabetes Other    Hyperlipidemia Other    Allergies  Allergen Reactions   Fish Allergy Hives and Swelling   Penicillins Anaphylaxis, Hives and Other (See Comments)    Has patient had a PCN reaction causing immediate rash, facial/tongue/throat swelling, SOB or lightheadedness with hypotension: Unknown Has patient had a PCN reaction causing severe rash involving mucus membranes or skin necrosis: No Has patient had a PCN reaction that required hospitalization: Yes Has patient had a PCN reaction occurring within the last 10 years: No If all of the above answers are "NO", then may proceed with Cephalosporin use.    Tizanidine Hcl Nausea And Vomiting, Rash  and Shortness Of Breath   Iohexol Other (See Comments)     Desc: hives, sob, pt. needs 13 hr prep   01/16/05    Ivp Dye [Iodinated Contrast Media] Hives and Swelling   Morphine And Codeine Nausea And Vomiting      Review of Systems  Constitutional:  Negative for fever and malaise/fatigue.  HENT:  Negative for congestion.   Eyes:  Negative for blurred vision.  Respiratory:  Negative for cough and shortness of breath.   Cardiovascular:  Positive for leg swelling. Negative for chest pain and palpitations.  Gastrointestinal:  Negative for abdominal pain, blood in stool, nausea  and vomiting.  Genitourinary:  Negative for dysuria and frequency.  Musculoskeletal:  Negative for back pain and falls.  Skin:  Negative for rash.  Neurological:  Negative for dizziness, loss of consciousness and headaches.  Endo/Heme/Allergies:  Negative for environmental allergies.  Psychiatric/Behavioral:  Negative for depression. The patient is not nervous/anxious.       Objective:     BP 138/82 (BP Location: Left Arm, Patient Position: Sitting, Cuff Size: Large)   Pulse 79   Ht 4\' 11"  (1.499 m)   Wt 223 lb 12.8 oz (101.5 kg)   SpO2 99%   BMI 45.20 kg/m  BP Readings from Last 3 Encounters:  08/18/22 138/82  08/12/22 110/78  07/21/22 120/78   Wt Readings from Last 3 Encounters:  08/18/22 223 lb 12.8 oz (101.5 kg)  08/12/22 225 lb 9.6 oz (102.3 kg)  07/21/22 226 lb 9.6 oz (102.8 kg)   SpO2 Readings from Last 3 Encounters:  08/18/22 99%  08/12/22 95%  07/21/22 94%      Physical Exam Vitals and nursing note reviewed.  Constitutional:      Appearance: She is well-developed.  HENT:     Head: Normocephalic and atraumatic.  Eyes:     Conjunctiva/sclera: Conjunctivae normal.  Neck:     Thyroid: No thyromegaly.     Vascular: No carotid bruit or JVD.  Cardiovascular:     Rate and Rhythm: Normal rate and regular rhythm.     Heart sounds: Normal heart sounds. No murmur heard. Pulmonary:      Effort: Pulmonary effort is normal. No respiratory distress.     Breath sounds: Normal breath sounds. No wheezing or rales.  Chest:     Chest Linarez: No tenderness.  Musculoskeletal:        General: Swelling and tenderness present.     Cervical back: Normal range of motion and neck supple.     Right lower leg: Tenderness present. No bony tenderness. 1+ Pitting Edema present.     Left lower leg: Tenderness present. No bony tenderness. 1+ Pitting Edema present.  Neurological:     Mental Status: She is alert and oriented to person, place, and time.      No results found for any visits on 08/18/22.  Last CBC Lab Results  Component Value Date   WBC 9.5 05/05/2022   HGB 13.9 05/05/2022   HCT 42.2 05/05/2022   MCV 86.7 05/05/2022   MCH 28.3 08/29/2021   RDW 15.2 05/05/2022   PLT 309.0 05/05/2022   Last metabolic panel Lab Results  Component Value Date   GLUCOSE 127 (H) 07/21/2022   NA 142 07/21/2022   K 3.8 07/21/2022   CL 105 07/21/2022   CO2 29 07/21/2022   BUN 12 07/21/2022   CREATININE 0.78 07/21/2022   GFRNONAA >60 08/29/2021   CALCIUM 9.1 07/21/2022   PROT 6.5 07/21/2022   ALBUMIN 3.9 07/21/2022   BILITOT 0.3 07/21/2022   ALKPHOS 81 07/21/2022   AST 25 07/21/2022   ALT 30 07/21/2022   ANIONGAP 7 08/29/2021   Last lipids Lab Results  Component Value Date   CHOL 151 07/21/2022   HDL 29.80 (L) 07/21/2022   LDLCALC 101 (H) 10/28/2021   LDLDIRECT 100.0 07/21/2022   TRIG 297.0 (H) 07/21/2022   CHOLHDL 5 07/21/2022   Last hemoglobin A1c Lab Results  Component Value Date   HGBA1C 6.6 (H) 07/28/2022   Last thyroid functions Lab Results  Component Value Date   TSH 1.00 05/05/2022   Last  vitamin D No results found for: "25OHVITD2", "25OHVITD3", "VD25OH" Last vitamin B12 and Folate No results found for: "VITAMINB12", "FOLATE"    The 10-year ASCVD risk score (Arnett DK, et al., 2019) is: 5.3%    Assessment & Plan:   Problem List Items Addressed This  Visit       Unprioritized   Edema, lower extremity    Elevate legs Compression socks Lasix daily  D/c hctz OJ/ banana daily       Relevant Medications   furosemide (LASIX) 40 MG tablet   NONFORMULARY OR COMPOUNDED ITEM   Other Relevant Orders   Ambulatory referral to Vascular Surgery   Pain of left calf    Compression socks Art US/ ABI ordered Vascular referral       Relevant Orders   US ARTERIAL ABI (SCREENING LOWER EXTREMITY)   Ambulatory referral to Vascular Surgery   Pain of right calf - Primary    Compression socks Art US/ ABI and vascular referral       Relevant Orders   US ARTERIAL ABI (SCREENING LOWER EXTREMITY)   Ambulatory referral to Vascular Surgery    No follow-ups on file.    Donato Schultz, DO

## 2022-08-18 NOTE — Assessment & Plan Note (Signed)
Compression socks Art US/ ABI ordered Vascular referral

## 2022-08-18 NOTE — Assessment & Plan Note (Signed)
Compression socks Art US/ ABI and vascular referral

## 2022-08-25 ENCOUNTER — Encounter: Payer: Self-pay | Admitting: Family Medicine

## 2022-08-29 ENCOUNTER — Ambulatory Visit (HOSPITAL_COMMUNITY)
Admission: RE | Admit: 2022-08-29 | Discharge: 2022-08-29 | Disposition: A | Discharge status: Home / Self Care | Payer: BC Managed Care – PPO | Source: Ambulatory Visit | Attending: Family Medicine | Admitting: Family Medicine

## 2022-08-29 DIAGNOSIS — M79661 Pain in right lower leg: Secondary | ICD-10-CM | POA: Diagnosis not present

## 2022-08-29 DIAGNOSIS — M79662 Pain in left lower leg: Secondary | ICD-10-CM | POA: Insufficient documentation

## 2022-08-29 DIAGNOSIS — R600 Localized edema: Secondary | ICD-10-CM | POA: Diagnosis not present

## 2022-08-29 LAB — VAS US ABI WITH/WO TBI: Right ABI: 1.1

## 2022-08-30 LAB — VAS US ABI WITH/WO TBI: Left ABI: 1.2

## 2022-09-03 ENCOUNTER — Other Ambulatory Visit: Payer: Self-pay | Admitting: Family Medicine

## 2022-09-03 DIAGNOSIS — M170 Bilateral primary osteoarthritis of knee: Secondary | ICD-10-CM

## 2022-09-25 ENCOUNTER — Telehealth (INDEPENDENT_AMBULATORY_CARE_PROVIDER_SITE_OTHER): Payer: BC Managed Care – PPO | Admitting: Family Medicine

## 2022-09-25 ENCOUNTER — Encounter: Payer: Self-pay | Admitting: Family Medicine

## 2022-09-25 DIAGNOSIS — J0140 Acute pansinusitis, unspecified: Secondary | ICD-10-CM | POA: Diagnosis not present

## 2022-09-25 DIAGNOSIS — I10 Essential (primary) hypertension: Secondary | ICD-10-CM | POA: Diagnosis not present

## 2022-09-25 DIAGNOSIS — R051 Acute cough: Secondary | ICD-10-CM

## 2022-09-25 MED ORDER — FLUCONAZOLE 150 MG PO TABS: 150.0000 mg | ORAL_TABLET | Freq: Every day | ORAL | 2 refills | Status: DC

## 2022-09-25 MED ORDER — DOXYCYCLINE HYCLATE 100 MG PO TABS
100.0000 mg | ORAL_TABLET | Freq: Two times a day (BID) | ORAL | 0 refills | Status: DC
Start: 2022-09-25 — End: 2022-12-01

## 2022-09-25 MED ORDER — PROMETHAZINE-DM 6.25-15 MG/5ML PO SYRP
5.0000 mL | ORAL_SOLUTION | Freq: Four times a day (QID) | ORAL | 0 refills | Status: DC | PRN
Start: 2022-09-25 — End: 2022-12-01

## 2022-09-25 NOTE — Assessment & Plan Note (Signed)
Flonase and antihistamine Abx for 10 days

## 2022-09-25 NOTE — Telephone Encounter (Signed)
Pt had virtual visit today 

## 2022-09-25 NOTE — Progress Notes (Signed)
MyChart Video Visit    Virtual Visit via Video Note   This patient is at least at moderate risk for complications without adequate follow up. This format is felt to be most appropriate for this patient at this time. Physical exam was limited by quality of the video and audio technology used for the visit. Herbert Seta was able to get the patient set up on a video visit.  Patient location: home Patient and provider in visit Provider location: Office  I discussed the limitations of evaluation and management by telemedicine and the availability of in person appointments. The patient expressed understanding and agreed to proceed.  Visit Date: 09/25/2022  Today's healthcare provider: Donato Schultz, DO     Subjective:    Patient ID: Deborah Watts, female    DOB: [birth year], 57 y.o.   MRN: [id number]  Chief Complaint  Patient presents with   Sinus Problem    HPI Patient is in today for sinus congestion and cough. Discussed the use of AI scribe software for clinical note transcription with the patient, who gave verbal consent to proceed.  History of Present Illness   The patient, with a history of leg edema managed with Lasix, presents with a three-day history of headache, nasal congestion, and sore throat. She describes the headache as located in the frontal region and associated with nasal congestion. She reports green nasal discharge and ear pain. She has been self-managing with Sudafed and nasal spray. She also reports a new onset of throat pain on one side. She denies having a COVID test despite the current pandemic.  In addition to the above symptoms, she reports persistent swelling in her left leg despite taking 40mg  of Lasix daily. She is scheduled to see a vascular surgeon in August for further evaluation. She also reports a recent normal ultrasound of the legs.  She also requests a refill for Diflucan for yeast infection and a cough suppressant for a nocturnal cough.       Past Medical History:  Diagnosis Date   Anxiety    Arthritis    Asthma    Headache    migraines   History of hiatal hernia    repaired 30 yrs ago in [place]   Hypertension    MHA (microangiopathic hemolytic anemia) (HCC)     Past Surgical History:  Procedure Laterality Date   APPENDECTOMY N/A    LAPAROSCOPIC CHOLECYSTECTOMY N/A 03/1992   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   [facility]2021   [facility]   TOTAL ABDOMINAL HYSTERECTOMY Right    TUBAL LIGATION     WRIST ARTHROSCOPY Right 02/12/2018   Procedure: Right wrist arthroscopy, evaluation under anesthesia with debridement and repair as necessary and right carpal tunnel release;  Surgeon: Dominica Severin, MD;  Location: [facility] OR;  Service: Orthopedics;  Laterality: Right;  90 mins    Family History  Problem Relation Age of Onset   Throat cancer Father    Kidney failure Father    Asthma Other    Diabetes Other    Hyperlipidemia Other     Social History   Socioeconomic History   Marital status: Married    Spouse name: Not on file   Number of children: Not on file   Years of education: Not on file   Highest education level: Some college, no degree  Occupational History   Occupation: Administrator, 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[place]  Tobacco Use  Smoking status: Never   Smokeless tobacco: Never  Vaping Use   Vaping status: Never Used  Substance and Sexual Activity   Alcohol use: Yes    Alcohol/week: 0.0 standard drinks of alcohol    Comment: occassional   Drug use: No   Sexual activity: Yes    Partners: Male  Other Topics Concern   Not on file  Social History Narrative   Exercise-- no   Social Determinants of Health   Financial Resource Strain: Low Risk  (06/10/2022)   Overall Financial Resource Strain (CARDIA)    Difficulty of Paying Living Expenses: Not hard at all  Recent Concern: Financial Resource Strain - Medium Risk (03/19/2022)   Received from [facility],  [facility]   Overall Financial Resource Strain (CARDIA)    Difficulty of Paying Living Expenses: Somewhat hard  Food Insecurity: No Food Insecurity (06/10/2022)   Hunger Vital Sign    Worried About Running Out of Food in the Last Year: Never true    Ran Out of Food in the Last Year: Never true  Transportation Needs: No Transportation Needs (06/10/2022)   PRAPARE - Administrator, Civil Service (Medical): No    Lack of Transportation (Non-Medical): No  Physical Activity: Insufficiently Active (06/10/2022)   Exercise Vital Sign    Days of Exercise per Week: 3 days    Minutes of Exercise per Session: 10 min  Stress: No Stress Concern Present (06/10/2022)   [employer] of Occupational Health - Occupational Stress Questionnaire    Feeling of Stress : Not at all  Social Connections: Socially Integrated (06/10/2022)   Social Connection and Isolation Panel [NHANES]    Frequency of Communication with Friends and Family: More than three times a week    Frequency of Social Gatherings with Friends and Family: Once a week    Attends Religious Services: More than 4 times per year    Active Member of [employer] or Organizations: Yes    Attends Engineer, structural: More than 4 times per year    Marital Status: Married  Catering manager Violence: Not At Risk (03/19/2022)   Received from [facility], [facility]   HITS    Over the last 12 months how often did your partner physically hurt you?: 1    Over the last 12 months how often did your partner insult you or talk down to you?: 1    Over the last 12 months how often did your partner threaten you with physical harm?: 1    Over the last 12 months how often did your partner scream or curse at you?: 1    Outpatient Medications Prior to Visit  Medication Sig Dispense Refill   albuterol (VENTOLIN HFA) 108 (90 Base) MCG/ACT inhaler Inhale 2 puffs into the lungs every 6 (six) hours as needed for wheezing or shortness of breath  (Cough). 18 g 3   ALPRAZolam (XANAX) 0.5 MG tablet TAKE 1 TABLET BY MOUTH THREE TIMES DAILYAS NEEDED 90 tablet 1   diclofenac (VOLTAREN) 75 MG EC tablet TAKE 1 TABLET BY MOUTH TWICE DAILY 60 tablet 1   escitalopram (LEXAPRO) 20 MG tablet Take 1 tablet (20 mg total) by mouth daily. 90 tablet 3   fluticasone (FLONASE) 50 MCG/ACT nasal spray Place 2 sprays into both nostrils daily. 16 g 0   fluticasone-salmeterol (ADVAIR) 100-50 MCG/ACT AEPB INHALE 2 DOSES BY MOUTH TWICE DAILY 180 each 0   furosemide (LASIX) 40 MG tablet Take 1 tablet (40 mg  total) by mouth daily. 30 tablet 3   ipratropium (ATROVENT) 0.03 % nasal spray Place 2 sprays into both nostrils every 12 (twelve) hours. 30 mL 0   Ketorolac Tromethamine (TORADOL ORAL PO) Take 10 mg by mouth every 6 (six) hours as needed.     Meclizine HCl (TRAVEL SICKNESS) 25 MG CHEW CHEW 1 TABLET EVERY 6 HOURS AS NEEDED 30 tablet 1   methocarbamol (ROBAXIN-750) 750 MG tablet Take 1 tablet (750 mg total) by mouth every 6 (six) hours as needed for muscle spasms. 45 tablet 0   montelukast (SINGULAIR) 10 MG tablet Take 1 tablet (10 mg total) by mouth at bedtime. 90 tablet 3   NONFORMULARY OR COMPOUNDED ITEM Compression socks  20-30 mm hg #1 as directed 1 each 1   nystatin (MYCOSTATIN) 100000 UNIT/ML suspension Take 5 mLs (500,000 Units total) by mouth 4 (four) times daily. 60 mL 0   pregabalin (LYRICA) 225 MG capsule Take 225 mg by mouth 2 (two) times daily.     SUMAtriptan (IMITREX) 100 MG tablet TAKE 1 TABLET BY MOUTH AS NEEDED FOR MIGRAINE. REPEAT IN 2 HOURS IF NEEDED 12 tablet 3   topiramate (TOPAMAX) 50 MG tablet TAKE ONE TABLET BY MOUTH TWICE DAILY 180 tablet 1   traMADol (ULTRAM) 50 MG tablet Take 1 tablet (50 mg total) by mouth every 8 (eight) hours as needed. 30 tablet 0   triamcinolone cream (KENALOG) 0.1 % Apply 1 Application topically 2 (two) times daily. 30 g 0   doxycycline (VIBRA-TABS) 100 MG tablet Take 1 tablet (100 mg total) by mouth 2 (two)  times daily. 20 tablet 0   predniSONE (DELTASONE) 10 MG tablet TAKE 3 TABLETS PO QD FOR 3 DAYS THEN TAKE 2 TABLETS PO QD FOR 3 DAYS THEN TAKE 1 TABLET PO QD FOR 3 DAYS THEN TAKE 1/2 TAB PO QD FOR 3 DAYS 20 tablet 0   predniSONE (DELTASONE) 50 MG tablet 1 po 13h, 7 h and 1 hr prior to procedure 3 tablet 0   No facility-administered medications prior to visit.    Allergies  Allergen Reactions   Fish Allergy Hives and Swelling   Penicillins Anaphylaxis, Hives and Other (See Comments)    Has patient had a PCN reaction causing immediate rash, facial/tongue/throat swelling, SOB or lightheadedness with hypotension: Unknown Has patient had a PCN reaction causing severe rash involving mucus membranes or skin necrosis: No Has patient had a PCN reaction that required hospitalization: Yes Has patient had a PCN reaction occurring within the last 10 years: No If all of the above answers are "NO", then may proceed with Cephalosporin use.    Tizanidine Hcl Nausea And Vomiting, Rash and Shortness Of Breath   Iohexol Other (See Comments)     Desc: hives, sob, pt. needs 13 hr prep   01/16/05    Ivp Dye [Iodinated Contrast Media] Hives and Swelling   Morphine And Codeine Nausea And Vomiting    Review of Systems  Constitutional:  Negative for fever and malaise/fatigue.  HENT:  Negative for congestion.   Eyes:  Negative for blurred vision.  Respiratory:  Negative for cough and shortness of breath.   Cardiovascular:  Negative for chest pain, palpitations and leg swelling.  Gastrointestinal:  Negative for abdominal pain, blood in stool, nausea and vomiting.  Genitourinary:  Negative for dysuria and frequency.  Musculoskeletal:  Negative for back pain and falls.  Skin:  Negative for rash.  Neurological:  Negative for dizziness, loss of consciousness and headaches.  Endo/Heme/Allergies:  Negative for environmental allergies.  Psychiatric/Behavioral:  Negative for depression. The patient is not  nervous/anxious.        Objective:    Physical Exam Vitals and nursing note reviewed.  HENT:     Nose: Congestion present.     Right Sinus: Maxillary sinus tenderness and frontal sinus tenderness present.     Left Sinus: Maxillary sinus tenderness and frontal sinus tenderness present.  Pulmonary:     Effort: Pulmonary effort is normal.     Breath sounds: Wheezing present.  Neurological:     Mental Status: She is alert.     There were no vitals taken for this visit. Wt Readings from Last 3 Encounters:  08/18/22 223 lb 12.8 oz (101.5 kg)  08/12/22 225 lb 9.6 oz (102.3 kg)  07/21/22 226 lb 9.6 oz (102.8 kg)       Assessment & Plan:  Acute non-recurrent pansinusitis Assessment & Plan: Flonase and antihistamine Abx for 10 days   Orders: -     Doxycycline Hyclate; Take 1 tablet (100 mg total) by mouth 2 (two) times daily.  Dispense: 20 tablet; Refill: 0  Acute cough -     Promethazine-DM; Take 5 mLs by mouth 4 (four) times daily as needed.  Dispense: 118 mL; Refill: 0  Primary hypertension -     Comprehensive metabolic panel; Future  Other orders -     Fluconazole; Take 1 tablet (150 mg total) by mouth daily. May repeat in 3 days as needed  Dispense: 2 tablet; Refill: 2     I discussed the assessment and treatment plan with the patient. The patient was provided an opportunity to ask questions and all were answered. The patient agreed with the plan and demonstrated an understanding of the instructions.   The patient was advised to call back or seek an in-person evaluation if the symptoms worsen or if the condition fails to improve as anticipated.  Donato Schultz, DO [facility] at [facility] [phone] (phone) [phone] (fax)  [facility]

## 2022-09-26 ENCOUNTER — Encounter: Payer: Self-pay | Admitting: Family Medicine

## 2022-09-29 ENCOUNTER — Other Ambulatory Visit (INDEPENDENT_AMBULATORY_CARE_PROVIDER_SITE_OTHER): Payer: BC Managed Care – PPO

## 2022-09-29 DIAGNOSIS — I10 Essential (primary) hypertension: Secondary | ICD-10-CM

## 2022-09-29 LAB — COMPREHENSIVE METABOLIC PANEL
ALT: 33 U/L (ref 0–35)
AST: 28 U/L (ref 0–37)
Albumin: 4.3 g/dL (ref 3.5–5.2)
Alkaline Phosphatase: 82 U/L (ref 39–117)
BUN: 14 mg/dL (ref 6–23)
CO2: 30 mEq/L (ref 19–32)
Calcium: 9.9 mg/dL (ref 8.4–10.5)
Chloride: 107 mEq/L (ref 96–112)
Creatinine, Ser: 0.75 mg/dL (ref 0.40–1.20)
GFR: 87.86 mL/min (ref >60.00)
Glucose, Bld: 96 mg/dL (ref 70–99)
Potassium: 4.4 mEq/L (ref 3.5–5.1)
Sodium: 144 mEq/L (ref 135–145)
Total Bilirubin: 0.5 mg/dL (ref 0.2–1.2)
Total Protein: 7.1 g/dL (ref 6.0–8.3)

## 2022-10-01 ENCOUNTER — Encounter: Payer: Self-pay | Admitting: Family Medicine

## 2022-10-01 ENCOUNTER — Other Ambulatory Visit: Payer: Self-pay | Admitting: Family Medicine

## 2022-10-01 MED ORDER — PREGABALIN 225 MG PO CAPS: 225.0000 mg | ORAL_CAPSULE | Freq: Two times a day (BID) | ORAL | 1 refills | Status: DC

## 2022-10-04 ENCOUNTER — Other Ambulatory Visit: Payer: Self-pay | Admitting: Family Medicine

## 2022-10-04 DIAGNOSIS — J0140 Acute pansinusitis, unspecified: Secondary | ICD-10-CM

## 2022-10-06 ENCOUNTER — Other Ambulatory Visit: Payer: Self-pay | Admitting: *Deleted

## 2022-10-06 ENCOUNTER — Other Ambulatory Visit: Payer: Self-pay | Admitting: Family Medicine

## 2022-10-06 DIAGNOSIS — F411 Generalized anxiety disorder: Secondary | ICD-10-CM

## 2022-10-06 DIAGNOSIS — R600 Localized edema: Secondary | ICD-10-CM

## 2022-10-06 NOTE — Telephone Encounter (Signed)
Requesting: Xanax  Contract: N/A UDS: 05/02/2021 Last Visit:08/18/2022 Next Visit: 05/08/2023 Last Refill: 08/04/2022  Please Advise

## 2022-10-27 ENCOUNTER — Ambulatory Visit (HOSPITAL_COMMUNITY)
Admission: RE | Admit: 2022-10-27 | Discharge: 2022-10-27 | Disposition: A | Discharge status: Home / Self Care | Payer: BC Managed Care – PPO | Source: Ambulatory Visit | Attending: Surgery | Admitting: Surgery

## 2022-10-27 ENCOUNTER — Ambulatory Visit (INDEPENDENT_AMBULATORY_CARE_PROVIDER_SITE_OTHER): Payer: BC Managed Care – PPO | Admitting: Physician Assistant

## 2022-10-27 VITALS — BP 115/46 | HR 75 | Temp 97.2°F | Resp 18 | Ht 59.0 in | Wt 225.3 lb

## 2022-10-27 DIAGNOSIS — R600 Localized edema: Secondary | ICD-10-CM

## 2022-10-27 NOTE — Progress Notes (Signed)
Requested by:  Zola Button, Yvonne R, DO [address] [place],  [place] [address]  Reason for consultation: pain in R Calf, leg edema    History of Present Illness   Deborah Watts is a 57 y.o. (December 05, 1965) female who presents for evaluation of pain and swelling in Right leg> left. She says this has been going on for < 1 year. She gets swelling in both legs and legs are very achy and cramp at night. She explains that she works at [employer] and is on her feet for 8 ours a day. She has been elevating her legs which does help. She also has tried 20-30 mmHg knee high compression for 1 week but she says that it made her legs feel worse. Says she felt like they caused a lot of pressure and swelling around her knees and she has had her knees replaced so it was very uncomfortable. She does not exercise and says she understands she needs to loose weight. She has no history of DVT. No history of vein issues in her family.   Venous symptoms include: aching,swelling, cramping Onset/duration:  < 1 year  Occupation:  Airline pilot, Statistician Aggravating factors: prolonged standing, ambulation Alleviating factors: elevation Compression:  yes, 20-30 knee high Helps:  no Pain medications: Tramadol for chronic back pain Previous vein procedures:  none History of DVT:  no  Past Medical History:  Diagnosis Date   Anxiety    Arthritis    Asthma    Headache    migraines   History of hiatal hernia    repaired 30 yrs ago in [place]   Hypertension    MHA (microangiopathic hemolytic anemia) (HCC)     Past Surgical History:  Procedure Laterality Date   APPENDECTOMY N/A    LAPAROSCOPIC CHOLECYSTECTOMY N/A 03/1992   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   RE[facility]ACEMENT TOTAL [facility]NEE Right [facility]/2021   [facility]   TOTAL ABDOMINAL HYSTERECTOMY Right    TUBAL LIGATION     WRIST ARTHROSCOPY Right 02/12/2018   Procedure: Right wrist arthroscopy, evaluation under anesthesia with debridement and  repair as necessary and right carpal tunnel release;  Surgeon: Dominica Severin, MD;  Location: [facility] OR;  Service: Orthopedics;  Laterality: Right;  90 mins    Social History   Socioeconomic History   Marital status: Married    Spouse name: Not on file   Number of children: Not on file   Years of education: Not on file   Highest education level: Some college, no degree  Occupational History   Occupation: Administrator, 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[place]  Tobacco Use   Smoking status: Never   Smokeless tobacco: Never  Vaping Use   Vaping status: Never Used  Substance and Sexual Activity   Alcohol use: Yes    Alcohol/week: 0.0 standard drinks of alcohol    Comment: occassional   Drug use: No   Sexual activity: Yes    Partners: Male  Other Topics Concern   Not on file  Social History Narrative   Exercise-- no   Social Determinants of Health   Financial Resource Strain: Low Risk  (06/10/2022)   Overall Financial Resource Strain (CARDIA)    Difficulty of Paying Living Expenses: Not hard at all  Recent Concern: Financial Resource Strain - Medium Risk (03/19/2022)   [facility]   Overall Financial Resource Strain (CARDIA)    Difficulty of Paying Living Expenses: Somewhat  hard  Food Insecurity: No Food Insecurity (06/10/2022)   Hunger Vital Sign    Worried About Running Out of Food in the Last Year: Never true    Ran Out of Food in the Last Year: Never true  Transportation Needs: No Transportation Needs (06/10/2022)   PRAPARE - Administrator, Civil Service (Medical): No    Lack of Transportation (Non-Medical): No  Physical Activity: Insufficiently Active (06/10/2022)   Exercise Vital Sign    Days of Exercise per Week: 3 days    Minutes of Exercise per Session: 10 min  Stress: No Stress Concern Present (06/10/2022)   [employer] of Occupational Health - Occupational Stress Questionnaire    Feeling of Stress : Not at all  Social  Connections: Socially Integrated (06/10/2022)   Social Connection and Isolation Panel [NHANES]    Frequency of Communication with Friends and Family: More than three times a week    Frequency of Social Gatherings with Friends and Family: Once a week    Attends Religious Services: More than 4 times per year    Active Member of [employer] or Organizations: Yes    Attends Engineer, structural: More than 4 times per year    Marital Status: Married  Catering manager Violence: Not At Risk (03/19/2022)   Received from [facility], [facility]   HITS    Over the last 12 months how often did your partner physically hurt you?: 1    Over the last 12 months how often did your partner insult you or talk down to you?: 1    Over the last 12 months how often did your partner threaten you with physical harm?: 1    Over the last 12 months how often did your partner scream or curse at you?: 1    Family History  Problem Relation Age of Onset   Throat cancer Father    Kidney failure Father    Asthma Other    Diabetes Other    Hyperlipidemia Other     Current Outpatient Medications  Medication Sig Dispense Refill   albuterol (VENTOLIN HFA) 108 (90 Base) MCG/ACT inhaler Inhale 2 puffs into the lungs every 6 (six) hours as needed for wheezing or shortness of breath (Cough). 18 g 3   ALPRAZolam (XANAX) 0.5 MG tablet TAKE 1 TABLET BY MOUTH THREE TIMES DAILYAS NEEDED 90 tablet 0   diclofenac (VOLTAREN) 75 MG EC tablet TAKE 1 TABLET BY MOUTH TWICE DAILY 60 tablet 1   doxycycline (VIBRA-TABS) 100 MG tablet Take 1 tablet (100 mg total) by mouth 2 (two) times daily. 20 tablet 0   escitalopram (LEXAPRO) 20 MG tablet Take 1 tablet (20 mg total) by mouth daily. 90 tablet 3   fluconazole (DIFLUCAN) 150 MG tablet Take 1 tablet (150 mg total) by mouth daily. May repeat in 3 days as needed 2 tablet 2   fluticasone (FLONASE) 50 MCG/ACT nasal spray Place 2 sprays into both nostrils daily. 16 g 0    fluticasone-salmeterol (ADVAIR) 100-50 MCG/ACT AEPB INHALE 2 DOSES BY MOUTH TWICE DAILY 180 each 0   furosemide (LASIX) 40 MG tablet Take 1 tablet (40 mg total) by mouth daily. 30 tablet 3   ipratropium (ATROVENT) 0.03 % nasal spray Place 2 sprays into both nostrils every 12 (twelve) hours. 30 mL 0   Ketorolac Tromethamine (TORADOL ORAL PO) Take 10 mg by mouth every 6 (six) hours as needed.     Meclizine HCl (TRAVEL SICKNESS) 25 MG CHEW  CHEW 1 TABLET EVERY 6 HOURS AS NEEDED 30 tablet 1   methocarbamol (ROBAXIN-750) 750 MG tablet Take 1 tablet (750 mg total) by mouth every 6 (six) hours as needed for muscle spasms. 45 tablet 0   montelukast (SINGULAIR) 10 MG tablet Take 1 tablet (10 mg total) by mouth at bedtime. 90 tablet 3   NONFORMULARY OR COMPOUNDED ITEM Compression socks  20-30 mm hg #1 as directed 1 each 1   nystatin (MYCOSTATIN) 100000 UNIT/ML suspension Take 5 mLs (500,000 Units total) by mouth 4 (four) times daily. 60 mL 0   pregabalin (LYRICA) 225 MG capsule Take 1 capsule (225 mg total) by mouth 2 (two) times daily. 60 capsule 1   promethazine-dextromethorphan (PROMETHAZINE-DM) 6.25-15 MG/5ML syrup Take 5 mLs by mouth 4 (four) times daily as needed. 118 mL 0   SUMAtriptan (IMITREX) 100 MG tablet TAKE 1 TABLET BY MOUTH AS NEEDED FOR MIGRAINE. REPEAT IN 2 HOURS IF NEEDED 12 tablet 3   topiramate (TOPAMAX) 50 MG tablet TAKE ONE TABLET BY MOUTH TWICE DAILY 180 tablet 1   traMADol (ULTRAM) 50 MG tablet Take 1 tablet (50 mg total) by mouth every 8 (eight) hours as needed. 30 tablet 0   triamcinolone cream (KENALOG) 0.1 % Apply 1 Application topically 2 (two) times daily. 30 g 0   No current facility-administered medications for this visit.    Allergies  Allergen Reactions   Fish Allergy Hives and Swelling   Penicillins Anaphylaxis, Hives and Other (See Comments)    Has patient had a PCN reaction causing immediate rash, facial/tongue/throat swelling, SOB or lightheadedness with hypotension:  Unknown Has patient had a PCN reaction causing severe rash involving mucus membranes or skin necrosis: No Has patient had a PCN reaction that required hospitalization: Yes Has patient had a PCN reaction occurring within the last 10 years: No If all of the above answers are "NO", then may proceed with Cephalosporin use.    Tizanidine Hcl Nausea And Vomiting, Rash and Shortness Of Breath   Iohexol Other (See Comments)     Desc: hives, sob, pt. needs 13 hr prep   01/16/05    Ivp Dye [Iodinated Contrast Media] Hives and Swelling   Morphine And Codeine Nausea And Vomiting    REVIEW OF SYSTEMS (negative unless checked):   Cardiac:  []  Chest pain or chest pressure? []  Shortness of breath upon activity? []  Shortness of breath when lying flat? []  Irregular heart rhythm?  Vascular:  []  Pain in calf, thigh, or hip brought on by walking? []  Pain in feet at night that wakes you up from your sleep? []  Blood clot in your veins? []  Leg swelling?  Pulmonary:  []  Oxygen at home? []  Productive cough? []  Wheezing?  Neurologic:  []  Sudden weakness in arms or legs? []  Sudden numbness in arms or legs? []  Sudden onset of difficult speaking or slurred speech? []  Temporary loss of vision in one eye? []  Problems with dizziness?  Gastrointestinal:  []  Blood in stool? []  Vomited blood?  Genitourinary:  []  Burning when urinating? []  Blood in urine?  Psychiatric:  []  Major depression  Hematologic:  []  Bleeding problems? []  Problems with blood clotting?  Dermatologic:  []  Rashes or ulcers?  Constitutional:  []  Fever or chills?  Ear/Nose/Throat:  []  Change in hearing? []  Nose bleeds? []  Sore throat?  Musculoskeletal:  []  Back pain? []  Joint pain? []  Muscle pain?   Physical Examination     Vitals:   10/27/22 1228  BP: (!) 115/46  Pulse: 75  Resp: 18  Temp: (!) 97.2 F (36.2 C)  TempSrc: Temporal  SpO2: 94%  Weight: 225 lb 4.8 oz (102.2 kg)  Height: 4\' 11"  (1.499 m)    Body mass index is 45.51 kg/m.  General:  WDWN in NAD; vital signs documented above Gait: Normal HENT: WNL, normocephalic Pulmonary: normal non-labored breathing Cardiac: regular HR Abdomen: soft Vascular Exam/Pulses: 2+ DP and PT pulses bilaterally, feet warm and well perfused Extremities: without varicose veins, with reticular veins, without edema, without stasis pigmentation, without lipodermatosclerosis, without ulcers Musculoskeletal: no muscle wasting or atrophy  Neurologic: A&O X 3;  No focal weakness or paresthesias are detected Psychiatric:  The pt has Normal affect.  Non-invasive Vascular Imaging   BLE Venous Insufficiency Duplex (10/27/22):  RLE:  No DVT and SVT No GSV reflux GSV diameter 0.33-0.59 cm No SSV reflux  No deep venous reflux   Medical Decision Making   RAKITA JESSER is a 57 y.o. female who presents with pain and swelling of right >left leg that has been going on for < 1 year. On exam she does have a very mild amount of swelling in her legs. She does not have any varicose veins or spider veins. Her duplex today shows no DVT or Svt. No GSV, SSV or deep venous reflux. Based on today's  study she is not a candidate for any venous intervention.  Based on the patient's history and examination, I recommend: daily elevation 20-30 minutes, knee high compression stockings, exercise, weight reduction, and refraining from prolonged sitting or standing. She lives near [facility] in [place] so she would like to go there to get other stockings. I have recommended the 15-20 mmHg knee high She will follow up as needed if she has any new or concerning symptoms   Graceann Congress, PA-C [facility] Office: [phone]  10/27/2022, 12:43 PM  Clinic MD: Myra Gianotti

## 2022-11-07 ENCOUNTER — Ambulatory Visit: Payer: BC Managed Care – PPO | Admitting: Family Medicine

## 2022-11-07 ENCOUNTER — Encounter: Payer: Self-pay | Admitting: Family Medicine

## 2022-11-07 VITALS — BP 140/90 | HR 111 | Temp 99.2°F | Resp 22 | Ht 59.0 in | Wt 222.2 lb

## 2022-11-07 DIAGNOSIS — J029 Acute pharyngitis, unspecified: Secondary | ICD-10-CM

## 2022-11-07 DIAGNOSIS — J020 Streptococcal pharyngitis: Secondary | ICD-10-CM

## 2022-11-07 DIAGNOSIS — R058 Other specified cough: Secondary | ICD-10-CM | POA: Diagnosis not present

## 2022-11-07 LAB — POCT RAPID STREP A (OFFICE): Rapid Strep A Screen: POSITIVE — AB

## 2022-11-07 LAB — POC COVID19 BINAXNOW: SARS Coronavirus 2 Ag: NEGATIVE

## 2022-11-07 MED ORDER — AZITHROMYCIN 250 MG PO TABS
ORAL_TABLET | ORAL | 0 refills | Status: DC
Start: 2022-11-07 — End: 2022-12-01

## 2022-11-07 MED ORDER — PROMETHAZINE-DM 6.25-15 MG/5ML PO SYRP
5.0000 mL | ORAL_SOLUTION | Freq: Four times a day (QID) | ORAL | 0 refills | Status: DC | PRN
Start: 2022-11-07 — End: 2022-12-01

## 2022-11-07 MED ORDER — PREDNISONE 10 MG PO TABS
ORAL_TABLET | ORAL | 0 refills | Status: DC
Start: 2022-11-07 — End: 2022-12-01

## 2022-11-07 NOTE — Progress Notes (Signed)
Established Patient Office Visit  Subjective   Patient ID: Deborah Watts, female    DOB: 10/31/1965  Age: 57 y.o. MRN: [id number]  Chief Complaint  Patient presents with   Wheezing    Sxs started yesterday, pt has wheezing, SOB, lost voice, productive cough    HPI Discussed the use of AI scribe software for clinical note transcription with the patient, who gave verbal consent to proceed.  History of Present Illness   The patient presents with upper respiratory symptoms that started 'yesterday.' She describes a sensation of 'something sitting on my chest,' and has been experiencing a sore throat. She has been around others who have been sick recently. She has been taking over-the-counter Robitussin, but it is not providing sufficient relief and causes her to choke. She also uses two inhalers at home, Advair and albuterol, for an unspecified respiratory condition. She has been feeling nauseous and has difficulty speaking loudly due to her symptoms.      Patient Active Problem List   Diagnosis Date Noted   Pain of left calf 08/12/2022   Cellulitis of left lower extremity 08/12/2022   Edema, lower extremity 07/21/2022   SOB (shortness of breath) 07/21/2022   Hyperlipidemia 07/21/2022   Major depressive disorder, recurrent episode, moderate (HCC) 11/25/2021   Generalized anxiety disorder 11/25/2021   Depression with anxiety 10/28/2021   Asthma, chronic, unspecified asthma severity, uncomplicated 05/02/2021   Loud snoring 11/28/2019   Daytime sleepiness 11/28/2019   Other fatigue 11/28/2019   Primary osteoarthritis of left knee 08/02/2019   Rib pain 10/19/2018   Right knee pain 04/12/2017   Preventative health  care 03/25/2016   Left ankle pain 01/14/2016   Sinusitis, acute 12/22/2014   Left leg pain 04/28/2014   Pain of right calf 04/20/2014   Acute bacterial sinusitis 04/14/2014   Obesity (BMI 30-39.9) 01/06/2013   Influenza 03/10/2011   Thrush, oral 03/04/2011   TINEA CRURIS 02/05/2010   HYPOKALEMIA 04/26/2009   Primary hypertension 02/08/2008   LOW BACK PAIN SYNDROME 05/26/2007   LEG CRAMPS 05/14/2007   Edema 05/14/2007   Chronic migraine without aura, with status migrainosus 05/01/2007   ASTHMATIC BRONCHITIS, ACUTE 10/15/2006   Anxiety state 04/20/2006   Asthma with acute exacerbation 04/20/2006   RESTLESS LEG SYNDROME, HX OF 04/20/2006          +  0   Past Medical History:  Diagnosis Date   Anxiety    Arthritis    Asthma    Headache    migraines   History of hiatal hernia    repaired 30 yrs ago in [place]   Hypertension    MHA (microangiopathic hemolytic anemia) (HCC)    Past Surgical History:  Procedure Laterality Date   APPENDECTOMY N/A    LAPAROSCOPIC CHOLECYSTECTOMY N/A 03/1992   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   RE[facility]ACEMENT TOTAL KNEE Right 01/15/2021   [facility]   TOTAL ABDOMINAL HYSTERECTOMY Right    TUBAL LIGATION     WRIST ARTHROSCOPY Right 02/12/2018   Procedure: Right wrist arthroscopy, evaluation under anesthesia with debridement and repair as necessary and right carpal tunnel release;  Surgeon: Dominica Severin, MD;  Location: [facility] OR;  Service: Orthopedics;  Laterality: Right;  90 mins   Social History   Tobacco Use   Smoking status: Never   Smokeless tobacco: Never  Vaping Use   Vaping status: Never Used  Substance Use Topics   Alcohol use:  Yes    Alcohol/week: 0.0 standard drinks of alcohol    Comment: occassional   Drug use: No   Social History   Socioeconomic History   Marital status: Married    Spouse name: Not on file   Number of children: Not on file   Years of education: Not on file   Highest education level: Some college, no degree  Occupational History   Occupation: Administrator, 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[id number] [place]  Tobacco Use   Smoking status: Never   Smokeless tobacco: Never  Vaping Use   Vaping status: Never Used  Substance and Sexual Activity   Alcohol use: Yes    Alcohol/week: 0.0 standard drinks of alcohol    Comment: occassional   Drug use: No   Sexual activity: Yes    Partners: Male  Other Topics Concern   Not on file  Social History Narrative   Exercise-- no   Social Determinants of Health   Financial Resource Strain: Low Risk  (06/10/2022)   Overall Financial Resource Strain (CARDIA)    Difficulty of Paying Living Expenses: Not hard at all  Recent Concern: Financial Resource Strain - Medium Risk (03/19/2022)   Received from [facility], [facility]   Overall Financial Resource Strain (CARDIA)    Difficulty of Paying Living Expenses: Somewhat hard  Food Insecurity: No Food Insecurity (06/10/2022)   Hunger Vital Sign    Worried About Running Out of Food in the Last Year: Never true    Ran Out of Food in the Last Year: Never true  Transportation Needs: No Transportation Needs (06/10/2022)   PRAPARE - Administrator, Civil Service (Medical): No    Lack of Transportation (Non-Medical): No  Physical Activity: Insufficiently Active (06/10/2022)   Exercise Vital Sign    Days of Exercise per Week: 3 days    Minutes of Exercise per Session: 10 min  Stress: No Stress Concern Present (06/10/2022)   [employer] of Occupational Health - Occupational Stress Questionnaire    Feeling of Stress : Not at all  Social Connections: Socially Integrated (06/10/2022)   Social Connection and  Isolation Panel [NHANES]    Frequency of Communication with Friends and Family: More than three times a week    Frequency of Social Gatherings with Friends and Family: Once a week    Attends Religious Services: More than 4 times per year  Active Member of Clubs or Organizations: Yes    Attends Banker Meetings: More than 4 times per year    Marital Status: Married  Catering manager Violence: Not At Risk (03/19/2022)   Received from [facility]   HITS    Over the last 12 months how often did your partner physically hurt you?: 1    Over the last 12 months how often did your partner insult you or talk down to you?: 1    Over the last 12 months how often did your partner threaten you with physical harm?: 1    Over the last 12 months how often did your partner scream or curse at you?: 1   Family Status  Relation Name Status   Mother  Alive   Father  Deceased at age 97   Other  (Not Specified)  No partnership data on file   Family History  Problem Relation Age of Onset   Throat cancer Father    Kidney failure Father    Asthma Other    Diabetes Other    Hyperlipidemia Other    Allergies  Allergen Reactions   Fish Allergy Hives and Swelling   Penicillins Anaphylaxis, Hives and Other (See Comments)    Has patient had a PCN reaction causing immediate rash, facial/tongue/throat swelling, SOB or lightheadedness with hypotension: Unknown Has patient had a PCN reaction causing severe rash involving mucus membranes or skin necrosis: No Has patient had a PCN reaction that required hospitalization: Yes Has patient had a PCN reaction occurring within the last 10 years: No If all of the above answers are "NO", then may proceed with Cephalosporin use.    Tizanidine Hcl Nausea And Vomiting, Rash and Shortness Of Breath   Iohexol Other (See Comments)     Desc: hives, sob, pt. needs 13 hr prep   01/16/05    Ivp Dye [Iodinated Contrast Media] Hives and Swelling    Morphine And Codeine Nausea And Vomiting      Review of Systems  Constitutional:  Negative for fever and malaise/fatigue.  HENT:  Positive for sore throat. Negative for congestion.   Eyes:  Negative for blurred vision.  Respiratory:  Positive for wheezing. Negative for shortness of breath.   Cardiovascular:  Negative for chest pain, palpitations and leg swelling.  Gastrointestinal:  Negative for abdominal pain, blood in stool and nausea.  Genitourinary:  Negative for dysuria and frequency.  Musculoskeletal:  Negative for falls.  Skin:  Negative for rash.  Neurological:  Negative for dizziness, loss of consciousness and headaches.  Endo/Heme/Allergies:  Negative for environmental allergies.  Psychiatric/Behavioral:  Negative for depression. The patient is not nervous/anxious.       Objective:     BP (!) 140/90 (BP Location: Left Arm, Patient Position: Sitting, Cuff Size: Large)   Pulse (!) 111   Temp 99.2 F (37.3 C) (Oral)   Resp (!) 22   Ht 4\' 11"  (1.499 m)   Wt 222 lb 3.2 oz (100.8 kg)   SpO2 96%   BMI 44.88 kg/m  BP Readings from Last 3 Encounters:  11/07/22 (!) 140/90  10/27/22 (!) 115/46  08/18/22 138/82   Wt Readings from Last 3 Encounters:  11/07/22 222 lb 3.2 oz (100.8 kg)  10/27/22 225 lb 4.8 oz (102.2 kg)  08/18/22 223 lb 12.8 oz (101.5 kg)   SpO2 Readings from Last 3 Encounters:  11/07/22 96%  10/27/22 94%  08/18/22 99%      Physical Exam  Vitals and nursing note reviewed.  Constitutional:      General: She is not in acute distress.    Appearance: Normal appearance.  HENT:     Head: Normocephalic and atraumatic.     Right Ear: Tympanic membrane, ear canal and external ear normal. There is no impacted cerumen.     Left Ear: Tympanic membrane, ear canal and external ear normal. There is no impacted cerumen.     Nose: Nose normal. No congestion or rhinorrhea.     Right Sinus: No maxillary sinus tenderness or frontal sinus tenderness.     Left Sinus:  No maxillary sinus tenderness or frontal sinus tenderness.     Mouth/Throat:     Mouth: Mucous membranes are moist.     Pharynx: Oropharyngeal exudate and posterior oropharyngeal erythema present.  Eyes:     General: No scleral icterus.       Right eye: No discharge.        Left eye: No discharge.     Conjunctiva/sclera: Conjunctivae normal.  Cardiovascular:     Rate and Rhythm: Normal rate and regular rhythm.     Heart sounds: Normal heart sounds.  Pulmonary:     Effort: Pulmonary effort is normal. No respiratory distress.     Breath sounds: Wheezing present.  Musculoskeletal:     Cervical back: Normal range of motion and neck supple. No tenderness.  Lymphadenopathy:     Cervical: Cervical adenopathy present.  Skin:    General: Skin is warm and dry.  Neurological:     General: No focal deficit present.     Mental Status: She is alert and oriented to person, place, and time.  Psychiatric:        Mood and Affect: Mood normal.        Behavior: Behavior normal.        Thought Content: Thought content normal.        Judgment: Judgment normal.      Results for orders placed or performed in visit on 11/07/22  POC COVID-19  Result Value Ref Range   SARS Coronavirus 2 Ag Negative Negative  POCT rapid strep A  Result Value Ref Range   Rapid Strep A Screen Positive (A) Negative    Last CBC Lab Results  Component Value Date   WBC 9.5 05/05/2022   HGB 13.9 05/05/2022   HCT 42.2 05/05/2022   MCV 86.7 05/05/2022   MCH 28.3 08/29/2021   RDW 15.2 05/05/2022   PLT 309.0 05/05/2022   Last metabolic panel Lab Results  Component Value Date   GLUCOSE 96 09/29/2022   NA 144 09/29/2022   K 4.4 09/29/2022   CL 107 09/29/2022   CO2 30 09/29/2022   BUN 14 09/29/2022   CREATININE 0.75 09/29/2022   GFR 87.86 09/29/2022   CALCIUM 9.9 09/29/2022   PROT 7.1 09/29/2022   ALBUMIN 4.3 09/29/2022   BILITOT 0.5 09/29/2022   ALKPHOS 82 09/29/2022   AST 28 09/29/2022   ALT 33  09/29/2022   ANIONGAP 7 08/29/2021   Last lipids Lab Results  Component Value Date   CHOL 151 07/21/2022   HDL 29.80 (L) 07/21/2022   LDLCALC 101 (H) 10/28/2021   LDLDIRECT 100.0 07/21/2022   TRIG 297.0 (H) 07/21/2022   CHOLHDL 5 07/21/2022   Last hemoglobin A1c Lab Results  Component Value Date   HGBA1C 6.6 (H) 07/28/2022   Last thyroid functions Lab Results  Component Value Date   TSH 1.00 05/05/2022   Last  vitamin D No results found for: "25OHVITD2", "25OHVITD3", "VD25OH" Last vitamin B12 and Folate No results found for: "VITAMINB12", "FOLATE"    The 10-year ASCVD risk score (Arnett DK, et al., 2019) is: 5.4%    Assessment & Plan:   Problem List Items Addressed This Visit   None Visit Diagnoses     Strep throat    -  Primary   Relevant Medications   azithromycin (ZITHROMAX Z-PAK) 250 MG tablet   predniSONE (DELTASONE) 10 MG tablet   Productive cough       Relevant Orders   POC COVID-19 (Completed)   DG Chest 2 View   Sore throat       Relevant Orders   POCT rapid strep A (Completed)     Assessment and Plan    Strep Throat Positive rapid strep test. Sore throat, difficulty speaking, and exposure to others with strep throat. Penicillin allergy noted. -Start appropriate non-penicillin antibiotic therapy. -Advise patient to stay off work until Tuesday, 11/11/2022, to prevent spread of infection.  Asthma Patient reports use of Advair and Albuterol inhalers. -Continue current inhaler regimen.  COVID-19 Exposure Patient reports exposure to individuals with COVID-19. -Await results of COVID-19 test.        Return if symptoms worsen or fail to improve.    Donato Schultz, DO

## 2022-11-10 ENCOUNTER — Ambulatory Visit (HOSPITAL_BASED_OUTPATIENT_CLINIC_OR_DEPARTMENT_OTHER)
Admission: RE | Admit: 2022-11-10 | Discharge: 2022-11-10 | Disposition: A | Discharge status: Home / Self Care | Payer: BC Managed Care – PPO | Source: Ambulatory Visit | Attending: Family Medicine | Admitting: Family Medicine

## 2022-11-10 ENCOUNTER — Encounter: Payer: Self-pay | Admitting: Family Medicine

## 2022-11-10 DIAGNOSIS — R059 Cough, unspecified: Secondary | ICD-10-CM | POA: Diagnosis not present

## 2022-11-10 DIAGNOSIS — R062 Wheezing: Secondary | ICD-10-CM | POA: Diagnosis not present

## 2022-11-10 DIAGNOSIS — R058 Other specified cough: Secondary | ICD-10-CM | POA: Insufficient documentation

## 2022-11-11 ENCOUNTER — Encounter: Payer: Self-pay | Admitting: Family Medicine

## 2022-11-13 NOTE — Telephone Encounter (Signed)
Have you seen this just yet?

## 2022-11-13 NOTE — Telephone Encounter (Signed)
Not yet. Nothing in the S-drive or up front

## 2022-11-21 ENCOUNTER — Encounter: Payer: Self-pay | Admitting: Family Medicine

## 2022-11-24 ENCOUNTER — Other Ambulatory Visit: Payer: Self-pay | Admitting: Family Medicine

## 2022-11-24 ENCOUNTER — Encounter: Payer: Self-pay | Admitting: Family Medicine

## 2022-11-24 DIAGNOSIS — F411 Generalized anxiety disorder: Secondary | ICD-10-CM

## 2022-11-24 MED ORDER — PREGABALIN 225 MG PO CAPS: 225.0000 mg | ORAL_CAPSULE | Freq: Two times a day (BID) | ORAL | 1 refills | Status: DC

## 2022-11-24 MED ORDER — ALPRAZOLAM 0.5 MG PO TABS: ORAL_TABLET | ORAL | 0 refills | Status: DC

## 2022-11-24 NOTE — Telephone Encounter (Signed)
Duplicate request

## 2022-11-24 NOTE — Telephone Encounter (Signed)
Requesting: Lyrica 225mg  and alprazolam 0.5mg   Contract:  05/21/21 UDS: 05/02/21 Last Visit: 11/07/22 Next Visit: 11/25/22 Last Refill on Lyrica 225mg  10/01/22 #60 and 1RF  Last Refill on alprazolam: 10/06/22 #90 and 0RF   Please Advise

## 2022-11-25 ENCOUNTER — Telehealth (INDEPENDENT_AMBULATORY_CARE_PROVIDER_SITE_OTHER): Payer: BC Managed Care – PPO | Admitting: Family Medicine

## 2022-11-25 ENCOUNTER — Encounter: Payer: Self-pay | Admitting: Family Medicine

## 2022-11-25 DIAGNOSIS — R600 Localized edema: Secondary | ICD-10-CM | POA: Diagnosis not present

## 2022-11-25 DIAGNOSIS — J4521 Mild intermittent asthma with (acute) exacerbation: Secondary | ICD-10-CM | POA: Diagnosis not present

## 2022-11-25 MED ORDER — ERYTHROMYCIN BASE 500 MG PO TABS
500.0000 mg | ORAL_TABLET | Freq: Two times a day (BID) | ORAL | 0 refills | Status: DC
Start: 2022-11-25 — End: 2023-05-08

## 2022-11-25 MED ORDER — PROMETHAZINE-DM 6.25-15 MG/5ML PO SYRP
5.0000 mL | ORAL_SOLUTION | Freq: Four times a day (QID) | ORAL | 0 refills | Status: DC | PRN
Start: 2022-11-25 — End: 2023-05-08

## 2022-11-25 MED ORDER — FUROSEMIDE 40 MG PO TABS
40.0000 mg | ORAL_TABLET | Freq: Every day | ORAL | 3 refills | Status: DC
Start: 2022-11-25 — End: 2023-04-06

## 2022-11-25 NOTE — Progress Notes (Signed)
MyChart Video Visit    Virtual Visit via Video Note   This patient is at least at moderate risk for complications without adequate follow up. This format is felt to be most appropriate for this patient at this time. Physical exam was limited by quality of the video and audio technology used for the visit. [name] was able to get the patient set up on a video visit.  Patient location: Home Patient and provider in visit Provider location: Office  I discussed the limitations of evaluation and management by telemedicine and the availability of in person appointments. The patient expressed understanding and agreed to proceed.  Visit Date: 11/25/2022  Today's healthcare provider: Donato Schultz, DO     Subjective:    Patient ID: Deborah Watts, female    DOB: 01/13/1966, 57 y.o.   MRN: [id number]  Chief Complaint  Patient presents with   Follow-up    Cough & congestion    HPI Patient is in today for c/o con't bronchitis Discussed the use of AI scribe software for clinical note transcription with the patient, who gave verbal consent to proceed.  History of Present Illness   The patient, with a history of penicillin allergy, presents with a persistent cough that has not improved despite a course of azithromycin and prednisone. The cough is productive, with the patient describing 'gunk' and is severe enough to cause dry heaving. She denies fever but reports occasional chills and fatigue. The patient also has a history of respiratory issues, requiring the use of daily inhalers and a rescue inhaler, which was recently used at work. She also reports exposure to a strong cleaner at work, which may have exacerbated her respiratory symptoms.  In addition to her respiratory issues, the patient is also on Xanax and Lyrica for unspecified conditions, and Lasix, with only one refill remaining.       Past Medical History:  Diagnosis Date   Anxiety    Arthritis    Asthma    Headache     migraines   History of hiatal hernia    repaired 30 yrs ago in [place]   Hypertension    MHA (microangiopathic hemolytic anemia) (HCC)     Past Surgical History:  Procedure Laterality Date   APPENDECTOMY N/A    LAPAROSCOPIC CHOLECYSTECTOMY N/A 03/1992   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   RE[facility]ACEMENT TOTAL KNEE Right [facility]15/2021   [facility]   TOTAL ABDOMINAL HYSTERECTOMY Right    TUBAL LIGATION     WRIST ARTHROSCOPY Right 02/12/2018   Procedure: Right wrist arthroscopy, evaluation under anesthesia with debridement and repair as necessary and right carpal tunnel release;  Surgeon: Dominica Severin, MD;  Location: [facility] OR;  Service: Orthopedics;  Laterality: Right;  90 mins    Family History  Problem Relation Age of Onset   Throat cancer Father    Kidney failure Father    Asthma Other    Diabetes Other    Hyperlipidemia Other     Social History   Socioeconomic History   Marital status: Married    Spouse name: Not on file   Number of children: Not on file   Years of education: Not on file   Highest education level: Some college, no degree  Occupational History   Occupation: Administrator, 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[place]  Tobacco Use   Smoking status: Never   Smokeless tobacco: Never  Vaping Use   Vaping status: Never Used  Substance and Sexual Activity   Alcohol use: Yes    Alcohol/week: 0.0 standard drinks of alcohol    Comment: occassional   Drug use: No   Sexual activity: Yes    Partners: Male  Other Topics Concern   Not on file  Social History Narrative   Exercise-- no   Social Determinants of Health   Financial Resource Strain: Low Risk  (06/10/2022)   Overall Financial Resource Strain (CARDIA)    Difficulty of Paying Living Expenses: Not hard at all  Recent Concern: Financial Resource Strain - Medium Risk (03/19/2022)   Received from [facility], [facility]   Overall Financial Resource Strain (CARDIA)    Difficulty of Paying Living  Expenses: Somewhat hard  Food Insecurity: No Food Insecurity (06/10/2022)   Hunger Vital Sign    Worried About Running Out of Food in the Last Year: Never true    Ran Out of Food in the Last Year: Never true  Transportation Needs: No Transportation Needs (06/10/2022)   PRAPARE - Administrator, Civil Service (Medical): No    Lack of Transportation (Non-Medical): No  Physical Activity: Insufficiently Active (06/10/2022)   Exercise Vital Sign    Days of Exercise per Week: 3 days    Minutes of Exercise per Session: 10 min  Stress: No Stress Concern Present (06/10/2022)   [employer] of Occupational Health - Occupational Stress Questionnaire    Feeling of Stress : Not at all  Social Connections: Socially Integrated (06/10/2022)   Social Connection and Isolation Panel [NHANES]    Frequency of Communication with Friends and Family: More than three times a week    Frequency of Social Gatherings with Friends and Family: Once a week    Attends Religious Services: More than 4 times per year    Active Member of [employer] or Organizations: Yes    Attends Engineer, structural: More than 4 times per year    Marital Status: Married  Catering manager Violence: Not At Risk (03/19/2022)   Received from [employer], [facility]   HITS    Over the last 12 months how often did your partner physically hurt you?: 1    Over the last 12 months how often did your partner insult you or talk down to you?: 1    Over the last 12 months how often did your partner threaten you with physical harm?: 1    Over the last 12 months how often did your partner scream or curse at you?: 1    Outpatient Medications Prior to Visit  Medication Sig Dispense Refill   albuterol (VENTOLIN HFA) 108 (90 Base) MCG/ACT inhaler Inhale 2 puffs into the lungs every 6 (six) hours as needed for wheezing or shortness of breath (Cough). 18 g 3   ALPRAZolam (XANAX) 0.5 MG tablet TAKE 1 TABLET BY MOUTH THREE TIMES DAILYAS  NEEDED 90 tablet 0   azithromycin (ZITHROMAX Z-PAK) 250 MG tablet As directed 6 each 0   diclofenac (VOLTAREN) 75 MG EC tablet TAKE 1 TABLET BY MOUTH TWICE DAILY 60 tablet 1   doxycycline (VIBRA-TABS) 100 MG tablet Take 1 tablet (100 mg total) by mouth 2 (two) times daily. 20 tablet 0   escitalopram (LEXAPRO) 20 MG tablet Take 1 tablet (20 mg total) by mouth daily. 90 tablet 3   fluconazole (DIFLUCAN) 150 MG tablet Take 1 tablet (150 mg total) by mouth daily. May repeat in 3 days as needed 2 tablet 2   fluticasone (FLONASE)  50 MCG/ACT nasal spray Place 2 sprays into both nostrils daily. 16 g 0   fluticasone-salmeterol (ADVAIR) 100-50 MCG/ACT AEPB INHALE 2 DOSES BY MOUTH TWICE DAILY 180 each 0   ipratropium (ATROVENT) 0.03 % nasal spray Place 2 sprays into both nostrils every 12 (twelve) hours. 30 mL 0   Ketorolac Tromethamine (TORADOL ORAL PO) Take 10 mg by mouth every 6 (six) hours as needed.     Meclizine HCl (TRAVEL SICKNESS) 25 MG CHEW CHEW 1 TABLET EVERY 6 HOURS AS NEEDED 30 tablet 1   methocarbamol (ROBAXIN-750) 750 MG tablet Take 1 tablet (750 mg total) by mouth every 6 (six) hours as needed for muscle spasms. 45 tablet 0   montelukast (SINGULAIR) 10 MG tablet Take 1 tablet (10 mg total) by mouth at bedtime. 90 tablet 3   NONFORMULARY OR COMPOUNDED ITEM Compression socks  20-30 mm hg #1 as directed 1 each 1   nystatin (MYCOSTATIN) 100000 UNIT/ML suspension Take 5 mLs (500,000 Units total) by mouth 4 (four) times daily. 60 mL 0   predniSONE (DELTASONE) 10 MG tablet TAKE 3 TABLETS PO QD FOR 3 DAYS THEN TAKE 2 TABLETS PO QD FOR 3 DAYS THEN TAKE 1 TABLET PO QD FOR 3 DAYS THEN TAKE 1/2 TAB PO QD FOR 3 DAYS 20 tablet 0   pregabalin (LYRICA) 225 MG capsule Take 1 capsule (225 mg total) by mouth 2 (two) times daily. 60 capsule 1   promethazine-dextromethorphan (PROMETHAZINE-DM) 6.25-15 MG/5ML syrup Take 5 mLs by mouth 4 (four) times daily as needed. 118 mL 0   promethazine-dextromethorphan  (PROMETHAZINE-DM) 6.25-15 MG/5ML syrup Take 5 mLs by mouth 4 (four) times daily as needed. 118 mL 0   SUMAtriptan (IMITREX) 100 MG tablet TAKE 1 TABLET BY MOUTH AS NEEDED FOR MIGRAINE. REPEAT IN 2 HOURS IF NEEDED 12 tablet 3   topiramate (TOPAMAX) 50 MG tablet TAKE ONE TABLET BY MOUTH TWICE DAILY 180 tablet 1   traMADol (ULTRAM) 50 MG tablet Take 1 tablet (50 mg total) by mouth every 8 (eight) hours as needed. 30 tablet 0   triamcinolone cream (KENALOG) 0.1 % Apply 1 Application topically 2 (two) times daily. 30 g 0   furosemide (LASIX) 40 MG tablet Take 1 tablet (40 mg total) by mouth daily. 30 tablet 3   No facility-administered medications prior to visit.    Allergies  Allergen Reactions   Fish Allergy Hives and Swelling   Penicillins Anaphylaxis, Hives and Other (See Comments)    Has patient had a PCN reaction causing immediate rash, facial/tongue/throat swelling, SOB or lightheadedness with hypotension: Unknown Has patient had a PCN reaction causing severe rash involving mucus membranes or skin necrosis: No Has patient had a PCN reaction that required hospitalization: Yes Has patient had a PCN reaction occurring within the last 10 years: No If all of the above answers are "NO", then may proceed with Cephalosporin use.    Tizanidine Hcl Nausea And Vomiting, Rash and Shortness Of Breath   Iohexol Other (See Comments)     Desc: hives, sob, pt. needs 13 hr prep   01/16/05    Ivp Dye [Iodinated Contrast Media] Hives and Swelling   Morphine And Codeine Nausea And Vomiting    Review of Systems  Constitutional:  Negative for chills, fever and malaise/fatigue.  HENT:  Negative for congestion and hearing loss.   Eyes:  Negative for blurred vision and discharge.  Respiratory:  Positive for cough, sputum production and wheezing. Negative for shortness of breath.  Cardiovascular:  Negative for chest pain, palpitations and leg swelling.  Gastrointestinal:  Negative for abdominal pain, blood  in stool, constipation, diarrhea, heartburn, nausea and vomiting.  Genitourinary:  Negative for dysuria, frequency, hematuria and urgency.  Musculoskeletal:  Negative for back pain, falls and myalgias.  Skin:  Negative for rash.  Neurological:  Negative for dizziness, sensory change, loss of consciousness, weakness and headaches.  Endo/Heme/Allergies:  Negative for environmental allergies. Does not bruise/bleed easily.  Psychiatric/Behavioral:  Negative for depression and suicidal ideas. The patient is not nervous/anxious and does not have insomnia.        Objective:    Physical Exam Vitals and nursing note reviewed.  Pulmonary:     Effort: Pulmonary effort is normal.     Breath sounds: Wheezing present.     There were no vitals taken for this visit. Wt Readings from Last 3 Encounters:  11/07/22 222 lb 3.2 oz (100.8 kg)  10/27/22 225 lb 4.8 oz (102.2 kg)  08/18/22 223 lb 12.8 oz (101.5 kg)       Assessment & Plan:  Mild intermittent asthma with acute exacerbation -     Erythromycin Base; Take 1 tablet (500 mg total) by mouth 2 (two) times daily.  Dispense: 20 tablet; Refill: 0 -     Promethazine-DM; Take 5 mLs by mouth 4 (four) times daily as needed.  Dispense: 118 mL; Refill: 0  Edema, lower extremity -     Furosemide; Take 1 tablet (40 mg total) by mouth daily.  Dispense: 30 tablet; Refill: 3   Assessment and Plan    Respiratory Infection Persistent cough and sputum production despite recent treatment with Z-Pak and prednisone. No fever reported. Possible exacerbation due to environmental triggers. -Prescribe Biaxin (clarithromycin) as a second-line antibiotic. -Prescribe cough medicine to manage symptoms.  Asthma Increased use of rescue inhaler, possibly due to environmental triggers. -Continue use of regular inhalers and rescue inhaler as needed.  Lasix Refill Patient reports only one refill left on Lasix. -Refill Lasix prescription.  General Health  Maintenance -Continue Xanax and Lyrica as previously prescribed. -Advise patient to contact office if symptoms worsen or new symptoms develop.        I discussed the assessment and treatment plan with the patient. The patient was provided an opportunity to ask questions and all were answered. The patient agreed with the plan and demonstrated an understanding of the instructions.   The patient was advised to call back or seek an in-person evaluation if the symptoms worsen or if the condition fails to improve as anticipated.  Donato Schultz, DO [facility] at [facility] [phone] (phone) [phone] (fax)  [facility]

## 2022-12-01 ENCOUNTER — Encounter: Payer: Self-pay | Admitting: Family Medicine

## 2022-12-01 ENCOUNTER — Ambulatory Visit: Payer: BC Managed Care – PPO | Admitting: Family Medicine

## 2022-12-01 VITALS — BP 119/67 | HR 74 | Temp 98.0°F | Ht 59.0 in | Wt 225.0 lb

## 2022-12-01 DIAGNOSIS — R8290 Unspecified abnormal findings in urine: Secondary | ICD-10-CM | POA: Diagnosis not present

## 2022-12-01 DIAGNOSIS — R059 Cough, unspecified: Secondary | ICD-10-CM

## 2022-12-01 DIAGNOSIS — R5383 Other fatigue: Secondary | ICD-10-CM

## 2022-12-01 DIAGNOSIS — R32 Unspecified urinary incontinence: Secondary | ICD-10-CM | POA: Diagnosis not present

## 2022-12-01 LAB — CBC WITH DIFFERENTIAL/PLATELET
Basophils Absolute: 0.1 10*3/uL (ref 0.0–0.1)
Basophils Relative: 0.8 % (ref 0.0–3.0)
Eosinophils Absolute: 0.4 10*3/uL (ref 0.0–0.7)
Eosinophils Relative: 4.4 % (ref 0.0–5.0)
HCT: 42.3 % (ref 36.0–46.0)
Hemoglobin: 13.6 g/dL (ref 12.0–15.0)
Lymphocytes Relative: 23 % (ref 12.0–46.0)
Lymphs Abs: 2.2 10*3/uL (ref 0.7–4.0)
MCHC: 32.1 g/dL (ref 30.0–36.0)
MCV: 87.4 fl (ref 78.0–100.0)
Monocytes Absolute: 0.6 10*3/uL (ref 0.1–1.0)
Monocytes Relative: 6.2 % (ref 3.0–12.0)
Neutro Abs: 6.2 10*3/uL (ref 1.4–7.7)
Neutrophils Relative %: 65.6 % (ref 43.0–77.0)
Platelets: 306 10*3/uL (ref 150.0–400.0)
RBC: 4.85 Mil/uL (ref 3.87–5.11)
RDW: 15.9 % — ABNORMAL HIGH (ref 11.5–15.5)
WBC: 9.5 10*3/uL (ref 4.0–10.5)

## 2022-12-01 LAB — IBC + FERRITIN
Ferritin: 41.4 ng/mL (ref 10.0–291.0)
Iron: 36 ug/dL — ABNORMAL LOW (ref 42–145)
Saturation Ratios: 12.4 % — ABNORMAL LOW (ref 20.0–50.0)
TIBC: 289.8 ug/dL (ref 250.0–450.0)
Transferrin: 207 mg/dL — ABNORMAL LOW (ref 212.0–360.0)

## 2022-12-01 LAB — POC URINALSYSI DIPSTICK (AUTOMATED)
Bilirubin, UA: NEGATIVE
Blood, UA: NEGATIVE
Glucose, UA: NEGATIVE
Ketones, UA: NEGATIVE
Leukocytes, UA: NEGATIVE
Nitrite, UA: NEGATIVE
Protein, UA: NEGATIVE
Spec Grav, UA: <=1.005 — AB (ref 1.010–1.025)
Urobilinogen, UA: 0.2 U/dL
pH, UA: 8 (ref 5.0–8.0)

## 2022-12-01 LAB — COMPREHENSIVE METABOLIC PANEL
ALT: 22 U/L (ref 0–35)
AST: 18 U/L (ref 0–37)
Albumin: 3.8 g/dL (ref 3.5–5.2)
Alkaline Phosphatase: 74 U/L (ref 39–117)
BUN: 10 mg/dL (ref 6–23)
CO2: 27 meq/L (ref 19–32)
Calcium: 8.8 mg/dL (ref 8.4–10.5)
Chloride: 108 meq/L (ref 96–112)
Creatinine, Ser: 0.75 mg/dL (ref 0.40–1.20)
GFR: 87.75 mL/min (ref >60.00)
Glucose, Bld: 76 mg/dL (ref 70–99)
Potassium: 4.4 meq/L (ref 3.5–5.1)
Sodium: 143 meq/L (ref 135–145)
Total Bilirubin: 0.3 mg/dL (ref 0.2–1.2)
Total Protein: 6.9 g/dL (ref 6.0–8.3)

## 2022-12-01 LAB — TSH: TSH: 1.43 u[IU]/mL (ref 0.35–5.50)

## 2022-12-01 LAB — B12 AND FOLATE PANEL
Folate: 9.7 ng/mL (ref >5.9)
Vitamin B-12: 383 pg/mL (ref 211–911)

## 2022-12-01 MED ORDER — BENZONATATE 200 MG PO CAPS
200.0000 mg | ORAL_CAPSULE | Freq: Two times a day (BID) | ORAL | 0 refills | Status: DC | PRN
Start: 2022-12-01 — End: 2023-05-08

## 2022-12-01 MED ORDER — DOXYCYCLINE HYCLATE 100 MG PO TABS
100.0000 mg | ORAL_TABLET | Freq: Two times a day (BID) | ORAL | 0 refills | Status: AC
Start: 2022-12-01 — End: 2022-12-11

## 2022-12-01 NOTE — Progress Notes (Signed)
Acute Office Visit  Subjective:     Patient ID: Deborah Watts, female    DOB: Sep 07, 1965, 57 y.o.   MRN: [id number]  Chief Complaint  Patient presents with   Cough   Urinary Frequency     Patient is in today for cough.  Discussed the use of AI scribe software for clinical note transcription with the patient, who gave verbal consent to proceed.  History of Present Illness   The patient, with a history of asthma and recent strep throat, presents with persistent symptoms of fatigue, cough, and mucus production. They report a sensation of soreness in the chest and back. Despite using inhalers, the cough has persisted and has become dry, often leading to gagging. The patient also reports a recent episode of feeling unwell at work, with shaky legs and altered vision.  The patient has been on a course of azithromycin and prednisone for strep throat, followed by erythromycin for bronchitis. However, they report no significant improvement in symptoms. The patient also mentions taking cough syrup.  The patient has also undergone a COVID test at home, which returned negative. They have been taking Zyrtec for allergies.           All review of systems negative except what is listed in the HPI      Objective:    BP 119/67   Pulse 74   Temp 98 F (36.7 C) (Oral)   Ht 4\' 11"  (1.499 m)   Wt 225 lb (102.1 kg)   SpO2 99%   BMI 45.44 kg/m    Physical Exam Vitals reviewed.  Constitutional:      General: She is not in acute distress.    Appearance: Normal appearance. She is obese.  HENT:     Head: Normocephalic and atraumatic.  Cardiovascular:     Rate and Rhythm: Normal rate and regular rhythm.     Heart sounds: Normal heart sounds.  Pulmonary:     Effort: Pulmonary effort is normal.     Breath sounds: Rhonchi present.     Comments: Productive cough Musculoskeletal:     Cervical back: Normal range of motion and neck supple.  Skin:    General: Skin is warm and dry.   Neurological:     Mental Status: She is alert and oriented to person, place, and time.  Psychiatric:        Mood and Affect: Mood normal.        Behavior: Behavior normal.        Thought Content: Thought content normal.        Judgment: Judgment normal.          Assessment & Plan:   Problem List Items Addressed This Visit       Active Problems   Other fatigue   Relevant Orders   CBC with Differential/Platelet (Completed)   Comprehensive metabolic panel (Completed)   B12 and Folate Panel (Completed)   IBC + Ferritin (Completed)   TSH (Completed)   Other Visit Diagnoses     Abnormal urine odor    -  Primary   Relevant Orders   POCT Urinalysis Dipstick (Automated) (Completed)   Urine Culture   Urinary incontinence, unspecified type       Relevant Orders   POCT Urinalysis Dipstick (Automated) (Completed)   Urine Culture   Cough, unspecified type       Relevant Medications   benzonatate (TESSALON) 200 MG capsule   doxycycline (VIBRA-TABS) 100 MG tablet   Other  Relevant Orders   CBC with Differential/Platelet (Completed)   Comprehensive metabolic panel (Completed)      Chest x-ray previously normal. -Discontinue erythromycin. -Start doxycycline. -Continue use of inhalers. -Add Tessalon Perles for cough. -Consider repeat chest x-ray if no improvement  -UA unremarkable, sending for culture.        Meds ordered this encounter  Medications   benzonatate (TESSALON) 200 MG capsule    Sig: Take 1 capsule (200 mg total) by mouth 2 (two) times daily as needed for cough.    Dispense:  20 capsule    Refill:  0    Order Specific Question:   Supervising Provider    Answer:   Danise Edge A [[id number]]   doxycycline (VIBRA-TABS) 100 MG tablet    Sig: Take 1 tablet (100 mg total) by mouth 2 (two) times daily for 10 days.    Dispense:  20 tablet    Refill:  0    Order Specific Question:   Supervising Provider    Answer:   Danise Edge A [[id number]]    Return if  symptoms worsen or fail to improve.  Clayborne Dana, NP

## 2022-12-02 LAB — URINE CULTURE
MICRO NUMBER:: 15471003
Result:: NO GROWTH
SPECIMEN QUALITY:: ADEQUATE

## 2022-12-09 ENCOUNTER — Other Ambulatory Visit: Payer: Self-pay | Admitting: Family Medicine

## 2022-12-15 ENCOUNTER — Encounter: Payer: Self-pay | Admitting: Family Medicine

## 2022-12-15 DIAGNOSIS — M170 Bilateral primary osteoarthritis of knee: Secondary | ICD-10-CM

## 2022-12-15 MED ORDER — DICLOFENAC SODIUM 75 MG PO TBEC
75.0000 mg | DELAYED_RELEASE_TABLET | Freq: Two times a day (BID) | ORAL | 1 refills | Status: DC
Start: 2022-12-15 — End: 2023-02-02

## 2022-12-20 ENCOUNTER — Other Ambulatory Visit: Payer: Self-pay | Admitting: Family Medicine

## 2022-12-20 DIAGNOSIS — F411 Generalized anxiety disorder: Secondary | ICD-10-CM

## 2022-12-22 NOTE — Telephone Encounter (Signed)
Requesting: alprazolam 0.5mg   Contract: 05/02/21 UDS: 05/02/21 Last Visit: 11/25/22 Next Visit: 05/08/23 Last Refill: 11/24/22 #90 and 0RF    Please Advise

## 2022-12-29 ENCOUNTER — Encounter: Payer: Self-pay | Admitting: Family Medicine

## 2022-12-29 MED ORDER — FLUTICASONE-SALMETEROL 100-50 MCG/ACT IN AEPB: 2.0000 | INHALATION_SPRAY | Freq: Two times a day (BID) | RESPIRATORY_TRACT | 1 refills | Status: DC

## 2022-12-29 MED ORDER — ALBUTEROL SULFATE HFA 108 (90 BASE) MCG/ACT IN AERS: 2.0000 | INHALATION_SPRAY | Freq: Four times a day (QID) | RESPIRATORY_TRACT | 3 refills | Status: DC | PRN

## 2022-12-30 ENCOUNTER — Ambulatory Visit: Payer: BC Managed Care – PPO | Admitting: Family Medicine

## 2022-12-30 VITALS — BP 136/56 | HR 95 | Temp 98.2°F | Resp 20 | Ht 59.0 in | Wt 224.0 lb

## 2022-12-30 DIAGNOSIS — R0602 Shortness of breath: Secondary | ICD-10-CM

## 2022-12-30 DIAGNOSIS — J4521 Mild intermittent asthma with (acute) exacerbation: Secondary | ICD-10-CM

## 2022-12-30 DIAGNOSIS — R109 Unspecified abdominal pain: Secondary | ICD-10-CM

## 2022-12-30 MED ORDER — PREDNISONE 20 MG PO TABS: 40.0000 mg | ORAL_TABLET | Freq: Every day | ORAL | 0 refills | Status: DC

## 2022-12-30 MED ORDER — METHYLPREDNISOLONE ACETATE 80 MG/ML IJ SUSP
80.0000 mg | Freq: Once | INTRAMUSCULAR | Status: AC
Start: 2022-12-30 — End: 2022-12-30
  Administered 2022-12-30: 80 mg via INTRAMUSCULAR

## 2022-12-30 MED ORDER — ALBUTEROL SULFATE (2.5 MG/3ML) 0.083% IN NEBU
2.5000 mg | INHALATION_SOLUTION | Freq: Four times a day (QID) | RESPIRATORY_TRACT | 1 refills | Status: AC | PRN
Start: 2022-12-30 — End: ?

## 2022-12-30 NOTE — Telephone Encounter (Signed)
Yes, that's fine. Thank you!

## 2022-12-30 NOTE — Telephone Encounter (Signed)
Okay to complete paperwork for these days?

## 2022-12-30 NOTE — Telephone Encounter (Signed)
Forms completed, copy sent to scan, copy to hold.

## 2022-12-30 NOTE — Progress Notes (Unsigned)
Established Patient Office Visit  Subjective   Patient ID: Deborah Watts, female    DOB: 10-03-1965  Age: 57 y.o. MRN: [id number]  No chief complaint on file.   HPI Discussed the use of AI scribe software for clinical note transcription with the patient, who gave verbal consent to proceed.  History of Present Illness   The patient, with a history of asthma, presents with acute onset of breathing problems and wheezing. The symptoms started at work and worsened throughout the day, prompting her to leave work early. She denies exposure to any known triggers at work. Despite using her inhaler, she did not experience any relief. She also reports feeling lightheaded and has a persistent dry sensation in her head. She has been taking Zyrtec daily for allergies and uses Advair twice daily and albuterol as needed for her asthma.  In addition to her respiratory symptoms, she reports a new onset of pain in the right flank area, which started while at work. She also mentions an episode of urinary incontinence at work. She denies any fever or body aches.      Patient Active Problem List   Diagnosis Date Noted   Pain of left calf 08/12/2022   Cellulitis of left lower extremity 08/12/2022   Edema, lower extremity 07/21/2022   SOB (shortness of breath) 07/21/2022   Hyperlipidemia 07/21/2022   Major depressive disorder, recurrent episode, moderate (HCC) 11/25/2021   Generalized anxiety disorder 11/25/2021   Depression with anxiety 10/28/2021   Asthma, chronic, unspecified asthma severity, uncomplicated 05/02/2021   Loud snoring 11/28/2019   Daytime sleepiness 11/28/2019   Other fatigue 11/28/2019   Primary osteoarthritis of left knee 08/02/2019   Rib pain 10/19/2018   Right knee pain 04/12/2017   Preventative health care 03/25/2016   Left ankle pain 01/14/2016   Sinusitis, acute 12/22/2014   Left leg pain 04/28/2014   Pain of right calf 04/20/2014   Acute bacterial sinusitis 04/14/2014    Obesity (BMI 30-39.9) 01/06/2013   Influenza 03/10/2011   Thrush, oral 03/04/2011   TINEA CRURIS 02/05/2010   HYPOKALEMIA 04/26/2009   Primary hypertension 02/08/2008   LOW BACK PAIN SYNDROME 05/26/2007   LEG CRAMPS 05/14/2007   Edema 05/14/2007   Chronic migraine without aura, with status migrainosus 05/01/2007   ASTHMATIC BRONCHITIS, ACUTE 10/15/2006   Anxiety state 04/20/2006   Asthma with acute exacerbation 04/20/2006   RESTLESS LEG SYNDROME, HX OF 04/20/2006   Past Medical History:  Diagnosis Date   Anxiety    Arthritis    Asthma    Headache    migraines   History of hiatal hernia    repaired 30 yrs ago in [place]   Hypertension    MHA (microangiopathic hemolytic anemia) (HCC)    Past Surgical History:  Procedure Laterality Date   APPENDECTOMY N/A    LAPAROSCOPIC CHOLECYSTECTOMY N/A 03/1992   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   RE[facility]ACEMENT TOTAL KNEE Right 01/15/2021   [facility]   TOTAL ABDOMINAL HYSTERECTOMY Right    TUBAL LIGATION     WRIST ARTHROSCOPY Right 02/12/2018   Procedure: Right wrist arthroscopy, evaluation under anesthesia with debridement and repair as necessary and right carpal tunnel release;  Surgeon: Dominica Severin, MD;  Location: [facility] OR;  Service: Orthopedics;  Laterality: Right;  90 mins   Social History   Tobacco Use   Smoking status: Never   Smokeless tobacco: Never  Vaping Use   Vaping status: Never Used  Substance Use Topics  Alcohol use: Yes    Alcohol/week: 0.0 standard drinks of alcohol    Comment: occassional   Drug use: No   Social History   Socioeconomic History   Marital status: Married    Spouse name: Not on file   Number of children: Not on file   Years of education: Not on file   Highest education level: Some college, no degree  Occupational History   Occupation: Administrator, 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[employer] [place]  Tobacco Use   Smoking status: Never   Smokeless tobacco: Never  Vaping Use   Vaping  status: Never Used  Substance and Sexual Activity   Alcohol use: Yes    Alcohol/week: 0.0 standard drinks of alcohol    Comment: occassional   Drug use: No   Sexual activity: Yes    Partners: Male  Other Topics Concern   Not on file  Social History Narrative   Exercise-- no   Social Determinants of Health   Financial Resource Strain: Low Risk  (06/10/2022)   Overall Financial Resource Strain (CARDIA)    Difficulty of Paying Living Expenses: Not hard at all  Recent Concern: Financial Resource Strain - Medium Risk (03/19/2022)   Received from [facility], [facility]   Overall Financial Resource Strain (CARDIA)    Difficulty of Paying Living Expenses: Somewhat hard  Food Insecurity: No Food Insecurity (06/10/2022)   Hunger Vital Sign    Worried About Running Out of Food in the Last Year: Never true    Ran Out of Food in the Last Year: Never true  Transportation Needs: No Transportation Needs (06/10/2022)   PRAPARE - Administrator, Civil Service (Medical): No    Lack of Transportation (Non-Medical): No  Physical Activity: Insufficiently Active (06/10/2022)   Exercise Vital Sign    Days of Exercise per Week: 3 days    Minutes of Exercise per Session: 10 min  Stress: No Stress Concern Present (06/10/2022)   [employer] of Occupational Health - Occupational Stress Questionnaire    Feeling of Stress : Not at all  Social Connections: Socially Integrated (06/10/2022)   Social Connection and Isolation Panel [NHANES]    Frequency of Communication with Friends and Family: More than three times a week    Frequency of Social Gatherings with Friends and Family: Once a week    Attends Religious Services: More than 4 times per year    Active Member of [employer] or Organizations: Yes    Attends Engineer, structural: More than 4 times per year    Marital Status: Married  Catering manager Violence: Not At Risk (03/19/2022)   Received from [employer], [facility]    HITS    Over the last 12 months how often did your partner physically hurt you?: 1    Over the last 12 months how often did your partner insult you or talk down to you?: 1    Over the last 12 months how often did your partner threaten you with physical harm?: 1    Over the last 12 months how often did your partner scream or curse at you?: 1   Family Status  Relation Name Status   Mother  Alive   Father  Deceased at age 18   Other  (Not Specified)  No partnership data on file   Family History  Problem Relation Age of Onset   Throat cancer Father    Kidney failure Father    Asthma Other  Diabetes Other    Hyperlipidemia Other    Allergies  Allergen Reactions   Fish Allergy Hives and Swelling   Penicillins Anaphylaxis, Hives and Other (See Comments)    Has patient had a PCN reaction causing immediate rash, facial/tongue/throat swelling, SOB or lightheadedness with hypotension: Unknown Has patient had a PCN reaction causing severe rash involving mucus membranes or skin necrosis: No Has patient had a PCN reaction that required hospitalization: Yes Has patient had a PCN reaction occurring within the last 10 years: No If all of the above answers are "NO", then may proceed with Cephalosporin use.    Tizanidine Hcl Nausea And Vomiting, Rash and Shortness Of Breath   Iohexol Other (See Comments)     Desc: hives, sob, pt. needs 13 hr prep   01/16/05    Ivp Dye [Iodinated Contrast Media] Hives and Swelling   Morphine And Codeine Nausea And Vomiting      ROS    Objective:     BP (!) 136/56   Pulse 95   Temp 98.2 F (36.8 C)   Resp 20   Ht 4\' 11"  (1.499 m)   Wt 224 lb (101.6 kg)   SpO2 93%   BMI 45.24 kg/m  BP Readings from Last 3 Encounters:  12/30/22 (!) 136/56  12/01/22 119/67  11/07/22 (!) 140/90   Wt Readings from Last 3 Encounters:  12/30/22 224 lb (101.6 kg)  12/01/22 225 lb (102.1 kg)  11/07/22 222 lb 3.2 oz (100.8 kg)   SpO2 Readings from Last 3  Encounters:  12/30/22 93%  12/01/22 99%  11/07/22 96%      Physical Exam Vitals and nursing note reviewed.  Constitutional:      General: She is not in acute distress.    Appearance: Normal appearance. She is well-developed.  HENT:     Head: Normocephalic and atraumatic.     Right Ear: Tympanic membrane, ear canal and external ear normal. There is no impacted cerumen.     Left Ear: Tympanic membrane, ear canal and external ear normal. There is no impacted cerumen.     Nose: Nose normal.     Mouth/Throat:     Mouth: Mucous membranes are moist.     Pharynx: Oropharynx is clear. No oropharyngeal exudate or posterior oropharyngeal erythema.  Eyes:     General: No scleral icterus.       Right eye: No discharge.        Left eye: No discharge.     Conjunctiva/sclera: Conjunctivae normal.     Pupils: Pupils are equal, round, and reactive to light.  Neck:     Thyroid: No thyromegaly or thyroid tenderness.     Vascular: No JVD.  Cardiovascular:     Rate and Rhythm: Regular rhythm. Tachycardia present.     Heart sounds: Normal heart sounds. No murmur heard. Pulmonary:     Effort: Pulmonary effort is normal. No respiratory distress.     Breath sounds: Decreased air movement present. Decreased breath sounds and wheezing present.  Abdominal:     General: Bowel sounds are normal. There is no distension.     Palpations: Abdomen is soft. There is no mass.     Tenderness: There is no abdominal tenderness. There is no guarding or rebound.  Genitourinary:    Vagina: Normal.  Musculoskeletal:        General: Normal range of motion.     Cervical back: Normal range of motion and neck supple.     Right  lower leg: No edema.     Left lower leg: No edema.  Lymphadenopathy:     Cervical: No cervical adenopathy.  Skin:    General: Skin is warm and dry.     Findings: No erythema or rash.  Neurological:     Mental Status: She is alert and oriented to person, place, and time.     Cranial Nerves:  No cranial nerve deficit.     Deep Tendon Reflexes: Reflexes are normal and symmetric.  Psychiatric:        Mood and Affect: Mood normal.        Behavior: Behavior normal.        Thought Content: Thought content normal.        Judgment: Judgment normal.      No results found for any visits on 12/30/22.  Last CBC Lab Results  Component Value Date   WBC 11.0 (H) 12/31/2022   HGB 14.2 12/31/2022   HCT 45.2 12/31/2022   MCV 88.2 12/31/2022   MCH 28.3 08/29/2021   RDW 16.3 (H) 12/31/2022   PLT 273.0 12/31/2022   Last metabolic panel Lab Results  Component Value Date   GLUCOSE 76 12/01/2022   NA 143 12/01/2022   K 4.4 12/01/2022   CL 108 12/01/2022   CO2 27 12/01/2022   BUN 10 12/01/2022   CREATININE 0.75 12/01/2022   GFR 87.75 12/01/2022   CALCIUM 8.8 12/01/2022   PROT 6.9 12/01/2022   ALBUMIN 3.8 12/01/2022   BILITOT 0.3 12/01/2022   ALKPHOS 74 12/01/2022   AST 18 12/01/2022   ALT 22 12/01/2022   ANIONGAP 7 08/29/2021   Last lipids Lab Results  Component Value Date   CHOL 151 07/21/2022   HDL 29.80 (L) 07/21/2022   LDLCALC 101 (H) 10/28/2021   LDLDIRECT 100.0 07/21/2022   TRIG 297.0 (H) 07/21/2022   CHOLHDL 5 07/21/2022   Last hemoglobin A1c Lab Results  Component Value Date   HGBA1C 6.6 (H) 07/28/2022   Last thyroid functions Lab Results  Component Value Date   TSH 1.43 12/01/2022   Last vitamin D No results found for: "25OHVITD2", "25OHVITD3", "VD25OH" Last vitamin B12 and Folate Lab Results  Component Value Date   VITAMINB12 383 12/01/2022   FOLATE 9.7 12/01/2022      The 10-year ASCVD risk score (Arnett DK, et al., 2019) is: 5.1%    Assessment & Plan:   Problem List Items Addressed This Visit       Unprioritized   Asthma with acute exacerbation   Relevant Medications   albuterol (PROVENTIL) (2.5 MG/3ML) 0.083% nebulizer solution   predniSONE (DELTASONE) 20 MG tablet   Other Relevant Orders   CBC with Differential/Platelet   CBC  with Differential/Platelet (Completed)   SOB (shortness of breath) - Primary   Relevant Medications   albuterol (PROVENTIL) (2.5 MG/3ML) 0.083% nebulizer solution   Other Relevant Orders   CBC with Differential/Platelet   DG Chest 2 View (Completed)   D-Dimer, Quantitative (Completed)   D-Dimer, Quantitative   CBC with Differential/Platelet (Completed)   Other Visit Diagnoses     Flank pain       Relevant Orders   POCT Urinalysis Dipstick (Automated) (Completed)     Assessment and Plan    Asthma exacerbation Acute onset of wheezing and shortness of breath. No known triggers. Oxygen saturation at 93%. No wheezing on examination, possibly due to recent use of albuterol inhaler. -Order chest x-ray for tomorrow to rule out other causes of dyspnea. -  Check D-dimer to rule out pulmonary embolism. -Advise patient to use nebulizer at home if available. -If symptoms worsen or oxygen saturation drops below 95%, patient should go to the emergency room.  Abdominal pain New onset, location near the kidney. No other associated symptoms reported. -Physical examination and further evaluation needed.  Urinary incontinence Two episodes today. Unclear etiology. -Requires further evaluation.        Return if symptoms worsen or fail to improve.    Donato Schultz, DO

## 2022-12-31 ENCOUNTER — Encounter: Payer: Self-pay | Admitting: Family Medicine

## 2022-12-31 ENCOUNTER — Telehealth (HOSPITAL_BASED_OUTPATIENT_CLINIC_OR_DEPARTMENT_OTHER): Payer: Self-pay

## 2022-12-31 ENCOUNTER — Other Ambulatory Visit: Payer: Self-pay

## 2022-12-31 ENCOUNTER — Ambulatory Visit (HOSPITAL_BASED_OUTPATIENT_CLINIC_OR_DEPARTMENT_OTHER): Admission: RE | Admit: 2022-12-31 | Payer: BC Managed Care – PPO | Source: Ambulatory Visit

## 2022-12-31 ENCOUNTER — Other Ambulatory Visit: Payer: Self-pay | Admitting: Family Medicine

## 2022-12-31 ENCOUNTER — Ambulatory Visit (HOSPITAL_BASED_OUTPATIENT_CLINIC_OR_DEPARTMENT_OTHER)
Admission: RE | Admit: 2022-12-31 | Discharge: 2022-12-31 | Disposition: A | Discharge status: Home / Self Care | Payer: BC Managed Care – PPO | Source: Ambulatory Visit | Attending: Family Medicine | Admitting: Family Medicine

## 2022-12-31 ENCOUNTER — Other Ambulatory Visit (INDEPENDENT_AMBULATORY_CARE_PROVIDER_SITE_OTHER): Payer: BC Managed Care – PPO

## 2022-12-31 DIAGNOSIS — R109 Unspecified abdominal pain: Secondary | ICD-10-CM | POA: Diagnosis not present

## 2022-12-31 DIAGNOSIS — J4521 Mild intermittent asthma with (acute) exacerbation: Secondary | ICD-10-CM | POA: Diagnosis not present

## 2022-12-31 DIAGNOSIS — R0602 Shortness of breath: Secondary | ICD-10-CM

## 2022-12-31 DIAGNOSIS — J45909 Unspecified asthma, uncomplicated: Secondary | ICD-10-CM | POA: Diagnosis not present

## 2022-12-31 DIAGNOSIS — R7989 Other specified abnormal findings of blood chemistry: Secondary | ICD-10-CM

## 2022-12-31 LAB — CBC WITH DIFFERENTIAL/PLATELET
Basophils Absolute: 0.1 10*3/uL (ref 0.0–0.1)
Basophils Relative: 0.6 % (ref 0.0–3.0)
Eosinophils Absolute: 0.4 10*3/uL (ref 0.0–0.7)
Eosinophils Relative: 3.5 % (ref 0.0–5.0)
HCT: 45.2 % (ref 36.0–46.0)
Hemoglobin: 14.2 g/dL (ref 12.0–15.0)
Lymphocytes Relative: 20.5 % (ref 12.0–46.0)
Lymphs Abs: 2.3 10*3/uL (ref 0.7–4.0)
MCHC: 31.4 g/dL (ref 30.0–36.0)
MCV: 88.2 fL (ref 78.0–100.0)
Monocytes Absolute: 0.5 10*3/uL (ref 0.1–1.0)
Monocytes Relative: 4.5 % (ref 3.0–12.0)
Neutro Abs: 7.8 10*3/uL — ABNORMAL HIGH (ref 1.4–7.7)
Neutrophils Relative %: 70.9 % (ref 43.0–77.0)
Platelets: 273 10*3/uL (ref 150.0–400.0)
RBC: 5.12 Mil/uL — ABNORMAL HIGH (ref 3.87–5.11)
RDW: 16.3 % — ABNORMAL HIGH (ref 11.5–15.5)
WBC: 11 10*3/uL — ABNORMAL HIGH (ref 4.0–10.5)

## 2022-12-31 LAB — POC URINALSYSI DIPSTICK (AUTOMATED)
Bilirubin, UA: NEGATIVE
Blood, UA: NEGATIVE
Glucose, UA: NEGATIVE
Ketones, UA: NEGATIVE
Leukocytes, UA: NEGATIVE
Nitrite, UA: NEGATIVE
Protein, UA: NEGATIVE
Spec Grav, UA: <=1.005 — AB (ref 1.010–1.025)
Urobilinogen, UA: NEGATIVE U/dL — AB
pH, UA: 7 (ref 5.0–8.0)

## 2022-12-31 LAB — D-DIMER, QUANTITATIVE: D-Dimer, Quant: 1.12 ug{FEU}/mL — ABNORMAL HIGH (ref <0.50)

## 2022-12-31 MED ORDER — PREDNISONE 50 MG PO TABS: ORAL_TABLET | ORAL | 0 refills | Status: DC

## 2022-12-31 NOTE — Patient Instructions (Signed)
Shortness of Breath, Adult Shortness of breath is when a person has trouble breathing or when a person feels like she or he is having trouble breathing in enough air. Shortness of breath could be a sign of a medical problem. Follow these instructions at home:  Pollutants Do not use any products that contain nicotine or tobacco. These products include cigarettes, chewing tobacco, and vaping devices, such as e-cigarettes. This also includes cigars and pipes. If you need help quitting, ask your health care provider. Avoid things that can irritate your airways, including: Smoke. This includes campfire smoke, forest fire smoke, and secondhand smoke from tobacco products. Do not smoke or allow others to smoke in your home. Mold. Dust. Air pollution. Chemical fumes. Things that can give you an allergic reaction (allergens) if you have allergies. Common allergens include pollen from grasses or trees and animal dander. Keep your living space clean and free of mold and dust. General instructions Pay attention to any changes in your symptoms. Take over-the-counter and prescription medicines only as told by your health care provider. This includes oxygen therapy and inhaled medicines. Rest as needed. Return to your normal activities as told by your health care provider. Ask your health care provider what activities are safe for you. Keep all follow-up visits. This is important. Contact a health care provider if: Your condition does not improve as soon as expected. You have a hard time doing your normal activities, even after you rest. You have new symptoms. You cannot walk up stairs or exercise the way that you normally do. Get help right away if: Your shortness of breath gets worse. You have shortness of breath when you are resting. You feel light-headed or you faint. You have a cough that is not controlled with medicines. You cough up blood. You have pain with breathing. You have pain in your  chest, arms, shoulders, or abdomen. You have a fever. These symptoms may be an emergency. Get help right away. Call 911. Do not wait to see if the symptoms will go away. Do not drive yourself to the hospital. Summary Shortness of breath is when a person has trouble breathing enough air. It can be a sign of a medical problem. Avoid things that irritate your lungs, such as smoking, pollution, mold, and dust. Pay attention to changes in your symptoms and contact your health care provider if you have a hard time completing daily activities because of shortness of breath. This information is not intended to replace advice given to you by your health care provider. Make sure you discuss any questions you have with your health care provider. Document Revised: 10/20/2020 Document Reviewed: 10/20/2020 Elsevier Patient Education  2024 [employer].

## 2022-12-31 NOTE — Addendum Note (Signed)
Addended by: Mervin Kung A on: 12/31/2022 10:40 AM   Modules accepted: Orders

## 2023-01-01 ENCOUNTER — Ambulatory Visit (HOSPITAL_BASED_OUTPATIENT_CLINIC_OR_DEPARTMENT_OTHER)
Admission: RE | Admit: 2023-01-01 | Discharge: 2023-01-01 | Disposition: A | Discharge status: Home / Self Care | Payer: BC Managed Care – PPO | Source: Ambulatory Visit | Attending: Family Medicine | Admitting: Family Medicine

## 2023-01-01 DIAGNOSIS — R7989 Other specified abnormal findings of blood chemistry: Secondary | ICD-10-CM | POA: Insufficient documentation

## 2023-01-01 DIAGNOSIS — K760 Fatty (change of) liver, not elsewhere classified: Secondary | ICD-10-CM | POA: Diagnosis not present

## 2023-01-01 DIAGNOSIS — E041 Nontoxic single thyroid nodule: Secondary | ICD-10-CM | POA: Diagnosis not present

## 2023-01-01 DIAGNOSIS — R0602 Shortness of breath: Secondary | ICD-10-CM | POA: Diagnosis not present

## 2023-01-01 DIAGNOSIS — R918 Other nonspecific abnormal finding of lung field: Secondary | ICD-10-CM | POA: Diagnosis not present

## 2023-01-01 MED ORDER — IOHEXOL 350 MG/ML SOLN
75.0000 mL | Freq: Once | INTRAVENOUS | Status: AC | PRN
  Administered 2023-01-01: 75 mL via INTRAVENOUS

## 2023-01-01 NOTE — Telephone Encounter (Signed)
Forms received and placed in folder.

## 2023-01-05 ENCOUNTER — Ambulatory Visit: Payer: BC Managed Care – PPO | Admitting: Family Medicine

## 2023-01-05 ENCOUNTER — Encounter: Payer: Self-pay | Admitting: Family Medicine

## 2023-01-05 VITALS — BP 140/90 | HR 73 | Temp 98.5°F | Resp 18 | Ht 59.0 in | Wt 221.4 lb

## 2023-01-05 DIAGNOSIS — J4521 Mild intermittent asthma with (acute) exacerbation: Secondary | ICD-10-CM | POA: Diagnosis not present

## 2023-01-05 DIAGNOSIS — R0602 Shortness of breath: Secondary | ICD-10-CM | POA: Diagnosis not present

## 2023-01-05 DIAGNOSIS — R918 Other nonspecific abnormal finding of lung field: Secondary | ICD-10-CM | POA: Diagnosis not present

## 2023-01-05 MED ORDER — FLUTICASONE-SALMETEROL 250-50 MCG/ACT IN AEPB: 1.0000 | INHALATION_SPRAY | Freq: Two times a day (BID) | RESPIRATORY_TRACT | 11 refills | Status: DC

## 2023-01-05 NOTE — Progress Notes (Signed)
Established Patient Office Visit  Subjective   Patient ID: Deborah Watts, female    DOB: July 01, 1965  Age: 57 y.o. MRN: [id number]  Chief Complaint  Patient presents with   Shortness of Breath    Pt states SOB is about the same.   Follow-up    HPI Discussed the use of AI scribe software for clinical note transcription with the patient, who gave verbal consent to proceed.  History of Present Illness   The patient, with a history of asthma, presents with ongoing shortness of breath and chest pain. She reports that these symptoms are exacerbated by physical activity, such as doing laundry, and result in a drop in her oxygen levels. She also experiences chest pain, which she describes as a 'hurting' sensation in her chest.  Recently, nodules were found in her lungs, which were not present in previous scans. This has caused the patient some concern, as she has a family history of similar nodules.  The patient is also dealing with work-related stress, as she has been unable to work due to her symptoms and is worried about losing her job. She has been out of work since the 16th and has been undergoing various tests since then.  The patient also mentions plans to travel to [place] next month, where she believes the dry [place] air will help with her breathing.      Patient Active Problem List   Diagnosis Date Noted   Pain of left calf 08/12/2022   Cellulitis of left lower extremity 08/12/2022   Edema, lower extremity 07/21/2022   SOB (shortness of breath) 07/21/2022   Hyperlipidemia 07/21/2022   Major depressive disorder, recurrent episode, moderate (HCC) 11/25/2021   Generalized anxiety disorder 11/25/2021   Depression with anxiety 10/28/2021   Asthma, chronic, unspecified asthma severity, uncomplicated 05/02/2021   Loud snoring 11/28/2019   Daytime sleepiness 11/28/2019   Other fatigue 11/28/2019   Primary osteoarthritis of left knee 08/02/2019   Rib pain 10/19/2018   Right knee pain  04/12/2017   Preventative health care 03/25/2016   Left ankle pain 01/14/2016   Sinusitis, acute 12/22/2014   Left leg pain 04/28/2014   Pain of right calf 04/20/2014   Acute bacterial sinusitis 04/14/2014   Obesity (BMI 30-39.9) 01/06/2013   Influenza 03/10/2011   Thrush, oral 03/04/2011   TINEA CRURIS 02/05/2010   HYPOKALEMIA 04/26/2009   Primary hypertension 02/08/2008   LOW BACK PAIN SYNDROME 05/26/2007   LEG CRAMPS 05/14/2007   Edema 05/14/2007   Chronic migraine without aura, with status migrainosus 05/01/2007   ASTHMATIC BRONCHITIS, ACUTE 10/15/2006   Anxiety state 04/20/2006   Asthma with acute exacerbation 04/20/2006   RESTLESS LEG SYNDROME, HX OF 04/20/2006   Past Medical History:  Diagnosis Date   Anxiety    Arthritis    Asthma    Headache    migraines   History of hiatal hernia    repaired 30 yrs ago in [place]   Hypertension    MHA (microangiopathic hemolytic anemia) (HCC)    Past Surgical History:  Procedure Laterality Date   APPENDECTOMY N/A    LAPAROSCOPIC CHOLECYSTECTOMY N/A 03/1992   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   [facility] Right [facility]15/2021   [facility]   TOTAL ABDOMINAL HYSTERECTOMY Right    TUBAL LIGATION     WRIST ARTHROSCOPY Right 02/12/2018   Procedure: Right wrist arthroscopy, evaluation under anesthesia with debridement and repair as necessary and right carpal tunnel release;  Surgeon:  Dominica Severin, MD;  Location: [facility] OR;  Service: Orthopedics;  Laterality: Right;  90 mins   Social History   Tobacco Use   Smoking status: Never   Smokeless tobacco: Never  Vaping Use   Vaping status: Never Used  Substance Use Topics   Alcohol use: Yes    Alcohol/week: 0.0 standard drinks of alcohol    Comment: occassional   Drug use: No   Social History   Socioeconomic History   Marital status: Married    Spouse name: Not on file   Number of children: Not on file   Years of education: Not on file   Highest  education level: Some college, no degree  Occupational History   Occupation: Administrator, 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[id number] [place]  Tobacco Use   Smoking status: Never   Smokeless tobacco: Never  Vaping Use   Vaping status: Never Used  Substance and Sexual Activity   Alcohol use: Yes    Alcohol/week: 0.0 standard drinks of alcohol    Comment: occassional   Drug use: No   Sexual activity: Yes    Partners: Male  Other Topics Concern   Not on file  Social History Narrative   Exercise-- no   Social Determinants of Health   Financial Resource Strain: Low Risk  (01/02/2023)   Overall Financial Resource Strain (CARDIA)    Difficulty of Paying Living Expenses: Not hard at all  Food Insecurity: No Food Insecurity (01/02/2023)   Hunger Vital Sign    Worried About Running Out of Food in the Last Year: Never true    Ran Out of Food in the Last Year: Never true  Transportation Needs: No Transportation Needs (01/02/2023)   PRAPARE - Administrator, Civil Service (Medical): No    Lack of Transportation (Non-Medical): No  Physical Activity: Unknown (01/02/2023)   Exercise Vital Sign    Days of Exercise per Week: Patient declined    Minutes of Exercise per Session: 10 min  Stress: No Stress Concern Present (01/02/2023)   [employer] of Occupational Health - Occupational Stress Questionnaire    Feeling of Stress : Not at all  Social Connections: Socially Integrated (01/02/2023)   Social Connection and Isolation Panel [NHANES]    Frequency of Communication with Friends and Family: More than three times a week    Frequency of Social Gatherings with Friends and Family: More than three times a week    Attends Religious Services: More than 4 times per year    Active Member of [employer] or Organizations: Yes    Attends Engineer, structural: More than 4 times per year    Marital Status: Married  Catering manager Violence: Not At Risk (03/19/2022)   Received from [employer],  [facility]   HITS    Over the last 12 months how often did your partner physically hurt you?: 1    Over the last 12 months how often did your partner insult you or talk down to you?: 1    Over the last 12 months how often did your partner threaten you with physical harm?: 1    Over the last 12 months how often did your partner scream or curse at you?: 1   Family Status  Relation Name Status   Mother  Alive   Father  Deceased at age 68   Other  (Not Specified)  No partnership data on file   Family History  Problem Relation Age of Onset  Throat cancer Father    Kidney failure Father    Asthma Other    Diabetes Other    Hyperlipidemia Other    Allergies  Allergen Reactions   Fish Allergy Hives and Swelling   Penicillins Anaphylaxis, Hives and Other (See Comments)    Has patient had a PCN reaction causing immediate rash, facial/tongue/throat swelling, SOB or lightheadedness with hypotension: Unknown Has patient had a PCN reaction causing severe rash involving mucus membranes or skin necrosis: No Has patient had a PCN reaction that required hospitalization: Yes Has patient had a PCN reaction occurring within the last 10 years: No If all of the above answers are "NO", then may proceed with Cephalosporin use.    Tizanidine Hcl Nausea And Vomiting, Rash and Shortness Of Breath   Iohexol Other (See Comments)     Desc: hives, sob, pt. needs 13 hr prep   01/16/05    Ivp Dye [Iodinated Contrast Media] Hives and Swelling   Morphine And Codeine Nausea And Vomiting      Review of Systems  Constitutional:  Negative for chills, fever and malaise/fatigue.  HENT:  Negative for congestion and hearing loss.   Eyes:  Negative for blurred vision and discharge.  Respiratory:  Negative for cough, sputum production and shortness of breath.   Cardiovascular:  Negative for chest pain, palpitations and leg swelling.  Gastrointestinal:  Negative for abdominal pain, blood in stool, constipation,  diarrhea, heartburn, nausea and vomiting.  Genitourinary:  Negative for dysuria, frequency, hematuria and urgency.  Musculoskeletal:  Negative for back pain, falls and myalgias.  Skin:  Negative for rash.  Neurological:  Negative for dizziness, sensory change, loss of consciousness, weakness and headaches.  Endo/Heme/Allergies:  Negative for environmental allergies. Does not bruise/bleed easily.  Psychiatric/Behavioral:  Negative for depression and suicidal ideas. The patient is not nervous/anxious and does not have insomnia.       Objective:     BP (!) 140/90 (BP Location: Right Arm, Patient Position: Sitting, Cuff Size: Large)   Pulse 73   Temp 98.5 F (36.9 C) (Oral)   Resp 18   Ht 4\' 11"  (1.499 m)   Wt 221 lb 6.4 oz (100.4 kg)   SpO2 96%   BMI 44.72 kg/m  BP Readings from Last 3 Encounters:  01/05/23 (!) 140/90  12/30/22 (!) 136/56  12/01/22 119/67   Wt Readings from Last 3 Encounters:  01/05/23 221 lb 6.4 oz (100.4 kg)  12/30/22 224 lb (101.6 kg)  12/01/22 225 lb (102.1 kg)   SpO2 Readings from Last 3 Encounters:  01/05/23 96%  12/30/22 93%  12/01/22 99%      Physical Exam Vitals and nursing note reviewed.  Constitutional:      General: She is not in acute distress.    Appearance: Normal appearance. She is well-developed.  HENT:     Head: Normocephalic and atraumatic.  Eyes:     General: No scleral icterus.       Right eye: No discharge.        Left eye: No discharge.  Cardiovascular:     Rate and Rhythm: Normal rate and regular rhythm.     Heart sounds: No murmur heard. Pulmonary:     Effort: Pulmonary effort is normal. No respiratory distress.     Breath sounds: Normal breath sounds.  Musculoskeletal:        General: Normal range of motion.     Cervical back: Normal range of motion and neck supple.  Right lower leg: No edema.     Left lower leg: No edema.  Skin:    General: Skin is warm and dry.  Neurological:     Mental Status: She is alert  and oriented to person, place, and time.  Psychiatric:        Mood and Affect: Mood normal.        Behavior: Behavior normal.        Thought Content: Thought content normal.        Judgment: Judgment normal.      No results found for any visits on 01/05/23.  Last CBC Lab Results  Component Value Date   WBC 11.0 (H) 12/31/2022   HGB 14.2 12/31/2022   HCT 45.2 12/31/2022   MCV 88.2 12/31/2022   MCH 28.3 08/29/2021   RDW 16.3 (H) 12/31/2022   PLT 273.0 12/31/2022   Last metabolic panel Lab Results  Component Value Date   GLUCOSE 76 12/01/2022   NA 143 12/01/2022   K 4.4 12/01/2022   CL 108 12/01/2022   CO2 27 12/01/2022   BUN 10 12/01/2022   CREATININE 0.75 12/01/2022   GFR 87.75 12/01/2022   CALCIUM 8.8 12/01/2022   PROT 6.9 12/01/2022   ALBUMIN 3.8 12/01/2022   BILITOT 0.3 12/01/2022   ALKPHOS 74 12/01/2022   AST 18 12/01/2022   ALT 22 12/01/2022   ANIONGAP 7 08/29/2021   Last lipids Lab Results  Component Value Date   CHOL 151 07/21/2022   HDL 29.80 (L) 07/21/2022   LDLCALC 101 (H) 10/28/2021   LDLDIRECT 100.0 07/21/2022   TRIG 297.0 (H) 07/21/2022   CHOLHDL 5 07/21/2022   Last hemoglobin A1c Lab Results  Component Value Date   HGBA1C 6.6 (H) 07/28/2022   Last thyroid functions Lab Results  Component Value Date   TSH 1.43 12/01/2022      The 10-year ASCVD risk score (Arnett DK, et al., 2019) is: 5.4%    Assessment & Plan:   Problem List Items Addressed This Visit       Unprioritized   Asthma with acute exacerbation   Relevant Medications   fluticasone-salmeterol (ADVAIR DISKUS) 250-50 MCG/ACT AEPB   Other Relevant Orders   Ambulatory referral to Pulmonology   SOB (shortness of breath) - Primary   Relevant Orders   Ambulatory referral to Pulmonology   Other Visit Diagnoses     Pulmonary nodules       Relevant Orders   Ambulatory referral to Pulmonology     Assessment and Plan    Asthma Persistent symptoms with exertion and  new pulmonary nodules on imaging. Patient has been using Advair and Ventolin with limited relief. -Increase Advair to . -Refer to pulmonology for further evaluation of pulmonary nodules and persistent symptoms.  Work-related stress Patient is experiencing stress related to potential job loss and difficulty managing illness with work demands. -Complete necessary paperwork for work absence due to illness.  General Health Maintenance Planned travel to [facility] next month. -Advise patient to continue asthma management while traveling and seek medical attention if symptoms worsen.        No follow-ups on file.    Donato Schultz, DO

## 2023-01-06 ENCOUNTER — Telehealth: Payer: Self-pay

## 2023-01-06 NOTE — Telephone Encounter (Signed)
Pt brought in forms for [employer] at her visit yesterday. Provider completed forms yesterday. Forms were stamped with office stamp and faxed today.

## 2023-01-07 ENCOUNTER — Encounter: Payer: Self-pay | Admitting: Family Medicine

## 2023-01-07 DIAGNOSIS — Z8669 Personal history of other diseases of the nervous system and sense organs: Secondary | ICD-10-CM

## 2023-01-07 DIAGNOSIS — H8110 Benign paroxysmal vertigo, unspecified ear: Secondary | ICD-10-CM

## 2023-01-07 MED ORDER — MECLIZINE HCL 25 MG PO CHEW: CHEWABLE_TABLET | ORAL | 1 refills | Status: DC

## 2023-01-07 MED ORDER — TOPIRAMATE 50 MG PO TABS: 50.0000 mg | ORAL_TABLET | Freq: Two times a day (BID) | ORAL | 1 refills | Status: DC

## 2023-01-12 ENCOUNTER — Encounter: Payer: Self-pay | Admitting: Family Medicine

## 2023-01-19 ENCOUNTER — Telehealth: Payer: Self-pay | Admitting: *Deleted

## 2023-01-19 ENCOUNTER — Other Ambulatory Visit: Payer: BC Managed Care – PPO

## 2023-01-19 NOTE — Telephone Encounter (Signed)
Pt came in for lab appt this morning.  No future lab orders in EPIC.  Per lab result note on 12/01/22 with Hyman Hopes, NP pt was to return to follow up on labs from that visit. Sent mychart message to staff to clarify which orders we were repeating. Was advised to cancel  labs for today as pt had a CBC in October with PCP and patient should continue current dose of iron tablets and follow up with PCP in 1 to 2 months.  Notified pt and scheduled lab OV with PCP on 03/02/23 at 8:40.

## 2023-02-02 ENCOUNTER — Other Ambulatory Visit: Payer: Self-pay | Admitting: Family Medicine

## 2023-02-02 ENCOUNTER — Encounter: Payer: Self-pay | Admitting: Family Medicine

## 2023-02-02 DIAGNOSIS — M170 Bilateral primary osteoarthritis of knee: Secondary | ICD-10-CM

## 2023-02-02 MED ORDER — AZELASTINE HCL 0.1 % NA SOLN: 1.0000 | Freq: Two times a day (BID) | NASAL | 12 refills | Status: DC

## 2023-02-02 NOTE — Telephone Encounter (Signed)
Requesting: [facility] Contract: 02/23 UDS: 04/2021 Last OV: 01/05/23 Next OV: 03/02/23 Last Refill:  11/24/2022, #[id number] RF Database:   Please advise

## 2023-02-14 ENCOUNTER — Encounter (HOSPITAL_BASED_OUTPATIENT_CLINIC_OR_DEPARTMENT_OTHER): Payer: Self-pay

## 2023-02-14 ENCOUNTER — Ambulatory Visit (HOSPITAL_BASED_OUTPATIENT_CLINIC_OR_DEPARTMENT_OTHER)
Admission: RE | Admit: 2023-02-14 | Discharge: 2023-02-14 | Disposition: A | Discharge status: Home / Self Care | Payer: BC Managed Care – PPO | Source: Ambulatory Visit | Attending: Internal Medicine | Admitting: Internal Medicine

## 2023-02-14 VITALS — BP 124/83 | HR 82 | Temp 97.9°F | Resp 18

## 2023-02-14 DIAGNOSIS — R1032 Left lower quadrant pain: Secondary | ICD-10-CM | POA: Diagnosis not present

## 2023-02-14 LAB — POCT URINALYSIS DIP (MANUAL ENTRY)
Bilirubin, UA: NEGATIVE
Blood, UA: NEGATIVE
Glucose, UA: NEGATIVE mg/dL
Ketones, POC UA: NEGATIVE mg/dL
Leukocytes, UA: NEGATIVE
Nitrite, UA: NEGATIVE
Protein Ur, POC: NEGATIVE mg/dL
Spec Grav, UA: 1.02 (ref 1.010–1.025)
Urobilinogen, UA: 0.2 U/dL
pH, UA: 7 (ref 5.0–8.0)

## 2023-02-14 NOTE — ED Triage Notes (Addendum)
C/O intermittent left lower back pain radiating around into left groin onset approx 2 wks ago. Denies injury. C/O pain with ambulation and movement. Also c/o foul smelling urine and occasionally low abd "pressure". Denies fevers. Has been taking her Robaxin and Tyl without significant relief.

## 2023-02-14 NOTE — Discharge Instructions (Signed)
Go to the emergency room for further evaluation    ??????????

## 2023-02-14 NOTE — ED Provider Notes (Signed)
[name]    CSN: [id number] Arrival date & time: 02/14/23  1032      History   Chief Complaint Chief Complaint  Patient presents with   Back Pain    HPI Deborah Watts is a 57 y.o. female.    Back Pain Associated symptoms: abdominal pain   Left-sided back pain onset about 2 weeks ago described as sharp, comes and goes, today is more persistent, pain radiates around to left lower quadrant and pelvic region.  States pain is "pretty bad" she took tramadol and Tylenol with some relief.  Unable to rate on a scale of 1-10.  Admits pelvic pressure.  Admits foul-smelling urine.  Admits diarrhea without blood. Admits occasional diarrhea.  Denies fever, chills, sweats, nausea, vomiting.  Had prior back pain secondary to back issues, states this pain is different.  Denies injury  Past Medical History:  Diagnosis Date   Anxiety    Arthritis    Asthma    Headache    migraines   History of hiatal hernia    repaired 30 yrs ago in [place]   Hypertension    MHA (microangiopathic hemolytic anemia) (HCC)     Patient Active Problem List   Diagnosis Date Noted   Pain of left calf 08/12/2022   Cellulitis of left lower extremity 08/12/2022   Edema, lower extremity 07/21/2022   SOB (shortness of breath) 07/21/2022   Hyperlipidemia 07/21/2022   Major depressive disorder, recurrent episode, moderate (HCC) 11/25/2021   Generalized anxiety disorder 11/25/2021   Depression with anxiety 10/28/2021   Asthma, chronic, unspecified asthma severity, uncomplicated 05/02/2021   Loud snoring 11/28/2019   Daytime sleepiness 11/28/2019   Other fatigue 11/28/2019   Primary osteoarthritis of left knee 08/02/2019   Rib pain 10/19/2018   Right knee pain 04/12/2017   Preventative health care 03/25/2016   Left ankle pain 01/14/2016   Sinusitis, acute 12/22/2014   Left leg pain 04/28/2014   Pain of right calf 04/20/2014   Acute bacterial sinusitis 04/14/2014   Obesity (BMI 30-39.9)  01/06/2013   Influenza 03/10/2011   Thrush, oral 03/04/2011   TINEA CRURIS 02/05/2010   HYPOKALEMIA 04/26/2009   Primary hypertension 02/08/2008   LOW BACK PAIN SYNDROME 05/26/2007   LEG CRAMPS 05/14/2007   Edema 05/14/2007   Chronic migraine without aura, with status migrainosus 05/01/2007   ASTHMATIC BRONCHITIS, ACUTE 10/15/2006   Anxiety state 04/20/2006   Asthma with acute exacerbation 04/20/2006   RESTLESS LEG SYNDROME, HX OF 04/20/2006    Past Surgical History:  Procedure Laterality Date   APPENDECTOMY N/A    BACK SURGERY     LAPAROSCOPIC CHOLECYSTECTOMY N/A 03/1992   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   [facility]   REPLACEMENT TOTAL KNEE Right 01/15/2021   [facility]   TOTAL ABDOMINAL HYSTERECTOMY Right    TUBAL LIGATION     WRIST ARTHROSCOPY Right 02/12/2018   Procedure: Right wrist arthroscopy, evaluation under anesthesia with debridement and repair as necessary and right carpal tunnel release;  Surgeon: Dominica Severin, MD;  Location: [facility] OR;  Service: Orthopedics;  Laterality: Right;  90 mins    OB History   No obstetric history on file.      Home Medications    Prior to Admission medications   Medication Sig Start Date End Date Taking? Authorizing Provider  albuterol (VENTOLIN HFA) 108 (90 Base) MCG/ACT inhaler Inhale 2 puffs into the lungs every 6 (six) hours as needed for wheezing or shortness of breath (Cough). 12/29/22  Yes Zola Button, Yvonne R, DO  ALPRAZolam Prudy Feeler) 0.5 MG tablet TAKE 1 TABLET BY MOUTH THREE TIMES PER DAY AS NEEDED 12/22/22  Yes Seabron Spates R, DO  azelastine (ASTELIN) 0.1 % nasal spray Place 1 spray into both nostrils 2 (two) times daily. Use in each nostril as directed 02/02/23  Yes Seabron Spates R, DO  diclofenac (VOLTAREN) 75 MG EC tablet TAKE ONE TABLET BY MOUTH TWICE DAILY 02/02/23  Yes Seabron Spates R, DO  escitalopram (LEXAPRO) 20 MG tablet Take 1 tablet (20 mg total) by mouth daily. 05/05/22  Yes Seabron Spates R, DO  fluticasone-salmeterol (ADVAIR DISKUS) 250-50 MCG/ACT AEPB Inhale 1 puff into the lungs in the morning and at bedtime. 01/05/23  Yes Seabron Spates R, DO  furosemide (LASIX) 40 MG tablet Take 1 tablet (40 mg total) by mouth daily. 11/25/22  Yes Seabron Spates R, DO  ipratropium (ATROVENT) 0.03 % nasal spray Place 2 sprays into both nostrils every 12 (twelve) hours. 10/11/21  Yes Margaretann Loveless, PA-C  Meclizine HCl (TRAVEL SICKNESS) 25 MG CHEW CHEW 1 TABLET EVERY 6 HOURS AS NEEDED 01/07/23  Yes Donato Schultz, DO  methocarbamol (ROBAXIN-750) 750 MG tablet Take 1 tablet (750 mg total) by mouth every 6 (six) hours as needed for muscle spasms. 06/10/22  Yes Seabron Spates R, DO  montelukast (SINGULAIR) 10 MG tablet Take 1 tablet (10 mg total) by mouth at bedtime. 05/05/22  Yes Seabron Spates R, DO  pregabalin (LYRICA) 225 MG capsule TAKE 1 CAPSULE BY MOUTH TWICE DAILY 02/02/23  Yes Seabron Spates R, DO  topiramate (TOPAMAX) 50 MG tablet Take 1 tablet (50 mg total) by mouth 2 (two) times daily. 01/07/23  Yes Seabron Spates R, DO  traMADol (ULTRAM) 50 MG tablet Take 1 tablet (50 mg total) by mouth every 8 (eight) hours as needed. 05/05/22  Yes Seabron Spates R, DO  triamcinolone cream (KENALOG) 0.1 % Apply 1 Application topically 2 (two) times daily. 01/27/22  Yes Clayborne Dana, NP  albuterol (PROVENTIL) (2.5 MG/3ML) 0.083% nebulizer solution Take 3 mLs (2.5 mg total) by nebulization every 6 (six) hours as needed for wheezing or shortness of breath. 12/30/22   Donato Schultz, DO  benzonatate (TESSALON) 200 MG capsule Take 1 capsule (200 mg total) by mouth 2 (two) times daily as needed for cough. 12/01/22   Clayborne Dana, NP  erythromycin base (E-MYCIN) 500 MG tablet Take 1 tablet (500 mg total) by mouth 2 (two) times daily. 11/25/22   Seabron Spates R, DO  fluticasone (FLONASE) 50 MCG/ACT nasal spray Place 2 sprays into both nostrils daily.  01/06/22   Freddy Finner, NP  Ketorolac Tromethamine (TORADOL ORAL PO) Take 10 mg by mouth every 6 (six) hours as needed. 03/16/22   [provider]  NONFORMULARY OR COMPOUNDED ITEM Compression socks  20-30 mm hg #1 as directed 08/18/22   Zola Button, Grayling Congress, DO  nystatin (MYCOSTATIN) 100000 UNIT/ML suspension Take 5 mLs (500,000 Units total) by mouth 4 (four) times daily. 01/27/19   Donato Schultz, DO  predniSONE (DELTASONE) 20 MG tablet Take 2 tablets (40 mg total) by mouth daily. 12/30/22   Donato Schultz, DO  predniSONE (DELTASONE) 50 MG tablet Take 1 tablet by mouth 13 hours, 7 hours, then 1 hour prior to procedure. 12/31/22   Donato Schultz, DO  promethazine-dextromethorphan (PROMETHAZINE-DM) 6.25-15 MG/5ML syrup  Take 5 mLs by mouth 4 (four) times daily as needed. 11/25/22   Seabron Spates R, DO  SUMAtriptan (IMITREX) 100 MG tablet TAKE 1 TABLET BY MOUTH AS NEEDED FOR MIGRAINE. REPEAT IN 2 HOURS IF NEEDED 01/15/22   Donato Schultz, DO    Family History Family History  Problem Relation Age of Onset   Throat cancer Father    Kidney failure Father    Asthma Other    Diabetes Other    Hyperlipidemia Other     Social History Social History   Tobacco Use   Smoking status: Never   Smokeless tobacco: Never  Vaping Use   Vaping status: Never Used  Substance Use Topics   Alcohol use: Not Currently    Comment: rare   Drug use: No     Allergies   Fish allergy, Penicillins, Tizanidine hcl, Iohexol, Ivp dye [iodinated contrast media], and Morphine and codeine   Review of Systems Review of Systems  Constitutional:  Negative for chills, diaphoresis and fatigue.  Respiratory:  Positive for cough.   Gastrointestinal:  Positive for abdominal pain and diarrhea. Negative for blood in stool, constipation, nausea and vomiting.  Musculoskeletal:  Positive for back pain.     Physical Exam Triage Vital Signs ED Triage Vitals  Encounter Vitals  Group     BP 02/14/23 1102 124/83     Systolic BP Percentile --      Diastolic BP Percentile --      Pulse Rate 02/14/23 1102 82     Resp 02/14/23 1102 18     Temp 02/14/23 1102 97.9 F (36.6 C)     Temp Source 02/14/23 1102 Oral     SpO2 02/14/23 1102 94 %     Weight --      Height --      Head Circumference --      Peak Flow --      Pain Score 02/14/23 1104 6     Pain Loc --      Pain Education --      Exclude from Growth Chart --    No data found.  Updated Vital Signs BP 124/83   Pulse 82   Temp 97.9 F (36.6 C) (Oral)   Resp 18   SpO2 94%   Visual Acuity Right Eye Distance:   Left Eye Distance:   Bilateral Distance:    Right Eye Near:   Left Eye Near:    Bilateral Near:     Physical Exam Vitals and nursing note reviewed.  Constitutional:      Appearance: She is obese. She is not ill-appearing.  HENT:     Head: Normocephalic and atraumatic.  Cardiovascular:     Rate and Rhythm: Normal rate.     Heart sounds: Normal heart sounds.  Pulmonary:     Effort: Pulmonary effort is normal.     Breath sounds: Normal breath sounds. No wheezing or rhonchi.  Abdominal:     Palpations: Abdomen is soft.     Tenderness: There is abdominal tenderness (LLQ and suprapubic). There is no right CVA tenderness or left CVA tenderness.  Skin:    General: Skin is warm and dry.  Psychiatric:        Mood and Affect: Mood normal.      UC Treatments / Results  Labs (all labs ordered are listed, but only abnormal results are displayed) Labs Reviewed  POCT URINALYSIS DIP (MANUAL ENTRY)    EKG   Radiology  No results found.  Procedures Procedures (including critical care time)  Medications Ordered in UC Medications - No data to display  Initial Impression / Assessment and Plan / UC Course  I have reviewed the triage vital signs and the nursing notes.  Pertinent labs & imaging results that were available during my care of the patient were reviewed by me and  considered in my medical decision making (see chart for details).     57 year old female with left-sided low back pain that radiates to her left groin and suprapubic region.  Vital signs are stable she is nontoxic-appearing however has left lower quadrant tenderness on exam.  She reports some urinary symptoms but point-of-care urinalysis is normal.  Recommend ED evaluation for possible imaging.  She was stable upon discharge Final Clinical Impressions(s) / UC Diagnoses   Final diagnoses:  Abdominal pain, left lower quadrant     Discharge Instructions      Go to the emergency room for further evaluation     ED Prescriptions   None    PDMP not reviewed this encounter.   Meliton Rattan, [place] 02/14/23 1155

## 2023-02-20 ENCOUNTER — Ambulatory Visit (INDEPENDENT_AMBULATORY_CARE_PROVIDER_SITE_OTHER): Payer: BC Managed Care – PPO | Admitting: Emergency Medicine

## 2023-02-20 ENCOUNTER — Encounter: Payer: Self-pay | Admitting: Emergency Medicine

## 2023-02-20 VITALS — BP 116/84 | HR 72 | Temp 98.1°F | Ht 59.0 in | Wt 228.0 lb

## 2023-02-20 DIAGNOSIS — R918 Other nonspecific abnormal finding of lung field: Secondary | ICD-10-CM | POA: Diagnosis not present

## 2023-02-20 DIAGNOSIS — J301 Allergic rhinitis due to pollen: Secondary | ICD-10-CM

## 2023-02-20 DIAGNOSIS — R29818 Other symptoms and signs involving the nervous system: Secondary | ICD-10-CM

## 2023-02-20 DIAGNOSIS — R0683 Snoring: Secondary | ICD-10-CM

## 2023-02-20 DIAGNOSIS — R0602 Shortness of breath: Secondary | ICD-10-CM | POA: Diagnosis not present

## 2023-02-20 DIAGNOSIS — J309 Allergic rhinitis, unspecified: Secondary | ICD-10-CM | POA: Insufficient documentation

## 2023-02-20 NOTE — Assessment & Plan Note (Signed)
Exertional Dyspnea Longstanding history of asthma, currently on Advair 250/50 and Singulair. Noted to have inconsistent response to Albuterol. CT chest showed mild mosaic attenuation suggestive of possible air trapping.  Question whether this is principally asthma versus other superimposed contributors including obesity and deconditioning.  She has had weight gain over the last several years -Continue Advair 250/50 and Singulair. -Order formal pulmonary function tests to assess lung capacity and air flows. -Consider impact of recent weight gain on dyspnea.

## 2023-02-20 NOTE — Patient Instructions (Signed)
VISIT SUMMARY:  Today, we discussed your ongoing issues with shortness of breath, asthma, and nasal congestion. We also reviewed the findings from your recent CT scan, which showed new pulmonary nodules. Additionally, we addressed your symptoms of possible sleep apnea.  YOUR PLAN:  -EXERTIONAL DYSPNEA: Exertional dyspnea means experiencing shortness of breath during physical activity. We will continue your current medications, Advair and Singulair, and have ordered pulmonary function tests to better understand your lung capacity and airflow. We will also consider how your recent weight gain might be affecting your breathing.  -PULMONARY NODULES: Pulmonary nodules are small growths in the lungs. Your recent CT scan showed new nodules, so we will repeat the CT scan in April 2025 to monitor for any changes.  -ALLERGIC RHINITIS: Allergic rhinitis is inflammation of the nasal passages due to allergies, causing congestion and a runny nose. You should continue using your current nasal sprays, Astelin and Atrovent, as needed.  -POSSIBLE SLEEP APNEA: Sleep apnea is a condition where breathing repeatedly stops and starts during sleep. We have ordered a home sleep study to evaluate this further.  -GENERAL HEALTH MAINTENANCE: Continue your current medications and treatments for hypertension and migraines as managed by your primary care provider.  INSTRUCTIONS:  Please follow up with the pulmonary function tests as ordered. Additionally, schedule your repeat CT chest scan for April 2025 to monitor the pulmonary nodules. Complete the home sleep study to evaluate for sleep apnea.

## 2023-02-20 NOTE — Assessment & Plan Note (Signed)
Possible Sleep Apnea Reports snoring and nocturnal coughing. Family members have observed abnormal breathing during sleep. -Order home sleep study to evaluate for sleep apnea.

## 2023-02-20 NOTE — Progress Notes (Signed)
Subjective:    Patient ID: Deborah Watts, female    DOB: 1965/09/08, 57 y.o.   MRN: [id number]  HPI The patient, a 57 year old non-smoker with a history of hypertension, migraine headaches, and asthma, presents for evaluation of exertional shortness of breath. The patient has been treated with Advair and Singulair. A CT angiography was performed to evaluate the dyspnea, which showed no evidence of pulmonary embolism, normal heart size, no mediastinal or hilar adenopathy, and mild mosaic attenuation suggestive of possible air trapping. However, a new 3mm solid right lower lobe pulmonary nodule and a 7mm right lower lobe ground glass pulmonary nodule were noted.  The patient reports a lifelong history of asthma, which was not treated in childhood but has worsened in adulthood. The patient is currently on inhaled medication, specifically Advair, which initially caused thrush but is now tolerated. 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  The patient also reports frequent coughing and waking up at night due to coughing. The patient has a history of heartburn and reflux, which was addressed with a hernia repair. The patient has never smoked and has no known exposure to harmful substances. The patient has gained significant weight over the past couple of years.  The patient also reports frequent nasal congestion and drainage, which is currently being managed with Singulair, Astelin, and Atrovent nose sprays. The patient has a history of knee replacements and back surgery. The patient's family reports that the patient snores heavily, suggesting possible sleep apnea.   RADIOLOGY Chest CT angiography: No evidence of pulmonary embolism, normal heart size, no mediastinal or hilar adenopathy, mild mosaic attenuation suggestive of possible air trapping without any consolidation,  effusion or pneumothorax. New 3 mm solid right lower lobe pulmonary nodule, new 7 mm ground glass pulmonary nodule in the right lower lobe. (01/01/2023)   Review of Systems As per HPI  Past Medical History:  Diagnosis Date   Anxiety    Arthritis    Asthma    Headache    migraines   History of hiatal hernia    repaired 30 yrs ago in [place]   Hypertension    MHA (microangiopathic hemolytic anemia) (HCC)      Family History  Problem Relation Age of Onset   Throat cancer Father    Kidney failure Father    Asthma Other    Diabetes Other    Hyperlipidemia Other      Social History   Socioeconomic History   Marital status: Married    Spouse name: Not on file   Number of children: Not on file   Years of education: Not on file   Highest education level: Some college, no degree  Occupational History   Occupation: Administrator, 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[employer] [place]  Tobacco Use   Smoking status: Never   Smokeless tobacco: Never  Vaping Use   Vaping status: Never Used  Substance and Sexual Activity   Alcohol use: Not Currently    Comment: rare   Drug use: No   Sexual activity: Not on file  Other Topics Concern   Not on file  Social History Narrative   Exercise-- no   Social Determinants of Health   Financial Resource Strain: Low Risk  (01/02/2023)   Overall Financial Resource Strain (CARDIA)    Difficulty of Paying Living Expenses: Not hard at all  Food Insecurity:  No Food Insecurity (01/02/2023)   Hunger Vital Sign    Worried About Running Out of Food in the Last Year: Never true    Ran Out of Food in the Last Year: Never true  Transportation Needs: No Transportation Needs (01/02/2023)   PRAPARE - Administrator, Civil Service (Medical): No    Lack of Transportation (Non-Medical): No  Physical Activity: Unknown (01/02/2023)   Exercise Vital Sign    Days of Exercise per Week: Patient declined    Minutes of Exercise per Session: 10 min  Stress: No  Stress Concern Present (01/02/2023)   [employer] of Occupational Health - Occupational Stress Questionnaire    Feeling of Stress : Not at all  Social Connections: Socially Integrated (01/02/2023)   Social Connection and Isolation Panel [NHANES]    Frequency of Communication with Friends and Family: More than three times a week    Frequency of Social Gatherings with Friends and Family: More than three times a week    Attends Religious Services: More than 4 times per year    Active Member of [employer] or Organizations: Yes    Attends Engineer, structural: More than 4 times per year    Marital Status: Married  Catering manager Violence: Not At Risk (03/19/2022)   Received from [facility], [facility]   HITS    Over the last 12 months how often did your partner physically hurt you?: Never    Over the last 12 months how often did your partner insult you or talk down to you?: Never    Over the last 12 months how often did your partner threaten you with physical harm?: Never    Over the last 12 months how often did your partner scream or curse at you?: Never    - Non-smoker - Works at [employer] as a Conservation officer, nature.  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 -No other significant exposures  Allergies  Allergen Reactions   Fish Allergy Hives and Swelling   Penicillins Anaphylaxis, Hives and Other (See Comments)    Has patient had a PCN reaction causing immediate rash, facial/tongue/throat swelling, SOB or lightheadedness with hypotension: Unknown Has patient had a PCN reaction causing severe rash involving mucus membranes or skin necrosis: No Has patient had a PCN reaction that required hospitalization: Yes Has patient had a PCN reaction occurring within the last 10 years: No If all of the above answers are "NO", then may proceed with Cephalosporin use.    Tizanidine Hcl Nausea And Vomiting, Rash and Shortness Of Breath   Iohexol Other (See  Comments)     Desc: hives, sob, pt. needs 13 hr prep   01/16/05    Ivp Dye [Iodinated Contrast Media] Hives and Swelling   Morphine And Codeine Nausea And Vomiting     Outpatient Medications Prior to Visit  Medication Sig Dispense Refill   albuterol (PROVENTIL) (2.5 MG/3ML) 0.083% nebulizer solution Take 3 mLs (2.5 mg total) by nebulization every 6 (six) hours as needed for wheezing or shortness of breath. 150 mL 1   albuterol (VENTOLIN HFA) 108 (90 Base) MCG/ACT inhaler Inhale 2 puffs into the lungs every 6 (six) hours as needed for wheezing or shortness of breath (Cough). 18 g 3   ALPRAZolam (XANAX) 0.5 MG tablet TAKE 1 TABLET BY MOUTH THREE TIMES PER DAY AS NEEDED 90 tablet 1   azelastine (ASTELIN) 0.1 % nasal spray Place 1 spray into both nostrils  2 (two) times daily. Use in each nostril as directed 30 mL 12   benzonatate (TESSALON) 200 MG capsule Take 1 capsule (200 mg total) by mouth 2 (two) times daily as needed for cough. 20 capsule 0   diclofenac (VOLTAREN) 75 MG EC tablet TAKE ONE TABLET BY MOUTH TWICE DAILY 60 tablet 1   erythromycin base (E-MYCIN) 500 MG tablet Take 1 tablet (500 mg total) by mouth 2 (two) times daily. 20 tablet 0   escitalopram (LEXAPRO) 20 MG tablet Take 1 tablet (20 mg total) by mouth daily. 90 tablet 3   fluticasone (FLONASE) 50 MCG/ACT nasal spray Place 2 sprays into both nostrils daily. 16 g 0   fluticasone-salmeterol (ADVAIR DISKUS) 250-50 MCG/ACT AEPB Inhale 1 puff into the lungs in the morning and at bedtime. 1 each 11   furosemide (LASIX) 40 MG tablet Take 1 tablet (40 mg total) by mouth daily. 30 tablet 3   ipratropium (ATROVENT) 0.03 % nasal spray Place 2 sprays into both nostrils every 12 (twelve) hours. 30 mL 0   Ketorolac Tromethamine (TORADOL ORAL PO) Take 10 mg by mouth every 6 (six) hours as needed.     Meclizine HCl (TRAVEL SICKNESS) 25 MG CHEW CHEW 1 TABLET EVERY 6 HOURS AS NEEDED 30 tablet 1   methocarbamol (ROBAXIN-750) 750 MG tablet Take 1  tablet (750 mg total) by mouth every 6 (six) hours as needed for muscle spasms. 45 tablet 0   montelukast (SINGULAIR) 10 MG tablet Take 1 tablet (10 mg total) by mouth at bedtime. 90 tablet 3   NONFORMULARY OR COMPOUNDED ITEM Compression socks  20-30 mm hg #1 as directed 1 each 1   nystatin (MYCOSTATIN) 100000 UNIT/ML suspension Take 5 mLs (500,000 Units total) by mouth 4 (four) times daily. 60 mL 0   predniSONE (DELTASONE) 20 MG tablet Take 2 tablets (40 mg total) by mouth daily. 10 tablet 0   predniSONE (DELTASONE) 50 MG tablet Take 1 tablet by mouth 13 hours, 7 hours, then 1 hour prior to procedure. 3 tablet 0   pregabalin (LYRICA) 225 MG capsule TAKE 1 CAPSULE BY MOUTH TWICE DAILY 60 capsule 1   promethazine-dextromethorphan (PROMETHAZINE-DM) 6.25-15 MG/5ML syrup Take 5 mLs by mouth 4 (four) times daily as needed. 118 mL 0   SUMAtriptan (IMITREX) 100 MG tablet TAKE 1 TABLET BY MOUTH AS NEEDED FOR MIGRAINE. REPEAT IN 2 HOURS IF NEEDED 12 tablet 3   topiramate (TOPAMAX) 50 MG tablet Take 1 tablet (50 mg total) by mouth 2 (two) times daily. 180 tablet 1   traMADol (ULTRAM) 50 MG tablet Take 1 tablet (50 mg total) by mouth every 8 (eight) hours as needed. 30 tablet 0   triamcinolone cream (KENALOG) 0.1 % Apply 1 Application topically 2 (two) times daily. 30 g 0   No facility-administered medications prior to visit.        Objective:   Physical Exam  Vitals:   02/20/23 0848  BP: 116/84  Pulse: 72  Temp: 98.1 F (36.7 C)  TempSrc: Oral  SpO2: 97%  Weight: 228 lb (103.4 kg)  Height: 4\' 11"  (1.499 m)    Gen: Pleasant, obese woman, in no distress,  normal affect  ENT: No lesions,  mouth clear,  oropharynx clear, no postnasal drip  Neck: No JVD, no stridor  Lungs: No use of accessory muscles, shallow breaths decreased to both bases, no wheezes or crackles  Cardiovascular: RRR, heart sounds normal, no murmur or gallops, no peripheral edema  Musculoskeletal: No  deformities, no  cyanosis or clubbing  Neuro: alert, awake, non focal  Skin: Warm, no lesions or rash     Assessment & Plan:  SOB (shortness of breath) Exertional Dyspnea Longstanding history of asthma, currently on Advair 250/50 and Singulair. Noted to have inconsistent response to Albuterol. CT chest showed mild mosaic attenuation suggestive of possible air trapping.  Question whether this is principally asthma versus other superimposed contributors including obesity and deconditioning.  She has had weight gain over the last several years -Continue Advair 250/50 and Singulair. -Order formal pulmonary function tests to assess lung capacity and air flows. -Consider impact of recent weight gain on dyspnea.  Pulmonary nodules Pulmonary Nodules New 3mm solid right lower lobe nodule and 7mm ground glass right lower lobe nodule noted on recent CT chest. No history of smoking. -Schedule repeat CT chest in April 2025 to monitor nodules for changes.  Suspected sleep apnea Possible Sleep Apnea Reports snoring and nocturnal coughing. Family members have observed abnormal breathing during sleep. -Order home sleep study to evaluate for sleep apnea.  Allergic rhinitis Allergic Rhinitis Reports nasal congestion and bloody nose, currently on Astelin and Atrovent nasal sprays. -Continue current nasal sprays as needed.   Levy Pupa, MD, PhD 02/20/2023, 9:13 AM [facility] [phone] or if no answer before 7:00PM call [phone] For any issues after 7:00PM please call eLink [phone]

## 2023-02-20 NOTE — Assessment & Plan Note (Signed)
Pulmonary Nodules New 3mm solid right lower lobe nodule and 7mm ground glass right lower lobe nodule noted on recent CT chest. No history of smoking. -Schedule repeat CT chest in April 2025 to monitor nodules for changes.

## 2023-02-20 NOTE — Assessment & Plan Note (Signed)
Allergic Rhinitis Reports nasal congestion and bloody nose, currently on Astelin and Atrovent nasal sprays. -Continue current nasal sprays as needed.

## 2023-03-02 ENCOUNTER — Ambulatory Visit (INDEPENDENT_AMBULATORY_CARE_PROVIDER_SITE_OTHER): Payer: BC Managed Care – PPO | Admitting: Family Medicine

## 2023-03-02 VITALS — BP 140/90 | HR 75 | Temp 98.0°F | Resp 20 | Ht 59.0 in | Wt 227.0 lb

## 2023-03-02 DIAGNOSIS — R1032 Left lower quadrant pain: Secondary | ICD-10-CM | POA: Diagnosis not present

## 2023-03-02 DIAGNOSIS — F411 Generalized anxiety disorder: Secondary | ICD-10-CM

## 2023-03-02 DIAGNOSIS — D509 Iron deficiency anemia, unspecified: Secondary | ICD-10-CM | POA: Insufficient documentation

## 2023-03-02 DIAGNOSIS — R195 Other fecal abnormalities: Secondary | ICD-10-CM

## 2023-03-02 DIAGNOSIS — R350 Frequency of micturition: Secondary | ICD-10-CM | POA: Insufficient documentation

## 2023-03-02 LAB — POC URINALSYSI DIPSTICK (AUTOMATED)
Bilirubin, UA: NEGATIVE
Blood, UA: NEGATIVE
Glucose, UA: NEGATIVE
Ketones, UA: NEGATIVE
Leukocytes, UA: NEGATIVE
Nitrite, UA: NEGATIVE
Protein, UA: NEGATIVE
Spec Grav, UA: <=1.005 — AB (ref 1.010–1.025)
Urobilinogen, UA: 0.2 U/dL
pH, UA: 7 (ref 5.0–8.0)

## 2023-03-02 LAB — CBC WITH DIFFERENTIAL/PLATELET
Basophils Absolute: 0.1 10*3/uL (ref 0.0–0.1)
Basophils Relative: 0.7 % (ref 0.0–3.0)
Eosinophils Absolute: 0.5 10*3/uL (ref 0.0–0.7)
Eosinophils Relative: 4.9 % (ref 0.0–5.0)
HCT: 44 % (ref 36.0–46.0)
Hemoglobin: 14.6 g/dL (ref 12.0–15.0)
Lymphocytes Relative: 23.4 % (ref 12.0–46.0)
Lymphs Abs: 2.3 10*3/uL (ref 0.7–4.0)
MCHC: 33.1 g/dL (ref 30.0–36.0)
MCV: 87 fL (ref 78.0–100.0)
Monocytes Absolute: 0.5 10*3/uL (ref 0.1–1.0)
Monocytes Relative: 5.1 % (ref 3.0–12.0)
Neutro Abs: 6.5 10*3/uL (ref 1.4–7.7)
Neutrophils Relative %: 65.9 % (ref 43.0–77.0)
Platelets: 295 10*3/uL (ref 150.0–400.0)
RBC: 5.06 Mil/uL (ref 3.87–5.11)
RDW: 15.7 % — ABNORMAL HIGH (ref 11.5–15.5)
WBC: 9.8 10*3/uL (ref 4.0–10.5)

## 2023-03-02 LAB — TSH: TSH: 1.26 u[IU]/mL (ref 0.35–5.50)

## 2023-03-02 LAB — LIPID PANEL
Cholesterol: 158 mg/dL (ref 0–200)
HDL: 33.2 mg/dL — ABNORMAL LOW (ref >39.00)
LDL Cholesterol: 86 mg/dL (ref 0–99)
NonHDL: 124.6
Total CHOL/HDL Ratio: 5
Triglycerides: 193 mg/dL — ABNORMAL HIGH (ref 0.0–149.0)
VLDL: 38.6 mg/dL (ref 0.0–40.0)

## 2023-03-02 LAB — LIPASE: Lipase: 16 U/L (ref 11.0–59.0)

## 2023-03-02 LAB — AMYLASE: Amylase: 18 U/L — ABNORMAL LOW (ref 27–131)

## 2023-03-02 MED ORDER — ALPRAZOLAM 0.5 MG PO TABS: ORAL_TABLET | ORAL | 1 refills | Status: DC

## 2023-03-02 NOTE — Progress Notes (Signed)
Established Patient Office Visit  Subjective   Patient ID: Deborah Watts, female    DOB: 09/11/1965  Age: 57 y.o. MRN: [id number]  Chief Complaint  Patient presents with   Shortness of Breath   Labs   Follow-up    HPI Discussed the use of AI scribe software for clinical note transcription with the patient, who gave verbal consent to proceed.  History of Present Illness   The patient, with a history of iron deficiency, presented for a follow-up visit after a recent urgent care visit for right-sided abdominal pain. The patient reported that the urgent care provider suspected diverticulitis and advised her to go to the emergency room, but the patient chose to go home instead. The patient still experiences intermittent right-sided abdominal pain, which she speculated might be related to a possible ovarian cyst or failing ovary.  The patient also reported a recent diagnosis of tendonitis in her foot, for which she was advised to wear a boot. She noted that her foot does not currently hurt. The patient also mentioned increased frequency of urination, which she attributed to high water intake.  The patient also reported experiencing restless legs at night, particularly after days of work. She noted that this symptom is not present on her days off. The patient is currently taking Xanax to manage this symptom.  The patient also mentioned a recent job interview for a position that would involve less standing, which she hoped would alleviate her leg symptoms. She also expressed a desire to lose weight and frustration with her insurance company's refusal to cover weight loss medication unless she has diabetes.  The patient confirmed that she is still taking Lasix as needed, but noted that it is difficult to take at work due to the increased need to urinate. She also reported that she is trying to reduce her intake of Coca[employer]a.  The patient's recent blood work showed high triglycerides, and she was  advised to take fish oil, which she declined. She was also prescribed fenofibrate. The patient's B12 levels were normal at her last check.  The patient also reported that she has been experiencing lactose intolerance and has switched to drinking almond milk. She also mentioned that she has been drinking black coffee, which she finds unpalatable.      Patient Active Problem List   Diagnosis Date Noted   Left lower quadrant abdominal pain 03/02/2023   Urinary frequency 03/02/2023   Iron deficiency anemia 03/02/2023   Pulmonary nodules 02/20/2023   Suspected sleep apnea 02/20/2023   Allergic rhinitis 02/20/2023   Pain of left calf 08/12/2022   Cellulitis of left lower extremity 08/12/2022   Edema, lower extremity 07/21/2022   SOB (shortness of breath) 07/21/2022   Hyperlipidemia 07/21/2022   Major depressive disorder, recurrent episode, moderate (HCC) 11/25/2021   Generalized anxiety disorder 11/25/2021   Depression with anxiety 10/28/2021   Asthma, chronic, unspecified asthma severity, uncomplicated 05/02/2021   Loud snoring 11/28/2019   Daytime sleepiness 11/28/2019   Other fatigue 11/28/2019   Primary osteoarthritis of left knee 08/02/2019   Rib pain 10/19/2018   Right knee pain 04/12/2017   Preventative health care 03/25/2016   Left ankle pain 01/14/2016   Left leg pain 04/28/2014   Pain of right calf 04/20/2014   Obesity (BMI 30-39.9) 01/06/2013   Thrush, oral 03/04/2011   TINEA CRURIS 02/05/2010   HYPOKALEMIA 04/26/2009   Primary hypertension 02/08/2008   LOW BACK PAIN SYNDROME 05/26/2007   LEG CRAMPS 05/14/2007  Edema 05/14/2007   Chronic migraine without aura, with status migrainosus 05/01/2007   ASTHMATIC BRONCHITIS, ACUTE 10/15/2006   Anxiety state 04/20/2006   RESTLESS LEG SYNDROME, HX OF 04/20/2006   Past Medical History:  Diagnosis Date   Anxiety    Arthritis    Asthma    Headache    migraines   History of hiatal hernia    repaired 30 yrs ago in [place]   Hypertension    MHA (microangiopathic hemolytic anemia) (HCC)    Past Surgical History:  Procedure Laterality Date   APPENDECTOMY N/A    BACK SURGERY     LAPAROSCOPIC CHOLECYSTECTOMY N/A 03/1992   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   RE[facility]AC[facility] TOTAL KNEE Right [facility]/15/2021   [facility]   TOTAL ABDOMINAL HYSTERECTOMY Right    TUBAL LIGATION     WRIST ARTHROSCOPY Right 02/12/2018   Procedure: Right wrist arthroscopy, evaluation under anesthesia with debridement and repair as necessary and right carpal tunnel release;  Surgeon: Dominica Severin, MD;  Location: [facility] OR;  Service: Orthopedics;  Laterality: Right;  90 mins   Social History   Tobacco Use   Smoking status: Never   Smokeless tobacco: Never  Vaping Use   Vaping status: Never Used  Substance Use Topics   Alcohol use: Not Currently    Comment: rare   Drug use: No   Social History   Socioeconomic History   Marital status: Married    Spouse name: Not on file   Number of children: Not on file   Years of education: Not on file   Highest education level: Some college, no degree  Occupational History   Occupation: Administrator, 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[employer] [place]  Tobacco Use   Smoking status: Never   Smokeless tobacco: Never  Vaping Use   Vaping status: Never Used  Substance and Sexual Activity   Alcohol use: Not Currently    Comment: rare   Drug use: No   Sexual activity: Not on file  Other Topics Concern   Not on file  Social History Narrative   Exercise-- no   Social Drivers of Health   Financial Resource Strain: Low Risk  (03/02/2023)   Overall Financial Resource Strain (CARDIA)    Difficulty of Paying Living Expenses: Not very hard  Food Insecurity: No Food Insecurity (03/02/2023)   Hunger Vital Sign    Worried About Running Out of Food in the Last Year: Never true    Ran Out of Food in the Last Year: Never true  Transportation Needs: No Transportation Needs (03/02/2023)   PRAPARE -  Administrator, Civil Service (Medical): No    Lack of Transportation (Non-Medical): No  Physical Activity: Inactive (03/02/2023)   Exercise Vital Sign    Days of Exercise per Week: 0 days    Minutes of Exercise per Session: 10 min  Stress: No Stress Concern Present (03/02/2023)   [employer] of Occupational Health - Occupational Stress Questionnaire    Feeling of Stress : Only a little  Social Connections: Socially Integrated (03/02/2023)   Social Connection and Isolation Panel [NHANES]    Frequency of Communication with Friends and Family: More than three times a week    Frequency of Social Gatherings with Friends and Family: Once a week    Attends Religious Services: More than 4 times per year    Active Member of [employer] or Organizations: Yes    Attends Banker Meetings: 1 to  4 times per year    Marital Status: Married  Catering manager Violence: Not At Risk (03/19/2022)   Received from [facility]   HITS    Over the last 12 months how often did your partner physically hurt you?: Never    Over the last 12 months how often did your partner insult you or talk down to you?: Never    Over the last 12 months how often did your partner threaten you with physical harm?: Never    Over the last 12 months how often did your partner scream or curse at you?: Never   Family Status  Relation Name Status   Mother  Alive   Father  Deceased at age 46   Other  (Not Specified)  No partnership data on file   Family History  Problem Relation Age of Onset   Throat cancer Father    Kidney failure Father    Asthma Other    Diabetes Other    Hyperlipidemia Other    Allergies  Allergen Reactions   Fish Allergy Hives and Swelling   Penicillins Anaphylaxis, Hives and Other (See Comments)    Has patient had a PCN reaction causing immediate rash, facial/tongue/throat swelling, SOB or lightheadedness with hypotension: Unknown Has patient had a PCN reaction  causing severe rash involving mucus membranes or skin necrosis: No Has patient had a PCN reaction that required hospitalization: Yes Has patient had a PCN reaction occurring within the last 10 years: No If all of the above answers are "NO", then may proceed with Cephalosporin use.    Tizanidine Hcl Nausea And Vomiting, Rash and Shortness Of Breath   Iohexol Other (See Comments)     Desc: hives, sob, pt. needs 13 hr prep   01/16/05    Ivp Dye [Iodinated Contrast Media] Hives and Swelling   Morphine And Codeine Nausea And Vomiting      ROS    Objective:     BP (!) 140/90 (BP Location: Left Arm, Patient Position: Sitting, Cuff Size: Large)   Pulse 75   Temp 98 F (36.7 C) (Oral)   Resp 20   Ht 4\' 11"  (1.499 m)   Wt 227 lb (103 kg)   SpO2 98%   BMI 45.85 kg/m  BP Readings from Last 3 Encounters:  03/02/23 (!) 140/90  02/20/23 116/84  02/14/23 124/83   Wt Readings from Last 3 Encounters:  03/02/23 227 lb (103 kg)  02/20/23 228 lb (103.4 kg)  01/05/23 221 lb 6.4 oz (100.4 kg)   SpO2 Readings from Last 3 Encounters:  03/02/23 98%  02/20/23 97%  02/14/23 94%      Physical Exam   Results for orders placed or performed in visit on 03/02/23  POCT Urinalysis Dipstick (Automated)  Result Value Ref Range   Color, UA yellow    Clarity, UA clear    Glucose, UA Negative Negative   Bilirubin, UA Negative    Ketones, UA Negative    Spec Grav, UA <=1.005 (A) 1.010 - 1.025   Blood, UA Negative    pH, UA 7.0 5.0 - 8.0   Protein, UA Negative Negative   Urobilinogen, UA 0.2 0.2 or 1.0 E.U./dL   Nitrite, UA Negative    Leukocytes, UA Negative Negative    Last CBC Lab Results  Component Value Date   WBC 11.0 (H) 12/31/2022   HGB 14.2 12/31/2022   HCT 45.2 12/31/2022   MCV 88.2 12/31/2022   MCH 28.3 08/29/2021  RDW 16.3 (H) 12/31/2022   PLT 273.0 12/31/2022   Last metabolic panel Lab Results  Component Value Date   GLUCOSE 76 12/01/2022   NA 143 12/01/2022   K  4.4 12/01/2022   CL 108 12/01/2022   CO2 27 12/01/2022   BUN 10 12/01/2022   CREATININE 0.75 12/01/2022   GFR 87.75 12/01/2022   CALCIUM 8.8 12/01/2022   PROT 6.9 12/01/2022   ALBUMIN 3.8 12/01/2022   BILITOT 0.3 12/01/2022   ALKPHOS 74 12/01/2022   AST 18 12/01/2022   ALT 22 12/01/2022   ANIONGAP 7 08/29/2021   Last lipids Lab Results  Component Value Date   CHOL 151 07/21/2022   HDL 29.80 (L) 07/21/2022   LDLCALC 101 (H) 10/28/2021   LDLDIRECT 100.0 07/21/2022   TRIG 297.0 (H) 07/21/2022   CHOLHDL 5 07/21/2022   Last hemoglobin A1c Lab Results  Component Value Date   HGBA1C 6.6 (H) 07/28/2022   Last thyroid functions Lab Results  Component Value Date   TSH 1.43 12/01/2022   Last vitamin D No results found for: "25OHVITD2", "25OHVITD3", "VD25OH" Last vitamin B12 and Folate Lab Results  Component Value Date   VITAMINB12 383 12/01/2022   FOLATE 9.7 12/01/2022      The 10-year ASCVD risk score (Arnett DK, et al., 2019) is: 5.4%    Assessment & Plan:   Problem List Items Addressed This Visit       Unprioritized   Generalized anxiety disorder   Relevant Medications   ALPRAZolam (XANAX) 0.5 MG tablet   Urinary frequency   Relevant Orders   POCT Urinalysis Dipstick (Automated) (Completed)   Left lower quadrant abdominal pain - Primary   Relevant Orders   CBC with Differential/Platelet   Iron, TIBC and Ferritin Panel   Lipid panel   Amylase   Lipase   TSH   US Abdomen Complete   US Pelvic Complete With Transvaginal   Iron deficiency anemia   Relevant Orders   Fecal occult blood, imunochemical([facility])   Other Visit Diagnoses       Morbid obesity (HCC)       Relevant Orders   Insulin, random     Assessment and Plan    Abdominal Pain   She experiences intermittent abdominal pain, possibly related to an ovarian cyst or another gynecological issue, without acute symptoms. We discussed the need for further evaluation if symptoms  persist. We will order a urine test, a complete blood panel, and provide a stool sample kit for home use.  Edema   She reports leg swelling, particularly after prolonged standing, and intermittent use of Lasix. We discussed the benefits of compression socks and the consistent use of Lasix as needed. We recommend the use of compression socks and advise continued use of Lasix as needed.  Iron Deficiency   She has low iron levels managed with alternate-day iron supplements. We discussed monitoring iron levels and the potential need for a specialist referral if levels remain low. We will order a complete blood panel including iron levels and provide a stool sample kit to check for blood in stool.  Hypertriglyceridemia   She has high triglycerides and is not currently on fish oil. We discussed the potential use of fenofibrate and the importance of lifestyle modifications. We will order a complete blood panel including triglycerides and discuss the potential use of fenofibrate if triglycerides remain high.  Tendonitis   She reported tendonitis with a recommendation to use a boot, without severe pain. We emphasized  the importance of boot use to prevent further injury. We advise continued use of the boot as needed.  General Health Maintenance   She has concerns about weight loss and insurance coverage for weight loss medication. We discussed potential new insurance coverage and the importance of lifestyle modifications. We will discuss potential changes in insurance coverage with her new job and advise on lifestyle modifications for weight management.  Follow-up   We will schedule a follow-up appointment after lab results are available.        Return if symptoms worsen or fail to improve.    Donato Schultz, DO

## 2023-03-02 NOTE — Patient Instructions (Signed)
Abdominal Pain, Adult  Pain in the abdomen (abdominal pain) can be caused by many things. In most cases, it gets better with no treatment or by being treated at home. But in some cases, it can be serious. Your health care provider will ask questions about your medical history and do a physical exam to try to figure out what is causing your pain. Follow these instructions at home: Medicines Take over-the-counter and prescription medicines only as told by your provider. Do not take medicines that help you poop (laxatives) unless told by your provider. General instructions Watch your condition for any changes. Drink enough fluid to keep your pee (urine) pale yellow. Contact a health care provider if: Your pain changes, gets worse, or lasts longer than expected. You have severe cramping or bloating in your abdomen, or you vomit. Your pain gets worse with meals, after eating, or with certain foods. You are constipated or have diarrhea for more than 2-3 days. You are not hungry, or you lose weight without trying. You have signs of dehydration. These may include: Dark pee, very little pee, or no pee. Cracked lips or dry mouth. Sleepiness or weakness. You have pain when you pee (urinate) or poop. Your abdominal pain wakes you up at night. You have blood in your pee. You have a fever. Get help right away if: You cannot stop vomiting. Your pain is only in one part of the abdomen. Pain on the right side could be caused by appendicitis. You have bloody or black poop (stool), or poop that looks like tar. You have trouble breathing. You have chest pain. These symptoms may be an emergency. Get help right away. Call 911. Do not wait to see if the symptoms will go away. Do not drive yourself to the hospital. This information is not intended to replace advice given to you by your health care provider. Make sure you discuss any questions you have with your health care provider. Document Revised:  12/18/2021 Document Reviewed: 12/18/2021 Elsevier Patient Education  2024 Elsevier Inc.  

## 2023-03-03 ENCOUNTER — Encounter: Payer: Self-pay | Admitting: Family Medicine

## 2023-03-03 LAB — IRON,TIBC AND FERRITIN PANEL
%SAT: 18 % (ref 16–45)
Ferritin: 43 ng/mL (ref 16–232)
Iron: 50 ug/dL (ref 45–160)
TIBC: 271 ug/dL (ref 250–450)

## 2023-03-03 LAB — INSULIN, RANDOM: Insulin: 9 u[IU]/mL

## 2023-03-03 NOTE — Telephone Encounter (Signed)
One scheduled for tomorrow and the other is on 12/20

## 2023-03-04 ENCOUNTER — Encounter (HOSPITAL_BASED_OUTPATIENT_CLINIC_OR_DEPARTMENT_OTHER): Payer: Self-pay

## 2023-03-04 ENCOUNTER — Ambulatory Visit (HOSPITAL_BASED_OUTPATIENT_CLINIC_OR_DEPARTMENT_OTHER)
Admission: RE | Admit: 2023-03-04 | Discharge: 2023-03-04 | Disposition: A | Discharge status: Home / Self Care | Payer: BC Managed Care – PPO | Source: Ambulatory Visit | Attending: Family Medicine | Admitting: Family Medicine

## 2023-03-04 DIAGNOSIS — R1032 Left lower quadrant pain: Secondary | ICD-10-CM | POA: Diagnosis present

## 2023-03-06 ENCOUNTER — Ambulatory Visit (HOSPITAL_BASED_OUTPATIENT_CLINIC_OR_DEPARTMENT_OTHER)
Admission: RE | Admit: 2023-03-06 | Discharge: 2023-03-06 | Disposition: A | Discharge status: Home / Self Care | Payer: BC Managed Care – PPO | Source: Ambulatory Visit | Attending: Family Medicine | Admitting: Family Medicine

## 2023-03-06 DIAGNOSIS — R1032 Left lower quadrant pain: Secondary | ICD-10-CM | POA: Insufficient documentation

## 2023-03-12 ENCOUNTER — Other Ambulatory Visit (HOSPITAL_BASED_OUTPATIENT_CLINIC_OR_DEPARTMENT_OTHER): Payer: Self-pay

## 2023-03-12 ENCOUNTER — Encounter: Payer: Self-pay | Admitting: Family Medicine

## 2023-03-12 ENCOUNTER — Ambulatory Visit: Payer: BC Managed Care – PPO | Admitting: Family Medicine

## 2023-03-12 VITALS — BP 108/64 | HR 83 | Temp 97.8°F | Resp 16 | Ht 59.0 in | Wt 227.0 lb

## 2023-03-12 DIAGNOSIS — R350 Frequency of micturition: Secondary | ICD-10-CM

## 2023-03-12 DIAGNOSIS — J4521 Mild intermittent asthma with (acute) exacerbation: Secondary | ICD-10-CM | POA: Insufficient documentation

## 2023-03-12 DIAGNOSIS — N83299 Other ovarian cyst, unspecified side: Secondary | ICD-10-CM | POA: Diagnosis not present

## 2023-03-12 DIAGNOSIS — R1011 Right upper quadrant pain: Secondary | ICD-10-CM | POA: Insufficient documentation

## 2023-03-12 LAB — CBC WITH DIFFERENTIAL/PLATELET
Basophils Absolute: 0.1 10*3/uL (ref 0.0–0.1)
Basophils Relative: 0.9 % (ref 0.0–3.0)
Eosinophils Absolute: 0.5 10*3/uL (ref 0.0–0.7)
Eosinophils Relative: 4.5 % (ref 0.0–5.0)
HCT: 42.4 % (ref 36.0–46.0)
Hemoglobin: 14 g/dL (ref 12.0–15.0)
Lymphocytes Relative: 22.8 % (ref 12.0–46.0)
Lymphs Abs: 2.4 10*3/uL (ref 0.7–4.0)
MCHC: 33.1 g/dL (ref 30.0–36.0)
MCV: 87 fL (ref 78.0–100.0)
Monocytes Absolute: 0.7 10*3/uL (ref 0.1–1.0)
Monocytes Relative: 6.3 % (ref 3.0–12.0)
Neutro Abs: 6.9 10*3/uL (ref 1.4–7.7)
Neutrophils Relative %: 65.5 % (ref 43.0–77.0)
Platelets: 304 10*3/uL (ref 150.0–400.0)
RBC: 4.87 Mil/uL (ref 3.87–5.11)
RDW: 15.1 % (ref 11.5–15.5)
WBC: 10.6 10*3/uL — ABNORMAL HIGH (ref 4.0–10.5)

## 2023-03-12 LAB — POC URINALSYSI DIPSTICK (AUTOMATED)
Bilirubin, UA: NEGATIVE
Blood, UA: NEGATIVE
Glucose, UA: NEGATIVE
Ketones, UA: NEGATIVE
Leukocytes, UA: NEGATIVE
Nitrite, UA: NEGATIVE
Protein, UA: NEGATIVE
Spec Grav, UA: 1.01 (ref 1.010–1.025)
Urobilinogen, UA: 0.2 U/dL
pH, UA: 5 (ref 5.0–8.0)

## 2023-03-12 LAB — COMPREHENSIVE METABOLIC PANEL
ALT: 27 U/L (ref 0–35)
AST: 21 U/L (ref 0–37)
Albumin: 4.2 g/dL (ref 3.5–5.2)
Alkaline Phosphatase: 86 U/L (ref 39–117)
BUN: 12 mg/dL (ref 6–23)
CO2: 29 meq/L (ref 19–32)
Calcium: 9.1 mg/dL (ref 8.4–10.5)
Chloride: 104 meq/L (ref 96–112)
Creatinine, Ser: 0.77 mg/dL (ref 0.40–1.20)
GFR: 84.86 mL/min (ref >60.00)
Glucose, Bld: 93 mg/dL (ref 70–99)
Potassium: 3.9 meq/L (ref 3.5–5.1)
Sodium: 143 meq/L (ref 135–145)
Total Bilirubin: 0.3 mg/dL (ref 0.2–1.2)
Total Protein: 6.9 g/dL (ref 6.0–8.3)

## 2023-03-12 MED ORDER — HYDROCODONE-ACETAMINOPHEN 5-325 MG PO TABS
1.0000 | ORAL_TABLET | Freq: Four times a day (QID) | ORAL | 0 refills | Status: DC | PRN
  Filled 2023-03-12: qty 20, 5d supply, fill #0

## 2023-03-12 MED ORDER — PREDNISONE 50 MG PO TABS
50.0000 mg | ORAL_TABLET | ORAL | 0 refills | Status: DC
  Filled 2023-03-12: qty 3, 1d supply, fill #0

## 2023-03-12 MED ORDER — PREDNISONE 20 MG PO TABS: 40.0000 mg | ORAL_TABLET | Freq: Every day | ORAL | 0 refills | Status: DC

## 2023-03-12 NOTE — Progress Notes (Signed)
Established Patient Office Visit  Subjective   Patient ID: Deborah Watts, female    DOB: 1966-01-28  Age: 57 y.o. MRN: [id number]  Chief Complaint  Patient presents with   Back Pain    Complains of low back pain radiating to suprapubic area.    HPI Discussed the use of AI scribe software for clinical note transcription with the patient, who gave verbal consent to proceed.  History of Present Illness   The patient presents with acute urinary retention that started yesterday. She describes the sensation as if her bladder is "backed up," and she is unable to urinate normally. The patient reports experiencing a burning sensation during urination at times. She also reports abdominal pain that radiates from the upper abdomen to the lower abdomen and around the back. The patient has been trying to increase fluid intake but reports that water has been causing nausea.  The patient has a known complex cyst on her ovary, for which an MRI has been recommended. She has a history of hysterectomy, with only the left ovary and fallopian tube remaining. The patient does not report any issues with claustrophobia and typically takes Xanax prior to undergoing MRI scans.  The patient also reports increased liver enzymes, the cause of which is yet to be determined. She denies taking any substances that could potentially affect liver function. The patient is scheduled for a CT scan with contrast, which will be performed at the hospital due to a known allergy to contrast dye. The patient will be premedicated with prednisone and Benadryl prior to the scan.      Patient Active Problem List   Diagnosis Date Noted   Right upper quadrant abdominal pain 03/12/2023   Complex ovarian cyst 03/12/2023   Mild intermittent asthma with acute exacerbation 03/12/2023   Left lower quadrant abdominal pain 03/02/2023   Urinary frequency 03/02/2023   Iron deficiency anemia 03/02/2023   Pulmonary nodules 02/20/2023   Suspected  sleep apnea 02/20/2023   Allergic rhinitis 02/20/2023   Pain of left calf 08/12/2022   Cellulitis of left lower extremity 08/12/2022   Edema, lower extremity 07/21/2022   SOB (shortness of breath) 07/21/2022   Hyperlipidemia 07/21/2022   Major depressive disorder, recurrent episode, moderate (HCC) 11/25/2021   Generalized anxiety disorder 11/25/2021   Depression with anxiety 10/28/2021   Asthma, chronic, unspecified asthma severity, uncomplicated 05/02/2021   Loud snoring 11/28/2019   Daytime sleepiness 11/28/2019   Other fatigue 11/28/2019   Primary osteoarthritis of left knee 08/02/2019   Rib pain 10/19/2018   Right knee pain 04/12/2017   Preventative health care 03/25/2016   Left ankle pain 01/14/2016   Left leg pain 04/28/2014   Pain of right calf 04/20/2014   Obesity (BMI 30-39.9) 01/06/2013   Thrush, oral 03/04/2011   TINEA CRURIS 02/05/2010   HYPOKALEMIA 04/26/2009   Primary hypertension 02/08/2008   LOW BACK PAIN SYNDROME 05/26/2007   LEG CRAMPS 05/14/2007   Edema 05/14/2007   Chronic migraine without aura, with status migrainosus 05/01/2007   ASTHMATIC BRONCHITIS, ACUTE 10/15/2006   Anxiety state 04/20/2006   RESTLESS LEG SYNDROME, HX OF 04/20/2006   Past Medical History:  Diagnosis Date   Anxiety    Arthritis    Asthma    Headache    migraines   History of hiatal hernia    repaired 30 yrs ago in [place]   Hypertension    MHA (microangiopathic hemolytic anemia) (HCC)    Past Surgical History:  Procedure Laterality  Date   APPENDECTOMY N/A    BACK SURGERY     LAPAROSCOPIC CHOLECYSTECTOMY N/A 03/1992   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   RE[facility]ACEMENT TOTAL KNEE Right 01/15/2021   [facility]   TOTAL ABDOMINAL HYSTERECTOMY Right    TUBAL LIGATION     WRIST ARTHROSCOPY Right 02/12/2018   Procedure: Right wrist arthroscopy, evaluation under anesthesia with debridement and repair as necessary and right carpal tunnel release;  Surgeon: Dominica Severin, MD;  Location: [facility] OR;  Service: Orthopedics;  Laterality: Right;  90 mins   Social History   Tobacco Use   Smoking status: Never   Smokeless tobacco: Never  Vaping Use   Vaping status: Never Used  Substance Use Topics   Alcohol use: Not Currently    Comment: rare   Drug use: No   Social History   Socioeconomic History   Marital status: Married    Spouse name: Not on file   Number of children: Not on file   Years of education: Not on file   Highest education level: Some college, no degree  Occupational History   Occupation: Administrator, 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[employer] [place]  Tobacco Use   Smoking status: Never   Smokeless tobacco: Never  Vaping Use   Vaping status: Never Used  Substance and Sexual Activity   Alcohol use: Not Currently    Comment: rare   Drug use: No   Sexual activity: Not on file  Other Topics Concern   Not on file  Social History Narrative   Exercise-- no   Social Drivers of Health   Financial Resource Strain: Low Risk  (03/02/2023)   Overall Financial Resource Strain (CARDIA)    Difficulty of Paying Living Expenses: Not very hard  Food Insecurity: No Food Insecurity (03/02/2023)   Hunger Vital Sign    Worried About Running Out of Food in the Last Year: Never true    Ran Out of Food in the Last Year: Never true  Transportation Needs: No Transportation Needs (03/02/2023)   PRAPARE - Administrator, Civil Service (Medical): No    Lack of Transportation (Non-Medical): No  Physical Activity: Inactive (03/02/2023)   Exercise Vital Sign    Days of Exercise per Week: 0 days    Minutes of Exercise per Session: 10 min  Stress: No Stress Concern Present (03/02/2023)   [employer] of Occupational Health - Occupational Stress Questionnaire    Feeling of Stress : Only a little  Social Connections: Socially Integrated (03/02/2023)   Social Connection and Isolation Panel [NHANES]    Frequency of Communication with Friends and Family:  More than three times a week    Frequency of Social Gatherings with Friends and Family: Once a week    Attends Religious Services: More than 4 times per year    Active Member of [employer] or Organizations: Yes    Attends Banker Meetings: 1 to 4 times per year    Marital Status: Married  Catering manager Violence: Not At Risk (03/19/2022)   Received from [employer], [facility]   HITS    Over the last 12 months how often did your partner physically hurt you?: Never    Over the last 12 months how often did your partner insult you or talk down to you?: Never    Over the last 12 months how often did your partner threaten you with physical harm?: Never    Over the last 12 months  how often did your partner scream or curse at you?: Never   Family Status  Relation Name Status   Mother  Alive   Father  Deceased at age 42   Other  (Not Specified)  No partnership data on file   Family History  Problem Relation Age of Onset   Throat cancer Father    Kidney failure Father    Asthma Other    Diabetes Other    Hyperlipidemia Other    Allergies  Allergen Reactions   Fish Allergy Hives and Swelling   Penicillins Anaphylaxis, Hives and Other (See Comments)    Has patient had a PCN reaction causing immediate rash, facial/tongue/throat swelling, SOB or lightheadedness with hypotension: Unknown Has patient had a PCN reaction causing severe rash involving mucus membranes or skin necrosis: No Has patient had a PCN reaction that required hospitalization: Yes Has patient had a PCN reaction occurring within the last 10 years: No If all of the above answers are "NO", then may proceed with Cephalosporin use.    Tizanidine Hcl Nausea And Vomiting, Rash and Shortness Of Breath   Iohexol Other (See Comments)     Desc: hives, sob, pt. needs 13 hr prep   01/16/05    Ivp Dye [Iodinated Contrast Media] Hives and Swelling   Morphine And Codeine Nausea And Vomiting      Review of Systems   Constitutional:  Negative for fever and malaise/fatigue.  HENT:  Negative for congestion.   Eyes:  Negative for blurred vision.  Respiratory:  Negative for shortness of breath.   Cardiovascular:  Negative for chest pain, palpitations and leg swelling.  Gastrointestinal:  Positive for abdominal pain. Negative for blood in stool and nausea.  Genitourinary:  Positive for dysuria and flank pain. Negative for frequency.  Musculoskeletal:  Negative for falls.  Skin:  Negative for rash.  Neurological:  Negative for dizziness, loss of consciousness and headaches.  Endo/Heme/Allergies:  Negative for environmental allergies.  Psychiatric/Behavioral:  Negative for depression. The patient is not nervous/anxious.       Objective:     BP 108/64 (BP Location: Left Arm, Patient Position: Sitting, Cuff Size: Large)   Pulse 83   Temp 97.8 F (36.6 C) (Oral)   Resp 16   Ht 4\' 11"  (1.499 m)   Wt 227 lb (103 kg)   SpO2 96%   BMI 45.85 kg/m  BP Readings from Last 3 Encounters:  03/12/23 108/64  03/02/23 (!) 140/90  02/20/23 116/84   Wt Readings from Last 3 Encounters:  03/12/23 227 lb (103 kg)  03/02/23 227 lb (103 kg)  02/20/23 228 lb (103.4 kg)   SpO2 Readings from Last 3 Encounters:  03/12/23 96%  03/02/23 98%  02/20/23 97%      Physical Exam Vitals and nursing note reviewed.  Constitutional:      General: She is not in acute distress.    Appearance: Normal appearance. She is well-developed.  HENT:     Head: Normocephalic and atraumatic.  Eyes:     General: No scleral icterus.       Right eye: No discharge.        Left eye: No discharge.  Cardiovascular:     Rate and Rhythm: Normal rate and regular rhythm.     Heart sounds: No murmur heard. Pulmonary:     Effort: Pulmonary effort is normal. No respiratory distress.     Breath sounds: Normal breath sounds.  Abdominal:     General: There is no  distension.     Tenderness: There is abdominal tenderness in the right upper  quadrant and suprapubic area. There is no guarding or rebound.    Musculoskeletal:        General: Normal range of motion.     Cervical back: Normal range of motion and neck supple.     Right lower leg: No edema.     Left lower leg: No edema.  Skin:    General: Skin is warm and dry.  Neurological:     Mental Status: She is alert and oriented to person, place, and time.  Psychiatric:        Mood and Affect: Mood normal.        Behavior: Behavior normal.        Thought Content: Thought content normal.        Judgment: Judgment normal.      Results for orders placed or performed in visit on 03/12/23  Comprehensive metabolic panel  Result Value Ref Range   Sodium 143 135 - 145 mEq/L   Potassium 3.9 3.5 - 5.1 mEq/L   Chloride 104 96 - 112 mEq/L   CO2 29 19 - 32 mEq/L   Glucose, Bld 93 70 - 99 mg/dL   BUN 12 6 - 23 mg/dL   Creatinine, Ser 1.61 0.40 - 1.20 mg/dL   Total Bilirubin 0.3 0.2 - 1.2 mg/dL   Alkaline Phosphatase 86 39 - 117 U/L   AST 21 0 - 37 U/L   ALT 27 0 - 35 U/L   Total Protein 6.9 6.0 - 8.3 g/dL   Albumin 4.2 3.5 - 5.2 g/dL   GFR 09.60 >45.40 mL/min   Calcium 9.1 8.4 - 10.5 mg/dL  POCT Urinalysis Dipstick (Automated)  Result Value Ref Range   Color, UA yellow    Clarity, UA clear    Glucose, UA Negative Negative   Bilirubin, UA Negative    Ketones, UA Negative    Spec Grav, UA 1.010 1.010 - 1.025   Blood, UA Negative    pH, UA 5.0 5.0 - 8.0   Protein, UA Negative Negative   Urobilinogen, UA 0.2 0.2 or 1.0 E.U./dL   Nitrite, UA Negative    Leukocytes, UA Negative Negative    Last CBC Lab Results  Component Value Date   WBC 9.8 03/02/2023   HGB 14.6 03/02/2023   HCT 44.0 03/02/2023   MCV 87.0 03/02/2023   MCH 28.3 08/29/2021   RDW 15.7 (H) 03/02/2023   PLT 295.0 03/02/2023   Last metabolic panel Lab Results  Component Value Date   GLUCOSE 93 03/12/2023   NA 143 03/12/2023   K 3.9 03/12/2023   CL 104 03/12/2023   CO2 29 03/12/2023   BUN  12 03/12/2023   CREATININE 0.77 03/12/2023   GFR 84.86 03/12/2023   CALCIUM 9.1 03/12/2023   PROT 6.9 03/12/2023   ALBUMIN 4.2 03/12/2023   BILITOT 0.3 03/12/2023   ALKPHOS 86 03/12/2023   AST 21 03/12/2023   ALT 27 03/12/2023   ANIONGAP 7 08/29/2021   Last lipids Lab Results  Component Value Date   CHOL 158 03/02/2023   HDL 33.20 (L) 03/02/2023   LDLCALC 86 03/02/2023   LDLDIRECT 100.0 07/21/2022   TRIG 193.0 (H) 03/02/2023   CHOLHDL 5 03/02/2023   Last hemoglobin A1c Lab Results  Component Value Date   HGBA1C 6.6 (H) 07/28/2022   Last thyroid functions Lab Results  Component Value Date   TSH 1.26 03/02/2023  Last vitamin D No results found for: "25OHVITD2", "25OHVITD3", "VD25OH" Last vitamin B12 and Folate Lab Results  Component Value Date   VITAMINB12 383 12/01/2022   FOLATE 9.7 12/01/2022      The 10-year ASCVD risk score (Arnett DK, et al., 2019) is: 3.1%    Assessment & Plan:   Problem List Items Addressed This Visit       Unprioritized   Urinary frequency   Relevant Medications   HYDROcodone-acetaminophen (NORCO/VICODIN) 5-325 MG tablet   Other Relevant Orders   Urine Culture   Right upper quadrant abdominal pain   Check CT abd ? Cirrhosis on [place] And complex ovarian cyst  Check labs  Go to ER if pain worsens       Relevant Medications   predniSONE (DELTASONE) 50 MG tablet   Other Relevant Orders   CT ABDOMEN PELVIS W CONTRAST   POCT Urinalysis Dipstick (Automated) (Completed)   CBC with Differential/Platelet   Comprehensive metabolic panel (Completed)   Mild intermittent asthma with acute exacerbation   Relevant Medications   predniSONE (DELTASONE) 20 MG tablet   predniSONE (DELTASONE) 50 MG tablet   Complex ovarian cyst - Primary   Relevant Orders   Ambulatory referral to Obstetrics / Gynecology   MR PELVIS W WO CONTRAST  Assessment and Plan    Urinary Retention   She presented with acute onset of urinary retention since  yesterday, experiencing difficulty urinating, a burning sensation, and lower abdominal pain. Drinking water induces nausea. We discussed the necessity of a CT scan with contrast, premedicating with liquid prednisone and Benadryl due to allergy concerns. We will order a CT scan with contrast, administer liquid prednisone, and advise taking Benadryl one hour before the scan. A cup for a urine sample will be provided, and blood work will be performed to evaluate liver and kidney function. Catheterization will be considered if she remains unable to urinate.  Ovarian Cyst   A complex ovarian cyst was identified on imaging, accompanied by lower abdominal pain. She has no gynecological follow-up history and a history of hysterectomy, with only the left ovary and fallopian tube remaining. We discussed the need for an MRI to rule out malignancy and referred her to an OB/GYN surgeon for potential surgical intervention, with a preference for follow-up with [facility]. An MRI will be ordered for further evaluation of the ovarian cyst, and the possibility of malignancy and the need for further evaluation were discussed.   Follow-up   A follow-up appointment will be scheduled after the CT and MRI results are received. Necessary paperwork for work leave through [place] will be completed.        Return if symptoms worsen or fail to improve, for pt is to go to ER if pain worsens or pain med does not work.    Donato Schultz, DO

## 2023-03-12 NOTE — Patient Instructions (Signed)
Abdominal Pain, Adult  Pain in the abdomen (abdominal pain) can be caused by many things. In most cases, it gets better with no treatment or by being treated at home. But in some cases, it can be serious. Your health care provider will ask questions about your medical history and do a physical exam to try to figure out what is causing your pain. Follow these instructions at home: Medicines Take over-the-counter and prescription medicines only as told by your provider. Do not take medicines that help you poop (laxatives) unless told by your provider. General instructions Watch your condition for any changes. Drink enough fluid to keep your pee (urine) pale yellow. Contact a health care provider if: Your pain changes, gets worse, or lasts longer than expected. You have severe cramping or bloating in your abdomen, or you vomit. Your pain gets worse with meals, after eating, or with certain foods. You are constipated or have diarrhea for more than 2-3 days. You are not hungry, or you lose weight without trying. You have signs of dehydration. These may include: Dark pee, very little pee, or no pee. Cracked lips or dry mouth. Sleepiness or weakness. You have pain when you pee (urinate) or poop. Your abdominal pain wakes you up at night. You have blood in your pee. You have a fever. Get help right away if: You cannot stop vomiting. Your pain is only in one part of the abdomen. Pain on the right side could be caused by appendicitis. You have bloody or black poop (stool), or poop that looks like tar. You have trouble breathing. You have chest pain. These symptoms may be an emergency. Get help right away. Call 911. Do not wait to see if the symptoms will go away. Do not drive yourself to the hospital. This information is not intended to replace advice given to you by your health care provider. Make sure you discuss any questions you have with your health care provider. Document Revised:  12/18/2021 Document Reviewed: 12/18/2021 Elsevier Patient Education  2024 Elsevier Inc.  

## 2023-03-12 NOTE — Assessment & Plan Note (Signed)
Check CT abd ? Cirrhosis on [place] And complex ovarian cyst  Check labs  Go to ER if pain worsens

## 2023-03-13 ENCOUNTER — Encounter (HOSPITAL_BASED_OUTPATIENT_CLINIC_OR_DEPARTMENT_OTHER): Payer: Self-pay

## 2023-03-13 ENCOUNTER — Ambulatory Visit (HOSPITAL_BASED_OUTPATIENT_CLINIC_OR_DEPARTMENT_OTHER)
Admission: RE | Admit: 2023-03-13 | Discharge: 2023-03-13 | Disposition: A | Discharge status: Home / Self Care | Payer: BC Managed Care – PPO | Source: Ambulatory Visit | Attending: Family Medicine | Admitting: Family Medicine

## 2023-03-13 ENCOUNTER — Encounter: Payer: Self-pay | Admitting: Family Medicine

## 2023-03-13 ENCOUNTER — Other Ambulatory Visit: Payer: Self-pay

## 2023-03-13 ENCOUNTER — Other Ambulatory Visit: Payer: Self-pay | Admitting: Family Medicine

## 2023-03-13 DIAGNOSIS — R1011 Right upper quadrant pain: Secondary | ICD-10-CM | POA: Diagnosis present

## 2023-03-13 DIAGNOSIS — N83299 Other ovarian cyst, unspecified side: Secondary | ICD-10-CM

## 2023-03-13 DIAGNOSIS — K760 Fatty (change of) liver, not elsewhere classified: Secondary | ICD-10-CM

## 2023-03-13 DIAGNOSIS — R195 Other fecal abnormalities: Secondary | ICD-10-CM

## 2023-03-13 LAB — URINE CULTURE
MICRO NUMBER:: 15890545
Result:: NO GROWTH
SPECIMEN QUALITY:: ADEQUATE

## 2023-03-13 MED ORDER — IOHEXOL 300 MG/ML  SOLN
100.0000 mL | Freq: Once | INTRAMUSCULAR | Status: AC | PRN
  Administered 2023-03-13: 100 mL via INTRAVENOUS

## 2023-03-13 NOTE — Telephone Encounter (Signed)
Do you have recommendation where we could send it?

## 2023-03-13 NOTE — Telephone Encounter (Signed)
New order placed. Epic wouldn't let me change locations

## 2023-03-13 NOTE — Addendum Note (Signed)
Addended by: Roxanne Gates on: 03/13/2023 03:14 PM   Modules accepted: Orders

## 2023-03-16 ENCOUNTER — Other Ambulatory Visit (INDEPENDENT_AMBULATORY_CARE_PROVIDER_SITE_OTHER): Payer: BC Managed Care – PPO

## 2023-03-16 ENCOUNTER — Telehealth: Payer: Self-pay | Admitting: Family Medicine

## 2023-03-16 DIAGNOSIS — N83299 Other ovarian cyst, unspecified side: Secondary | ICD-10-CM

## 2023-03-16 NOTE — Telephone Encounter (Signed)
Pt dropped off paperwork to be completed by PCP. Please fax when complete to either of the numbers at the top of the form. Pt asks to be notified when the form has been faxed.

## 2023-03-16 NOTE — Progress Notes (Signed)
Patient presents for lab. Patient was sent to the lab to have lab drawn. Mirinda Monte l Brighid Koch, CMA

## 2023-03-17 LAB — OVARIAN MALIGNANCY RISK-ROMA
Cancer Antigen (CA) 125: 6.8 U/mL (ref 0.0–38.1)
HE4: 83.6 pmol/L (ref 0.0–105.2)
Postmenopausal ROMA: 1.11
Premenopausal ROMA: 2.07 — ABNORMAL HIGH

## 2023-03-17 LAB — POSTMENOPAUSAL INTERP: LOW

## 2023-03-17 LAB — PREMENOPAUSAL INTERP: HIGH

## 2023-03-17 NOTE — Telephone Encounter (Signed)
Received

## 2023-03-18 HISTORY — PX: COLONOSCOPY WITH ESOPHAGOGASTRODUODENOSCOPY (EGD) AND ESOPHAGEAL DILATION (ED): SHX6495

## 2023-03-20 ENCOUNTER — Encounter: Payer: Self-pay | Admitting: Family Medicine

## 2023-03-25 ENCOUNTER — Other Ambulatory Visit (INDEPENDENT_AMBULATORY_CARE_PROVIDER_SITE_OTHER): Payer: BC Managed Care – PPO

## 2023-03-25 ENCOUNTER — Ambulatory Visit (HOSPITAL_BASED_OUTPATIENT_CLINIC_OR_DEPARTMENT_OTHER)
Admission: RE | Admit: 2023-03-25 | Discharge: 2023-03-25 | Disposition: A | Discharge status: Home / Self Care | Payer: BC Managed Care – PPO | Source: Ambulatory Visit | Attending: Family Medicine | Admitting: Family Medicine

## 2023-03-25 DIAGNOSIS — N83299 Other ovarian cyst, unspecified side: Secondary | ICD-10-CM | POA: Diagnosis present

## 2023-03-25 DIAGNOSIS — D509 Iron deficiency anemia, unspecified: Secondary | ICD-10-CM | POA: Diagnosis not present

## 2023-03-25 MED ORDER — GADOBUTROL 1 MMOL/ML IV SOLN
10.0000 mL | Freq: Once | INTRAVENOUS | Status: AC | PRN
  Administered 2023-03-25: 10 mL via INTRAVENOUS

## 2023-03-27 ENCOUNTER — Other Ambulatory Visit: Payer: Self-pay | Admitting: Family Medicine

## 2023-03-27 DIAGNOSIS — R350 Frequency of micturition: Secondary | ICD-10-CM

## 2023-03-27 DIAGNOSIS — R109 Unspecified abdominal pain: Secondary | ICD-10-CM

## 2023-03-27 LAB — FECAL OCCULT BLOOD, IMMUNOCHEMICAL: Fecal Occult Bld: POSITIVE — AB

## 2023-03-27 MED ORDER — HYDROCODONE-ACETAMINOPHEN 5-325 MG PO TABS: 1.0000 | ORAL_TABLET | Freq: Four times a day (QID) | ORAL | 0 refills | Status: AC | PRN

## 2023-03-27 NOTE — Telephone Encounter (Signed)
 Requesting: tramadol 50mg  and hydrocodone Contract: 05/21/21 UDS: 05/02/21 Last Visit: 03/02/23 Next Visit: 05/08/23 Last Refill: see med list  Please Advise

## 2023-03-30 ENCOUNTER — Other Ambulatory Visit: Payer: BC Managed Care – PPO

## 2023-03-30 ENCOUNTER — Encounter: Payer: Self-pay | Admitting: Family Medicine

## 2023-03-31 ENCOUNTER — Telehealth: Payer: Self-pay | Admitting: Family Medicine

## 2023-03-31 NOTE — Telephone Encounter (Signed)
 Pt brought back paperwork to be corrected by pcp. Pt needs this back by tomorrow. Pt marked where the papers were not completed. Pt also needs office visit notes and records from the appointment on the 26th. Please call pt when fixed.

## 2023-03-31 NOTE — Telephone Encounter (Signed)
 Called imaging and they stated that they will get the MRI moved up to get [place] the results.

## 2023-04-01 NOTE — Telephone Encounter (Signed)
 Forms corrected and faxed. Pt made aware.

## 2023-04-02 ENCOUNTER — Encounter: Payer: Self-pay | Admitting: Family Medicine

## 2023-04-04 ENCOUNTER — Other Ambulatory Visit: Payer: Self-pay | Admitting: Family Medicine

## 2023-04-04 DIAGNOSIS — R600 Localized edema: Secondary | ICD-10-CM

## 2023-04-06 ENCOUNTER — Encounter: Payer: Self-pay | Admitting: Obstetrics & Gynecology

## 2023-04-06 ENCOUNTER — Ambulatory Visit: Payer: BC Managed Care – PPO | Admitting: Obstetrics & Gynecology

## 2023-04-06 VITALS — BP 147/83 | HR 100 | Ht 59.0 in | Wt 232.0 lb

## 2023-04-06 DIAGNOSIS — N83299 Other ovarian cyst, unspecified side: Secondary | ICD-10-CM

## 2023-04-06 NOTE — Progress Notes (Signed)
GYNECOLOGY OFFICE VISIT NOTE  History:   Deborah Watts is a 58 y.o. [id number] s/p hysterectomy, RSO many years ago for benign indications here today for discussion about evaluation of small left ovarian cyst seen on imaging done in December after evaluation of abdominal pain.  Patient reports intermittent left lower quadrant pain, has not been able to work since end of December due to this pain.  She is accompanied by her daughter.  She denies any current abnormal vaginal discharge, bleeding, pelvic pain or other concerns.    Past Medical History:  Diagnosis Date   Anxiety    Arthritis    Asthma    Headache    migraines   History of hiatal hernia    repaired 30 yrs ago in [place]   Hypertension    MHA (microangiopathic hemolytic anemia) (HCC)     Past Surgical History:  Procedure Laterality Date   APPENDECTOMY N/A    BACK SURGERY     LAPAROSCOPIC CHOLECYSTECTOMY N/A 03/1992   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   [facility]   REPLACEMENT TOTAL KNEE Right 01/15/2021   [facility]   TOTAL ABDOMINAL HYSTERECTOMY Right    TUBAL LIGATION     WRIST ARTHROSCOPY Right 02/12/2018   Procedure: Right wrist arthroscopy, evaluation under anesthesia with debridement and repair as necessary and right carpal tunnel release;  Surgeon: Dominica Severin, MD;  Location: [facility] OR;  Service: Orthopedics;  Laterality: Right;  90 mins    The following portions of the patient's history were reviewed and updated as appropriate: allergies, current medications, past family history, past medical history, past social history, past surgical history and problem list.   Health Maintenance:  Normal mammogram on 05/26/2022.   Review of Systems:  Pertinent items noted in HPI and remainder of comprehensive ROS otherwise negative.  Physical Exam:  BP (!) 147/83   Pulse 100   Ht 4\' 11"  (1.499 m)   Wt 232 lb (105.2 kg)   BMI 46.86 kg/m  CONSTITUTIONAL: Well-developed, well-nourished female in no acute distress.   HEENT:  Normocephalic, atraumatic. External right and left ear normal. No scleral icterus.  NECK: Normal range of motion, supple, no masses noted on observation SKIN: No rash noted. Not diaphoretic. No erythema. No pallor. MUSCULOSKELETAL: Normal range of motion. No edema noted. NEUROLOGIC: Alert and oriented to person, place, and time. Normal muscle tone coordination. No cranial nerve deficit noted. PSYCHIATRIC: Normal mood and affect. Normal behavior. Normal judgment and thought content. CARDIOVASCULAR: Normal heart rate noted RESPIRATORY: Effort and breath sounds normal, no problems with respiration noted ABDOMEN: No masses noted. No other overt distention noted.   No tenderness currently. PELVIC: Deferred   Physical Exam Abdominal:       Comments: Location of pain when it occurs, can radiate downwards at times    Labs and Imaging  Latest Reference Range & Units 03/16/23 08:23  Cancer Antigen (CA) 125 0.0 - 38.1 U/mL 6.8  HE4 0.0 - 105.2 pmol/L 83.6  Postmenopausal ROMA See below  1.11  Postmenopausal Interp: LOW  Comment   MR Pelvis W Wo Contrast Result Date: 04/01/2023 CLINICAL DATA:  Left ovarian cysts on ultrasound EXAM: MRI PELVIS WITHOUT AND WITH CONTRAST TECHNIQUE: Multiplanar multisequence MR imaging of the pelvis was performed both before and after administration of intravenous contrast. CONTRAST:  10mL GADAVIST GADOBUTROL 1 MMOL/ML IV SOLN COMPARISON:  CT abdomen/pelvis dated 03/13/2023. Pelvic ultrasound dated 03/04/2023. FINDINGS: Urinary Tract:  Bladder is within normal limits. Bowel: Visualized bowel is  unremarkable, noting mild sigmoid diverticulosis. Vascular/Lymphatic: No evidence of aneurysm. No suspicious pelvic lymphadenopathy. Reproductive:  Status post hysterectomy. Suspected right salpingo oophorectomy. Left ovary is notable for clustered small ovarian cysts measuring up to 13 mm (series 5/image 23), within normal limits. No follow-up is recommended. Other:  No  pelvic ascites. Musculoskeletal: Mild degenerative changes of the lower lumbar spine. IMPRESSION: Small left ovarian cysts measuring up to 13 mm, within normal limits. No follow-up is recommended. Status post hysterectomy. Suspected right salpingo oophorectomy. Electronically Signed   By: Charline Bills M.D.   On: 04/01/2023 21:31   CT ABDOMEN PELVIS W CONTRAST Result Date: 03/13/2023 CLINICAL DATA:  Right upper quadrant pain. Right flank pain. Prior cholecystectomy. EXAM: CT ABDOMEN AND PELVIS WITH CONTRAST TECHNIQUE: Multidetector CT imaging of the abdomen and pelvis was performed using the standard protocol following bolus administration of intravenous contrast. RADIATION DOSE REDUCTION: This exam was performed according to the departmental dose-optimization program which includes automated exposure control, adjustment of the mA and/or kV according to patient size and/or use of iterative reconstruction technique. CONTRAST:  OMNIPAQUE IOHEXOL 300 MG/ML  SOLN COMPARISON:  09/21/2020 FINDINGS: Lower Chest: No acute findings. Hepatobiliary: Moderate diffuse hepatic steatosis and hepatomegaly again noted. No suspicious hepatic masses identified. Prior cholecystectomy. No evidence of biliary obstruction. Pancreas:  No mass or inflammatory changes. Spleen: Within normal limits in size and appearance. Adrenals/Urinary Tract: No suspicious masses identified. No evidence of ureteral calculi or hydronephrosis. Stomach/Bowel: No evidence of obstruction, inflammatory process or abnormal fluid collections. Vascular/Lymphatic: No pathologically enlarged lymph nodes. No acute vascular findings. Reproductive: Prior hysterectomy noted. Adnexal regions are unremarkable in appearance. Other:  None. Musculoskeletal:  No suspicious bone lesions identified. IMPRESSION: No acute findings. Stable hepatomegaly and diffuse hepatic steatosis. Electronically Signed   By: Danae Orleans M.D.   On: 03/13/2023 18:28   US Pelvic  Complete With Transvaginal Result Date: 03/09/2023 : PROCEDURE: US PELVIS COMPLETE WITH TRANSVAGINAL HISTORY: Patient is a 58 y/o F with LLQ pain. Hysterectomy and right oophorectomy. COMPARISON: CT Abdomen 09/21/2020 US Pelvis 01/22/2016. TECHNIQUE: Two-dimensional transabdominal grayscale and color Doppler ultrasound of the pelvis was performed. Transvaginal was performed. FINDINGS: The uterus and right ovary are surgically absent. The vaginal cuff appears unremarkable. The left ovary measures 1.9 x 1.5 x 1.7 cm and demonstrates multiple adjacent cysts versus a cyst with multiple septations. The largest cyst measures 1.3 cm if measured separately, 2.0 x 1.0 x 1.6 cm. There is normal color Doppler flow. There is no pelvic free fluid. IMPRESSION: 1. Surgically absent uterus and right ovary. 2. Multiple adjacent left ovarian cysts versus a cyst with multiple septations. Pelvic MRI is recommended for further evaluation. Thank you for allowing [place] to assist in the care of this patient. Electronically Signed   By: Lestine Box M.D.   On: 03/09/2023 21:12   US Abdomen Complete Result Date: 03/06/2023 CLINICAL DATA:  Left lower quadrant pain EXAM: ABDOMEN ULTRASOUND COMPLETE COMPARISON:  CT chest 01/01/2023 FINDINGS: Gallbladder: Surgically absent Common bile duct: Diameter: 3.6 mm Liver: Increased coarsened echogenicity and nodular contour. No focal lesion. Portal vein is patent on color Doppler imaging with normal direction of blood flow towards the liver. IVC: No abnormality visualized. Pancreas: Visualized portion unremarkable. Spleen: Size and appearance within normal limits. Right Kidney: Length: 13.3 cm. Echogenicity within normal limits. No mass or hydronephrosis visualized. Left Kidney: Length: 14.1 cm. Echogenicity within normal limits. No mass or hydronephrosis visualized. Abdominal aorta: No aneurysm visualized. Other findings: None. IMPRESSION:  Cirrhotic morphology of the liver. No focal lesion.  Electronically Signed   By: Annia Belt M.D.   On: 03/06/2023 18:22       Assessment and Plan:    1. Small left ovarian cyst (Primary) Reviewed negative postmenopausal ROMA, and reassuring MRI results.  They showed a  left ovarian cyst cluster measuring 1.3 cm which is benign, no intervention needed and no follow up recommended.  Location, severity and pattern of pain reported by patient not consistent with this finding.  Patient also has sigmoid diverticulosis, this can cause pain in same region.  Pain can also be nervous or musculoskeletal. Explained that surgical intervention was not recommended/indicated for this small cyst, surgical risks outweighs the risks and even if the ovary was removed, it is very likely this is not etiology of her pain. Her ovary is overall small, and likely covered with/adherent to bowel at this point.  No follow up recommended for this benign cyst cluster that has likely been present for several years (left ovarian follicles were mentioned on 01/22/2016 ultrasound, also had enlarged left ovary mentioned on 01/17/2005 CT Scan and the ovary was bigger then than it is now. Of note, these scans were also done for abdominal pain evaluation). This was discussed with patient and her daughter, all her questions were addressed. She does have an appointment with her GI provider tomorrow to discuss management of her diverticulosis.  Routine preventative health maintenance measures emphasized. Please refer to After Visit Summary for other counseling recommendations.   Return for any gynecologic concerns.    I spent 40 minutes dedicated to the care of this patient including pre-visit review of records, face to face time with the patient discussing her conditions and treatments and post visit orders.    Jaynie Collins, MD, FACOG Obstetrician & Gynecologist, [facility] [employer], [facility]

## 2023-04-07 NOTE — Progress Notes (Unsigned)
 Marland Kitchen

## 2023-04-08 ENCOUNTER — Ambulatory Visit: Payer: BC Managed Care – PPO | Admitting: Gastroenterology

## 2023-04-09 ENCOUNTER — Telehealth (HOSPITAL_BASED_OUTPATIENT_CLINIC_OR_DEPARTMENT_OTHER): Payer: Self-pay | Admitting: Emergency Medicine

## 2023-04-09 ENCOUNTER — Ambulatory Visit (INDEPENDENT_AMBULATORY_CARE_PROVIDER_SITE_OTHER): Payer: BC Managed Care – PPO | Admitting: Physician Assistant

## 2023-04-09 ENCOUNTER — Encounter (HOSPITAL_BASED_OUTPATIENT_CLINIC_OR_DEPARTMENT_OTHER): Payer: BC Managed Care – PPO

## 2023-04-09 ENCOUNTER — Other Ambulatory Visit (INDEPENDENT_AMBULATORY_CARE_PROVIDER_SITE_OTHER): Payer: BC Managed Care – PPO

## 2023-04-09 ENCOUNTER — Ambulatory Visit
Admission: RE | Admit: 2023-04-09 | Discharge: 2023-04-09 | Disposition: A | Discharge status: Home / Self Care | Payer: BC Managed Care – PPO | Source: Ambulatory Visit | Attending: Physician Assistant | Admitting: Physician Assistant

## 2023-04-09 ENCOUNTER — Encounter (HOSPITAL_BASED_OUTPATIENT_CLINIC_OR_DEPARTMENT_OTHER): Payer: Self-pay

## 2023-04-09 ENCOUNTER — Encounter: Payer: Self-pay | Admitting: Physician Assistant

## 2023-04-09 VITALS — BP 120/80 | HR 84 | Ht 59.0 in | Wt 227.0 lb

## 2023-04-09 DIAGNOSIS — D509 Iron deficiency anemia, unspecified: Secondary | ICD-10-CM

## 2023-04-09 DIAGNOSIS — G8929 Other chronic pain: Secondary | ICD-10-CM

## 2023-04-09 DIAGNOSIS — R1319 Other dysphagia: Secondary | ICD-10-CM

## 2023-04-09 DIAGNOSIS — M25552 Pain in left hip: Secondary | ICD-10-CM | POA: Diagnosis not present

## 2023-04-09 DIAGNOSIS — R1310 Dysphagia, unspecified: Secondary | ICD-10-CM | POA: Diagnosis not present

## 2023-04-09 DIAGNOSIS — R1032 Left lower quadrant pain: Secondary | ICD-10-CM

## 2023-04-09 DIAGNOSIS — R7989 Other specified abnormal findings of blood chemistry: Secondary | ICD-10-CM

## 2023-04-09 DIAGNOSIS — Z6841 Body Mass Index (BMI) 40.0 and over, adult: Secondary | ICD-10-CM

## 2023-04-09 DIAGNOSIS — Z8719 Personal history of other diseases of the digestive system: Secondary | ICD-10-CM

## 2023-04-09 LAB — BASIC METABOLIC PANEL
BUN: 12 mg/dL (ref 6–23)
CO2: 29 meq/L (ref 19–32)
Calcium: 9.2 mg/dL (ref 8.4–10.5)
Chloride: 107 meq/L (ref 96–112)
Creatinine, Ser: 0.74 mg/dL (ref 0.40–1.20)
GFR: 88.96 mL/min (ref >60.00)
Glucose, Bld: 97 mg/dL (ref 70–99)
Potassium: 3.4 meq/L — ABNORMAL LOW (ref 3.5–5.1)
Sodium: 143 meq/L (ref 135–145)

## 2023-04-09 LAB — CBC WITH DIFFERENTIAL/PLATELET
Basophils Absolute: 0.1 10*3/uL (ref 0.0–0.1)
Basophils Relative: 1.1 % (ref 0.0–3.0)
Eosinophils Absolute: 0.5 10*3/uL (ref 0.0–0.7)
Eosinophils Relative: 4.6 % (ref 0.0–5.0)
HCT: 44.6 % (ref 36.0–46.0)
Hemoglobin: 14.4 g/dL (ref 12.0–15.0)
Lymphocytes Relative: 21.6 % (ref 12.0–46.0)
Lymphs Abs: 2.3 10*3/uL (ref 0.7–4.0)
MCHC: 32.3 g/dL (ref 30.0–36.0)
MCV: 87.8 fL (ref 78.0–100.0)
Monocytes Absolute: 0.6 10*3/uL (ref 0.1–1.0)
Monocytes Relative: 6.2 % (ref 3.0–12.0)
Neutro Abs: 6.9 10*3/uL (ref 1.4–7.7)
Neutrophils Relative %: 66.5 % (ref 43.0–77.0)
Platelets: 319 10*3/uL (ref 150.0–400.0)
RBC: 5.09 Mil/uL (ref 3.87–5.11)
RDW: 15.5 % (ref 11.5–15.5)
WBC: 10.4 10*3/uL (ref 4.0–10.5)

## 2023-04-09 LAB — GAMMA GT: GGT: 71 U/L — ABNORMAL HIGH (ref 7–51)

## 2023-04-09 LAB — HEPATIC FUNCTION PANEL
ALT: 28 U/L (ref 0–35)
AST: 24 U/L (ref 0–37)
Albumin: 4.4 g/dL (ref 3.5–5.2)
Alkaline Phosphatase: 72 U/L (ref 39–117)
Bilirubin, Direct: 0.1 mg/dL (ref 0.0–0.3)
Total Bilirubin: 0.3 mg/dL (ref 0.2–1.2)
Total Protein: 7.4 g/dL (ref 6.0–8.3)

## 2023-04-09 LAB — PROTIME-INR
INR: 1.1 {ratio} — ABNORMAL HIGH (ref 0.8–1.0)
Prothrombin Time: 11.3 s (ref 9.6–13.1)

## 2023-04-09 MED ORDER — PANTOPRAZOLE SODIUM 20 MG PO TBEC: 20.0000 mg | DELAYED_RELEASE_TABLET | Freq: Every day | ORAL | 2 refills | Status: DC

## 2023-04-09 MED ORDER — SUFLAVE 178.7 G PO SOLR: 1.0000 | Freq: Once | ORAL | 0 refills | Status: AC

## 2023-04-09 NOTE — Progress Notes (Signed)
04/09/2023 CLEASTER SHIFFER [id number] February 18, 1966  Referring provider: Zola Button, Grayling Congress, * Primary GI doctor: Dr. Rhea Belton  ASSESSMENT AND PLAN:   Iron Deficiency Anemia History of iron deficiency anemia, currently on iron supplementation. Positive fecal occult blood test. -Continue iron supplementation. -While patient had negative Cologuard 2023 patient is due next year, remote history of colonoscopy. -Plan for colonoscopy and endoscopy to evaluate for source of bleeding. -Continue iron    Dysphagia, can have regurgitation, can have nausea 1-2 x a week, denies GERD, vomiting History of hiatal hernia and esophageal stenosis Prior to 1994 had hiatal hernia repair Worse with pills, meat, bread, can be intermittent Schedule EGD with dilatation to evaluate for stenosis, tumor, erosive/infectious esophagititis, and EOE.   Can consider barium swallow/UGI pending results  I discussed risks of EGD with patient today, including risk of sedation, bleeding or perforation.  Patient provides understanding and gave verbal consent to proceed. In the interim patient advised about swallowing precautions.  Add on low dose pantoprazole 20 mg in setting of voltern use, advised to follow up with PCP/ortho for alternative pain control Eat slowly, chew food well before swallowing.  Drink liquids in between each bite to avoid food impaction.  Hepatomegaly and hepatic steatosis on CT  No history of alcohol use, smoking, or drug use. No family history of liver disease. -Order labs for autoimmune hepatitis, primary sclerosing cholangitis, and hepatitis panel. -Check ceruloplasmin level due to history of asthma/COPD. -Monitor liver function and CBC every six months. -Weight loss advised and information given patient  Left Lower Quadrant Pain Chronic, intermittent pain in the left lower quadrant, worse with movement and bending over. Recent imaging (CT, MRI, ultrasound) unremarkable. Associated with  nausea. -Plan for colonoscopy to evaluate colon. -Order hip x-ray to evaluate for musculoskeletal cause, consider follow up with ortho  Morbid obesity  Body mass index is 45.85 kg/m.  -Patient has been advised to make an attempt to improve diet and exercise patterns to aid in weight loss. -Recommended diet heavy in fruits and veggies and low in animal meats, cheeses, and dairy products, appropriate calorie intake    Patient Care Team: Zola Button, Grayling Congress, DO as PCP - General Dominica Severin, MD as Consulting Physician (Orthopedic Surgery) Earma Reading Canary Brim., MD as Physician Assistant (Sports Medicine) Marcille Buffy (Optometry) Dubicki, Zenovia Jordan (Physician Assistant) Peggye Pitt, MD as Referring Physician (Pain Medicine)  HISTORY OF PRESENT ILLNESS: 58 y.o. female with a past medical history of hypertension, migraines, asthma, hyperlipidemia, anxiety, prior cholecystectomy and others listed below presents for evaluation of fatty liver, right upper quadrant abdominal pain and FOBT positive stools.   CT 03/13/2023 shows hepatomegaly with diffuse hepatic steatosis normal pancreas normal spleen. Labs from 03/12/2023 reviewed show normal kidney function, normal lipase and amylase, unremarkable liver function. Iron 50 previously 12/01/2022 iron was low at 34, saturation 18, ferritin 43, B12 383, Hgb 14, WBC 10.6, platelets 304 05/14/2021 cologuard negative  Discussed the use of AI scribe software for clinical note transcription with the patient, who gave verbal consent to proceed.  History of Present Illness   The patient, with a past medical history of hiatal hernia repair, back surgery, knee replacements, iron deficiency, and positive fecal occult blood, presents with a long-standing history of left lower quadrant pain. The pain is described as constant, present every day, and has been ongoing for a long time. The pain is exacerbated by movement, particularly bending over  and reaching.  She states she  has not been able to work since December has been on short-term disability and asking about paperwork here I told her to follow-up with primary care.  The patient also reports associated nausea, which occurs once or twice a week. The patient has been unable to work since December due to the severity of the pain.  In addition to the abdominal pain, the patient also reports dysphagia. The patient describes difficulty swallowing both food and pills, which seems to come and go. The patient reports that the pills often get stuck in the mid-esophagus. The patient also reports instances where food, particularly meat and bread, gets stuck but can be coughed up.  The patient is currently on Voltaren for arthritis and iron supplements for previously diagnosed iron deficiency. The patient reports taking aspirin twice a day and Lyrica. The patient also has a history of asthma. The patient has a history of fatty liver, as seen on a recent CT scan.      She  reports that she has never smoked. She has never used smokeless tobacco. She reports that she does not currently use alcohol. She reports that she does not use drugs.  RELEVANT GI HISTORY, LABS, IMAGING: CT abdomen pelvis with contrast 03/13/2023 IMPRESSION: No acute findings. Stable hepatomegaly and diffuse hepatic steatosis.   03/25/2023 MR pelvis with and without contrast for left ovarian cyst on ultrasound MPRESSION: Small left ovarian cysts measuring up to 13 mm, within normal limits. No follow-up is recommended. Status post hysterectomy. Suspected right salpingo oophorectomy.  CBC    Component Value Date/Time   WBC 10.6 (H) 03/12/2023 1225   RBC 4.87 03/12/2023 1225   HGB 14.0 03/12/2023 1225   HCT 42.4 03/12/2023 1225   PLT 304.0 03/12/2023 1225   MCV 87.0 03/12/2023 1225   MCH 28.3 08/29/2021 1138   MCHC 33.1 03/12/2023 1225   RDW 15.1 03/12/2023 1225   LYMPHSABS 2.4 03/12/2023 1225   MONOABS 0.7 03/12/2023  1225   EOSABS 0.5 03/12/2023 1225   BASOSABS 0.1 03/12/2023 1225   Recent Labs    05/05/22 0922 12/01/22 1014 12/31/22 1013 03/02/23 1019 03/12/23 1225  HGB 13.9 13.6 14.2 14.6 14.0    CMP     Component Value Date/Time   NA 143 03/12/2023 1225   K 3.9 03/12/2023 1225   CL 104 03/12/2023 1225   CO2 29 03/12/2023 1225   GLUCOSE 93 03/12/2023 1225   BUN 12 03/12/2023 1225   CREATININE 0.77 03/12/2023 1225   CALCIUM 9.1 03/12/2023 1225   PROT 6.9 03/12/2023 1225   ALBUMIN 4.2 03/12/2023 1225   AST 21 03/12/2023 1225   ALT 27 03/12/2023 1225   ALKPHOS 86 03/12/2023 1225   BILITOT 0.3 03/12/2023 1225   GFRNONAA >60 08/29/2021 1138   GFRAA >60 02/03/2018 1416      Latest Ref Rng & Units 03/12/2023   12:25 PM 12/01/2022   10:14 AM 09/29/2022    9:22 AM  Hepatic Function  Total Protein 6.0 - 8.3 g/dL 6.9  6.9  7.1   Albumin 3.5 - 5.2 g/dL 4.2  3.8  4.3   AST 0 - 37 U/L 21  18  28    ALT 0 - 35 U/L 27  22  33   Alk Phosphatase 39 - 117 U/L 86  74  82   Total Bilirubin 0.2 - 1.2 mg/dL 0.3  0.3  0.5       Current Medications:   Current Outpatient Medications (Endocrine & Metabolic):  predniSONE (DELTASONE) 20 MG tablet, Take 2 tablets (40 mg total) by mouth daily.   predniSONE (DELTASONE) 50 MG tablet, Take 1 tablet (50 mg total) by mouth 13 hours, 7 hours, then 1 hour prior to procedure.  Current Outpatient Medications (Cardiovascular):    furosemide (LASIX) 40 MG tablet, TAKE ONE TABLET BY MOUTH DAILY  Current Outpatient Medications (Respiratory):    albuterol (PROVENTIL) (2.5 MG/3ML) 0.083% nebulizer solution, Take 3 mLs (2.5 mg total) by nebulization every 6 (six) hours as needed for wheezing or shortness of breath.   albuterol (VENTOLIN HFA) 108 (90 Base) MCG/ACT inhaler, Inhale 2 puffs into the lungs every 6 (six) hours as needed for wheezing or shortness of breath (Cough).   azelastine (ASTELIN) 0.1 % nasal spray, Place 1 spray into both nostrils 2 (two) times  daily. Use in each nostril as directed   benzonatate (TESSALON) 200 MG capsule, Take 1 capsule (200 mg total) by mouth 2 (two) times daily as needed for cough.   fluticasone (FLONASE) 50 MCG/ACT nasal spray, Place 2 sprays into both nostrils daily.   fluticasone-salmeterol (ADVAIR DISKUS) 250-50 MCG/ACT AEPB, Inhale 1 puff into the lungs in the morning and at bedtime.   ipratropium (ATROVENT) 0.03 % nasal spray, Place 2 sprays into both nostrils every 12 (twelve) hours.   montelukast (SINGULAIR) 10 MG tablet, Take 1 tablet (10 mg total) by mouth at bedtime.   promethazine-dextromethorphan (PROMETHAZINE-DM) 6.25-15 MG/5ML syrup, Take 5 mLs by mouth 4 (four) times daily as needed.  Current Outpatient Medications (Analgesics):    diclofenac (VOLTAREN) 75 MG EC tablet, TAKE ONE TABLET BY MOUTH TWICE DAILY   HYDROcodone-acetaminophen (NORCO/VICODIN) 5-325 MG tablet, Take 1 tablet by mouth every 6 (six) hours as needed for moderate pain (pain score 4-6).   Ketorolac Tromethamine (TORADOL ORAL PO), Take 10 mg by mouth every 6 (six) hours as needed.   SUMAtriptan (IMITREX) 100 MG tablet, TAKE 1 TABLET BY MOUTH AS NEEDED FOR MIGRAINE. REPEAT IN 2 HOURS IF NEEDED   traMADol (ULTRAM) 50 MG tablet, Take 1 tablet (50 mg total) by mouth every 8 (eight) hours as needed.   Current Outpatient Medications (Other):    ALPRAZolam (XANAX) 0.5 MG tablet, TAKE 1 TABLET BY MOUTH THREE TIMES PER DAY AS NEEDED   erythromycin base (E-MYCIN) 500 MG tablet, Take 1 tablet (500 mg total) by mouth 2 (two) times daily.   escitalopram (LEXAPRO) 20 MG tablet, Take 1 tablet (20 mg total) by mouth daily.   Meclizine HCl (TRAVEL SICKNESS) 25 MG CHEW, CHEW 1 TABLET EVERY 6 HOURS AS NEEDED   methocarbamol (ROBAXIN-750) 750 MG tablet, Take 1 tablet (750 mg total) by mouth every 6 (six) hours as needed for muscle spasms.   NONFORMULARY OR COMPOUNDED ITEM, Compression socks  20-30 mm hg #1 as directed   nystatin (MYCOSTATIN) 100000  UNIT/ML suspension, Take 5 mLs (500,000 Units total) by mouth 4 (four) times daily.   pantoprazole (PROTONIX) 20 MG tablet, Take 1 tablet (20 mg total) by mouth daily.   pregabalin (LYRICA) 225 MG capsule, TAKE 1 CAPSULE BY MOUTH TWICE DAILY   topiramate (TOPAMAX) 50 MG tablet, Take 1 tablet (50 mg total) by mouth 2 (two) times daily.   triamcinolone cream (KENALOG) 0.1 %, Apply 1 Application topically 2 (two) times daily.  Medical History:  Past Medical History:  Diagnosis Date   Anxiety    Arthritis    Asthma    Headache    migraines   History of hiatal hernia  repaired 30 yrs ago in [place]   Hypertension    MHA (microangiopathic hemolytic anemia) (HCC)    Allergies:  Allergies  Allergen Reactions   Fish Allergy Hives and Swelling   Penicillins Anaphylaxis, Hives and Other (See Comments)    Has patient had a PCN reaction causing immediate rash, facial/tongue/throat swelling, SOB or lightheadedness with hypotension: Unknown Has patient had a PCN reaction causing severe rash involving mucus membranes or skin necrosis: No Has patient had a PCN reaction that required hospitalization: Yes Has patient had a PCN reaction occurring within the last 10 years: No If all of the above answers are "NO", then may proceed with Cephalosporin use.    Tizanidine Hcl Nausea And Vomiting, Rash and Shortness Of Breath   Iohexol Other (See Comments)     Desc: hives, sob, pt. needs 13 hr prep   01/16/05    Ivp Dye [Iodinated Contrast Media] Hives and Swelling   Morphine And Codeine Nausea And Vomiting     Surgical History:  She  has a past surgical history that includes Total abdominal hysterectomy (Right); Laparoscopic cholecystectomy (N/A, 03/1992); Appendectomy (N/A); Tubal ligation; Wrist arthroscopy (Right, 02/12/2018); Replacement total knee (Left, 06/12/2020); Replacement total knee (Right, 01/15/2021); and Back surgery. Family History:  Her family history includes Asthma in an other  family member; Diabetes in an other family member; Hyperlipidemia in an other family member; Kidney failure in her father; Throat cancer in her father.  REVIEW OF SYSTEMS  : All other systems reviewed and negative except where noted in the History of Present Illness.  PHYSICAL EXAM: BP 120/80 (BP Location: Left Arm, Patient Position: Sitting, Cuff Size: Normal)   Pulse 84   Ht 4\' 11"  (1.499 m)   Wt 227 lb (103 kg)   SpO2 94%   BMI 45.85 kg/m  General Appearance: Obese, in no apparent distress. Head:   Normocephalic and atraumatic. Eyes:  sclerae anicteric,conjunctive pink  Respiratory: Respiratory effort normal, BS equal bilaterally without rales, rhonchi, wheezing. Cardio: RRR with no MRGs. Peripheral pulses intact.  Abdomen: Soft,  Obese ,active bowel sounds. Mild to mod LLQ AB pain, mild RUQ AB pain Without guarding and Without rebound. No masses. Rectal: Not evaluated Musculoskeletal: Full ROM, Antalgic gait, pain with palpation of left anterior hip, pain with left hip rotation. Without edema. Skin:  Dry and intact without significant lesions or rashes Neuro: Alert and  oriented x4;  No focal deficits. Psych:  Cooperative. Normal mood and affect.    Doree Albee, PA-C 3:14 PM

## 2023-04-09 NOTE — Telephone Encounter (Signed)
While rescheduling patients PFT patient realized her CT scan was with [facility] imaging patient would like to know if she can have her CT scan [facility] instead?  Please advise and call patient back.

## 2023-04-09 NOTE — Patient Instructions (Addendum)
Your provider has requested that you go to the basement level for lab work before leaving today. Press "B" on the elevator. The lab is located at the first door on the left as you exit the elevator.  Your provider has requested that you have an abdominal x ray before leaving today. Please go to the basement floor to our Radiology department for the test.   Please take your proton pump inhibitor medication, pantoprazole 20 mg daily  Please take this medication 30 minutes to 1 hour before meals- this makes it more effective.  Avoid spicy and acidic foods Avoid fatty foods Limit your intake of coffee, tea, alcohol, and carbonated drinks Work to maintain a healthy weight Keep the head of the bed elevated at least 3 inches with blocks or a wedge pillow if you are having any nighttime symptoms Stay upright for 2 hours after eating Avoid meals and snacks three to four hours before bedtime  You have been scheduled for an endoscopy and colonoscopy. Please follow the written instructions given to you at your visit today.  Please pick up your prep supplies at the pharmacy within the next 1-3 days.  If you use inhalers (even only as needed), please bring them with you on the day of your procedure.  DO NOT TAKE 7 DAYS PRIOR TO TEST- Trulicity (dulaglutide) Ozempic, Wegovy (semaglutide) Mounjaro (tirzepatide) Bydureon Bcise (exanatide extended release)  DO NOT TAKE 1 DAY PRIOR TO YOUR TEST Rybelsus (semaglutide) Adlyxin (lixisenatide) Victoza (liraglutide) Byetta (exanatide) ___________________________________________________________________________  Deborah Watts will receive your bowel preparation through [facility], which ensures the lowest copay and home delivery, with outreach via text or call from an [phone] number. Please respond promptly to avoid rescheduling. If you are interested in alternative options or have any questions please contact them at [phone]  Your Provider Has Sent Your Bowel Prep  Regimen To [facility] What to expect. [facility] will contact you to verify your information and collect your copay, if applicable. Enjoy the comfort of your home while we deliver your prescription to you, free of any shipping charges. Fast, FREE delivery or shipping. [facility] accepts all major insurance benefits and applies discounts & coupons  Have additional questions? [facility]'s patient care team is always here to help.  Chat: [email or web address] Call: [phone] Email: [email or web address] NCPDP: [id number] How will we contact you? Welcome Phone call  a Welcome text and a Checkout link in a text Texts you receive from [phone] Are Not Spam.   *To set up delivery, you must complete the checkout process via link or speak to one of our patient care representatives. If we are unable to reach you, your prescription may be delayed.   Here some information about pelvic floor dysfunction. This may be contributing to some of your symptoms. We will continue with our evaluation but I do want you to consider adding on fiber supplement with low-dose MiraLAX daily. We could also refer to pelvic floor physical therapy.   Pelvic Floor Dysfunction, Female Pelvic floor dysfunction (PFD) is a condition that results when the group of muscles and connective tissues that support the organs in the pelvis (pelvic floor muscles) do not work well. These muscles and their connections form a sling that supports the colon and bladder. In women, they also support the uterus. PFD causes pelvic floor muscles to be too weak, too tight, or both. In PFD, muscle movements are not coordinated. This may cause bowel or bladder problems. It may also cause pain. What are the causes? This  condition may be caused by an injury to the pelvic area or by a weakening of pelvic muscles. This often results from pregnancy and childbirth or other types of strain. In many cases, the exact cause is not known. What  increases the risk? The following factors may make you more likely to develop this condition: Having chronic bladder tissue inflammation (interstitial cystitis). Being an older person. Being overweight. History of radiation treatment for cancer in the pelvic region. Previous pelvic surgery, such as removal of the uterus (hysterectomy). What are the signs or symptoms? Symptoms of this condition vary and may include: Bladder symptoms, such as: Trouble starting urination and emptying the bladder. Frequent urinary tract infections. Leaking urine when coughing, laughing, or exercising (stress incontinence). Having to pass urine urgently or frequently. Pain when passing urine. Bowel symptoms, such as: Constipation. Urgent or frequent bowel movements. Incomplete bowel movements. Painful bowel movements. Leaking stool or gas. Unexplained genital or rectal pain. Genital or rectal muscle spasms. Low back pain. Other symptoms may include: A heavy, full, or aching feeling in the vagina. A bulge that protrudes into the vagina. Pain during or after sex. How is this diagnosed? This condition may be diagnosed based on: Your symptoms and medical history. A physical exam. During the exam, your health care provider may check your pelvic muscles for tightness, spasm, pain, or weakness. This may include a rectal exam and a pelvic exam. In some cases, you may have diagnostic tests, such as: Electrical muscle function tests. Urine flow testing. X-ray tests of bowel function. Ultrasound of the pelvic organs. How is this treated? Treatment for this condition depends on the symptoms. Treatment options include: Physical therapy. This may include Kegel exercises to help relax or strengthen the pelvic floor muscles. Biofeedback. This type of therapy provides feedback on how tight your pelvic floor muscles are so that you can learn to control them. Internal or external massage therapy. A treatment that  involves electrical stimulation of the pelvic floor muscles to help control pain (transcutaneous electrical nerve stimulation, or TENS). Sound wave therapy (ultrasound) to reduce muscle spasms. Medicines, such as: Muscle relaxants. Bladder control medicines. Surgery to reconstruct or support pelvic floor muscles may be an option if other treatments do not help. Follow these instructions at home: Activity Do your usual activities as told by your health care provider. Ask your health care provider if you should modify any activities. Do pelvic floor strengthening or relaxing exercises at home as told by your physical therapist. Lifestyle Maintain a healthy weight. Eat foods that are high in fiber, such as beans, whole grains, and fresh fruits and vegetables. Limit foods that are high in fat and processed sugars, such as fried or sweet foods. Manage stress with relaxation techniques such as yoga or meditation. General instructions If you have problems with leakage: Use absorbable pads or wear padded underwear. Wash frequently with mild soap. Keep your genital and anal area as clean and dry as possible. Ask your health care provider if you should try a barrier cream to prevent skin irritation. Take warm baths to relieve pelvic muscle tension or spasms. Take over-the-counter and prescription medicines only as told by your health care provider. Keep all follow-up visits. How is this prevented? The cause of PFD is not always known, but there are a few things you can do to reduce the risk of developing this condition, including: Staying at a healthy weight. Getting regular exercise. Managing stress. Contact a health care provider if: Your symptoms are  not improving with home care. You have signs or symptoms of PFD that get worse at home. You develop new signs or symptoms. You have signs of a urinary tract infection, such as: Fever. Chills. Increased urinary frequency. A burning feeling  when urinating. You have not had a bowel movement in 3 days (constipation). Summary Pelvic floor dysfunction results when the muscles and connective tissues in your pelvic floor do not work well. These muscles and their connections form a sling that supports your colon and bladder. In women, they also support the uterus. PFD may be caused by an injury to the pelvic area or by a weakening of pelvic muscles. PFD causes pelvic floor muscles to be too weak, too tight, or a combination of both. Symptoms may vary from person to person. In most cases, PFD can be treated with physical therapies and medicines. Surgery may be an option if other treatments do not help. This information is not intended to replace advice given to you by your health care provider. Make sure you discuss any questions you have with your health care provider. Document Revised: 07/11/2020 Document Reviewed: 07/11/2020 Elsevier Patient Education  2022 [employer].

## 2023-04-09 NOTE — Telephone Encounter (Signed)
Pt has been rescheduled and left PT a voicemail about their rescheduled appointment.

## 2023-04-10 ENCOUNTER — Ambulatory Visit: Payer: BC Managed Care – PPO | Admitting: Emergency Medicine

## 2023-04-13 ENCOUNTER — Encounter: Payer: Self-pay | Admitting: Family Medicine

## 2023-04-13 LAB — ANA: Anti Nuclear Antibody (ANA): POSITIVE — AB

## 2023-04-13 LAB — ALPHA-1-ANTITRYPSIN: A-1 Antitrypsin, Ser: 141 mg/dL (ref 83–199)

## 2023-04-13 LAB — TISSUE TRANSGLUTAMINASE, IGA: (tTG) Ab, IgA: <1 U/mL

## 2023-04-13 LAB — IGA: Immunoglobulin A: 327 mg/dL — ABNORMAL HIGH (ref 47–310)

## 2023-04-13 LAB — MITOCHONDRIAL ANTIBODIES: Mitochondrial M2 Ab, IgG: <=20 U (ref <=20.0)

## 2023-04-13 LAB — ANTI-NUCLEAR AB-TITER (ANA TITER): ANA Titer 1: 1:80 {titer} — ABNORMAL HIGH

## 2023-04-13 LAB — IGG: IgG (Immunoglobin G), Serum: 1107 mg/dL (ref 600–1640)

## 2023-04-13 LAB — ANTI-SMOOTH MUSCLE ANTIBODY, IGG: Actin (Smooth Muscle) Antibody (IGG): <20 U (ref <20)

## 2023-04-13 MED ORDER — PREGABALIN 225 MG PO CAPS: 225.0000 mg | ORAL_CAPSULE | Freq: Two times a day (BID) | ORAL | 1 refills | Status: DC

## 2023-04-13 NOTE — Telephone Encounter (Signed)
Requesting: Lyrica Contract: 05/02/21 UDS: 05/02/2021 Last OV: 03/12/23 Next OV: 05/08/2023 Last Refill: 02/02/2023, #60--1 RF Database:   Please advise

## 2023-04-16 ENCOUNTER — Encounter: Payer: Self-pay | Admitting: Family Medicine

## 2023-04-23 ENCOUNTER — Encounter: Payer: Self-pay | Admitting: Family Medicine

## 2023-04-23 ENCOUNTER — Ambulatory Visit (HOSPITAL_BASED_OUTPATIENT_CLINIC_OR_DEPARTMENT_OTHER)
Admission: RE | Admit: 2023-04-23 | Discharge: 2023-04-23 | Disposition: A | Discharge status: Home / Self Care | Payer: BC Managed Care – PPO | Source: Ambulatory Visit | Attending: Family Medicine | Admitting: Family Medicine

## 2023-04-23 ENCOUNTER — Ambulatory Visit: Payer: BC Managed Care – PPO | Admitting: Family Medicine

## 2023-04-23 VITALS — BP 130/80 | HR 92 | Temp 97.6°F | Resp 20 | Ht 59.0 in | Wt 228.0 lb

## 2023-04-23 DIAGNOSIS — M5442 Lumbago with sciatica, left side: Secondary | ICD-10-CM

## 2023-04-23 DIAGNOSIS — G8929 Other chronic pain: Secondary | ICD-10-CM

## 2023-04-23 DIAGNOSIS — R768 Other specified abnormal immunological findings in serum: Secondary | ICD-10-CM | POA: Diagnosis not present

## 2023-04-23 MED ORDER — PREGABALIN 300 MG PO CAPS: 300.0000 mg | ORAL_CAPSULE | Freq: Two times a day (BID) | ORAL | 1 refills | Status: DC

## 2023-04-23 NOTE — Patient Instructions (Signed)

## 2023-04-23 NOTE — Progress Notes (Signed)
 Established Patient Office Visit  Subjective   Patient ID: Deborah Watts, female    DOB: 16-Mar-1966  Age: 58 y.o. MRN: [id number]  Chief Complaint  Patient presents with   Back Pain    Pt states having low back pain.     HPI Discussed the use of AI scribe software for clinical note transcription with the patient, who gave verbal consent to proceed.  History of Present Illness   Deborah Watts is a 58 year old female with liver cirrhosis who presents with confusion about return to work clearance. She is accompanied by her daughter, Lauraine.  She has been experiencing severe abdominal pain associated with liver cirrhosis, which causes her to double over. The pain has led to her being out of work since January 27th, and she is seeking clearance to return. Upcoming endoscopy and colonoscopy procedures are scheduled for March to further investigate her gastrointestinal symptoms.  She has non-alcoholic fatty liver disease and experiences reflux, nausea, and dry heaves. She has difficulty swallowing, with food getting stuck, and persistent upper abdominal pain, especially after physical activities like doing laundry. She tries to eat right and avoid red meat due to her liver condition.  She has a history of degenerative disc disease and underwent back surgery approximately a year ago. She experiences pain down her spine and across her back, particularly on the left side, which sometimes radiates down her leg. She is currently taking Voltaren  for arthritis pain and Lyrica  for nerve pain, which she takes twice a day.  She has a history of ovarian cysts, which she believes may be contributing to her abdominal pain. She mentions a slightly positive ANA test and has been informed of low-level autoimmune markers. She reports symptoms such as dry mouth, dry eyes, and joint pains.      Patient Active Problem List   Diagnosis Date Noted   ANA positive 04/23/2023   Right upper quadrant abdominal pain  03/12/2023   Complex small left ovarian cyst on ultrasound 03/12/2023   Mild intermittent asthma with acute exacerbation 03/12/2023   Left lower quadrant abdominal pain 03/02/2023   Urinary frequency 03/02/2023   Iron deficiency anemia 03/02/2023   Pulmonary nodules 02/20/2023   Suspected sleep apnea 02/20/2023   Allergic rhinitis 02/20/2023   Pain of left calf 08/12/2022   Cellulitis of left lower extremity 08/12/2022   Edema, lower extremity 07/21/2022   SOB (shortness of breath) 07/21/2022   Hyperlipidemia 07/21/2022   Major depressive disorder, recurrent episode, moderate (HCC) 11/25/2021   Generalized anxiety disorder 11/25/2021   Depression with anxiety 10/28/2021   Asthma, chronic, unspecified asthma severity, uncomplicated 05/02/2021   Loud snoring 11/28/2019   Daytime sleepiness 11/28/2019   Other fatigue 11/28/2019   Primary osteoarthritis of left knee 08/02/2019   Rib pain 10/19/2018   Right knee pain 04/12/2017   Preventative health care 03/25/2016   Left ankle pain 01/14/2016   Left leg pain 04/28/2014   Pain of right calf 04/20/2014   Obesity (BMI 30-39.9) 01/06/2013   Thrush, oral 03/04/2011   TINEA CRURIS 02/05/2010   HYPOKALEMIA 04/26/2009   Primary hypertension 02/08/2008   LOW BACK PAIN SYNDROME 05/26/2007   LEG CRAMPS 05/14/2007   Edema 05/14/2007   Chronic migraine without aura, with status migrainosus 05/01/2007   ASTHMATIC BRONCHITIS, ACUTE 10/15/2006   Anxiety state 04/20/2006   RESTLESS LEG SYNDROME, HX OF 04/20/2006   Past Medical History:  Diagnosis Date   Anxiety    Arthritis  Asthma    Headache    migraines   History of hiatal hernia    repaired 30 yrs ago in [place]   Hypertension    MHA (microangiopathic hemolytic anemia) (HCC)    Past Surgical History:  Procedure Laterality Date   APPENDECTOMY N/A    BACK SURGERY     LAPAROSCOPIC CHOLECYSTECTOMY N/A 03/1992   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   REPLACEMENT  TOTAL KNEE Right 01/15/2021   [facility]   TOTAL ABDOMINAL HYSTERECTOMY Right    TUBAL LIGATION     WRIST ARTHROSCOPY Right 02/12/2018   Procedure: Right wrist arthroscopy, evaluation under anesthesia with debridement and repair as necessary and right carpal tunnel release;  Surgeon: Camella Fallow, MD;  Location: [facility] OR;  Service: Orthopedics;  Laterality: Right;  90 mins   Social History   Tobacco Use   Smoking status: Never   Smokeless tobacco: Never  Vaping Use   Vaping status: Never Used  Substance Use Topics   Alcohol use: Not Currently    Comment: rare   Drug use: No   Social History   Socioeconomic History   Marital status: Married    Spouse name: Not on file   Number of children: Not on file   Years of education: Not on file   Highest education level: Some college, no degree  Occupational History   Occupation: Administrator, 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[employer] [place]  Tobacco Use   Smoking status: Never   Smokeless tobacco: Never  Vaping Use   Vaping status: Never Used  Substance and Sexual Activity   Alcohol use: Not Currently    Comment: rare   Drug use: No   Sexual activity: Not on file  Other Topics Concern   Not on file  Social History Narrative   Exercise-- no   Social Drivers of Health   Financial Resource Strain: Low Risk  (03/02/2023)   Overall Financial Resource Strain (CARDIA)    Difficulty of Paying Living Expenses: Not very hard  Food Insecurity: No Food Insecurity (03/02/2023)   Hunger Vital Sign    Worried About Running Out of Food in the Last Year: Never true    Ran Out of Food in the Last Year: Never true  Transportation Needs: No Transportation Needs (03/02/2023)   PRAPARE - Administrator, Civil Service (Medical): No    Lack of Transportation (Non-Medical): No  Physical Activity: Inactive (03/02/2023)   Exercise Vital Sign    Days of Exercise per Week: 0 days    Minutes of Exercise per Session: 10 min  Stress: No Stress Concern  Present (03/02/2023)   [employer] of Occupational Health - Occupational Stress Questionnaire    Feeling of Stress : Only a little  Social Connections: Socially Integrated (03/02/2023)   Social Connection and Isolation Panel [NHANES]    Frequency of Communication with Friends and Family: More than three times a week    Frequency of Social Gatherings with Friends and Family: Once a week    Attends Religious Services: More than 4 times per year    Active Member of [employer] or Organizations: Yes    Attends Banker Meetings: 1 to 4 times per year    Marital Status: Married  Catering Manager Violence: Not At Risk (03/19/2022)   Received [facility], [facility]   HITS    Over the last 12 months how often did your partner physically hurt you?: Never  Over the last 12 months how often did your partner insult you or talk down to you?: Never    Over the last 12 months how often did your partner threaten you with physical harm?: Never    Over the last 12 months how often did your partner scream or curse at you?: Never   Family Status  Relation Name Status   Mother  Alive   Father  Deceased at age 39   Other  (Not Specified)  No partnership data on file   Family History  Problem Relation Age of Onset   Throat cancer Father    Kidney failure Father    Asthma Other    Diabetes Other    Hyperlipidemia Other    Allergies  Allergen Reactions   Fish Allergy Hives and Swelling   Penicillins Anaphylaxis, Hives and Other (See Comments)    Has patient had a PCN reaction causing immediate rash, facial/tongue/throat swelling, SOB or lightheadedness with hypotension: Unknown Has patient had a PCN reaction causing severe rash involving mucus membranes or skin necrosis: No Has patient had a PCN reaction that required hospitalization: Yes Has patient had a PCN reaction occurring within the last 10 years: No If all of the above answers are NO, then may proceed with  Cephalosporin use.    Tizanidine Hcl Nausea And Vomiting, Rash and Shortness Of Breath   Iohexol  Other (See Comments)     Desc: hives, sob, pt. needs 13 hr prep   01/16/05    Ivp Dye [Iodinated Contrast Media] Hives and Swelling   Morphine  And Codeine  Nausea And Vomiting      Review of Systems  Constitutional:  Negative for chills, fever and malaise/fatigue.  HENT:  Negative for congestion and hearing loss.   Eyes:  Negative for blurred vision and discharge.  Respiratory:  Negative for cough, sputum production and shortness of breath.   Cardiovascular:  Negative for chest pain, palpitations and leg swelling.  Gastrointestinal:  Negative for abdominal pain, blood in stool, constipation, diarrhea, heartburn, nausea and vomiting.  Genitourinary:  Negative for dysuria, frequency, hematuria and urgency.  Musculoskeletal:  Negative for back pain, falls and myalgias.  Skin:  Negative for rash.  Neurological:  Negative for dizziness, sensory change, loss of consciousness, weakness and headaches.  Endo/Heme/Allergies:  Negative for environmental allergies. Does not bruise/bleed easily.  Psychiatric/Behavioral:  Negative for depression and suicidal ideas. The patient is not nervous/anxious and does not have insomnia.       Objective:     BP 130/80 (BP Location: Left Arm, Patient Position: Sitting)   Pulse 92   Temp 97.6 F (36.4 C) (Oral)   Resp 20   Ht 4' 11 (1.499 m)   Wt 228 lb (103.4 kg)   SpO2 95%   BMI 46.05 kg/m  BP Readings from Last 3 Encounters:  04/23/23 130/80  04/09/23 120/80  04/06/23 (!) 147/83   Wt Readings from Last 3 Encounters:  04/23/23 228 lb (103.4 kg)  04/09/23 227 lb (103 kg)  04/06/23 232 lb (105.2 kg)   SpO2 Readings from Last 3 Encounters:  04/23/23 95%  04/09/23 94%  03/12/23 96%      Physical Exam Vitals and nursing note reviewed.  Constitutional:      General: She is not in acute distress.    Appearance: Normal appearance. She is  well-developed.  HENT:     Head: Normocephalic and atraumatic.  Eyes:     General: No scleral icterus.  Right eye: No discharge.        Left eye: No discharge.  Cardiovascular:     Rate and Rhythm: Normal rate and regular rhythm.     Heart sounds: No murmur heard. Pulmonary:     Effort: Pulmonary effort is normal. No respiratory distress.     Breath sounds: Normal breath sounds.  Musculoskeletal:        General: Normal range of motion.     Cervical back: Normal range of motion and neck supple.     Right lower leg: No edema.     Left lower leg: No edema.  Skin:    General: Skin is warm and dry.  Neurological:     Mental Status: She is alert and oriented to person, place, and time.  Psychiatric:        Mood and Affect: Mood normal.        Behavior: Behavior normal.        Thought Content: Thought content normal.        Judgment: Judgment normal.      No results found for any visits on 04/23/23.  Last CBC Lab Results  Component Value Date   WBC 10.4 04/09/2023   HGB 14.4 04/09/2023   HCT 44.6 04/09/2023   MCV 87.8 04/09/2023   MCH 28.3 08/29/2021   RDW 15.5 04/09/2023   PLT 319.0 04/09/2023   Last metabolic panel Lab Results  Component Value Date   GLUCOSE 97 04/09/2023   NA 143 04/09/2023   K 3.4 (L) 04/09/2023   CL 107 04/09/2023   CO2 29 04/09/2023   BUN 12 04/09/2023   CREATININE 0.74 04/09/2023   GFR 88.96 04/09/2023   CALCIUM 9.2 04/09/2023   PROT 7.4 04/09/2023   ALBUMIN 4.4 04/09/2023   BILITOT 0.3 04/09/2023   ALKPHOS 72 04/09/2023   AST 24 04/09/2023   ALT 28 04/09/2023   ANIONGAP 7 08/29/2021   Last lipids Lab Results  Component Value Date   CHOL 158 03/02/2023   HDL 33.20 (L) 03/02/2023   LDLCALC 86 03/02/2023   LDLDIRECT 100.0 07/21/2022   TRIG 193.0 (H) 03/02/2023   CHOLHDL 5 03/02/2023   Last hemoglobin A1c Lab Results  Component Value Date   HGBA1C 6.6 (H) 07/28/2022   Last thyroid  functions Lab Results  Component  Value Date   TSH 1.26 03/02/2023   Last vitamin D  No results found for: MARIEN BOLLS, VD25OH Last vitamin B12 and Folate Lab Results  Component Value Date   VITAMINB12 383 12/01/2022   FOLATE 9.7 12/01/2022      The 10-year ASCVD risk score (Arnett DK, et al., 2019) is: 4.5%    Assessment & Plan:   Problem List Items Addressed This Visit       Unprioritized   LOW BACK PAIN SYNDROME - Primary   Relevant Medications   pregabalin  (LYRICA ) 300 MG capsule   Other Relevant Orders   DG Lumbar Spine Complete   ANA positive   Relevant Orders   Lupus anticoagulant panel( [facility] [facility])  Assessment and Plan    Abdominal Pain Chronic abdominal pain is associated with liver cirrhosis and gastrointestinal symptoms, including severe pain, nausea, dry heaves, and dysphagia. Scheduled for endoscopy and colonoscopy in March to aid in accurate diagnosis and treatment planning. Differential diagnosis includes esophageal reflux, fatty liver, and potential gastrointestinal inflammation. Proceed with scheduled procedures and monitor symptoms, reporting any worsening or new symptoms.  Degenerative Disc Disease Chronic back pain persists post-surgery (L4,  L5, S1) from one year ago, with pain radiating down the spine and left leg. Current medication is Voltaren , which may cause gastrointestinal side effects. Discussed alternative medications and increasing Lyrica  for nerve pain management. Increase Lyrica  to 300 mg daily and order a spine x-ray to assess the surgical site. Monitor pain levels and consider a neurosurgery referral if pain persists or worsens.  Ovarian Cysts Lower abdominal pain is attributed to ovarian cysts. Previous OB/GYN consultation was unsatisfactory. Refer to a different OB/GYN for further evaluation and management.  Autoimmune Screening Slightly positive ANA with symptoms of dry mouth, dry eyes, joint pains, and elevated liver function tests  suggest a differential diagnosis of lupus and other autoimmune conditions. Order a lupus panel and additional autoimmune testing to rule out these conditions. Follow up on lab results and adjust the treatment plan accordingly.  General Health Maintenance Discussed lifestyle modifications, emphasizing dietary changes to avoid red meat due to liver condition. Continue dietary modifications to support liver health and reduce inflammation.  Follow-up Fax return to work paperwork to [place]. Monitor ability to work and adjust as needed. Follow up in three weeks for x-ray results and further evaluation.        Return if symptoms worsen or fail to improve.    Yul Diana R Lowne Chase, DO

## 2023-04-24 ENCOUNTER — Other Ambulatory Visit: Payer: Self-pay | Admitting: Family Medicine

## 2023-04-24 DIAGNOSIS — R109 Unspecified abdominal pain: Secondary | ICD-10-CM

## 2023-04-24 DIAGNOSIS — M170 Bilateral primary osteoarthritis of knee: Secondary | ICD-10-CM

## 2023-04-24 MED ORDER — DICLOFENAC SODIUM 75 MG PO TBEC: 75.0000 mg | DELAYED_RELEASE_TABLET | Freq: Two times a day (BID) | ORAL | 1 refills | Status: DC

## 2023-04-24 MED ORDER — TRAMADOL HCL 50 MG PO TABS: 50.0000 mg | ORAL_TABLET | Freq: Three times a day (TID) | ORAL | 0 refills | Status: DC | PRN

## 2023-04-24 NOTE — Telephone Encounter (Signed)
 Requesting: tramadol  50mg   Contract: 05/02/21 UDS: 05/02/21 Last Visit: 04/24/23 Next Visit: 05/08/23 Last Refill: 05/05/22 #30 and 0RF   Please Advise

## 2023-04-25 LAB — LUPUS ANTICOAGULANT PANEL
Dilute Viper Venom Time: 38.4 s (ref 0.0–47.0)
PTT Lupus Anticoagulant: 36.9 s (ref 0.0–43.5)

## 2023-04-27 ENCOUNTER — Encounter: Payer: Self-pay | Admitting: Family Medicine

## 2023-05-07 ENCOUNTER — Other Ambulatory Visit: Payer: Self-pay

## 2023-05-07 ENCOUNTER — Encounter: Payer: Self-pay | Admitting: Family Medicine

## 2023-05-07 DIAGNOSIS — G8929 Other chronic pain: Secondary | ICD-10-CM

## 2023-05-08 ENCOUNTER — Ambulatory Visit: Payer: BC Managed Care – PPO | Admitting: Family Medicine

## 2023-05-08 ENCOUNTER — Encounter: Payer: Self-pay | Admitting: Family Medicine

## 2023-05-08 VITALS — BP 138/90 | HR 77 | Temp 97.9°F | Resp 20 | Ht 59.0 in | Wt 228.6 lb

## 2023-05-08 DIAGNOSIS — F418 Other specified anxiety disorders: Secondary | ICD-10-CM

## 2023-05-08 DIAGNOSIS — J4521 Mild intermittent asthma with (acute) exacerbation: Secondary | ICD-10-CM

## 2023-05-08 DIAGNOSIS — M170 Bilateral primary osteoarthritis of knee: Secondary | ICD-10-CM | POA: Diagnosis not present

## 2023-05-08 DIAGNOSIS — M5442 Lumbago with sciatica, left side: Secondary | ICD-10-CM

## 2023-05-08 DIAGNOSIS — F411 Generalized anxiety disorder: Secondary | ICD-10-CM

## 2023-05-08 DIAGNOSIS — K219 Gastro-esophageal reflux disease without esophagitis: Secondary | ICD-10-CM

## 2023-05-08 DIAGNOSIS — R600 Localized edema: Secondary | ICD-10-CM

## 2023-05-08 DIAGNOSIS — Z23 Encounter for immunization: Secondary | ICD-10-CM

## 2023-05-08 DIAGNOSIS — Z0000 Encounter for general adult medical examination without abnormal findings: Secondary | ICD-10-CM | POA: Diagnosis not present

## 2023-05-08 DIAGNOSIS — G8929 Other chronic pain: Secondary | ICD-10-CM

## 2023-05-08 DIAGNOSIS — R1032 Left lower quadrant pain: Secondary | ICD-10-CM

## 2023-05-08 DIAGNOSIS — I10 Essential (primary) hypertension: Secondary | ICD-10-CM

## 2023-05-08 DIAGNOSIS — J45909 Unspecified asthma, uncomplicated: Secondary | ICD-10-CM

## 2023-05-08 DIAGNOSIS — D509 Iron deficiency anemia, unspecified: Secondary | ICD-10-CM

## 2023-05-08 DIAGNOSIS — M62830 Muscle spasm of back: Secondary | ICD-10-CM

## 2023-05-08 DIAGNOSIS — Z8669 Personal history of other diseases of the nervous system and sense organs: Secondary | ICD-10-CM

## 2023-05-08 LAB — COMPREHENSIVE METABOLIC PANEL
ALT: 30 U/L (ref 0–35)
AST: 27 U/L (ref 0–37)
Albumin: 4.2 g/dL (ref 3.5–5.2)
Alkaline Phosphatase: 86 U/L (ref 39–117)
BUN: 12 mg/dL (ref 6–23)
CO2: 31 meq/L (ref 19–32)
Calcium: 9 mg/dL (ref 8.4–10.5)
Chloride: 108 meq/L (ref 96–112)
Creatinine, Ser: 0.8 mg/dL (ref 0.40–1.20)
GFR: 80.97 mL/min (ref >60.00)
Glucose, Bld: 92 mg/dL (ref 70–99)
Potassium: 4.6 meq/L (ref 3.5–5.1)
Sodium: 145 meq/L (ref 135–145)
Total Bilirubin: 0.4 mg/dL (ref 0.2–1.2)
Total Protein: 6.9 g/dL (ref 6.0–8.3)

## 2023-05-08 LAB — CBC WITH DIFFERENTIAL/PLATELET
Basophils Absolute: 0.1 10*3/uL (ref 0.0–0.1)
Basophils Relative: 0.8 % (ref 0.0–3.0)
Eosinophils Absolute: 0.5 10*3/uL (ref 0.0–0.7)
Eosinophils Relative: 5.2 % — ABNORMAL HIGH (ref 0.0–5.0)
HCT: 44.7 % (ref 36.0–46.0)
Hemoglobin: 14.4 g/dL (ref 12.0–15.0)
Lymphocytes Relative: 21.8 % (ref 12.0–46.0)
Lymphs Abs: 2 10*3/uL (ref 0.7–4.0)
MCHC: 32.3 g/dL (ref 30.0–36.0)
MCV: 89.5 fL (ref 78.0–100.0)
Monocytes Absolute: 0.4 10*3/uL (ref 0.1–1.0)
Monocytes Relative: 4.9 % (ref 3.0–12.0)
Neutro Abs: 6 10*3/uL (ref 1.4–7.7)
Neutrophils Relative %: 67.3 % (ref 43.0–77.0)
Platelets: 297 10*3/uL (ref 150.0–400.0)
RBC: 4.99 Mil/uL (ref 3.87–5.11)
RDW: 15.1 % (ref 11.5–15.5)
WBC: 9 10*3/uL (ref 4.0–10.5)

## 2023-05-08 LAB — LIPID PANEL
Cholesterol: 150 mg/dL (ref 0–200)
HDL: 32.2 mg/dL — ABNORMAL LOW (ref >39.00)
LDL Cholesterol: 80 mg/dL (ref 0–99)
NonHDL: 117.65
Total CHOL/HDL Ratio: 5
Triglycerides: 189 mg/dL — ABNORMAL HIGH (ref 0.0–149.0)
VLDL: 37.8 mg/dL (ref 0.0–40.0)

## 2023-05-08 LAB — TSH: TSH: 0.85 u[IU]/mL (ref 0.35–5.50)

## 2023-05-08 MED ORDER — FLUTICASONE-SALMETEROL 250-50 MCG/ACT IN AEPB: 1.0000 | INHALATION_SPRAY | Freq: Two times a day (BID) | RESPIRATORY_TRACT | 11 refills | Status: DC

## 2023-05-08 MED ORDER — TOPIRAMATE 50 MG PO TABS: 50.0000 mg | ORAL_TABLET | Freq: Two times a day (BID) | ORAL | 1 refills | Status: DC

## 2023-05-08 MED ORDER — ALBUTEROL SULFATE HFA 108 (90 BASE) MCG/ACT IN AERS: 2.0000 | INHALATION_SPRAY | Freq: Four times a day (QID) | RESPIRATORY_TRACT | 3 refills | Status: AC | PRN

## 2023-05-08 MED ORDER — PANTOPRAZOLE SODIUM 20 MG PO TBEC: 20.0000 mg | DELAYED_RELEASE_TABLET | Freq: Every day | ORAL | 2 refills | Status: DC

## 2023-05-08 MED ORDER — ALPRAZOLAM 0.5 MG PO TABS: ORAL_TABLET | ORAL | 1 refills | Status: DC

## 2023-05-08 MED ORDER — ESCITALOPRAM OXALATE 20 MG PO TABS: 20.0000 mg | ORAL_TABLET | Freq: Every day | ORAL | 3 refills | Status: DC

## 2023-05-08 MED ORDER — PREGABALIN 300 MG PO CAPS
300.0000 mg | ORAL_CAPSULE | Freq: Two times a day (BID) | ORAL | 1 refills | Status: AC
Start: 2023-05-08 — End: ?

## 2023-05-08 MED ORDER — SUMATRIPTAN SUCCINATE 100 MG PO TABS
ORAL_TABLET | ORAL | 3 refills | Status: AC
Start: 2023-05-08 — End: ?

## 2023-05-08 MED ORDER — DICLOFENAC SODIUM 75 MG PO TBEC
75.0000 mg | DELAYED_RELEASE_TABLET | Freq: Two times a day (BID) | ORAL | 1 refills | Status: DC
Start: 2023-05-08 — End: 2023-08-11

## 2023-05-08 MED ORDER — MONTELUKAST SODIUM 10 MG PO TABS: 10.0000 mg | ORAL_TABLET | Freq: Every day | ORAL | 3 refills | Status: DC

## 2023-05-08 MED ORDER — FUROSEMIDE 40 MG PO TABS: 40.0000 mg | ORAL_TABLET | Freq: Every day | ORAL | 3 refills | Status: DC

## 2023-05-08 MED ORDER — METHOCARBAMOL 750 MG PO TABS: 750.0000 mg | ORAL_TABLET | Freq: Four times a day (QID) | ORAL | Status: AC | PRN

## 2023-05-08 MED ORDER — AZELASTINE HCL 0.1 % NA SOLN
1.0000 | Freq: Two times a day (BID) | NASAL | 12 refills | Status: AC
Start: 2023-05-08 — End: ?

## 2023-05-08 NOTE — Patient Instructions (Signed)
 Preventive Care 16-59 Years Old, Female  Preventive care refers to lifestyle choices and visits with your health care provider that can promote health and wellness. Preventive care visits are also called wellness exams.  What can I expect for my preventive care visit?  Counseling  Your health care provider may ask you questions about your:  Medical history, including:  Past medical problems.  Family medical history.  Pregnancy history.  Current health, including:  Menstrual cycle.  Method of birth control.  Emotional well-being.  Home life and relationship well-being.  Sexual activity and sexual health.  Lifestyle, including:  Alcohol, nicotine or tobacco, and drug use.  Access to firearms.  Diet, exercise, and sleep habits.  Work and work Astronomer.  Sunscreen use.  Safety issues such as seatbelt and bike helmet use.  Physical exam  Your health care provider will check your:  Height and weight. These may be used to calculate your BMI (body mass index). BMI is a measurement that tells if you are at a healthy weight.  Waist circumference. This measures the distance around your waistline. This measurement also tells if you are at a healthy weight and may help predict your risk of certain diseases, such as type 2 diabetes and high blood pressure.  Heart rate and blood pressure.  Body temperature.  Skin for abnormal spots.  What immunizations do I need?    Vaccines are usually given at various ages, according to a schedule. Your health care provider will recommend vaccines for you based on your age, medical history, and lifestyle or other factors, such as travel or where you work.  What tests do I need?  Screening  Your health care provider may recommend screening tests for certain conditions. This may include:  Lipid and cholesterol levels.  Diabetes screening. This is done by checking your blood sugar (glucose) after you have not eaten for a while (fasting).  Pelvic exam and Pap test.  Hepatitis B test.  Hepatitis C  test.  HIV (human immunodeficiency virus) test.  STI (sexually transmitted infection) testing, if you are at risk.  Lung cancer screening.  Colorectal cancer screening.  Mammogram. Talk with your health care provider about when you should start having regular mammograms. This may depend on whether you have a family history of breast cancer.  BRCA-related cancer screening. This may be done if you have a family history of breast, ovarian, tubal, or peritoneal cancers.  Bone density scan. This is done to screen for osteoporosis.  Talk with your health care provider about your test results, treatment options, and if necessary, the need for more tests.  Follow these instructions at home:  Eating and drinking    Eat a diet that includes fresh fruits and vegetables, whole grains, lean protein, and low-fat dairy products.  Take vitamin and mineral supplements as recommended by your health care provider.  Do not drink alcohol if:  Your health care provider tells you not to drink.  You are pregnant, may be pregnant, or are planning to become pregnant.  If you drink alcohol:  Limit how much you have to 0-1 drink a day.  Know how much alcohol is in your drink. In the [place] one drink equals one 12 oz bottle of beer (355 mL), one 5 oz glass of wine (148 mL), or one 1 oz glass of hard liquor (44 mL).  Lifestyle  Brush your teeth every morning and night with fluoride toothpaste. Floss one time each day.  Exercise for at least  30 minutes 5 or more days each week.  Do not use any products that contain nicotine or tobacco. These products include cigarettes, chewing tobacco, and vaping devices, such as e-cigarettes. If you need help quitting, ask your health care provider.  Do not use drugs.  If you are sexually active, practice safe sex. Use a condom or other form of protection to prevent STIs.  If you do not wish to become pregnant, use a form of birth control. If you plan to become pregnant, see your health care provider for a  prepregnancy visit.  Take aspirin only as told by your health care provider. Make sure that you understand how much to take and what form to take. Work with your health care provider to find out whether it is safe and beneficial for you to take aspirin daily.  Find healthy ways to manage stress, such as:  Meditation, yoga, or listening to music.  Journaling.  Talking to a trusted person.  Spending time with friends and family.  Minimize exposure to UV radiation to reduce your risk of skin cancer.  Safety  Always wear your seat belt while driving or riding in a vehicle.  Do not drive:  If you have been drinking alcohol. Do not ride with someone who has been drinking.  When you are tired or distracted.  While texting.  If you have been using any mind-altering substances or drugs.  Wear a helmet and other protective equipment during sports activities.  If you have firearms in your house, make sure you follow all gun safety procedures.  Seek help if you have been physically or sexually abused.  What's next?  Visit your health care provider once a year for an annual wellness visit.  Ask your health care provider how often you should have your eyes and teeth checked.  Stay up to date on all vaccines.  This information is not intended to replace advice given to you by your health care provider. Make sure you discuss any questions you have with your health care provider.  Document Revised: 08/29/2020 Document Reviewed: 08/29/2020  Elsevier Patient Education  2024 [employer].

## 2023-05-08 NOTE — Assessment & Plan Note (Signed)
 Ghm utd Check labs  See [facility] Health Maintenance  Topic Date Due   INFLUENZA VACCINE  06/15/2023 (Originally 10/16/2022)   COVID-19 Vaccine (4 - 2024-25 season) 12/01/2023 (Originally 11/16/2022)   MAMMOGRAM  05/26/2023   Fecal DNA (Cologuard)  05/14/2024   DTaP/Tdap/Td (4 - Td or Tdap) 03/19/2032   Pneumococcal Vaccine 89-59 Years old  Completed   Hepatitis C Screening  Completed   HIV Screening  Completed   Zoster Vaccines- Shingrix  Completed   HPV VACCINES  Aged Out

## 2023-05-08 NOTE — Progress Notes (Signed)
 Established Patient Office Visit  Subjective   Patient ID: Deborah Watts, female    DOB: [birth year]  Age: 58 y.o. MRN: [id number]  Chief Complaint  Patient presents with   Annual Exam    Pt states fasting     HPI Discussed the use of AI scribe software for clinical note transcription with the patient, who gave verbal consent to proceed.  History of Present Illness   Deborah Watts is a 58 year old female with back pain and asthma who presents for a physical exam and medication refills.  She has a history of persistent back pain, which has led her to resume methocarbamol (Robaxin) due to increased discomfort. There has been no recent injury or specific event exacerbating her back pain. She previously underwent physical therapy, which was ineffective and costly. She had back surgery performed by Dr. Petra Kuba, and an MRI from May 2023 showed moderate spinal stenosis at L5 S1 and severe stenosis at L4 S5. An injection administered under x-ray guidance in May did not provide relief.  She is currently taking several medications including Topamax, sumatriptan, pregabalin (Lyrica) 300 mg, alprazolam, and Lexapro. For her asthma, she uses Advair and has a history of using Flonase and Atrovent nasal spray, though she is not currently using them. She mentions that her blood pressure was slightly elevated today, attributing it to stress.  She experienced a recent episode of abdominal pain that resolved after a bowel movement, which she attributes to improperly soaked beans. No new moles or skin changes are noted, and her legs are in good condition, though she has not been standing on them much recently.  In terms of her social history, she continues to work at [employer] and reports limited exercise, primarily walking around at work. She has been on leave recently, which she believes has helped her avoid illness during the winter season. She regularly visits the eye doctor and dentist, with her next eye  exam scheduled for March.      Patient Active Problem List   Diagnosis Date Noted   ANA positive 04/23/2023   Right upper quadrant abdominal pain 03/12/2023   Complex small left ovarian cyst on ultrasound 03/12/2023   Mild intermittent asthma with acute exacerbation 03/12/2023   Left lower quadrant abdominal pain 03/02/2023   Urinary frequency 03/02/2023   Iron deficiency anemia 03/02/2023   Pulmonary nodules 02/20/2023   Suspected sleep apnea 02/20/2023   Allergic rhinitis 02/20/2023   Pain of left calf 08/12/2022   Cellulitis of left lower extremity 08/12/2022   Edema, lower extremity 07/21/2022   SOB (shortness of breath) 07/21/2022   Hyperlipidemia 07/21/2022   Major depressive disorder, recurrent episode, moderate (HCC) 11/25/2021   Generalized anxiety disorder 11/25/2021   Depression with anxiety 10/28/2021   Asthma, chronic, unspecified asthma severity, uncomplicated 05/02/2021   Loud snoring 11/28/2019   Daytime sleepiness 11/28/2019   Other fatigue 11/28/2019   Primary osteoarthritis of left knee 08/02/2019   Rib pain 10/19/2018   Right knee pain 04/12/2017   Preventative health care 03/25/2016   Left ankle pain 01/14/2016   Left leg pain 04/28/2014   Pain of right calf 04/20/2014   Obesity (BMI 30-39.9) 01/06/2013   Thrush, oral 03/04/2011   TINEA CRURIS 02/05/2010   HYPOKALEMIA 04/26/2009   Primary hypertension 02/08/2008   LOW BACK PAIN SYNDROME 05/26/2007   LEG CRAMPS 05/14/2007   Edema 05/14/2007   Chronic migraine without aura, with status migrainosus 05/01/2007   ASTHMATIC BRONCHITIS, ACUTE 10/15/2006  Anxiety state 04/20/2006   RESTLESS LEG SYNDROME, HX OF 04/20/2006   Past Medical History:  Diagnosis Date   Anxiety    Arthritis    Asthma    Headache    migraines   History of hiatal hernia    repaired 30 yrs ago in [place]   Hypertension    MHA (microangiopathic hemolytic anemia) (HCC)    Past Surgical History:  Procedure Laterality  Date   APPENDECTOMY N/A    BACK SURGERY     LAPAROSCOPIC CHOLECYSTECTOMY N/A 03/1992   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   [facility] TOTAL [facility]NEE Right [facility]15/2021   [facility]   TOTAL ABDOMINAL HYSTERECTOMY Right    TUBAL LIGATION     WRIST ARTHROSCOPY Right 02/12/2018   Procedure: Right wrist arthroscopy, evaluation under anesthesia with debridement and repair as necessary and right carpal tunnel release;  Surgeon: Dominica Severin, MD;  Location: [facility] OR;  Service: Orthopedics;  Laterality: Right;  90 mins   Social History   Tobacco Use   Smoking status: Never   Smokeless tobacco: Never  Vaping Use   Vaping status: Never Used  Substance Use Topics   Alcohol use: Not Currently    Comment: rare   Drug use: No   Social History   Socioeconomic History   Marital status: Married    Spouse name: Not on file   Number of children: Not on file   Years of education: Not on file   Highest education level: Some college, no degree  Occupational History   Occupation: Administrator, 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[employer] [place]  Tobacco Use   Smoking status: Never   Smokeless tobacco: Never  Vaping Use   Vaping status: Never Used  Substance and Sexual Activity   Alcohol use: Not Currently    Comment: rare   Drug use: No   Sexual activity: Not on file  Other Topics Concern   Not on file  Social History Narrative   Exercise-- no   Social Drivers of Health   Financial Resource Strain: Low Risk  (03/02/2023)   Overall Financial Resource Strain (CARDIA)    Difficulty of Paying Living Expenses: Not very hard  Food Insecurity: No Food Insecurity (03/02/2023)   Hunger Vital Sign    Worried About Running Out of Food in the Last Year: Never true    Ran Out of Food in the Last Year: Never true  Transportation Needs: No Transportation Needs (03/02/2023)   PRAPARE - Administrator, Civil Service (Medical): No    Lack of Transportation (Non-Medical): No  Physical Activity:  Inactive (03/02/2023)   Exercise Vital Sign    Days of Exercise per Week: 0 days    Minutes of Exercise per Session: 10 min  Stress: No Stress Concern Present (03/02/2023)   [employer] of Occupational Health - Occupational Stress Questionnaire    Feeling of Stress : Only a little  Social Connections: Socially Integrated (03/02/2023)   Social Connection and Isolation Panel [NHANES]    Frequency of Communication with Friends and Family: More than three times a week    Frequency of Social Gatherings with Friends and Family: Once a week    Attends Religious Services: More than 4 times per year    Active Member of [employer] or Organizations: Yes    Attends Banker Meetings: 1 to 4 times per year    Marital Status: Married  Catering manager Violence: Not At Risk (03/19/2022)  Received from [facility]   HITS    Over the last 12 months how often did your partner physically hurt you?: Never    Over the last 12 months how often did your partner insult you or talk down to you?: Never    Over the last 12 months how often did your partner threaten you with physical harm?: Never    Over the last 12 months how often did your partner scream or curse at you?: Never   Family Status  Relation Name Status   Mother  Alive   Father  Deceased at age 38   Other  (Not Specified)  No partnership data on file   Family History  Problem Relation Age of Onset   Throat cancer Father    Kidney failure Father    Asthma Other    Diabetes Other    Hyperlipidemia Other    Allergies  Allergen Reactions   Fish Allergy Hives and Swelling   Penicillins Anaphylaxis, Hives and Other (See Comments)    Has patient had a PCN reaction causing immediate rash, facial/tongue/throat swelling, SOB or lightheadedness with hypotension: Unknown Has patient had a PCN reaction causing severe rash involving mucus membranes or skin necrosis: No Has patient had a PCN reaction that required  hospitalization: Yes Has patient had a PCN reaction occurring within the last 10 years: No If all of the above answers are "NO", then may proceed with Cephalosporin use.    Tizanidine Hcl Nausea And Vomiting, Rash and Shortness Of Breath   Iohexol Other (See Comments)     Desc: hives, sob, pt. needs 13 hr prep   01/16/05    Ivp Dye [Iodinated Contrast Media] Hives and Swelling   Morphine And Codeine Nausea And Vomiting      Review of Systems  Constitutional:  Negative for chills, fever and malaise/fatigue.  HENT:  Negative for congestion and hearing loss.   Eyes:  Negative for blurred vision and discharge.  Respiratory:  Negative for cough, sputum production and shortness of breath.   Cardiovascular:  Negative for chest pain, palpitations and leg swelling.  Gastrointestinal:  Negative for abdominal pain, blood in stool, constipation, diarrhea, heartburn, nausea and vomiting.  Genitourinary:  Negative for dysuria, frequency, hematuria and urgency.  Musculoskeletal:  Positive for back pain. Negative for falls and myalgias.  Skin:  Negative for rash.  Neurological:  Negative for dizziness, sensory change, loss of consciousness, weakness and headaches.  Endo/Heme/Allergies:  Negative for environmental allergies. Does not bruise/bleed easily.  Psychiatric/Behavioral:  Negative for depression and suicidal ideas. The patient is not nervous/anxious and does not have insomnia.       Objective:     BP (!) 138/90 (BP Location: Left Arm, Patient Position: Sitting, Cuff Size: Large)   Pulse 77   Temp 97.9 F (36.6 C) (Oral)   Resp 20   Ht 4\' 11"  (1.499 m)   Wt 228 lb 9.6 oz (103.7 kg)   SpO2 96%   BMI 46.17 kg/m  BP Readings from Last 3 Encounters:  05/08/23 (!) 138/90  04/23/23 130/80  04/09/23 120/80   Wt Readings from Last 3 Encounters:  05/08/23 228 lb 9.6 oz (103.7 kg)  04/23/23 228 lb (103.4 kg)  04/09/23 227 lb (103 kg)   SpO2 Readings from Last 3 Encounters:  05/08/23  96%  04/23/23 95%  04/09/23 94%      Physical Exam Vitals and nursing note reviewed.  Constitutional:      General:  She is not in acute distress.    Appearance: Normal appearance. She is well-developed.  HENT:     Head: Normocephalic and atraumatic.     Right Ear: Tympanic membrane, ear canal and external ear normal. There is no impacted cerumen.     Left Ear: Tympanic membrane, ear canal and external ear normal. There is no impacted cerumen.     Nose: Nose normal.     Mouth/Throat:     Mouth: Mucous membranes are moist.     Pharynx: Oropharynx is clear. No oropharyngeal exudate or posterior oropharyngeal erythema.  Eyes:     General: No scleral icterus.       Right eye: No discharge.        Left eye: No discharge.     Conjunctiva/sclera: Conjunctivae normal.     Pupils: Pupils are equal, round, and reactive to light.  Neck:     Thyroid: No thyromegaly or thyroid tenderness.     Vascular: No JVD.  Cardiovascular:     Rate and Rhythm: Normal rate and regular rhythm.     Heart sounds: Normal heart sounds. No murmur heard. Pulmonary:     Effort: Pulmonary effort is normal. No respiratory distress.     Breath sounds: Normal breath sounds.  Abdominal:     General: Bowel sounds are normal. There is no distension.     Palpations: Abdomen is soft. There is no mass.     Tenderness: There is no abdominal tenderness. There is no guarding or rebound.  Genitourinary:    Vagina: Normal.  Musculoskeletal:        General: Normal range of motion.     Cervical back: Normal range of motion and neck supple.     Right lower leg: No edema.     Left lower leg: No edema.  Lymphadenopathy:     Cervical: No cervical adenopathy.  Skin:    General: Skin is warm and dry.     Findings: No erythema or rash.  Neurological:     Mental Status: She is alert and oriented to person, place, and time.     Cranial Nerves: No cranial nerve deficit.     Deep Tendon Reflexes: Reflexes are normal and  symmetric.  Psychiatric:        Mood and Affect: Mood normal.        Behavior: Behavior normal.        Thought Content: Thought content normal.        Judgment: Judgment normal.      No results found for any visits on 05/08/23.  Last CBC Lab Results  Component Value Date   WBC 10.4 04/09/2023   HGB 14.4 04/09/2023   HCT 44.6 04/09/2023   MCV 87.8 04/09/2023   MCH 28.3 08/29/2021   RDW 15.5 04/09/2023   PLT 319.0 04/09/2023   Last metabolic panel Lab Results  Component Value Date   GLUCOSE 97 04/09/2023   NA 143 04/09/2023   K 3.4 (L) 04/09/2023   CL 107 04/09/2023   CO2 29 04/09/2023   BUN 12 04/09/2023   CREATININE 0.74 04/09/2023   GFR 88.96 04/09/2023   CALCIUM 9.2 04/09/2023   PROT 7.4 04/09/2023   ALBUMIN 4.4 04/09/2023   BILITOT 0.3 04/09/2023   ALKPHOS 72 04/09/2023   AST 24 04/09/2023   ALT 28 04/09/2023   ANIONGAP 7 08/29/2021   Last lipids Lab Results  Component Value Date   CHOL 158 03/02/2023   HDL 33.20 (L) 03/02/2023  LDLCALC 86 03/02/2023   LDLDIRECT 100.0 07/21/2022   TRIG 193.0 (H) 03/02/2023   CHOLHDL 5 03/02/2023   Last hemoglobin A1c Lab Results  Component Value Date   HGBA1C 6.6 (H) 07/28/2022   Last thyroid functions Lab Results  Component Value Date   TSH 1.26 03/02/2023   Last vitamin D No results found for: "25OHVITD2", "25OHVITD3", "VD25OH" Last vitamin B12 and Folate Lab Results  Component Value Date   VITAMINB12 383 12/01/2022   FOLATE 9.7 12/01/2022      The 10-year ASCVD risk score (Arnett DK, et al., 2019) is: 5%    Assessment & Plan:   Problem List Items Addressed This Visit       Unprioritized   LOW BACK PAIN SYNDROME   Relevant Medications   diclofenac (VOLTAREN) 75 MG EC tablet   methocarbamol (ROBAXIN-750) 750 MG tablet   pregabalin (LYRICA) 300 MG capsule   Generalized anxiety disorder   Relevant Medications   ALPRAZolam (XANAX) 0.5 MG tablet   escitalopram (LEXAPRO) 20 MG tablet    Preventative health care - Primary   Relevant Orders   CBC with Differential/Platelet   Comprehensive metabolic panel   Lipid panel   TSH   Depression with anxiety   Relevant Medications   ALPRAZolam (XANAX) 0.5 MG tablet   escitalopram (LEXAPRO) 20 MG tablet   Asthma, chronic, unspecified asthma severity, uncomplicated   Relevant Medications   albuterol (VENTOLIN HFA) 108 (90 Base) MCG/ACT inhaler   azelastine (ASTELIN) 0.1 % nasal spray   fluticasone-salmeterol (ADVAIR DISKUS) 250-50 MCG/ACT AEPB   montelukast (SINGULAIR) 10 MG tablet   Edema, lower extremity   Relevant Medications   furosemide (LASIX) 40 MG tablet   Left lower quadrant abdominal pain   Iron deficiency anemia   Relevant Orders   Iron, TIBC and Ferritin Panel   Mild intermittent asthma with acute exacerbation   Relevant Medications   albuterol (VENTOLIN HFA) 108 (90 Base) MCG/ACT inhaler   fluticasone-salmeterol (ADVAIR DISKUS) 250-50 MCG/ACT AEPB   montelukast (SINGULAIR) 10 MG tablet   Primary hypertension   Slightly elevated today Pt is in pain with her back      Relevant Medications   furosemide (LASIX) 40 MG tablet   Other Visit Diagnoses       Primary osteoarthritis of both knees       Relevant Medications   diclofenac (VOLTAREN) 75 MG EC tablet   methocarbamol (ROBAXIN-750) 750 MG tablet     Back muscle spasm       Relevant Medications   methocarbamol (ROBAXIN-750) 750 MG tablet     Hx of migraines       Relevant Medications   SUMAtriptan (IMITREX) 100 MG tablet   topiramate (TOPAMAX) 50 MG tablet     Morbid obesity (HCC)         Gastroesophageal reflux disease, unspecified whether esophagitis present       Relevant Medications   pantoprazole (PROTONIX) 20 MG tablet     Need for pneumococcal 20-valent conjugate vaccination       Relevant Orders   Pneumococcal conjugate vaccine 20-valent (Prevnar 20) (Completed)     .Assessment and Plan    Chronic Back Pain with Spinal  Stenosis Chronic back pain persists despite a history of laminectomy. The MRI from May 2023 reveals moderate spinal stenosis at L5-S1, a disc bulge, and severe stenosis at L4-S5. Physical therapy and an injection under x-ray guidance were ineffective. There is hesitancy about further surgery. The stenosis is at  a different level than the previous surgery, and another MRI may be needed. Surgery could alleviate symptoms but carries risks such as infection, nerve damage, and incomplete pain relief. Concerns about the cost and effectiveness of previous treatments were expressed. Refer to a spine surgeon for evaluation and consider a repeat MRI if recommended.  Hypertension Blood pressure is slightly elevated today, with reports of increased stress. Monitor blood pressure and discuss stress management techniques.  Asthma Currently using Advair with no acute exacerbations. Continue Advair as prescribed.  Medication Management Requests refills for Topamax, sumatriptan, pregabalin (300 mg), methocarbamol, Voltaren, alprazolam, and Lexapro. Refill all requested medications.  General Health Maintenance Due for a pneumonia vaccine and has a colonoscopy scheduled for March 20th. Received a flu shot in October 2023. Discussed eligibility for RSV vaccine at age 7. Administer the pneumonia vaccine, confirm flu shot status with the pharmacy, and proceed with the scheduled colonoscopy.  Follow-up Follow up with the spine surgeon. Monitor blood pressure and stress levels. Confirm flu shot status with the pharmacy.        Return in about 6 months (around 11/05/2023), or if symptoms worsen or fail to improve.    Donato Schultz, DO

## 2023-05-08 NOTE — Assessment & Plan Note (Signed)
 Slightly elevated today Pt is in pain with her back

## 2023-05-09 LAB — IRON,TIBC AND FERRITIN PANEL
%SAT: 16 % (ref 16–45)
Ferritin: 58 ng/mL (ref 16–232)
Iron: 41 ug/dL — ABNORMAL LOW (ref 45–160)
TIBC: 255 ug/dL (ref 250–450)

## 2023-05-13 ENCOUNTER — Encounter: Payer: Self-pay | Admitting: Family Medicine

## 2023-06-04 ENCOUNTER — Encounter: Payer: BC Managed Care – PPO | Admitting: Internal Medicine

## 2023-06-12 ENCOUNTER — Other Ambulatory Visit: Payer: Self-pay | Admitting: Family Medicine

## 2023-06-12 NOTE — Telephone Encounter (Signed)
 Requesting: Lyrica 225mg   Contract:05/02/21 UDS: 05/02/21 Last Visit: 05/08/23 Next Visit: 05/09/24 Last Refill: 04/13/23 #60 and 1RF   Please Advise

## 2023-06-15 ENCOUNTER — Other Ambulatory Visit: Payer: Self-pay | Admitting: Family Medicine

## 2023-06-15 ENCOUNTER — Encounter: Payer: Self-pay | Admitting: Family Medicine

## 2023-06-15 DIAGNOSIS — R051 Acute cough: Secondary | ICD-10-CM

## 2023-06-15 DIAGNOSIS — R109 Unspecified abdominal pain: Secondary | ICD-10-CM

## 2023-06-15 MED ORDER — PROMETHAZINE-DM 6.25-15 MG/5ML PO SYRP: 5.0000 mL | ORAL_SOLUTION | Freq: Four times a day (QID) | ORAL | 0 refills | Status: DC | PRN

## 2023-06-15 NOTE — Telephone Encounter (Signed)
 Requesting: tramadol Contract: n/a UDS: n/a Last Visit:05/08/23 Next Visit:n/a Last Refill:04/24/23  Please Advise

## 2023-06-16 MED ORDER — TRAMADOL HCL 50 MG PO TABS: 50.0000 mg | ORAL_TABLET | Freq: Three times a day (TID) | ORAL | 0 refills | Status: DC | PRN

## 2023-06-17 ENCOUNTER — Ambulatory Visit: Payer: BC Managed Care – PPO | Admitting: Emergency Medicine

## 2023-06-17 ENCOUNTER — Encounter (HOSPITAL_BASED_OUTPATIENT_CLINIC_OR_DEPARTMENT_OTHER): Payer: BC Managed Care – PPO

## 2023-06-19 ENCOUNTER — Ambulatory Visit (HOSPITAL_BASED_OUTPATIENT_CLINIC_OR_DEPARTMENT_OTHER)
Admission: RE | Admit: 2023-06-19 | Discharge: 2023-06-19 | Disposition: A | Discharge status: Home / Self Care | Payer: BC Managed Care – PPO | Source: Ambulatory Visit | Attending: Emergency Medicine | Admitting: Emergency Medicine

## 2023-06-19 DIAGNOSIS — R918 Other nonspecific abnormal finding of lung field: Secondary | ICD-10-CM | POA: Insufficient documentation

## 2023-06-22 ENCOUNTER — Encounter: Payer: Self-pay | Admitting: Family Medicine

## 2023-06-22 ENCOUNTER — Ambulatory Visit (INDEPENDENT_AMBULATORY_CARE_PROVIDER_SITE_OTHER): Admitting: Family Medicine

## 2023-06-22 ENCOUNTER — Other Ambulatory Visit: Payer: BC Managed Care – PPO

## 2023-06-22 VITALS — BP 126/80 | HR 84 | Temp 98.5°F | Resp 20 | Ht 59.0 in | Wt 235.2 lb

## 2023-06-22 DIAGNOSIS — R252 Cramp and spasm: Secondary | ICD-10-CM | POA: Diagnosis not present

## 2023-06-22 DIAGNOSIS — J0140 Acute pansinusitis, unspecified: Secondary | ICD-10-CM

## 2023-06-22 DIAGNOSIS — D509 Iron deficiency anemia, unspecified: Secondary | ICD-10-CM | POA: Diagnosis not present

## 2023-06-22 DIAGNOSIS — F411 Generalized anxiety disorder: Secondary | ICD-10-CM | POA: Diagnosis not present

## 2023-06-22 MED ORDER — ALPRAZOLAM 0.5 MG PO TABS
ORAL_TABLET | ORAL | 1 refills | Status: AC
Start: 2023-06-22 — End: ?

## 2023-06-22 MED ORDER — BENZONATATE 100 MG PO CAPS: 200.0000 mg | ORAL_CAPSULE | Freq: Three times a day (TID) | ORAL | 0 refills | Status: DC | PRN

## 2023-06-22 MED ORDER — DOXYCYCLINE HYCLATE 100 MG PO TABS: 100.0000 mg | ORAL_TABLET | Freq: Two times a day (BID) | ORAL | 0 refills | Status: DC

## 2023-06-22 NOTE — Patient Instructions (Signed)

## 2023-06-22 NOTE — Progress Notes (Signed)
 Established Patient Office Visit  Subjective   Patient ID: Deborah Watts, female    DOB: 11-06-1965  Age: 58 y.o. MRN: [id number]  Chief Complaint  Patient presents with   Cough    Pt states having cough, headache and left ear pain, x1 week     HPI Discussed the use of AI scribe software for clinical note transcription with the patient, who gave verbal consent to proceed.  History of Present Illness Deborah Watts is a 58 year old female who presents with ear pain and respiratory symptoms.  She has been experiencing ear pain and congestion with green sputum production for the past week. No fever is present. She has significant coughing, which worsens at night, causing her to wake up at 2 or 3 AM. The cough is associated with pain across her back and chest.  She notes difficulty breathing at times, despite using her inhalers. She uses Astelin nasal spray and takes Claritin twice daily. She describes a sensation of 'purring like a cat' when breathing, indicating possible wheezing. She has not performed a COVID test but has received a flu shot.  She reports a headache and states that her head is pounding. She mentions that nothing tastes good, including water and Diet Coke, possibly due to the green sputum. She has been prescribed Tessalon Perles for her cough and has been using cough syrup.  Her legs are swollen, which she attributes to not taking her furosemide (Lasix) regularly. She plans to double her dose for a few days. She experiences severe leg cramps and has been taking magnesium. Her iron levels were low previously, but she continues to take iron supplements without constipation.  Socially, she mentions attending a festival with her granddaughter and daughter, where she consumed an alcoholic beverage. She is cautious with alcohol intake due to her liver condition. She also discusses her recent job loss in February and her efforts to find new employment.   Patient Active Problem  List   Diagnosis Date Noted   ANA positive 04/23/2023   Right upper quadrant abdominal pain 03/12/2023   Complex small left ovarian cyst on ultrasound 03/12/2023   Mild intermittent asthma with acute exacerbation 03/12/2023   Left lower quadrant abdominal pain 03/02/2023   Urinary frequency 03/02/2023   Iron deficiency anemia 03/02/2023   Pulmonary nodules 02/20/2023   Suspected sleep apnea 02/20/2023   Allergic rhinitis 02/20/2023   Pain of left calf 08/12/2022   Cellulitis of left lower extremity 08/12/2022   Edema, lower extremity 07/21/2022   SOB (shortness of breath) 07/21/2022   Hyperlipidemia 07/21/2022   Major depressive disorder, recurrent episode, moderate (HCC) 11/25/2021   Generalized anxiety disorder 11/25/2021   Depression with anxiety 10/28/2021   Asthma, chronic, unspecified asthma severity, uncomplicated 05/02/2021   Loud snoring 11/28/2019   Daytime sleepiness 11/28/2019   Other fatigue 11/28/2019   Primary osteoarthritis of left knee 08/02/2019   Rib pain 10/19/2018   Right knee pain 04/12/2017   Preventative health care 03/25/2016   Left ankle pain 01/14/2016   Left leg pain 04/28/2014   Pain of right calf 04/20/2014   Obesity (BMI 30-39.9) 01/06/2013   Thrush, oral 03/04/2011   TINEA CRURIS 02/05/2010   HYPOKALEMIA 04/26/2009   Primary hypertension 02/08/2008   LOW BACK PAIN SYNDROME 05/26/2007   LEG CRAMPS 05/14/2007   Edema 05/14/2007   Chronic migraine without aura, with status migrainosus 05/01/2007   ASTHMATIC BRONCHITIS, ACUTE 10/15/2006   Anxiety state 04/20/2006   RESTLESS  LEG SYNDROME, HX OF 04/20/2006   Past Medical History:  Diagnosis Date   Anxiety    Arthritis    Asthma    Headache    migraines   History of hiatal hernia    repaired 30 yrs ago in [place]   Hypertension    MHA (microangiopathic hemolytic anemia) (HCC)    Past Surgical History:  Procedure Laterality Date   APPENDECTOMY N/A    BACK SURGERY     LAPAROSCOPIC  CHOLECYSTECTOMY N/A 03/1992   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   RE[facility]ACEMENT TOTAL KNEE Right [facility]/15/2021   [facility]   TOTAL ABDOMINAL HYSTERECTOMY Right    TUBAL LIGATION     WRIST ARTHROSCOPY Right 02/12/2018   Procedure: Right wrist arthroscopy, evaluation under anesthesia with debridement and repair as necessary and right carpal tunnel release;  Surgeon: Dominica Severin, MD;  Location: [facility] OR;  Service: Orthopedics;  Laterality: Right;  90 mins   Social History   Tobacco Use   Smoking status: Never   Smokeless tobacco: Never  Vaping Use   Vaping status: Never Used  Substance Use Topics   Alcohol use: Not Currently    Comment: rare   Drug use: No   Social History   Socioeconomic History   Marital status: Married    Spouse name: Not on file   Number of children: Not on file   Years of education: Not on file   Highest education level: Some college, no degree  Occupational History   Occupation: Administrator, 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[employer] [place]  Tobacco Use   Smoking status: Never   Smokeless tobacco: Never  Vaping Use   Vaping status: Never Used  Substance and Sexual Activity   Alcohol use: Not Currently    Comment: rare   Drug use: No   Sexual activity: Not on file  Other Topics Concern   Not on file  Social History Narrative   Exercise-- no   Social Drivers of Health   Financial Resource Strain: Low Risk  (03/02/2023)   Overall Financial Resource Strain (CARDIA)    Difficulty of Paying Living Expenses: Not very hard  Food Insecurity: No Food Insecurity (03/02/2023)   Hunger Vital Sign    Worried About Running Out of Food in the Last Year: Never true    Ran Out of Food in the Last Year: Never true  Transportation Needs: No Transportation Needs (03/02/2023)   PRAPARE - Administrator, Civil Service (Medical): No    Lack of Transportation (Non-Medical): No  Physical Activity: Unknown (03/02/2023)   Exercise Vital Sign    Days of Exercise  per Week: 0 days    Minutes of Exercise per Session: Not on file  Recent Concern: Physical Activity - Inactive (03/02/2023)   Exercise Vital Sign    Days of Exercise per Week: 0 days    Minutes of Exercise per Session: 10 min  Stress: No Stress Concern Present (03/02/2023)   [employer] of Occupational Health - Occupational Stress Questionnaire    Feeling of Stress : Only a little  Social Connections: Socially Integrated (03/02/2023)   Social Connection and Isolation Panel [NHANES]    Frequency of Communication with Friends and Family: More than three times a week    Frequency of Social Gatherings with Friends and Family: Once a week    Attends Religious Services: More than 4 times per year    Active Member of [employer] or Organizations: Yes  Attends Banker Meetings: 1 to 4 times per year    Marital Status: Married  Catering manager Violence: Not At Risk (03/19/2022)   Received [facility]   HITS    Over the last 12 months how often did your partner physically hurt you?: Never    Over the last 12 months how often did your partner insult you or talk down to you?: Never    Over the last 12 months how often did your partner threaten you with physical harm?: Never    Over the last 12 months how often did your partner scream or curse at you?: Never   Family Status  Relation Name Status   Mother  Alive   Father  Deceased at age 76   Other  (Not Specified)  No partnership data on file   Family History  Problem Relation Age of Onset   Throat cancer Father    Kidney failure Father    Asthma Other    Diabetes Other    Hyperlipidemia Other    Allergies  Allergen Reactions   Fish Allergy Hives and Swelling   Penicillins Anaphylaxis, Hives and Other (See Comments)    Has patient had a PCN reaction causing immediate rash, facial/tongue/throat swelling, SOB or lightheadedness with hypotension: Unknown Has patient had a PCN reaction causing severe  rash involving mucus membranes or skin necrosis: No Has patient had a PCN reaction that required hospitalization: Yes Has patient had a PCN reaction occurring within the last 10 years: No If all of the above answers are "NO", then may proceed with Cephalosporin use.    Tizanidine Hcl Nausea And Vomiting, Rash and Shortness Of Breath   Iohexol Other (See Comments)     Desc: hives, sob, pt. needs 13 hr prep   01/16/05    Ivp Dye [Iodinated Contrast Media] Hives and Swelling   Morphine And Codeine Nausea And Vomiting      Review of Systems  Constitutional:  Negative for fever.  HENT:  Positive for congestion, sinus pain and sore throat.   Eyes:  Negative for blurred vision.  Respiratory:  Positive for cough, sputum production and wheezing.   Cardiovascular:  Negative for chest pain and palpitations.  Gastrointestinal:  Negative for vomiting.  Musculoskeletal:  Negative for back pain.  Skin:  Negative for rash.  Neurological:  Negative for loss of consciousness and headaches.      Objective:     BP 126/80 (BP Location: Left Arm, Patient Position: Sitting, Cuff Size: Large)   Pulse 84   Temp 98.5 F (36.9 C) (Oral)   Resp 20   Ht 4\' 11"  (1.499 m)   Wt 235 lb 3.2 oz (106.7 kg)   SpO2 97%   BMI 47.50 kg/m  BP Readings from Last 3 Encounters:  06/22/23 126/80  05/08/23 (!) 138/90  04/23/23 130/80   Wt Readings from Last 3 Encounters:  06/22/23 235 lb 3.2 oz (106.7 kg)  05/08/23 228 lb 9.6 oz (103.7 kg)  04/23/23 228 lb (103.4 kg)   SpO2 Readings from Last 3 Encounters:  06/22/23 97%  05/08/23 96%  04/23/23 95%      Physical Exam Vitals and nursing note reviewed.  Constitutional:      General: She is not in acute distress.    Appearance: Normal appearance. She is well-developed.  HENT:     Head: Normocephalic and atraumatic.     Nose:     Right Sinus: Maxillary sinus tenderness and  frontal sinus tenderness present.     Left Sinus: Maxillary sinus tenderness and  frontal sinus tenderness present.  Eyes:     General: No scleral icterus.       Right eye: No discharge.        Left eye: No discharge.  Cardiovascular:     Rate and Rhythm: Normal rate and regular rhythm.     Heart sounds: No murmur heard. Pulmonary:     Effort: Pulmonary effort is normal. No respiratory distress.     Breath sounds: Wheezing present. No rhonchi.  Chest:     Chest Fitting: No tenderness.  Musculoskeletal:        General: Normal range of motion.     Cervical back: Normal range of motion and neck supple.     Right lower leg: No edema.     Left lower leg: No edema.  Skin:    General: Skin is warm and dry.  Neurological:     Mental Status: She is alert and oriented to person, place, and time.  Psychiatric:        Mood and Affect: Mood normal.        Behavior: Behavior normal.        Thought Content: Thought content normal.        Judgment: Judgment normal.      No results found for any visits on 06/22/23.  Last CBC Lab Results  Component Value Date   WBC 9.0 05/08/2023   HGB 14.4 05/08/2023   HCT 44.7 05/08/2023   MCV 89.5 05/08/2023   MCH 28.3 08/29/2021   RDW 15.1 05/08/2023   PLT 297.0 05/08/2023   Last metabolic panel Lab Results  Component Value Date   GLUCOSE 92 05/08/2023   NA 145 05/08/2023   K 4.6 05/08/2023   CL 108 05/08/2023   CO2 31 05/08/2023   BUN 12 05/08/2023   CREATININE 0.80 05/08/2023   GFR 80.97 05/08/2023   CALCIUM 9.0 05/08/2023   PROT 6.9 05/08/2023   ALBUMIN 4.2 05/08/2023   BILITOT 0.4 05/08/2023   ALKPHOS 86 05/08/2023   AST 27 05/08/2023   ALT 30 05/08/2023   ANIONGAP 7 08/29/2021   Last lipids Lab Results  Component Value Date   CHOL 150 05/08/2023   HDL 32.20 (L) 05/08/2023   LDLCALC 80 05/08/2023   LDLDIRECT 100.0 07/21/2022   TRIG 189.0 (H) 05/08/2023   CHOLHDL 5 05/08/2023   Last hemoglobin A1c Lab Results  Component Value Date   HGBA1C 6.6 (H) 07/28/2022   Last thyroid functions Lab Results   Component Value Date   TSH 0.85 05/08/2023   Last vitamin D No results found for: "25OHVITD2", "25OHVITD3", "VD25OH" Last vitamin B12 and Folate Lab Results  Component Value Date   VITAMINB12 383 12/01/2022   FOLATE 9.7 12/01/2022      The 10-year ASCVD risk score (Arnett DK, et al., 2019) is: 4.1%    Assessment & Plan:   Problem List Items Addressed This Visit       Unprioritized   Generalized anxiety disorder   Relevant Medications   ALPRAZolam (XANAX) 0.5 MG tablet   Iron deficiency anemia   Relevant Orders   Comprehensive metabolic panel with GFR   CBC with Differential/Platelet   Iron, TIBC and Ferritin Panel   Other Visit Diagnoses       Acute non-recurrent pansinusitis    -  Primary   Relevant Medications   benzonatate (TESSALON PERLES) 100 MG capsule   doxycycline (  VIBRA-TABS) 100 MG tablet     Leg cramps       Relevant Medications   benzonatate (TESSALON PERLES) 100 MG capsule   Other Relevant Orders   Comprehensive metabolic panel with GFR   CBC with Differential/Platelet   Iron, TIBC and Ferritin Panel     Assessment and Plan Assessment & Plan Upper respiratory infection   She presents with ear pain, cough, and green sputum for about a week, without fever. Significant discomfort includes headache and sore throat. Despite using inhalers and antihistamines like Claritin and Astelin, symptoms persist. The green sputum suggests a bacterial component. Due to her penicillin allergy, doxycycline is prescribed. Discussed potential weight gain with prednisone and decided to hold off unless symptoms do not improve. Continue Tessalon Perles for cough and use inhalers as needed. She should contact the office if there is no improvement in a couple of days.  Edema   She reports leg swelling and is taking furosemide (Lasix). Temporarily increase the furosemide dose for 2-3 days to manage edema, then return to the regular dose. Reports of leg cramps suggest possible  electrolyte imbalances from diuretic use. Check potassium levels and consider magnesium supplementation.  Iron deficiency anemia   She has low iron levels but normal ferritin, indicating effective supplementation. Denies constipation, a common side effect of iron supplements. Continue current iron supplementation and monitor iron levels.  Weight management   She is concerned about weight gain and follows a keto-friendly diet. Recommend using a smartphone app like My Fitness Pal to track caloric intake and activity levels. Encourage making smart dietary choices, particularly with salad dressings.  General Health Maintenance   She received a flu shot and is aware of the need to limit alcohol intake due to liver concerns. Colonoscopy and endoscopy were rescheduled due to insurance issues after job loss, but she currently has insurance coverage. Encourage moderation in alcohol consumption and ensure rescheduling of colonoscopy and endoscopy.    No follow-ups on file.    Donato Schultz, DO

## 2023-07-09 ENCOUNTER — Ambulatory Visit: Payer: BC Managed Care – PPO | Admitting: Emergency Medicine

## 2023-07-09 ENCOUNTER — Ambulatory Visit (INDEPENDENT_AMBULATORY_CARE_PROVIDER_SITE_OTHER): Payer: BC Managed Care – PPO | Admitting: Emergency Medicine

## 2023-07-09 ENCOUNTER — Encounter: Payer: Self-pay | Admitting: Emergency Medicine

## 2023-07-09 ENCOUNTER — Telehealth: Payer: Self-pay

## 2023-07-09 VITALS — BP 126/74 | HR 69 | Ht 59.0 in | Wt 242.0 lb

## 2023-07-09 DIAGNOSIS — R29818 Other symptoms and signs involving the nervous system: Secondary | ICD-10-CM | POA: Diagnosis not present

## 2023-07-09 DIAGNOSIS — R0602 Shortness of breath: Secondary | ICD-10-CM | POA: Diagnosis not present

## 2023-07-09 DIAGNOSIS — R918 Other nonspecific abnormal finding of lung field: Secondary | ICD-10-CM

## 2023-07-09 DIAGNOSIS — J45909 Unspecified asthma, uncomplicated: Secondary | ICD-10-CM

## 2023-07-09 LAB — PULMONARY FUNCTION TEST
DL/VA % pred: 131 %
DL/VA: 5.75 ml/min/mmHg/L
DLCO unc % pred: 119 %
DLCO unc: 20.37 ml/min/mmHg
FEF 25-75 Post: 2.52 L/s
FEF 25-75 Pre: 1.67 L/s
FEF2575-%Change-Post: 51 %
FEF2575-%Pred-Post: 118 %
FEF2575-%Pred-Pre: 78 %
FEV1-%Change-Post: 12 %
FEV1-%Pred-Post: 79 %
FEV1-%Pred-Pre: 70 %
FEV1-Post: 1.69 L
FEV1-Pre: 1.5 L
FEV1FVC-%Change-Post: 0 %
FEV1FVC-%Pred-Pre: 105 %
FEV6-%Change-Post: 12 %
FEV6-%Pred-Post: 77 %
FEV6-%Pred-Pre: 68 %
FEV6-Post: 2.05 L
FEV6-Pre: 1.81 L
FEV6FVC-%Pred-Post: 103 %
FEV6FVC-%Pred-Pre: 103 %
FVC-%Change-Post: 12 %
FVC-%Pred-Post: 74 %
FVC-%Pred-Pre: 66 %
FVC-Post: 2.05 L
FVC-Pre: 1.81 L
Post FEV1/FVC ratio: 83 %
Post FEV6/FVC ratio: 100 %
Pre FEV1/FVC ratio: 83 %
Pre FEV6/FVC Ratio: 100 %
RV % pred: 123 %
RV: 2.12 L
TLC % pred: 103 %
TLC: 4.47 L

## 2023-07-09 NOTE — Assessment & Plan Note (Signed)
 We will work on scheduling your home sleep study Follow with APP and about 2 months to review the sleep study

## 2023-07-09 NOTE — Progress Notes (Signed)
 Full pft performed today.

## 2023-07-09 NOTE — Progress Notes (Signed)
   Subjective:    Patient ID: Deborah Watts, female    DOB: Nov 22, 1965, 58 y.o.   MRN: [id number]  HPI  ROV 07/09/23 --follow-up visit for 58 year old woman, never smoker with a longstanding history of asthma on Advair and Singulair .  I saw her in December for this as well as new pulmonary nodules noted on CT scan of the chest October 2024.  There is also some question of possible obstructive sleep apnea at that visit and we ordered a home sleep study, not yet done.  She underwent pulmonary function testing today.  She has had some ups and downs - some DOE with walking hills, also w bending, carrying objects. She is using albuterol  about 1x a day. No pred since I last saw her, was treated w doxy in setting of URI since I last saw her. Using tessalon  prn. She is on PPI every day.   Pulmonary function testing performed today and reviewed by me, shows mixed obstruction and restriction with a positive bronchodilator response and correction of FEV1 almost to the normal range.  Lung volumes are normal.  Diffusion capacity is normal.  CT scan of the chest done 06/19/2023 reviewed by me, shows no pathologically enlarged mediastinal nodes, stable 5 mm right upper lobe nodule, stable 5 mm right middle lobe nodule, stable 3 mm right lower lobe nodule, resolution of subsolid subpleural nodule in the right lower lobe, new 3 mm subpleural nodule superior segment right lower lobe, small calcified granuloma right lower lobe.  No concerning changes   Review of Systems As per HPI      Objective:   Physical Exam  Vitals:   07/09/23 1005  BP: 126/74  Pulse: 69  SpO2: 95%  Weight: 242 lb (109.8 kg)  Height: 4\' 11"  (1.499 m)    Gen: Pleasant, obese woman, in no distress,  normal affect  ENT: No lesions,  mouth clear,  oropharynx clear, no postnasal drip  Neck: No JVD, no stridor  Lungs: No use of accessory muscles, shallow breaths decreased to both bases, no wheezes or crackles  Cardiovascular: RRR,  heart sounds normal, no murmur or gallops, no peripheral edema  Musculoskeletal: No deformities, no cyanosis or clubbing  Neuro: alert, awake, non focal  Skin: Warm, no lesions or rash     Assessment & Plan:  Asthma, chronic, unspecified asthma severity, uncomplicated We reviewed your pulmonary function testing today. Please continue your Advair twice a day.  Rinse and gargle after using. Use albuterol  2 puffs up to every 4 hours if needed for shortness of breath, chest tightness, wheezing.  We will give you a spacer to use with this.  Pulmonary nodules We reviewed your CT scan of the chest.  There are small stable pulmonary nodules present. We will plan to repeat your CT scan of the chest in April 2026. Follow Dr. Baldwin Levee in April 2026 to review your CT scan of the chest, sooner if you have any problems.  Suspected sleep apnea We will work on scheduling your home sleep study Follow with APP and about 2 months to review the sleep study  Time spent 30 minutes  Racheal Buddle, MD, PhD 07/09/2023, 1:30 PM [facility] [phone] or if no answer before 7:00PM call [phone] For any issues after 7:00PM please call eLink [phone]

## 2023-07-09 NOTE — Assessment & Plan Note (Signed)
 We reviewed your CT scan of the chest.  There are small stable pulmonary nodules present. We will plan to repeat your CT scan of the chest in April 2026. Follow Dr. Baldwin Levee in April 2026 to review your CT scan of the chest, sooner if you have any problems.

## 2023-07-09 NOTE — Telephone Encounter (Signed)
 HST was order and sent to [facility] 02/23/23 the Patient will need to call [facility] [phone]

## 2023-07-09 NOTE — Patient Instructions (Signed)
 Full pft performed today.

## 2023-07-09 NOTE — Assessment & Plan Note (Signed)
 We reviewed your pulmonary function testing today. Please continue your Advair twice a day.  Rinse and gargle after using. Use albuterol  2 puffs up to every 4 hours if needed for shortness of breath, chest tightness, wheezing.  We will give you a spacer to use with this.

## 2023-07-09 NOTE — Patient Instructions (Addendum)
 We reviewed your pulmonary function testing today. Please continue your Advair twice a day.  Rinse and gargle after using. Use albuterol  2 puffs up to every 4 hours if needed for shortness of breath, chest tightness, wheezing.  We will give you a spacer to use with this. We reviewed your CT scan of the chest.  There are small stable pulmonary nodules present. We will plan to repeat your CT scan of the chest in April 2026. We will work on scheduling your home sleep study Follow with APP and about 2 months to review the sleep study Follow Dr. Baldwin Levee in April 2026 to review your CT scan of the chest, sooner if you have any problems.

## 2023-07-09 NOTE — Telephone Encounter (Signed)
 Routing to [facility] to schedule HST as she has f/u to review results with NP 6/24.

## 2023-08-08 ENCOUNTER — Other Ambulatory Visit: Payer: Self-pay | Admitting: Family Medicine

## 2023-08-08 DIAGNOSIS — M170 Bilateral primary osteoarthritis of knee: Secondary | ICD-10-CM

## 2023-08-18 ENCOUNTER — Encounter: Admitting: Internal Medicine

## 2023-08-18 ENCOUNTER — Ambulatory Visit: Admitting: Family Medicine

## 2023-08-18 ENCOUNTER — Ambulatory Visit: Payer: Self-pay

## 2023-08-18 ENCOUNTER — Emergency Department (HOSPITAL_COMMUNITY)

## 2023-08-18 ENCOUNTER — Emergency Department (HOSPITAL_COMMUNITY)
Admission: EM | Admit: 2023-08-18 | Discharge: 2023-08-18 | Disposition: A | Discharge status: Home / Self Care | Attending: Emergency Medicine | Admitting: Emergency Medicine

## 2023-08-18 ENCOUNTER — Other Ambulatory Visit: Payer: Self-pay

## 2023-08-18 ENCOUNTER — Encounter (HOSPITAL_COMMUNITY): Payer: Self-pay

## 2023-08-18 ENCOUNTER — Telehealth: Payer: Self-pay

## 2023-08-18 DIAGNOSIS — Z7951 Long term (current) use of inhaled steroids: Secondary | ICD-10-CM | POA: Diagnosis not present

## 2023-08-18 DIAGNOSIS — R0602 Shortness of breath: Secondary | ICD-10-CM | POA: Diagnosis present

## 2023-08-18 DIAGNOSIS — J4521 Mild intermittent asthma with (acute) exacerbation: Secondary | ICD-10-CM | POA: Diagnosis not present

## 2023-08-18 DIAGNOSIS — D72829 Elevated white blood cell count, unspecified: Secondary | ICD-10-CM | POA: Insufficient documentation

## 2023-08-18 LAB — D-DIMER, QUANTITATIVE: D-Dimer, Quant: 0.41 ug{FEU}/mL (ref 0.00–0.50)

## 2023-08-18 LAB — BASIC METABOLIC PANEL WITH GFR
Anion gap: 11 (ref 5–15)
BUN: 11 mg/dL (ref 6–20)
CO2: 25 mmol/L (ref 22–32)
Calcium: 8.8 mg/dL — ABNORMAL LOW (ref 8.9–10.3)
Chloride: 103 mmol/L (ref 98–111)
Creatinine, Ser: 0.75 mg/dL (ref 0.44–1.00)
GFR, Estimated: >60 mL/min (ref >60)
Glucose, Bld: 103 mg/dL — ABNORMAL HIGH (ref 70–99)
Potassium: 3.5 mmol/L (ref 3.5–5.1)
Sodium: 139 mmol/L (ref 135–145)

## 2023-08-18 LAB — BRAIN NATRIURETIC PEPTIDE: B Natriuretic Peptide: 18.5 pg/mL (ref 0.0–100.0)

## 2023-08-18 LAB — CBC
HCT: 45.5 % (ref 36.0–46.0)
Hemoglobin: 14.4 g/dL (ref 12.0–15.0)
MCH: 28.7 pg (ref 26.0–34.0)
MCHC: 31.6 g/dL (ref 30.0–36.0)
MCV: 90.6 fL (ref 80.0–100.0)
Platelets: 290 10*3/uL (ref 150–400)
RBC: 5.02 MIL/uL (ref 3.87–5.11)
RDW: 14.8 % (ref 11.5–15.5)
WBC: 12.2 10*3/uL — ABNORMAL HIGH (ref 4.0–10.5)
nRBC: 0 % (ref 0.0–0.2)

## 2023-08-18 MED ORDER — DOXYCYCLINE HYCLATE 100 MG PO CAPS: 100.0000 mg | ORAL_CAPSULE | Freq: Two times a day (BID) | ORAL | 0 refills | Status: AC

## 2023-08-18 MED ORDER — PREDNISONE 10 MG PO TABS: 40.0000 mg | ORAL_TABLET | Freq: Every day | ORAL | 0 refills | Status: AC

## 2023-08-18 NOTE — ED Provider Triage Note (Signed)
 Emergency Medicine Provider Triage Evaluation Note  Deborah Watts , a 58 y.o. female  was evaluated in triage.  Pt complains of SOB.  Review of Systems  Positive: SOB, chest pain Negative: Fever, vomiting  Physical Exam  BP 134/60 (BP Location: Right Arm)   Pulse 83   Temp (!) 97.5 F (36.4 C) (Oral)   Resp 16   SpO2 95%  Gen:   Awake, no distress   Resp:  Normal effort  MSK:   Moves extremities without difficulty  Other:    Medical Decision Making  Medically screening exam initiated at 2:50 PM.  Appropriate orders placed.  Deborah Watts was informed that the remainder of the evaluation will be completed by another provider, this initial triage assessment does not replace that evaluation, and the importance of remaining in the ED until their evaluation is complete.  "I don't feel good". SOB, chest pain with cough. Brief period of right axillary pain yesterday, none today.   Mandy Second, PA-C 08/18/23 1451

## 2023-08-18 NOTE — Telephone Encounter (Signed)
   Copied from CRM [id number]. Topic: Clinical - Red Word Triage >> Aug 18, 2023  5:24 PM Baldo Levan wrote: Red Word that prompted transfer to Nurse Triage:  Patient made an appointment for today at 6:20pm for "Breathing and blood pressure high shaking " the nurse in the office called the patient and advised her to go to the ED instead of the appointment. Patient has been in the ED since one and has not been seen. Patient was requesting to speak to the nurse again because she has not been helped and her appointment in the office was cancelled since she went to the ED. Patient is not sure what to do.  ---------------------------------------------------- Answer Assessment - Initial Assessment Questions 1. REASON FOR CALL or QUESTION: "What is your reason for calling today?" or "How can I best help you?" or "What question do you have that I can help answer?"       -----  ---Patient calling from ER waiting room upset d/t long wait times.  Pt was informed that someone in the office would " call over to the ER to let them know" so she didn't have to wait.". Pt is now calling back to express frustration d/t having to wait 5 hrs.  This nurse confirmed she was in an ER and not UC. This nurse informed her that "calling the ER ahead of time" to decrease wait times was unfortunately misguided information. This nurse reassured her that despite the wait, the ER is the best place to be with the symptoms she is currently experiencing.  Patient verbalized understanding. No additional questions/concerns noted during the time of the call.  Protocols used: Information Only Call - No Triage-A-AH

## 2023-08-18 NOTE — ED Triage Notes (Signed)
 Pt arrives via POV. Pt reports sob with exertion and swelling to lower extremities over the past 3 days. She also reports a productive cough as well.

## 2023-08-18 NOTE — Telephone Encounter (Signed)
 Initial Comment Caller states that she is having trouble breathing, her hands are shaking, and her blood pressure is high at 154/90 and 139/68. Translation No Nurse Assessment Nurse: Marinus Sic, RN, Heather Date/Time (Eastern Time): 08/18/2023 12:32:26 PM Confirm and document reason for call. If symptomatic, describe symptoms. ---Caller states that she started feeling weird or off yesterday, she had some shoulder pain, but it went away and none today. She feels like she is SOB and her hands are shaking, most recent BP 139/68 about 90 mins ago. Does the patient have any new or worsening symptoms? ---Yes Will a triage be completed? ---Yes Related visit to physician within the last 2 weeks? ---No Does the PT have any chronic conditions? (i.e. diabetes, asthma, this includes High risk factors for pregnancy, etc.) ---Yes List chronic conditions. ---asthma Is this a behavioral health or substance abuse call? ---No Guidelines Guideline Title Affirmed Question Affirmed Notes Nurse Date/Time (Eastern Time) Breathing Difficulty Long-distance travel in past month (e.g., car, bus, train, plane; with trip lasting 6 or more hours) Standifer, RN, Pattie Borders 08/18/2023 12:36:43 PM PLEASE NOTE: All timestamps contained within this report are represented as [place] Standard Time. CONFIDENTIALTY NOTICE: This fax transmission is intended only for the addressee. It contains information that is legally privileged, confidential or otherwise protected from use or disclosure. If you are not the intended recipient, you are strictly prohibited from reviewing, disclosing, copying using or disseminating any of this information or taking any action in reliance on or regarding this information. If you have received this fax in error, please notify us  immediately by telephone so that we can arrange for its return to us . Phone: [phone], Toll-Free: [phone], Fax: [phone] [email or web address]_[email or web address] 1965/05/11 Page: 2  of 2 CallId: [id number] Disp. Time [facility] Time) Disposition Final User 08/18/2023 12:30:54 PM Send to Urgent Queue [facility] 08/18/2023 12:44:41 PM Go to ED Now Yes Marinus Sic, RN, Pattie Borders Final Disposition 08/18/2023 12:44:41 PM Go to ED Now Yes Standifer, RN, Earmon Glow Disagree/Comply Comply Caller Understands Yes PreDisposition Did not know what to do Care Advice Given Per Guideline GO TO ED NOW: * You need to be seen in the Emergency Department. CARE ADVICE given per Breathing Difficulty (Adult) guideline. Referrals Maryan Smalling - ED

## 2023-08-18 NOTE — ED Provider Notes (Signed)
 [place] EMERGENCY DEPARTMENT AT [facility] Provider Note   CSN: [id number] Arrival date & time: 08/18/23  1335     History Chief Complaint  Patient presents with   Shortness of Breath   Leg Swelling    Deborah Watts is a 58 y.o. female.  Patient with past history significant for asthma, depression, lower extremity edema, shortness of breath here with concerns of shortness of breath and leg swelling.  Reports she has been having shortness of breath.  With exertion and increased swelling to bilateral lower extremities for last 3 days.  She describes her cough is somewhat productive but reports that it is a clear color.  No fevers, chills or bodyaches at home.  No sick contacts.  Not currently on blood thinners and no history of PE or DVT.   Shortness of Breath      Home Medications Prior to Admission medications   Medication Sig Start Date End Date Taking? Authorizing Provider  doxycycline  (VIBRAMYCIN ) 100 MG capsule Take 1 capsule (100 mg total) by mouth 2 (two) times daily for 5 days. 08/18/23 08/23/23 Yes Chimamanda Siegfried A, PA-C  predniSONE  (DELTASONE ) 10 MG tablet Take 4 tablets (40 mg total) by mouth daily for 5 days. 08/18/23 08/23/23 Yes Iker Nuttall A, PA-C  albuterol  (PROVENTIL ) (2.5 MG/3ML) 0.083% nebulizer solution Take 3 mLs (2.5 mg total) by nebulization every 6 (six) hours as needed for wheezing or shortness of breath. 12/30/22   Estill Hemming, DO  albuterol  (VENTOLIN  HFA) 108 (90 Base) MCG/ACT inhaler Inhale 2 puffs into the lungs every 6 (six) hours as needed for wheezing or shortness of breath (Cough). 05/08/23   Crecencio Dodge, Adel Holt R, DO  ALPRAZolam  (XANAX ) 0.5 MG tablet TAKE 1 TABLET BY MOUTH THREE TIMES PER DAY AS NEEDED 06/22/23   Lowne Chase, Yvonne R, DO  azelastine  (ASTELIN ) 0.1 % nasal spray Place 1 spray into both nostrils 2 (two) times daily. Use in each nostril as directed 05/08/23   Crecencio Dodge, Adel Holt R, DO  benzonatate  (TESSALON  PERLES) 100 MG  capsule Take 2 capsules (200 mg total) by mouth 3 (three) times daily as needed for cough. 06/22/23   Roel Clarity R, DO  diclofenac  (VOLTAREN ) 75 MG EC tablet TAKE ONE TABLET BY MOUTH TWICE DAILY 08/11/23   Crecencio Dodge, Yvonne R, DO  doxycycline  (VIBRA -TABS) 100 MG tablet Take 1 tablet (100 mg total) by mouth 2 (two) times daily. 06/22/23   Estill Hemming, DO  escitalopram  (LEXAPRO ) 20 MG tablet Take 1 tablet (20 mg total) by mouth daily. 05/08/23   Estill Hemming, DO  fluticasone  (FLONASE ) 50 MCG/ACT nasal spray Place 2 sprays into both nostrils daily. 01/06/22   Lanetta Pion, NP  fluticasone -salmeterol (ADVAIR DISKUS) 250-50 MCG/ACT AEPB Inhale 1 puff into the lungs in the morning and at bedtime. 05/08/23   Estill Hemming, DO  furosemide  (LASIX ) 40 MG tablet Take 1 tablet (40 mg total) by mouth daily. 05/08/23   Lowne Chase, Yvonne R, DO  HYDROcodone -acetaminophen  (NORCO/VICODIN) 5-325 MG tablet Take 1 tablet by mouth every 6 (six) hours as needed for moderate pain (pain score 4-6). 03/27/23   Lowne Chase, Yvonne R, DO  ipratropium (ATROVENT ) 0.03 % nasal spray Place 2 sprays into both nostrils every 12 (twelve) hours. 10/11/21   Angelia Kelp, PA-C  Ketorolac  Tromethamine  (TORADOL  ORAL PO) Take 10 mg by mouth every 6 (six) hours as needed. 03/16/22   [provider]  Meclizine  HCl (TRAVEL SICKNESS) 25 MG CHEW CHEW 1 TABLET EVERY 6 HOURS AS NEEDED 01/07/23   Crecencio Dodge, Yvonne R, DO  methocarbamol  (ROBAXIN -750) 750 MG tablet Take 1 tablet (750 mg total) by mouth every 6 (six) hours as needed for muscle spasms. 05/08/23   Estill Hemming, DO  montelukast  (SINGULAIR ) 10 MG tablet Take 1 tablet (10 mg total) by mouth at bedtime. 05/08/23   Roel Clarity R, DO  NONFORMULARY OR COMPOUNDED ITEM Compression socks  20-30 mm hg #1 as directed 08/18/22   Crecencio Dodge, Candida Chalk, DO  nystatin  (MYCOSTATIN ) 100000 UNIT/ML suspension Take 5 mLs (500,000 Units total) by  mouth 4 (four) times daily. 01/27/19   Estill Hemming, DO  pantoprazole  (PROTONIX ) 20 MG tablet Take 1 tablet (20 mg total) by mouth daily. 05/08/23   Roel Clarity R, DO  pregabalin  (LYRICA ) 225 MG capsule TAKE ONE CAPSULE BY MOUTH TWICE DAILY Patient not taking: Reported on 07/09/2023 06/14/23   Webb, Padonda B, FNP  pregabalin  (LYRICA ) 300 MG capsule Take 1 capsule (300 mg total) by mouth 2 (two) times daily. 05/08/23   Lowne Chase, Yvonne R, DO  promethazine -dextromethorphan (PROMETHAZINE -DM) 6.25-15 MG/5ML syrup Take 5 mLs by mouth 4 (four) times daily as needed. 06/15/23   Lowne Chase, Yvonne R, DO  SUMAtriptan  (IMITREX ) 100 MG tablet TAKE 1 TABLET BY MOUTH AS NEEDED FOR MIGRAINE. REPEAT IN 2 HOURS IF NEEDED 05/08/23   Crecencio Dodge, Yvonne R, DO  topiramate  (TOPAMAX ) 50 MG tablet Take 1 tablet (50 mg total) by mouth 2 (two) times daily. 05/08/23   Lowne Chase, Yvonne R, DO  traMADol  (ULTRAM ) 50 MG tablet Take 1 tablet (50 mg total) by mouth every 8 (eight) hours as needed. 06/16/23   Lowne Chase, Yvonne R, DO  triamcinolone  cream (KENALOG ) 0.1 % Apply 1 Application topically 2 (two) times daily. 01/27/22   Everlina Hock, NP      Allergies    Fish allergy, Penicillins, Tizanidine hcl, Iohexol , Ivp dye [iodinated contrast media], and Morphine  and codeine     Review of Systems   Review of Systems  Respiratory:  Positive for shortness of breath.   All other systems reviewed and are negative.   Physical Exam Updated Vital Signs BP (!) 101/58 (BP Location: Right Arm)   Pulse 68   Temp 98.1 F (36.7 C) (Oral)   Resp 18   SpO2 97%  Physical Exam Vitals and nursing note reviewed.  Constitutional:      General: She is not in acute distress.    Appearance: She is well-developed.  HENT:     Head: Normocephalic and atraumatic.  Eyes:     Conjunctiva/sclera: Conjunctivae normal.  Cardiovascular:     Rate and Rhythm: Normal rate and regular rhythm.     Heart sounds: No murmur  heard. Pulmonary:     Effort: Pulmonary effort is normal. No respiratory distress.     Breath sounds: Wheezing present. No decreased breath sounds, rhonchi or rales.  Abdominal:     Palpations: Abdomen is soft.     Tenderness: There is no abdominal tenderness.  Musculoskeletal:        General: No swelling.     Cervical back: Neck supple.  Skin:    General: Skin is warm and dry.     Capillary Refill: Capillary refill takes less than 2 seconds.  Neurological:     Mental Status: She is alert.  Psychiatric:  Mood and Affect: Mood normal.     ED Results / Procedures / Treatments   Labs (all labs ordered are listed, but only abnormal results are displayed) Labs Reviewed  BASIC METABOLIC PANEL WITH GFR - Abnormal; Notable for the following components:      Result Value   Glucose, Bld 103 (*)    Calcium 8.8 (*)    All other components within normal limits  CBC - Abnormal; Notable for the following components:   WBC 12.2 (*)    All other components within normal limits  BRAIN NATRIURETIC PEPTIDE  D-DIMER, QUANTITATIVE (NOT AT [facility])    EKG None  Radiology DG Chest 2 View Result Date: 08/18/2023 CLINICAL DATA:  Shortness of breath and chest pain EXAM: CHEST - 2 VIEW COMPARISON:  Chest x-ray 12/31/2022 FINDINGS: The heart size and mediastinal contours are within normal limits. Both lungs are clear. The visualized skeletal structures are unremarkable. IMPRESSION: No active cardiopulmonary disease. Electronically Signed   By: Tyron Gallon M.D.   On: 08/18/2023 17:52    Procedures Procedures    Medications Ordered in ED Medications - No data to display  ED Course/ Medical Decision Making/ A&P Clinical Course as of 08/18/23 2347  Tue Aug 18, 2023  1915 Resp: 16 [OZ]    Clinical Course User Index [OZ] Concetta Dee, PA-C                                 Medical Decision Making Amount and/or Complexity of Data Reviewed Labs: ordered. Radiology:  ordered.  Risk Prescription drug management.   This patient presents to the ED for concern of shortness of breath, leg swelling. Differential diagnosis includes asthma exacerbation, pneumonia, bronchitis, DVT, PE   Lab Tests:  I Ordered, and personally interpreted labs.  The pertinent results include: CBC with mild leukocytosis at 12.2, BMP unremarkable, BNP -18.5, D-dimer negative at 0.41.   Imaging Studies ordered:  I ordered imaging studies including chest x-ray I independently visualized and interpreted imaging which showed no acute cardiopulmonary process. I agree with the radiologist interpretation   Problem List / ED Course:  Patient with past history significant for asthma, depression, low extremity edema, shortness of breath here with concerns of shortness of breath.  She reports that she has been having the symptoms ongoing for the last several days with notable worsening in the last 3 days.  Denies any recent fever, chills or bodyaches.  She reports that her cough is somewhat productive but describes the color as a white color. On exam, patient has trace lower extremity swelling.  In no obvious acute respiratory distress with normal respirations and saturating well at room air.  Primary concern at this time is likely for asthma however we will proceed with further evaluation including basic labs for evaluation of symptoms. Lab workup shows mild leukocytosis at 12.2 with a unremarkable BMP at 18.5 and D-dimer at 0.41.  Doubtful CHF or PE at this time.  Patient in no respiratory distress at this time I reviewed with somewhat productive coughing and generally poor health, will discharge home with a course of prednisone  and doxycycline  for prevention of any worsening symptoms.  Doubt pneumonia at this time with a normal chest x-ray.  Discharge patient home in stable condition with plans for outpatient follow-up.   Final Clinical Impression(s) / ED Diagnoses Final diagnoses:  Mild  intermittent asthma with exacerbation    Rx / DC  Orders ED Discharge Orders          Ordered    predniSONE  (DELTASONE ) 10 MG tablet  Daily        08/18/23 2246    doxycycline  (VIBRAMYCIN ) 100 MG capsule  2 times daily        08/18/23 2246              Jaquavious Mercer A, PA-C 08/18/23 2348    Scarlette Currier, MD 08/21/23 2256

## 2023-08-18 NOTE — Telephone Encounter (Signed)
 Pt in ED.

## 2023-08-18 NOTE — Discharge Instructions (Signed)
 You are seen in the emergency department today with concerns of shortness of breath and leg swelling.  Your labs and imaging thankfully reassuring.  I do suspect your symptoms are likely due to your asthma that is somewhat aggravated.  I sent a prescription for prednisone  doxycycline  to her pharmacy.  Please take this as prescribed.  Return to the emergency department for any concerns of new or worsening symptoms.

## 2023-08-28 ENCOUNTER — Telehealth: Payer: Self-pay

## 2023-08-28 ENCOUNTER — Ambulatory Visit (INDEPENDENT_AMBULATORY_CARE_PROVIDER_SITE_OTHER): Admitting: Family Medicine

## 2023-08-28 ENCOUNTER — Encounter: Payer: Self-pay | Admitting: Family Medicine

## 2023-08-28 ENCOUNTER — Other Ambulatory Visit: Payer: Self-pay | Admitting: Family Medicine

## 2023-08-28 VITALS — BP 120/80 | HR 76 | Temp 98.2°F | Resp 18 | Ht 59.0 in | Wt 233.4 lb

## 2023-08-28 DIAGNOSIS — J4541 Moderate persistent asthma with (acute) exacerbation: Secondary | ICD-10-CM

## 2023-08-28 DIAGNOSIS — F411 Generalized anxiety disorder: Secondary | ICD-10-CM | POA: Diagnosis not present

## 2023-08-28 DIAGNOSIS — J0140 Acute pansinusitis, unspecified: Secondary | ICD-10-CM | POA: Diagnosis not present

## 2023-08-28 DIAGNOSIS — D509 Iron deficiency anemia, unspecified: Secondary | ICD-10-CM

## 2023-08-28 DIAGNOSIS — R252 Cramp and spasm: Secondary | ICD-10-CM

## 2023-08-28 MED ORDER — ALPRAZOLAM 0.5 MG PO TABS
ORAL_TABLET | ORAL | 1 refills | Status: DC
Start: 2023-08-28 — End: 2023-08-28

## 2023-08-28 MED ORDER — ALPRAZOLAM 0.5 MG PO TABS: ORAL_TABLET | ORAL | 1 refills | Status: DC

## 2023-08-28 MED ORDER — TRELEGY ELLIPTA 100-62.5-25 MCG/ACT IN AEPB
1.0000 | INHALATION_SPRAY | Freq: Every day | RESPIRATORY_TRACT | 11 refills | Status: DC
Start: 2023-08-28 — End: 2023-10-06

## 2023-08-28 MED ORDER — CLOTRIMAZOLE-BETAMETHASONE 1-0.05 % EX CREA: 1.0000 | TOPICAL_CREAM | Freq: Every day | CUTANEOUS | 0 refills | Status: AC

## 2023-08-28 MED ORDER — SULFAMETHOXAZOLE-TRIMETHOPRIM 800-160 MG PO TABS: 1.0000 | ORAL_TABLET | Freq: Two times a day (BID) | ORAL | 0 refills | Status: DC

## 2023-08-28 MED ORDER — FLUOXETINE HCL 20 MG PO CAPS: 20.0000 mg | ORAL_CAPSULE | Freq: Every day | ORAL | 3 refills | Status: DC

## 2023-08-28 NOTE — Progress Notes (Signed)
 Established Patient Office Visit  Subjective   Patient ID: Deborah Watts, female    DOB: 1965-07-30  Age: 58 y.o. MRN: [id number]  Chief Complaint  Patient presents with   ER Follow up    HPI Discussed the use of AI scribe software for clinical note transcription with the patient, who gave verbal consent to proceed.  History of Present Illness   Deborah Watts is a 58 year old female with asthma and hypertension who presents with asthma exacerbation and high blood pressure.  She is experiencing an asthma exacerbation with chest tightness and pain radiating across her back. Her inhalers, including albuterol , have not been effective. She did not continue prednisone  due to side effects such as headache and increased blood pressure, having taken it for only one day at a dose of 10 mg. She continues to use her nose spray daily and takes Allegra in the morning and Claritin at night for allergies.  She reports high blood pressure, with a recent reading of 154/unknown, and experiences shaking in her hands, which she suspects may be related to albuterol  use.  She describes ongoing sinus congestion with bloody discharge, which she feels was not adequately addressed during her recent emergency department visit. She continues to experience headaches and congestion, which she likens to a sinus infection.  She experiences severe leg cramps that cause 'toe curling' and significant pain. She takes magnesium supplements but finds them ineffective. She suspects her potassium levels may be contributing to the cramps, as her recent blood work indicated potassium levels at the lower end of normal. She consumes bananas and drinks Body Armor for electrolytes.  She has a non-healing wound in the crease of her skin, which she attributes to her weight. She has tried various topical treatments including Aquaphor, Desitin, and Silvadene, but the wound remains sore and painful.  She is experiencing a 'funk' and feels  her current medication, Lexapro , is no longer effective. She is out of work but tries to stay active by gardening and helping her mother with outdoor tasks. She is concerned about her weight, currently at 233 pounds, and is making efforts to eat healthily, avoiding chips and consuming more salads and vegetables.      Patient Active Problem List   Diagnosis Date Noted   ANA positive 04/23/2023   Right upper quadrant abdominal pain 03/12/2023   Complex small left ovarian cyst on ultrasound 03/12/2023   Left lower quadrant abdominal pain 03/02/2023   Urinary frequency 03/02/2023   Iron deficiency anemia 03/02/2023   Pulmonary nodules 02/20/2023   Suspected sleep apnea 02/20/2023   Allergic rhinitis 02/20/2023   Pain of left calf 08/12/2022   Cellulitis of left lower extremity 08/12/2022   Edema, lower extremity 07/21/2022   SOB (shortness of breath) 07/21/2022   Hyperlipidemia 07/21/2022   Major depressive disorder, recurrent episode, moderate (HCC) 11/25/2021   Generalized anxiety disorder 11/25/2021   Depression with anxiety 10/28/2021   Asthma, chronic, unspecified asthma severity, uncomplicated 05/02/2021   Loud snoring 11/28/2019   Daytime sleepiness 11/28/2019   Other fatigue 11/28/2019   Primary osteoarthritis of left knee 08/02/2019   Rib pain 10/19/2018   Right knee pain 04/12/2017   Preventative health care 03/25/2016   Left ankle pain 01/14/2016   Left leg pain 04/28/2014   Pain of right calf 04/20/2014   Obesity (BMI 30-39.9) 01/06/2013   Thrush, oral 03/04/2011   TINEA CRURIS 02/05/2010   HYPOKALEMIA 04/26/2009   Primary hypertension 02/08/2008  LOW BACK PAIN SYNDROME 05/26/2007   LEG CRAMPS 05/14/2007   Edema 05/14/2007   Chronic migraine without aura, with status migrainosus 05/01/2007   ASTHMATIC BRONCHITIS, ACUTE 10/15/2006   Anxiety state 04/20/2006   RESTLESS LEG SYNDROME, HX OF 04/20/2006   Past Medical History:  Diagnosis Date   Anxiety    Arthritis     Asthma    Headache    migraines   History of hiatal hernia    repaired 30 yrs ago in [place]   Hypertension    MHA (microangiopathic hemolytic anemia) (HCC)    Past Surgical History:  Procedure Laterality Date   APPENDECTOMY N/A    BACK SURGERY     LAPAROSCOPIC CHOLECYSTECTOMY N/A 03/1992   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   RE[facility]AC[facility] TOTAL KNEE Right [facility]/15/2021   [facility]   TOTAL ABDOMINAL HYSTERECTOMY Right    TUBAL LIGATION     WRIST ARTHROSCOPY Right 02/12/2018   Procedure: Right wrist arthroscopy, evaluation under anesthesia with debridement and repair as necessary and right carpal tunnel release;  Surgeon: Ronn Cohn, MD;  Location: [facility] OR;  Service: Orthopedics;  Laterality: Right;  90 mins   Social History   Tobacco Use   Smoking status: Never   Smokeless tobacco: Never  Vaping Use   Vaping status: Never Used  Substance Use Topics   Alcohol use: Not Currently    Comment: rare   Drug use: No   Social History   Socioeconomic History   Marital status: Married    Spouse name: Not on file   Number of children: Not on file   Years of education: Not on file   Highest education level: Some college, no degree  Occupational History   Occupation: Administrator, 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[employer] [place]  Tobacco Use   Smoking status: Never   Smokeless tobacco: Never  Vaping Use   Vaping status: Never Used  Substance and Sexual Activity   Alcohol use: Not Currently    Comment: rare   Drug use: No   Sexual activity: Not on file  Other Topics Concern   Not on file  Social History Narrative   Exercise-- no   Social Drivers of Health   Financial Resource Strain: Low Risk  (08/27/2023)   Overall Financial Resource Strain (CARDIA)    Difficulty of Paying Living Expenses: Not very hard  Food Insecurity: No Food Insecurity (08/27/2023)   Hunger Vital Sign    Worried About Running Out of Food in the Last Year: Never true    Ran Out of Food in the Last  Year: Never true  Transportation Needs: No Transportation Needs (08/27/2023)   PRAPARE - Administrator, Civil Service (Medical): No    Lack of Transportation (Non-Medical): No  Physical Activity: Insufficiently Active (08/27/2023)   Exercise Vital Sign    Days of Exercise per Week: 1 day    Minutes of Exercise per Session: 10 min  Stress: Stress Concern Present (08/27/2023)   [employer] of Occupational Health - Occupational Stress Questionnaire    Feeling of Stress: To some extent  Social Connections: Socially Integrated (08/27/2023)   Social Connection and Isolation Panel    Frequency of Communication with Friends and Family: More than three times a week    Frequency of Social Gatherings with Friends and Family: Twice a week    Attends Religious Services: More than 4 times per year    Active Member of [employer] or Organizations: Yes  Attends Banker Meetings: Patient declined    Marital Status: Married  Catering manager Violence: Not At Risk (03/19/2022)   Received from [facility]   HITS    Over the last 12 months how often did your partner physically hurt you?: Never    Over the last 12 months how often did your partner insult you or talk down to you?: Never    Over the last 12 months how often did your partner threaten you with physical harm?: Never    Over the last 12 months how often did your partner scream or curse at you?: Never   Family Status  Relation Name Status   Mother  Alive   Father  Deceased at age 41   Other  (Not Specified)  No partnership data on file   Family History  Problem Relation Age of Onset   Throat cancer Father    Kidney failure Father    Asthma Other    Diabetes Other    Hyperlipidemia Other    Allergies  Allergen Reactions   Fish Allergy Hives and Swelling   Penicillins Anaphylaxis, Hives and Other (See Comments)    Has patient had a PCN reaction causing immediate rash, facial/tongue/throat swelling, SOB or  lightheadedness with hypotension: Unknown Has patient had a PCN reaction causing severe rash involving mucus membranes or skin necrosis: No Has patient had a PCN reaction that required hospitalization: Yes Has patient had a PCN reaction occurring within the last 10 years: No If all of the above answers are NO, then may proceed with Cephalosporin use.    Tizanidine Hcl Nausea And Vomiting, Rash and Shortness Of Breath   Iohexol  Other (See Comments)     Desc: hives, sob, pt. needs 13 hr prep   01/16/05    Ivp Dye [Iodinated Contrast Media] Hives and Swelling   Morphine  And Codeine  Nausea And Vomiting      Review of Systems  Constitutional:  Negative for fever and malaise/fatigue.  HENT:  Negative for congestion.   Eyes:  Negative for blurred vision.  Respiratory:  Positive for wheezing. Negative for cough and shortness of breath.   Cardiovascular:  Negative for chest pain, palpitations and leg swelling.  Gastrointestinal:  Negative for vomiting.  Musculoskeletal:  Negative for back pain.  Skin:  Negative for rash.  Neurological:  Negative for loss of consciousness and headaches.  Psychiatric/Behavioral:  Positive for depression. The patient is nervous/anxious.       Objective:     BP 120/80 (BP Location: Left Arm, Patient Position: Sitting, Cuff Size: Large)   Pulse 76   Temp 98.2 F (36.8 C) (Oral)   Resp 18   Ht 4' 11 (1.499 m)   Wt 233 lb 6.4 oz (105.9 kg)   SpO2 96%   BMI 47.14 kg/m  BP Readings from Last 3 Encounters:  08/28/23 120/80  08/18/23 (!) 101/58  07/09/23 126/74   Wt Readings from Last 3 Encounters:  08/28/23 233 lb 6.4 oz (105.9 kg)  07/09/23 242 lb (109.8 kg)  07/09/23 242 lb (109.8 kg)   SpO2 Readings from Last 3 Encounters:  08/28/23 96%  08/18/23 97%  07/09/23 95%      Physical Exam Vitals and nursing note reviewed.  Constitutional:      General: She is not in acute distress.    Appearance: Normal appearance. She is well-developed.   HENT:     Head: Normocephalic and atraumatic.   Eyes:     General: No  scleral icterus.       Right eye: No discharge.        Left eye: No discharge.    Cardiovascular:     Rate and Rhythm: Normal rate and regular rhythm.     Heart sounds: No murmur heard. Pulmonary:     Effort: Pulmonary effort is normal. No tachypnea, respiratory distress or retractions.     Breath sounds: Decreased air movement present. Decreased breath sounds present. No wheezing.   Musculoskeletal:        General: Normal range of motion.     Cervical back: Normal range of motion and neck supple.     Right lower leg: No edema.     Left lower leg: No edema.   Skin:    General: Skin is warm and dry.   Neurological:     Mental Status: She is alert and oriented to person, place, and time.   Psychiatric:        Mood and Affect: Mood normal.        Behavior: Behavior normal.        Thought Content: Thought content normal.        Judgment: Judgment normal.      No results found for any visits on 08/28/23.  Last CBC Lab Results  Component Value Date   WBC 12.2 (H) 08/18/2023   HGB 14.4 08/18/2023   HCT 45.5 08/18/2023   MCV 90.6 08/18/2023   MCH 28.7 08/18/2023   RDW 14.8 08/18/2023   PLT 290 08/18/2023   Last metabolic panel Lab Results  Component Value Date   GLUCOSE 103 (H) 08/18/2023   NA 139 08/18/2023   K 3.5 08/18/2023   CL 103 08/18/2023   CO2 25 08/18/2023   BUN 11 08/18/2023   CREATININE 0.75 08/18/2023   GFRNONAA >60 08/18/2023   CALCIUM 8.8 (L) 08/18/2023   PROT 6.9 05/08/2023   ALBUMIN 4.2 05/08/2023   BILITOT 0.4 05/08/2023   ALKPHOS 86 05/08/2023   AST 27 05/08/2023   ALT 30 05/08/2023   ANIONGAP 11 08/18/2023   Last lipids Lab Results  Component Value Date   CHOL 150 05/08/2023   HDL 32.20 (L) 05/08/2023   LDLCALC 80 05/08/2023   LDLDIRECT 100.0 07/21/2022   TRIG 189.0 (H) 05/08/2023   CHOLHDL 5 05/08/2023   Last hemoglobin A1c Lab Results  Component Value  Date   HGBA1C 6.6 (H) 07/28/2022   Last thyroid  functions Lab Results  Component Value Date   TSH 0.85 05/08/2023   Last vitamin D No results found for: Lucetta Russel, VD25OH Last vitamin B12 and Folate Lab Results  Component Value Date   VITAMINB12 383 12/01/2022   FOLATE 9.7 12/01/2022      The 10-year ASCVD risk score (Arnett DK, et al., 2019) is: 4.1%    Assessment & Plan:   Problem List Items Addressed This Visit       Unprioritized   Generalized anxiety disorder   Relevant Medications   ALPRAZolam  (XANAX ) 0.5 MG tablet   FLUoxetine (PROZAC) 20 MG capsule   Iron deficiency anemia   Relevant Orders   CBC with Differential/Platelet   Iron, TIBC and Ferritin Panel   Magnesium   Other Visit Diagnoses       Moderate persistent asthma with exacerbation    -  Primary   Relevant Medications   Fluticasone -Umeclidin-Vilant (TRELEGY ELLIPTA) 100-62.5-25 MCG/ACT AEPB     Acute non-recurrent pansinusitis       Relevant Medications  sulfamethoxazole-trimethoprim  (BACTRIM DS) 800-160 MG tablet     Leg cramps       Relevant Orders   Comprehensive metabolic panel with GFR   Iron, TIBC and Ferritin Panel   Magnesium     Assessment and Plan    Asthma exacerbation   She experienced an asthma exacerbation 10 days ago with chest tightness and back pain. Inhalers were ineffective, and prednisone  was discontinued after one day due to headaches and concerns about weight gain. Currently, she uses albuterol  and Advair but reports persistent mild chest tightness. Transitioning to Trelegy to improve symptom control. Discontinue Advair and prescribe Trelegy inhaler.  Sinus congestion with epistaxis   She reports ongoing sinus congestion and bloody nasal discharge, not addressed during a recent ED visit. She uses Flonase  and takes Allegra and Claritin for allergies. Bactrim is prescribed for a suspected sinus infection. Continue Flonase , Allegra, and  Claritin.  Non-healing wound   She has a small, non-healing wound in the groin area, present for weeks and very sore. Various topical treatments were ineffective. Bactrim is prescribed for potential infection. Consider using a Band-Aid to prevent clothing friction and prescribe a cream for the wound.  Leg cramps   She experiences severe leg cramps, possibly related to low potassium levels. She takes magnesium supplements and Lasix , which may contribute to potassium depletion. Potassium level was borderline low during a recent hospital visit. Check potassium levels and consider supplementation if low. Advise trying pickle juice or mustard for cramps   Hypertension   Her blood pressure was elevated at 154/unknown during the asthma exacerbation, likely due to stress and difficulty breathing. This level is not severely high given the circumstances.  Depression   She reports that Lexapro  is no longer effective and feels in a funk. Currently out of work, which may contribute to mood. Transitioning to Prozac with an overlap period to ensure efficacy. Discontinue Lexapro  and prescribe Prozac, allowing overlap for a few weeks.  General Health Maintenance   She is attempting to lose weight and improve her diet by eating more salads, vegetables, and fruits. Engages in physical activity by gardening and helping her mother with yard work. Discussed the importance of protein intake. Encourage continued healthy eating and physical activity and monitor weight loss progress.  Follow-up   Upcoming endoscopy and colonoscopy are scheduled for next month to investigate potential sources of anemia. Monitoring of potassium and iron levels to ensure adequate supplementation and response to new medications and treatments. Recheck potassium and iron levels and monitor response to new medications and treatments.        No follow-ups on file.    Anissia Wessells R Lowne Chase, DO

## 2023-08-28 NOTE — Telephone Encounter (Signed)
 Copied from CRM [id number]. Topic: Clinical - Prescription Issue >> Aug 28, 2023 12:13 PM Chuck Crater wrote: Reason for CRM: Carolyn Cisco with [facility] stated that they dont have xanax  in stock and patient wants to know if it can be sent [facility] [place]. Bernis Brisker stated that patient Trelegy copay is $600 and patient wants to know if something else can be called in. Replacement for Trelegy will go to [facility].

## 2023-08-28 NOTE — Patient Instructions (Signed)
 Asthma, Adult  Asthma is a long-term (chronic) condition that causes recurrent episodes in which the lower airways in the lungs become tight and narrow. The narrowing is caused by inflammation and tightening of the smooth muscle around the lower airways. Asthma episodes, also called asthma attacks or asthma flares, may cause coughing, making high-pitched whistling sounds when you breathe, most often when you breathe out (wheezing), shortness of breath, and chest pain. The airways may produce extra mucus caused by the inflammation and irritation. During an attack, it can be difficult to breathe. Asthma attacks can range from minor to life-threatening. Asthma cannot be cured, but medicines and lifestyle changes can help control it and treat acute attacks. It is important to keep your asthma well controlled so the condition does not interfere with your daily life. What are the causes? This condition is believed to be caused by inherited (genetic) and environmental factors, but its exact cause is not known. What can trigger an asthma attack? Many things can bring on an asthma attack or make symptoms worse. These triggers are different for every person. Common triggers include: Allergens and irritants like mold, dust, pet dander, cockroaches, pollen, air pollution, and chemical odors. Cigarette smoke. Weather changes and cold air. Stress and strong emotional responses such as crying or laughing hard. Certain medications such as aspirin or beta blockers. Infections and inflammatory conditions, such as the flu, a cold, pneumonia, or inflammation of the nasal membranes (rhinitis). Gastroesophageal reflux disease (GERD). What are the signs or symptoms? Symptoms may occur right after exposure to an asthma trigger or hours later and can vary by person. Common signs and symptoms include: Wheezing. Trouble breathing (shortness of breath). Excessive nighttime or early morning coughing. Chest  tightness. Tiredness (fatigue) with minimal activity. Difficulty talking in complete sentences. Poor exercise tolerance. How is this diagnosed? This condition is diagnosed based on: A physical exam and your medical history. Tests, which may include: Lung function studies to evaluate the flow of air in your lungs. Allergy tests. Imaging tests, such as X-rays. How is this treated? There is no cure, but symptoms can be controlled with proper treatment. Treatment usually involves: Identifying and avoiding your asthma triggers. Inhaled medicines. Two types are commonly used to treat asthma, depending on severity: Controller medicines. These help prevent asthma symptoms from occurring. They are taken every day. Fast-acting reliever or rescue medicines. These quickly relieve asthma symptoms. They are used as needed and provide short-term relief. Using other medicines, such as: Allergy medicines, such as antihistamines, if your asthma attacks are triggered by allergens. Immune medicines (immunomodulators). These are medicines that help control the immune system. Using supplemental oxygen. This is only needed during a severe episode. Creating an asthma action plan. An asthma action plan is a written plan for managing and treating your asthma attacks. This plan includes: A list of your asthma triggers and how to avoid them. Information about when medicines should be taken and when their dosage should be changed. Instructions about using a device called a peak flow meter. A peak flow meter measures how well the lungs are working and the severity of your asthma. It helps you monitor your condition. Follow these instructions at home: Take over-the-counter and prescription medicines only as told by your health care provider. Stay up to date on all vaccinations as recommended by your healthcare provider, including vaccines for the flu and pneumonia. Use a peak flow meter and keep track of your peak flow  readings. Understand and use your asthma  action plan to address any asthma flares. Do not smoke or allow anyone to smoke in your home. Contact a health care provider if: You have wheezing, shortness of breath, or a cough that is not responding to medicines. Your medicines are causing side effects, such as a rash, itching, swelling, or trouble breathing. You need to use a reliever medicine more than 2-3 times a week. Your peak flow reading is still at 50-79% of your personal best after following your action plan for 1 hour. You have a fever and shortness of breath. Get help right away if: You are getting worse and do not respond to treatment during an asthma attack. You are short of breath when at rest or when doing very little physical activity. You have difficulty eating, drinking, or talking. You have chest pain or tightness. You develop a fast heartbeat or palpitations. You have a bluish color to your lips or fingernails. You are light-headed or dizzy, or you faint. Your peak flow reading is less than 50% of your personal best. You feel too tired to breathe normally. These symptoms may be an emergency. Get help right away. Call 911. Do not wait to see if the symptoms will go away. Do not drive yourself to the hospital. Summary Asthma is a long-term (chronic) condition that causes recurrent episodes in which the airways become tight and narrow. Asthma episodes, also called asthma attacks or asthma flares, can cause coughing, wheezing, shortness of breath, and chest pain. Asthma cannot be cured, but medicines and lifestyle changes can help keep it well controlled and prevent asthma flares. Make sure you understand how to avoid triggers and how and when to use your medicines. Asthma attacks can range from minor to life-threatening. Get help right away if you have an asthma attack and do not respond to treatment with your usual rescue medicines. This information is not intended to replace  advice given to you by your health care provider. Make sure you discuss any questions you have with your health care provider. Document Revised: 12/19/2020 Document Reviewed: 12/10/2020 Elsevier Patient Education  2024 [employer].

## 2023-08-29 LAB — COMPREHENSIVE METABOLIC PANEL WITH GFR
AG Ratio: 1.4 (calc) (ref 1.0–2.5)
ALT: 23 U/L (ref 6–29)
AST: 21 U/L (ref 10–35)
Albumin: 4 g/dL (ref 3.6–5.1)
Alkaline phosphatase (APISO): 88 U/L (ref 37–153)
BUN: 14 mg/dL (ref 7–25)
CO2: 28 mmol/L (ref 20–32)
Calcium: 9.1 mg/dL (ref 8.6–10.4)
Chloride: 106 mmol/L (ref 98–110)
Creat: 0.8 mg/dL (ref 0.50–1.03)
Globulin: 2.9 g/dL (ref 1.9–3.7)
Glucose, Bld: 94 mg/dL (ref 65–99)
Potassium: 4.7 mmol/L (ref 3.5–5.3)
Sodium: 143 mmol/L (ref 135–146)
Total Bilirubin: 0.3 mg/dL (ref 0.2–1.2)
Total Protein: 6.9 g/dL (ref 6.1–8.1)
eGFR: 85 mL/min/{1.73_m2} (ref >=60)

## 2023-08-29 LAB — CBC WITH DIFFERENTIAL/PLATELET
Absolute Lymphocytes: 2283 {cells}/uL (ref 850–3900)
Absolute Monocytes: 598 {cells}/uL (ref 200–950)
Basophils Absolute: 98 {cells}/uL (ref 0–200)
Basophils Relative: 1 %
Eosinophils Absolute: 480 {cells}/uL (ref 15–500)
Eosinophils Relative: 4.9 %
HCT: 45 % (ref 35.0–45.0)
Hemoglobin: 14.4 g/dL (ref 11.7–15.5)
MCH: 28.6 pg (ref 27.0–33.0)
MCHC: 32 g/dL (ref 32.0–36.0)
MCV: 89.5 fL (ref 80.0–100.0)
MPV: 11.6 fL (ref 7.5–12.5)
Monocytes Relative: 6.1 %
Neutro Abs: 6341 {cells}/uL (ref 1500–7800)
Neutrophils Relative %: 64.7 %
Platelets: 280 10*3/uL (ref 140–400)
RBC: 5.03 10*6/uL (ref 3.80–5.10)
RDW: 14.2 % (ref 11.0–15.0)
Total Lymphocyte: 23.3 %
WBC: 9.8 10*3/uL (ref 3.8–10.8)

## 2023-08-29 LAB — IRON,TIBC AND FERRITIN PANEL
%SAT: 23 % (ref 16–45)
Ferritin: 78 ng/mL (ref 16–232)
Iron: 53 ug/dL (ref 45–160)
TIBC: 235 ug/dL — ABNORMAL LOW (ref 250–450)

## 2023-08-29 LAB — MAGNESIUM: Magnesium: 2.5 mg/dL (ref 1.5–2.5)

## 2023-09-05 ENCOUNTER — Ambulatory Visit: Payer: Self-pay | Admitting: Family Medicine

## 2023-09-08 ENCOUNTER — Ambulatory Visit: Admitting: Nurse Practitioner

## 2023-09-14 ENCOUNTER — Encounter: Payer: Self-pay | Admitting: Family Medicine

## 2023-09-15 ENCOUNTER — Other Ambulatory Visit: Payer: Self-pay | Admitting: Family Medicine

## 2023-09-15 DIAGNOSIS — N760 Acute vaginitis: Secondary | ICD-10-CM

## 2023-09-15 MED ORDER — FLUCONAZOLE 150 MG PO TABS: 150.0000 mg | ORAL_TABLET | Freq: Every day | ORAL | 0 refills | Status: DC

## 2023-09-21 ENCOUNTER — Telehealth: Payer: Self-pay | Admitting: Physician Assistant

## 2023-09-21 NOTE — Telephone Encounter (Signed)
 Rock,  I spoke with thsi patinent

## 2023-09-21 NOTE — Telephone Encounter (Signed)
 Inbound call from patient's insurance stating patient will receive contracted rate for procedures and will cover up to 70 percent of procedures.

## 2023-09-21 NOTE — Telephone Encounter (Signed)
 Rock,   I spoke with this patient today regarding her procedure coming up on 09-29-2023.    Her new insurance will not cover her procedure. I explained she could be self pay and once she receives her bill to make either arrangements with [place] or apply for [place] financial assistance.    She stated she was in pain and had canceled this procedure due to insurance before, She also stated that she had seen Alan Coombs and would like to see if she could give her something for pain or advise as to what she should do.  She would like some to call her regarding this matter.

## 2023-09-22 ENCOUNTER — Telehealth: Payer: Self-pay

## 2023-09-22 NOTE — Telephone Encounter (Signed)
 Deborah Watts

## 2023-09-22 NOTE — Telephone Encounter (Signed)
 Called and spoke with pt. She stated if the procedure had been cancelled she would have needed something for the discomfort she has been having. She will keep her appt as scheduled on 09/29/23 for the endo/colon.

## 2023-09-29 ENCOUNTER — Ambulatory Visit: Payer: PRIVATE HEALTH INSURANCE | Admitting: Internal Medicine

## 2023-09-29 ENCOUNTER — Encounter: Payer: Self-pay | Admitting: Internal Medicine

## 2023-09-29 VITALS — BP 122/86 | HR 79 | Temp 97.9°F | Resp 16 | Ht 59.0 in | Wt 233.0 lb

## 2023-09-29 DIAGNOSIS — R1319 Other dysphagia: Secondary | ICD-10-CM

## 2023-09-29 DIAGNOSIS — K3189 Other diseases of stomach and duodenum: Secondary | ICD-10-CM

## 2023-09-29 DIAGNOSIS — D128 Benign neoplasm of rectum: Secondary | ICD-10-CM

## 2023-09-29 DIAGNOSIS — E611 Iron deficiency: Secondary | ICD-10-CM

## 2023-09-29 DIAGNOSIS — K621 Rectal polyp: Secondary | ICD-10-CM | POA: Diagnosis present

## 2023-09-29 DIAGNOSIS — R1310 Dysphagia, unspecified: Secondary | ICD-10-CM

## 2023-09-29 DIAGNOSIS — K5730 Diverticulosis of large intestine without perforation or abscess without bleeding: Secondary | ICD-10-CM | POA: Diagnosis not present

## 2023-09-29 DIAGNOSIS — R195 Other fecal abnormalities: Secondary | ICD-10-CM | POA: Diagnosis not present

## 2023-09-29 DIAGNOSIS — K2289 Other specified disease of esophagus: Secondary | ICD-10-CM | POA: Diagnosis not present

## 2023-09-29 DIAGNOSIS — K2100 Gastro-esophageal reflux disease with esophagitis, without bleeding: Secondary | ICD-10-CM | POA: Diagnosis not present

## 2023-09-29 DIAGNOSIS — Z9889 Other specified postprocedural states: Secondary | ICD-10-CM | POA: Diagnosis not present

## 2023-09-29 DIAGNOSIS — K319 Disease of stomach and duodenum, unspecified: Secondary | ICD-10-CM | POA: Diagnosis not present

## 2023-09-29 MED ORDER — SODIUM CHLORIDE 0.9 % IV SOLN: 500.0000 mL | Freq: Once | INTRAVENOUS | Status: DC

## 2023-09-29 NOTE — Progress Notes (Signed)
 [place], gd SR, tolerated procedure well, VSS, report to RN

## 2023-09-29 NOTE — Patient Instructions (Signed)
 YOU HAD AN ENDOSCOPIC PROCEDURE TODAY AT THE [facility]:   Refer to the procedure report that was given to you for any specific questions about what was found during the examination.  If the procedure report does not answer your questions, please call your gastroenterologist to clarify.  If you requested that your care partner not be given the details of your procedure findings, then the procedure report has been included in a sealed envelope for you to review at your convenience later.  YOU SHOULD EXPECT: Some feelings of bloating in the abdomen. Passage of more gas than usual.  Walking can help get rid of the air that was put into your GI tract during the procedure and reduce the bloating. If you had a lower endoscopy (such as a colonoscopy or flexible sigmoidoscopy) you may notice spotting of blood in your stool or on the toilet paper. If you underwent a bowel prep for your procedure, you may not have a normal bowel movement for a few days.  Please Note:  You might notice some irritation and congestion in your nose or some drainage.  This is from the oxygen used during your procedure.  There is no need for concern and it should clear up in a day or so.  SYMPTOMS TO REPORT IMMEDIATELY:  Following lower endoscopy (colonoscopy or flexible sigmoidoscopy):  Excessive amounts of blood in the stool  Significant tenderness or worsening of abdominal pains  Swelling of the abdomen that is new, acute  Fever of 100F or higher  Following upper endoscopy (EGD)  Vomiting of blood or coffee ground material  New chest pain or pain under the shoulder blades  Painful or persistently difficult swallowing  New shortness of breath  Fever of 100F or higher  Black, tarry-looking stools   Colonoscopy Resume previous diet Continue present medications Await pathology results  Endoscopy Resume previous diet Continue present medications Await pathology results If persistent trouble swallowing  after dilation and bipsies negative for EOE then barium esophagram with upper GI recommended   For urgent or emergent issues, a gastroenterologist can be reached at any hour by calling [phone]. Do not use MyChart messaging for urgent concerns.    DIET:  We do recommend a small meal at first, but then you may proceed to your regular diet.  Drink plenty of fluids but you should avoid alcoholic beverages for 24 hours.  ACTIVITY:  You should plan to take it easy for the rest of today and you should NOT DRIVE or use heavy machinery until tomorrow (because of the sedation medicines used during the test).    FOLLOW UP: Our staff will call the number listed on your records the next business day following your procedure.  We will call around 7:15- 8:00 am to check on you and address any questions or concerns that you may have regarding the information given to you following your procedure. If we do not reach you, we will leave a message.     If any biopsies were taken you will be contacted by phone or by letter within the next 1-3 weeks.  Please call us  at [phone] if you have not heard about the biopsies in 3 weeks.    SIGNATURES/CONFIDENTIALITY: You and/or your care partner have signed paperwork which will be entered into your electronic medical record.  These signatures attest to the fact that that the information above on your After Visit Summary has been reviewed and is understood.  Full responsibility of the  confidentiality of this discharge information lies with you and/or your care-partner.

## 2023-09-29 NOTE — Progress Notes (Signed)
 Called to room to assist during endoscopic procedure.  Patient ID and intended procedure confirmed with present staff. Received instructions for my participation in the procedure from the performing physician.

## 2023-09-29 NOTE — Progress Notes (Signed)
 GASTROENTEROLOGY PROCEDURE H&P NOTE   Primary Care Physician: Antonio Cyndee Jamee JONELLE, DO    Reason for Procedure:  Iron deficiency, heme positive stool, dysphagia  Plan:    EGD and colonoscopy  Patient is appropriate for endoscopic procedure(s) in the ambulatory ([facility]) setting.  The nature of the procedure, as well as the risks, benefits, and alternatives were carefully and thoroughly reviewed with the patient. Ample time for discussion and questions allowed. The patient understood, was satisfied, and agreed to proceed.     HPI: Deborah Watts is a 58 y.o. female who presents for EGD and colonoscopy.  Medical history as below.  Tolerated the prep.  No recent chest pain or shortness of breath.  No abdominal pain today.  Past Medical History:  Diagnosis Date   Anxiety    Arthritis    Asthma    Headache    migraines   History of hiatal hernia    repaired 30 yrs ago in [place]   Hypertension    MHA (microangiopathic hemolytic anemia) (HCC)     Past Surgical History:  Procedure Laterality Date   APPENDECTOMY N/A    BACK SURGERY     LAPAROSCOPIC CHOLECYSTECTOMY N/A 03/1992   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   RE[facility]ACEMENT TOTAL KNEE Right [facility]/15/2021   [facility]   TOTAL ABDOMINAL HYSTERECTOMY Right    TUBAL LIGATION     WRIST ARTHROSCOPY Right 02/12/2018   Procedure: Right wrist arthroscopy, evaluation under anesthesia with debridement and repair as necessary and right carpal tunnel release;  Surgeon: Camella Fallow, MD;  Location: [facility] OR;  Service: Orthopedics;  Laterality: Right;  90 mins    Prior to Admission medications   Medication Sig Start Date End Date Taking? Authorizing Provider  ALPRAZolam  (XANAX ) 0.5 MG tablet TAKE 1 TABLET BY MOUTH THREE TIMES PER DAY AS NEEDED 08/28/23  Yes Lowne Chase, Yvonne R, DO  diclofenac  (VOLTAREN ) 75 MG EC tablet TAKE ONE TABLET BY MOUTH TWICE DAILY 08/11/23  Yes Lowne Chase, Yvonne R, DO  fluconazole  (DIFLUCAN ) 150 MG  tablet Take 1 tablet (150 mg total) by mouth daily. May repeat in 3 days if needed. 09/15/23  Yes Antonio Cyndee Jamee R, DO  FLUoxetine  (PROZAC ) 20 MG capsule Take 1 capsule (20 mg total) by mouth daily. 08/28/23  Yes Antonio Cyndee Jamee R, DO  fluticasone  (FLONASE ) 50 MCG/ACT nasal spray Place 2 sprays into both nostrils daily. 01/06/22  Yes Moishe Chiquita HERO, NP  furosemide  (LASIX ) 40 MG tablet Take 1 tablet (40 mg total) by mouth daily. 05/08/23  Yes Antonio Cyndee Jamee R, DO  Meclizine  HCl (TRAVEL SICKNESS) 25 MG CHEW CHEW 1 TABLET EVERY 6 HOURS AS NEEDED 01/07/23  Yes Antonio Cyndee Jamee R, DO  methocarbamol  (ROBAXIN -750) 750 MG tablet Take 1 tablet (750 mg total) by mouth every 6 (six) hours as needed for muscle spasms. 05/08/23  Yes Antonio Cyndee Jamee R, DO  montelukast  (SINGULAIR ) 10 MG tablet Take 1 tablet (10 mg total) by mouth at bedtime. 05/08/23  Yes Antonio Cyndee Jamee R, DO  pantoprazole  (PROTONIX ) 20 MG tablet Take 1 tablet (20 mg total) by mouth daily. 05/08/23  Yes Antonio Cyndee Jamee R, DO  topiramate  (TOPAMAX ) 50 MG tablet Take 1 tablet (50 mg total) by mouth 2 (two) times daily. 05/08/23  Yes Antonio Cyndee Jamee R, DO  albuterol  (PROVENTIL ) (2.5 MG/3ML) 0.083% nebulizer solution Take 3 mLs (2.5 mg total) by nebulization every 6 (six) hours as needed for  wheezing or shortness of breath. 12/30/22   Antonio Cyndee Jamee JONELLE, DO  albuterol  (VENTOLIN  HFA) 108 (90 Base) MCG/ACT inhaler Inhale 2 puffs into the lungs every 6 (six) hours as needed for wheezing or shortness of breath (Cough). 05/08/23   Antonio Cyndee Jamee JONELLE, DO  azelastine  (ASTELIN ) 0.1 % nasal spray Place 1 spray into both nostrils 2 (two) times daily. Use in each nostril as directed 05/08/23   Antonio Cyndee, Jamee JONELLE, DO  benzonatate  (TESSALON  PERLES) 100 MG capsule Take 2 capsules (200 mg total) by mouth 3 (three) times daily as needed for cough. 06/22/23   Lowne Chase, Yvonne R, DO  clotrimazole -betamethasone  (LOTRISONE ) cream Apply 1  Application topically daily. 08/28/23   Antonio Cyndee Jamee JONELLE, DO  doxycycline  (VIBRA -TABS) 100 MG tablet Take 1 tablet (100 mg total) by mouth 2 (two) times daily. 06/22/23   Antonio Cyndee Jamee JONELLE, DO  Fluticasone -Umeclidin-Vilant (TRELEGY ELLIPTA ) 100-62.5-25 MCG/ACT AEPB Inhale 1 puff into the lungs daily. Patient not taking: Reported on 09/29/2023 08/28/23   Antonio Cyndee, Jamee JONELLE, DO  HYDROcodone -acetaminophen  (NORCO/VICODIN) 5-325 MG tablet Take 1 tablet by mouth every 6 (six) hours as needed for moderate pain (pain score 4-6). 03/27/23   Lowne Chase, Yvonne R, DO  ipratropium (ATROVENT ) 0.03 % nasal spray Place 2 sprays into both nostrils every 12 (twelve) hours. 10/11/21   Vivienne Delon HERO, PA-C  Ketorolac  Tromethamine  (TORADOL  ORAL PO) Take 10 mg by mouth every 6 (six) hours as needed. Patient not taking: Reported on 09/29/2023 03/16/22   [provider]  NONFORMULARY OR COMPOUNDED ITEM Compression socks  20-30 mm hg #1 as directed Patient not taking: Reported on 09/29/2023 08/18/22   Antonio Cyndee Jamee JONELLE, DO  nystatin  (MYCOSTATIN ) 100000 UNIT/ML suspension Take 5 mLs (500,000 Units total) by mouth 4 (four) times daily. 01/27/19   Antonio Cyndee Jamee R, DO  pregabalin  (LYRICA ) 225 MG capsule TAKE ONE CAPSULE BY MOUTH TWICE DAILY Patient not taking: Reported on 07/09/2023 06/14/23   Webb, Padonda B, FNP  pregabalin  (LYRICA ) 300 MG capsule Take 1 capsule (300 mg total) by mouth 2 (two) times daily. 05/08/23   Lowne Chase, Yvonne R, DO  promethazine -dextromethorphan (PROMETHAZINE -DM) 6.25-15 MG/5ML syrup Take 5 mLs by mouth 4 (four) times daily as needed. 06/15/23   Lowne Chase, Yvonne R, DO  sulfamethoxazole -trimethoprim  (BACTRIM  DS) 800-160 MG tablet Take 1 tablet by mouth 2 (two) times daily. 08/28/23   Antonio Cyndee Jamee JONELLE, DO  SUMAtriptan  (IMITREX ) 100 MG tablet TAKE 1 TABLET BY MOUTH AS NEEDED FOR MIGRAINE. REPEAT IN 2 HOURS IF NEEDED 05/08/23   Antonio Cyndee, Yvonne R, DO  traMADol  (ULTRAM )  50 MG tablet Take 1 tablet (50 mg total) by mouth every 8 (eight) hours as needed. 06/16/23   Lowne Chase, Yvonne R, DO  triamcinolone  cream (KENALOG ) 0.1 % Apply 1 Application topically 2 (two) times daily. Patient not taking: Reported on 09/29/2023 01/27/22   Almarie Waddell NOVAK, NP    Current Outpatient Medications  Medication Sig Dispense Refill   ALPRAZolam  (XANAX ) 0.5 MG tablet TAKE 1 TABLET BY MOUTH THREE TIMES PER DAY AS NEEDED 90 tablet 1   diclofenac  (VOLTAREN ) 75 MG EC tablet TAKE ONE TABLET BY MOUTH TWICE DAILY 60 tablet 1   fluconazole  (DIFLUCAN ) 150 MG tablet Take 1 tablet (150 mg total) by mouth daily. May repeat in 3 days if needed. 2 tablet 0   FLUoxetine  (PROZAC ) 20 MG capsule Take 1 capsule (20 mg total) by mouth daily. 90  capsule 3   fluticasone  (FLONASE ) 50 MCG/ACT nasal spray Place 2 sprays into both nostrils daily. 16 g 0   furosemide  (LASIX ) 40 MG tablet Take 1 tablet (40 mg total) by mouth daily. 30 tablet 3   Meclizine  HCl (TRAVEL SICKNESS) 25 MG CHEW CHEW 1 TABLET EVERY 6 HOURS AS NEEDED 30 tablet 1   methocarbamol  (ROBAXIN -750) 750 MG tablet Take 1 tablet (750 mg total) by mouth every 6 (six) hours as needed for muscle spasms.     montelukast  (SINGULAIR ) 10 MG tablet Take 1 tablet (10 mg total) by mouth at bedtime. 90 tablet 3   pantoprazole  (PROTONIX ) 20 MG tablet Take 1 tablet (20 mg total) by mouth daily. 30 tablet 2   topiramate  (TOPAMAX ) 50 MG tablet Take 1 tablet (50 mg total) by mouth 2 (two) times daily. 180 tablet 1   albuterol  (PROVENTIL ) (2.5 MG/3ML) 0.083% nebulizer solution Take 3 mLs (2.5 mg total) by nebulization every 6 (six) hours as needed for wheezing or shortness of breath. 150 mL 1   albuterol  (VENTOLIN  HFA) 108 (90 Base) MCG/ACT inhaler Inhale 2 puffs into the lungs every 6 (six) hours as needed for wheezing or shortness of breath (Cough). 18 g 3   azelastine  (ASTELIN ) 0.1 % nasal spray Place 1 spray into both nostrils 2 (two) times daily. Use in each  nostril as directed 30 mL 12   benzonatate  (TESSALON  PERLES) 100 MG capsule Take 2 capsules (200 mg total) by mouth 3 (three) times daily as needed for cough. 20 capsule 0   clotrimazole -betamethasone  (LOTRISONE ) cream Apply 1 Application topically daily. 30 g 0   doxycycline  (VIBRA -TABS) 100 MG tablet Take 1 tablet (100 mg total) by mouth 2 (two) times daily. 20 tablet 0   Fluticasone -Umeclidin-Vilant (TRELEGY ELLIPTA ) 100-62.5-25 MCG/ACT AEPB Inhale 1 puff into the lungs daily. (Patient not taking: Reported on 09/29/2023) 1 each 11   HYDROcodone -acetaminophen  (NORCO/VICODIN) 5-325 MG tablet Take 1 tablet by mouth every 6 (six) hours as needed for moderate pain (pain score 4-6). 20 tablet 0   ipratropium (ATROVENT ) 0.03 % nasal spray Place 2 sprays into both nostrils every 12 (twelve) hours. 30 mL 0   Ketorolac  Tromethamine  (TORADOL  ORAL PO) Take 10 mg by mouth every 6 (six) hours as needed. (Patient not taking: Reported on 09/29/2023)     NONFORMULARY OR COMPOUNDED ITEM Compression socks  20-30 mm hg #1 as directed (Patient not taking: Reported on 09/29/2023) 1 each 1   nystatin  (MYCOSTATIN ) 100000 UNIT/ML suspension Take 5 mLs (500,000 Units total) by mouth 4 (four) times daily. 60 mL 0   pregabalin  (LYRICA ) 225 MG capsule TAKE ONE CAPSULE BY MOUTH TWICE DAILY (Patient not taking: Reported on 07/09/2023) 60 capsule 0   pregabalin  (LYRICA ) 300 MG capsule Take 1 capsule (300 mg total) by mouth 2 (two) times daily. 180 capsule 1   promethazine -dextromethorphan (PROMETHAZINE -DM) 6.25-15 MG/5ML syrup Take 5 mLs by mouth 4 (four) times daily as needed. 118 mL 0   sulfamethoxazole -trimethoprim  (BACTRIM  DS) 800-160 MG tablet Take 1 tablet by mouth 2 (two) times daily. 20 tablet 0   SUMAtriptan  (IMITREX ) 100 MG tablet TAKE 1 TABLET BY MOUTH AS NEEDED FOR MIGRAINE. REPEAT IN 2 HOURS IF NEEDED 12 tablet 3   traMADol  (ULTRAM ) 50 MG tablet Take 1 tablet (50 mg total) by mouth every 8 (eight) hours as needed. 30  tablet 0   triamcinolone  cream (KENALOG ) 0.1 % Apply 1 Application topically 2 (two) times daily. (Patient not taking: Reported on  09/29/2023) 30 g 0   Current Facility-Administered Medications  Medication Dose Route Frequency Provider Last Rate Last Admin   0.9 %  sodium chloride  infusion  500 mL Intravenous Once Bronte Sabado, Gordy HERO, MD        Allergies as of 09/29/2023 - Review Complete 09/29/2023  Allergen Reaction Noted   Fish allergy Hives and Swelling 09/29/2014   Iohexol  Other (See Comments) 01/16/2005   Ivp dye [iodinated contrast media] Hives and Swelling 03/04/2011   Penicillins Anaphylaxis, Hives, and Other (See Comments)    Tizanidine hcl Nausea And Vomiting, Rash, and Shortness Of Breath 09/05/2021   Morphine  and codeine  Nausea And Vomiting 01/28/2018    Family History  Problem Relation Age of Onset   Throat cancer Father    Kidney failure Father    Asthma Other    Diabetes Other    Hyperlipidemia Other     Social History   Socioeconomic History   Marital status: Married    Spouse name: Not on file   Number of children: Not on file   Years of education: Not on file   Highest education level: Some college, no degree  Occupational History   Occupation: Administrator, 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[id number] [place]  Tobacco Use   Smoking status: Never   Smokeless tobacco: Never  Vaping Use   Vaping status: Never Used  Substance and Sexual Activity   Alcohol use: Not Currently    Comment: rare   Drug use: No   Sexual activity: Not on file  Other Topics Concern   Not on file  Social History Narrative   Exercise-- no   Social Drivers of Health   Financial Resource Strain: Low Risk  (08/27/2023)   Overall Financial Resource Strain (CARDIA)    Difficulty of Paying Living Expenses: Not very hard  Food Insecurity: No Food Insecurity (08/27/2023)   Hunger Vital Sign    Worried About Running Out of Food in the Last Year: Never true    Ran Out of Food in the Last Year: Never true   Transportation Needs: No Transportation Needs (08/27/2023)   PRAPARE - Administrator, Civil Service (Medical): No    Lack of Transportation (Non-Medical): No  Physical Activity: Insufficiently Active (08/27/2023)   Exercise Vital Sign    Days of Exercise per Week: 1 day    Minutes of Exercise per Session: 10 min  Stress: Stress Concern Present (08/27/2023)   [employer] of Occupational Health - Occupational Stress Questionnaire    Feeling of Stress: To some extent  Social Connections: Socially Integrated (08/27/2023)   Social Connection and Isolation Panel    Frequency of Communication with Friends and Family: More than three times a week    Frequency of Social Gatherings with Friends and Family: Twice a week    Attends Religious Services: More than 4 times per year    Active Member of [employer] or Organizations: Yes    Attends Banker Meetings: Patient declined    Marital Status: Married  Catering manager Violence: Not At Risk (03/19/2022)   Received from [facility]   HITS    Over the last 12 months how often did your partner physically hurt you?: Never    Over the last 12 months how often did your partner insult you or talk down to you?: Never    Over the last 12 months how often did your partner threaten you with physical harm?: Never    Over the last 12  months how often did your partner scream or curse at you?: Never    Physical Exam: Vital signs in last 24 hours: @BP  (!) 148/83   Pulse 82   Temp 97.9 F (36.6 C) (Temporal)   Resp 11   Ht 4' 11 (1.499 m)   Wt 233 lb (105.7 kg)   SpO2 94%   BMI 47.06 kg/m  GEN: NAD EYE: Sclerae anicteric ENT: MMM CV: Non-tachycardic Pulm: CTA b/l GI: Soft, NT/ND NEURO:  Alert & Oriented x 3   Gordy Starch, MD [facility]  09/29/2023 3:39 PM

## 2023-09-29 NOTE — Op Note (Signed)
 [facility] Patient Name: Deborah Watts Procedure Date: 09/29/2023 3:21 PM MRN: [id number] Endoscopist: Gordy CHRISTELLA Starch , MD, [id number] Age: 58 Referring MD:  Date of Birth: 20-Dec-1965 Gender: Female Account #: [id number] Procedure:                Upper GI endoscopy Indications:              Dysphagia, Heme positive stool, prior hiatal hernia                            repair, borderline iron def without anemia Medicines:                Monitored Anesthesia Care Procedure:                Pre-Anesthesia Assessment:                           - Prior to the procedure, a History and Physical                            was performed, and patient medications and                            allergies were reviewed. The patient's tolerance of                            previous anesthesia was also reviewed. The risks                            and benefits of the procedure and the sedation                            options and risks were discussed with the patient.                            All questions were answered, and informed consent                            was obtained. Prior Anticoagulants: The patient has                            taken no anticoagulant or antiplatelet agents. ASA                            Grade Assessment: III - A patient with severe                            systemic disease. After reviewing the risks and                            benefits, the patient was deemed in satisfactory                            condition to undergo the procedure.  After obtaining informed consent, the endoscope was                            passed under direct vision. Throughout the                            procedure, the patient's blood pressure, pulse, and                            oxygen saturations were monitored continuously. The                            GIF [id number] #[id number] was introduced through the                            mouth, and  advanced to the second part of duodenum.                            The upper GI endoscopy was accomplished without                            difficulty. The patient tolerated the procedure                            well. Scope In: Scope Out: Findings:                 No endoscopic abnormality was evident in the                            esophagus to explain the patient's complaint of                            dysphagia. It was decided, however, to proceed with                            dilation of the lower third of the esophagus and at                            the gastroesophageal junction. A TTS dilator was                            passed through the scope. Dilation with a                            15-16.5-18 mm balloon dilator was performed to 18                            mm. The dilation site was examined and showed no                            change. Multiple biopsies were obtained with cold  forceps for evaluation of eosinophilic esophagitis                            randomly in the upper third of the esophagus and in                            the lower third of the esophagus.                           Evidence of a prior Nissen fundoplication was found                            in the gastric fundus.                           The entire examined stomach was normal. Biopsies                            were taken with a cold forceps for Helicobacter                            pylori testing.                           The examined duodenum was normal. Biopsies for                            histology were taken with a cold forceps for                            evaluation of celiac disease. Complications:            No immediate complications. Estimated Blood Loss:     Estimated blood loss was minimal. Impression:               - No endoscopic esophageal abnormality to explain                            patient's dysphagia. Esophagus dilated  to 18 mm                            with balloon.                           - A Nissen fundoplication was found in                            cardia/fundus.                           - Normal stomach. Biopsied.                           - Normal examined duodenum. Biopsied.                           - Multiple  biopsies were obtained in the upper                            third of the esophagus and in the lower third of                            the esophagus. Recommendation:           - Patient has a contact number available for                            emergencies. The signs and symptoms of potential                            delayed complications were discussed with the                            patient. Return to normal activities tomorrow.                            Written discharge instructions were provided to the                            patient.                           - Resume previous diet.                           - Continue present medications.                           - Await pathology results.                           - If persistent trouble swallowing after dilation                            and biopsies negative for EOE then barium                            esophagram with upper GI recommended.                           - See the other procedure note for documentation of                            additional recommendations. Gordy CHRISTELLA Starch, MD 09/29/2023 4:14:49 PM This report has been signed electronically.

## 2023-09-29 NOTE — Op Note (Signed)
 [facility] Patient Name: Deborah Watts Procedure Date: 09/29/2023 3:14 PM MRN: [id number] Endoscopist: Gordy CHRISTELLA Starch , MD, [id number] Age: 58 Referring MD:  Date of Birth: 01-26-1966 Gender: Female Account #: [id number] Procedure:                Colonoscopy Indications:              This is the patient's first colonoscopy (Negative                            Cologuard in 2023), Heme positive stool Medicines:                Monitored Anesthesia Care Procedure:                Pre-Anesthesia Assessment:                           - Prior to the procedure, a History and Physical                            was performed, and patient medications and                            allergies were reviewed. The patient's tolerance of                            previous anesthesia was also reviewed. The risks                            and benefits of the procedure and the sedation                            options and risks were discussed with the patient.                            All questions were answered, and informed consent                            was obtained. Prior Anticoagulants: The patient has                            taken no anticoagulant or antiplatelet agents. ASA                            Grade Assessment: III - A patient with severe                            systemic disease. After reviewing the risks and                            benefits, the patient was deemed in satisfactory                            condition to undergo the procedure.  After obtaining informed consent, the colonoscope                            was passed under direct vision. Throughout the                            procedure, the patient's blood pressure, pulse, and                            oxygen saturations were monitored continuously. The                            Olympus Scope SN: [id number] was introduced through                            the anus and  advanced to the cecum, identified by                            appendiceal orifice and ileocecal valve. The                            colonoscopy was performed without difficulty. The                            patient tolerated the procedure well. The quality                            of the bowel preparation was good. The ileocecal                            valve, appendiceal orifice, and rectum were                            photographed. Scope In: 3:51:19 PM Scope Out: 4:09:32 PM Scope Withdrawal Time: 0 hours 12 minutes 25 seconds  Total Procedure Duration: 0 hours 18 minutes 13 seconds  Findings:                 The digital rectal exam was normal.                           Two sessile polyps were found in the rectum. The                            polyps were 3 to 4 mm in size. These polyps were                            removed with a cold biopsy forceps. Resection and                            retrieval were complete.                           A few medium-mouthed and small-mouthed diverticula  were found in the sigmoid colon and descending                            colon.                           The exam was otherwise without abnormality on                            direct and retroflexion views. Complications:            No immediate complications. Estimated Blood Loss:     Estimated blood loss: none. Impression:               - Two 3 to 4 mm polyps in the rectum, removed with                            a cold biopsy forceps. Resected and retrieved.                           - Mild diverticulosis in the sigmoid colon and in                            the descending colon.                           - The examination was otherwise normal on direct                            and retroflexion views. Recommendation:           - Patient has a contact number available for                            emergencies. The signs and symptoms of potential                             delayed complications were discussed with the                            patient. Return to normal activities tomorrow.                            Written discharge instructions were provided to the                            patient.                           - Resume previous diet.                           - Continue present medications.                           - Await pathology results.                           -  Repeat colonoscopy is recommended. The                            colonoscopy date will be determined after pathology                            results from today's exam become available for                            review. Gordy CHRISTELLA Starch, MD 09/29/2023 4:17:22 PM This report has been signed electronically.

## 2023-09-30 ENCOUNTER — Telehealth: Payer: Self-pay

## 2023-09-30 NOTE — Telephone Encounter (Signed)
  Follow up Call-     09/29/2023    2:22 PM  Call back number  Post procedure Call Back phone  # [phone]  Permission to leave phone message Yes     Patient questions:  Do you have a fever, pain , or abdominal swelling? No. Pain Score  0 *  Have you tolerated food without any problems? yes  Have you been able to return to your normal activities? Yes.    Do you have any questions about your discharge instructions: Diet   No. Medications  No. Follow up visit  No.  Do you have questions or concerns about your Care? No.  Actions: * If pain score is 4 or above: No action needed, pain <4.

## 2023-10-02 ENCOUNTER — Other Ambulatory Visit: Payer: Self-pay | Admitting: Physician Assistant

## 2023-10-02 DIAGNOSIS — K219 Gastro-esophageal reflux disease without esophagitis: Secondary | ICD-10-CM

## 2023-10-02 LAB — SURGICAL PATHOLOGY

## 2023-10-05 ENCOUNTER — Ambulatory Visit: Payer: Self-pay | Admitting: Internal Medicine

## 2023-10-05 ENCOUNTER — Encounter: Payer: Self-pay | Admitting: Family Medicine

## 2023-10-05 ENCOUNTER — Other Ambulatory Visit: Payer: Self-pay

## 2023-10-05 DIAGNOSIS — K219 Gastro-esophageal reflux disease without esophagitis: Secondary | ICD-10-CM

## 2023-10-05 MED ORDER — PANTOPRAZOLE SODIUM 20 MG PO TBEC
20.0000 mg | DELAYED_RELEASE_TABLET | Freq: Every day | ORAL | 0 refills | Status: DC
Start: 2023-10-05 — End: 2023-10-14

## 2023-10-05 NOTE — Progress Notes (Signed)
 Medication refill

## 2023-10-05 NOTE — Telephone Encounter (Signed)
 Pt should schedule an appointment but could the medication being causing these sxs?

## 2023-10-06 ENCOUNTER — Encounter (HOSPITAL_BASED_OUTPATIENT_CLINIC_OR_DEPARTMENT_OTHER): Payer: Self-pay

## 2023-10-06 ENCOUNTER — Ambulatory Visit (HOSPITAL_BASED_OUTPATIENT_CLINIC_OR_DEPARTMENT_OTHER): Payer: Self-pay | Admitting: Family Medicine

## 2023-10-06 ENCOUNTER — Ambulatory Visit (HOSPITAL_BASED_OUTPATIENT_CLINIC_OR_DEPARTMENT_OTHER)
Admission: RE | Admit: 2023-10-06 | Discharge: 2023-10-06 | Disposition: A | Discharge status: Home / Self Care | Payer: PRIVATE HEALTH INSURANCE | Source: Ambulatory Visit | Attending: Family Medicine | Admitting: Family Medicine

## 2023-10-06 ENCOUNTER — Ambulatory Visit (HOSPITAL_BASED_OUTPATIENT_CLINIC_OR_DEPARTMENT_OTHER)
Admit: 2023-10-06 | Discharge: 2023-10-06 | Disposition: A | Discharge status: Home / Self Care | Payer: PRIVATE HEALTH INSURANCE | Attending: Family Medicine | Admitting: Radiology

## 2023-10-06 ENCOUNTER — Other Ambulatory Visit (HOSPITAL_BASED_OUTPATIENT_CLINIC_OR_DEPARTMENT_OTHER): Payer: Self-pay

## 2023-10-06 VITALS — BP 126/85 | HR 75 | Temp 98.1°F | Resp 20

## 2023-10-06 DIAGNOSIS — R1011 Right upper quadrant pain: Secondary | ICD-10-CM

## 2023-10-06 DIAGNOSIS — R102 Pelvic and perineal pain: Secondary | ICD-10-CM | POA: Diagnosis not present

## 2023-10-06 DIAGNOSIS — N760 Acute vaginitis: Secondary | ICD-10-CM

## 2023-10-06 DIAGNOSIS — N390 Urinary tract infection, site not specified: Secondary | ICD-10-CM | POA: Insufficient documentation

## 2023-10-06 DIAGNOSIS — R350 Frequency of micturition: Secondary | ICD-10-CM | POA: Diagnosis not present

## 2023-10-06 DIAGNOSIS — R319 Hematuria, unspecified: Secondary | ICD-10-CM | POA: Diagnosis not present

## 2023-10-06 DIAGNOSIS — R1031 Right lower quadrant pain: Secondary | ICD-10-CM

## 2023-10-06 DIAGNOSIS — R300 Dysuria: Secondary | ICD-10-CM

## 2023-10-06 LAB — POCT URINALYSIS DIP (MANUAL ENTRY)
Bilirubin, UA: NEGATIVE
Glucose, UA: NEGATIVE mg/dL
Ketones, POC UA: NEGATIVE mg/dL
Nitrite, UA: NEGATIVE
Protein Ur, POC: NEGATIVE mg/dL
Spec Grav, UA: 1.01 (ref 1.010–1.025)
Urobilinogen, UA: 0.2 U/dL
pH, UA: 6 (ref 5.0–8.0)

## 2023-10-06 MED ORDER — NITROFURANTOIN MONOHYD MACRO 100 MG PO CAPS
100.0000 mg | ORAL_CAPSULE | Freq: Two times a day (BID) | ORAL | 0 refills | Status: DC
  Filled 2023-10-06: qty 10, 5d supply, fill #0

## 2023-10-06 MED ORDER — FLUCONAZOLE 150 MG PO TABS
ORAL_TABLET | ORAL | 0 refills | Status: AC
  Filled 2023-10-06: qty 2, 5d supply, fill #0

## 2023-10-06 NOTE — ED Provider Notes (Signed)
 [facility]    CSN: [id number] Arrival date & time: 10/06/23  9176      History   Chief Complaint Chief Complaint  Patient presents with   Dysuria    HPI Deborah Watts is a 58 y.o. female.   58 year old female who is reporting burning with urination and urinary frequency since 10/03/2023 or earlier.  She is also seen blood in her underwear on the night of 10/04/2023 and 10/05/2023.  She has had a hysterectomy and is also menopausal so she does not feel that this is vaginal it has to be urinary blood.  She has not taken anything for her urinary symptoms.  She is not aware of any kidney stone.  She is having some right upper quadrant and right lower quadrant abdominal pain and suprapubic abdominal pain.   Dysuria Associated symptoms: abdominal pain   Associated symptoms: no fever, no nausea and no vomiting     Past Medical History:  Diagnosis Date   Anxiety    Arthritis    Asthma    Headache    migraines   History of hiatal hernia    repaired 30 yrs ago in [place]   Hypertension    MHA (microangiopathic hemolytic anemia) (HCC)     Patient Active Problem List   Diagnosis Date Noted   ANA positive 04/23/2023   Right upper quadrant abdominal pain 03/12/2023   Complex small left ovarian cyst on ultrasound 03/12/2023   Left lower quadrant abdominal pain 03/02/2023   Urinary frequency 03/02/2023   Iron deficiency anemia 03/02/2023   Pulmonary nodules 02/20/2023   Suspected sleep apnea 02/20/2023   Allergic rhinitis 02/20/2023   Pain of left calf 08/12/2022   Cellulitis of left lower extremity 08/12/2022   Edema, lower extremity 07/21/2022   SOB (shortness of breath) 07/21/2022   Hyperlipidemia 07/21/2022   Major depressive disorder, recurrent episode, moderate (HCC) 11/25/2021   Generalized anxiety disorder 11/25/2021   Depression with anxiety 10/28/2021   Asthma, chronic, unspecified asthma severity, uncomplicated 05/02/2021   Loud snoring 11/28/2019    Daytime sleepiness 11/28/2019   Other fatigue 11/28/2019   Primary osteoarthritis of left knee 08/02/2019   Rib pain 10/19/2018   Right knee pain 04/12/2017   Preventative health care 03/25/2016   Left ankle pain 01/14/2016   Left leg pain 04/28/2014   Pain of right calf 04/20/2014   Obesity (BMI 30-39.9) 01/06/2013   Thrush, oral 03/04/2011   TINEA CRURIS 02/05/2010   HYPOKALEMIA 04/26/2009   Primary hypertension 02/08/2008   LOW BACK PAIN SYNDROME 05/26/2007   LEG CRAMPS 05/14/2007   Edema 05/14/2007   Chronic migraine without aura, with status migrainosus 05/01/2007   ASTHMATIC BRONCHITIS, ACUTE 10/15/2006   Anxiety state 04/20/2006   RESTLESS LEG SYNDROME, HX OF 04/20/2006    Past Surgical History:  Procedure Laterality Date   APPENDECTOMY N/A    BACK SURGERY     LAPAROSCOPIC CHOLECYSTECTOMY N/A 03/1992   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   [facility]   RE[facility]ACEMENT TOTAL KNEE Right 01/15/2021   [facility]   TOTAL ABDOMINAL HYSTERECTOMY Right    TUBAL LIGATION     WRIST ARTHROSCOPY Right 02/12/2018   Procedure: Right wrist arthroscopy, evaluation under anesthesia with debridement and repair as necessary and right carpal tunnel release;  Surgeon: Camella Fallow, MD;  Location: [facility] OR;  Service: Orthopedics;  Laterality: Right;  90 mins    OB History     Gravida  3   Para  2  Term  2   Preterm      AB  1   Living  2      SAB      IAB  1   Ectopic      Multiple      Live Births  2            Home Medications    Prior to Admission medications   Medication Sig Start Date End Date Taking? Authorizing Provider  nitrofurantoin , macrocrystal-monohydrate, (MACROBID ) 100 MG capsule Take 1 capsule (100 mg total) by mouth 2 (two) times daily. 10/06/23  Yes Ival Domino, FNP  albuterol  (PROVENTIL ) (2.5 MG/3ML) 0.083% nebulizer solution Take 3 mLs (2.5 mg total) by nebulization every 6 (six) hours as needed for wheezing or shortness of breath. 12/30/22    Antonio Cyndee Jamee JONELLE, DO  albuterol  (VENTOLIN  HFA) 108 (90 Base) MCG/ACT inhaler Inhale 2 puffs into the lungs every 6 (six) hours as needed for wheezing or shortness of breath (Cough). 05/08/23   Antonio Cyndee, Jamee R, DO  ALPRAZolam  (XANAX ) 0.5 MG tablet TAKE 1 TABLET BY MOUTH THREE TIMES PER DAY AS NEEDED 08/28/23   Lowne Chase, Yvonne R, DO  azelastine  (ASTELIN ) 0.1 % nasal spray Place 1 spray into both nostrils 2 (two) times daily. Use in each nostril as directed 05/08/23   Antonio Cyndee, Jamee JONELLE, DO  benzonatate  (TESSALON  PERLES) 100 MG capsule Take 2 capsules (200 mg total) by mouth 3 (three) times daily as needed for cough. 06/22/23   Lowne Chase, Yvonne R, DO  clotrimazole -betamethasone  (LOTRISONE ) cream Apply 1 Application topically daily. 08/28/23   Lowne Chase, Yvonne R, DO  diclofenac  (VOLTAREN ) 75 MG EC tablet TAKE ONE TABLET BY MOUTH TWICE DAILY 08/11/23   Antonio Cyndee, Yvonne R, DO  doxycycline  (VIBRA -TABS) 100 MG tablet Take 1 tablet (100 mg total) by mouth 2 (two) times daily. 06/22/23   Antonio Cyndee Jamee JONELLE, DO  fluconazole  (DIFLUCAN ) 150 MG tablet Take 1 tablet (150 mg) now.  Repeat in 5 to 7 days. 10/06/23   Ival Domino, FNP  FLUoxetine  (PROZAC ) 20 MG capsule Take 1 capsule (20 mg total) by mouth daily. 08/28/23   Antonio Cyndee Jamee JONELLE, DO  fluticasone  (FLONASE ) 50 MCG/ACT nasal spray Place 2 sprays into both nostrils daily. 01/06/22   Moishe Chiquita HERO, NP  furosemide  (LASIX ) 40 MG tablet Take 1 tablet (40 mg total) by mouth daily. 05/08/23   Lowne Chase, Yvonne R, DO  HYDROcodone -acetaminophen  (NORCO/VICODIN) 5-325 MG tablet Take 1 tablet by mouth every 6 (six) hours as needed for moderate pain (pain score 4-6). 03/27/23   Lowne Chase, Yvonne R, DO  ipratropium (ATROVENT ) 0.03 % nasal spray Place 2 sprays into both nostrils every 12 (twelve) hours. 10/11/21   Vivienne Delon HERO, PA-C  Meclizine  HCl (TRAVEL SICKNESS) 25 MG CHEW CHEW 1 TABLET EVERY 6 HOURS AS NEEDED 01/07/23   Antonio Cyndee,  Yvonne R, DO  methocarbamol  (ROBAXIN -750) 750 MG tablet Take 1 tablet (750 mg total) by mouth every 6 (six) hours as needed for muscle spasms. 05/08/23   Antonio Cyndee Jamee R, DO  montelukast  (SINGULAIR ) 10 MG tablet Take 1 tablet (10 mg total) by mouth at bedtime. 05/08/23   Lowne Chase, Yvonne R, DO  nystatin  (MYCOSTATIN ) 100000 UNIT/ML suspension Take 5 mLs (500,000 Units total) by mouth 4 (four) times daily. 01/27/19   Antonio Cyndee Jamee JONELLE, DO  pantoprazole  (PROTONIX ) 20 MG tablet Take 1 tablet (20 mg total) by mouth daily.  10/05/23   Craig Alan SAUNDERS, PA-C  pregabalin  (LYRICA ) 300 MG capsule Take 1 capsule (300 mg total) by mouth 2 (two) times daily. 05/08/23   Antonio Cyndee Rockers R, DO  SUMAtriptan  (IMITREX ) 100 MG tablet TAKE 1 TABLET BY MOUTH AS NEEDED FOR MIGRAINE. REPEAT IN 2 HOURS IF NEEDED 05/08/23   Antonio Cyndee, Yvonne R, DO  topiramate  (TOPAMAX ) 50 MG tablet Take 1 tablet (50 mg total) by mouth 2 (two) times daily. 05/08/23   Antonio Cyndee Rockers SAUNDERS, DO  traMADol  (ULTRAM ) 50 MG tablet Take 1 tablet (50 mg total) by mouth every 8 (eight) hours as needed. 06/16/23   Antonio Cyndee Rockers SAUNDERS, DO    Family History Family History  Problem Relation Age of Onset   Throat cancer Father    Kidney failure Father    Asthma Other    Diabetes Other    Hyperlipidemia Other     Social History Social History   Tobacco Use   Smoking status: Never   Smokeless tobacco: Never  Vaping Use   Vaping status: Never Used  Substance Use Topics   Alcohol use: Not Currently    Comment: rare   Drug use: No     Allergies   Fish allergy, Iohexol , Ivp dye [iodinated contrast media], Penicillins, Tizanidine hcl, and Morphine  and codeine    Review of Systems Review of Systems  Constitutional:  Negative for chills and fever.  HENT:  Negative for ear pain and sore throat.   Eyes:  Negative for pain and visual disturbance.  Respiratory:  Negative for cough and shortness of breath.   Cardiovascular:   Negative for chest pain and palpitations.  Gastrointestinal:  Positive for abdominal pain. Negative for constipation, diarrhea, nausea and vomiting.  Genitourinary:  Positive for dysuria, frequency and hematuria.  Musculoskeletal:  Negative for arthralgias and back pain.  Skin:  Negative for color change and rash.  Neurological:  Negative for seizures and syncope.  All other systems reviewed and are negative.    Physical Exam Triage Vital Signs ED Triage Vitals  Encounter Vitals Group     BP 10/06/23 0838 126/85     Girls Systolic BP Percentile --      Girls Diastolic BP Percentile --      Boys Systolic BP Percentile --      Boys Diastolic BP Percentile --      Pulse Rate 10/06/23 0838 75     Resp 10/06/23 0838 20     Temp 10/06/23 0838 98.1 F (36.7 C)     Temp Source 10/06/23 0838 Oral     SpO2 10/06/23 0838 96 %     Weight --      Height --      Head Circumference --      Peak Flow --      Pain Score 10/06/23 0836 6     Pain Loc --      Pain Education --      Exclude from Growth Chart --    No data found.  Updated Vital Signs BP 126/85 (BP Location: Right Arm)   Pulse 75   Temp 98.1 F (36.7 C) (Oral)   Resp 20   SpO2 96%   Visual Acuity Right Eye Distance:   Left Eye Distance:   Bilateral Distance:    Right Eye Near:   Left Eye Near:    Bilateral Near:     Physical Exam Vitals and nursing note reviewed.  Constitutional:  General: She is not in acute distress.    Appearance: She is well-developed. She is not ill-appearing or toxic-appearing.  HENT:     Head: Normocephalic and atraumatic.     Right Ear: Hearing, tympanic membrane, ear canal and external ear normal.     Left Ear: Hearing, tympanic membrane, ear canal and external ear normal.     Nose: No congestion or rhinorrhea.     Right Sinus: No maxillary sinus tenderness or frontal sinus tenderness.     Left Sinus: No maxillary sinus tenderness or frontal sinus tenderness.     Mouth/Throat:      Lips: Pink.     Mouth: Mucous membranes are moist.     Pharynx: Uvula midline. No oropharyngeal exudate or posterior oropharyngeal erythema.     Tonsils: No tonsillar exudate.  Eyes:     Conjunctiva/sclera: Conjunctivae normal.     Pupils: Pupils are equal, round, and reactive to light.  Cardiovascular:     Rate and Rhythm: Normal rate and regular rhythm.     Heart sounds: S1 normal and S2 normal. No murmur heard. Pulmonary:     Effort: Pulmonary effort is normal. No respiratory distress.     Breath sounds: Normal breath sounds. No decreased breath sounds, wheezing, rhonchi or rales.  Abdominal:     General: Bowel sounds are normal.     Palpations: Abdomen is soft.     Tenderness: There is abdominal tenderness (Mild to moderate) in the right upper quadrant, right lower quadrant and suprapubic area. There is no right CVA tenderness, left CVA tenderness, guarding or rebound. Negative signs include Murphy's sign, Rovsing's sign and McBurney's sign.  Musculoskeletal:        General: No swelling.     Cervical back: Neck supple.  Lymphadenopathy:     Head:     Right side of head: No submental, submandibular, tonsillar, preauricular or posterior auricular adenopathy.     Left side of head: No submental, submandibular, tonsillar, preauricular or posterior auricular adenopathy.     Cervical: No cervical adenopathy.     Right cervical: No superficial cervical adenopathy.    Left cervical: No superficial cervical adenopathy.  Skin:    General: Skin is warm and dry.     Capillary Refill: Capillary refill takes less than 2 seconds.     Findings: No rash.  Neurological:     Mental Status: She is alert and oriented to person, place, and time.  Psychiatric:        Mood and Affect: Mood normal.      UC Treatments / Results  Labs (all labs ordered are listed, but only abnormal results are displayed) Labs Reviewed  POCT URINALYSIS DIP (MANUAL ENTRY) - Abnormal; Notable for the following  components:      Result Value   Blood, UA small (*)    Leukocytes, UA Trace (*)    All other components within normal limits  URINE CULTURE    EKG   Radiology No results found.  Procedures Procedures (including critical care time)  Medications Ordered in UC Medications - No data to display  Initial Impression / Assessment and Plan / UC Course  I have reviewed the triage vital signs and the nursing notes.  Pertinent labs & imaging results that were available during my care of the patient were reviewed by me and considered in my medical decision making (see chart for details).  Plan of Care: UTI: UA is abnormal.  Culture sent.  Will adjust the  plan of care, if needed once the culture results.  Nitrofurantoin  100 mg twice daily for 7 days.  See discharge instructions for more instructions.  Abdominal pain and hematuria: Make an appointment with primary care for recheck of urine and to discuss the hematuria.  She may need referral to urology.  Provided fluconazole , 150 mg, 1 pill now and repeat in 5 to 7 days, if needed for vaginal yeast infection.  Follow-up if symptoms do not improve, worsen or new symptoms occur.  I reviewed the plan of care with the patient and/or the patient's guardian.  The patient and/or guardian had time to ask questions and acknowledged that the questions were answered.  I provided instruction on symptoms or reasons to return here or to go to an ER, if symptoms/condition did not improve, worsened or if new symptoms occurred.  Final Clinical Impressions(s) / UC Diagnoses   Final diagnoses:  Right lower quadrant abdominal pain  Right upper quadrant abdominal pain  Acute suprapubic pain  Urinary tract infection with hematuria, site unspecified  Urinary frequency     Discharge Instructions      UTI with hematuria: Urinalysis is abnormal but does not clearly show infection.  Urine culture sent.  Will adjust the plan of care, if needed once the culture  results.  Will treat with nitrofurantoin  100 mg twice daily for 7 days.  Advised to monitor the portal for her test results.  If her culture is positive and will be treated by nitrofurantoin , then there is no change in plan of care and it will be available for review on the portal.  If her culture is negative, she will get a call requesting that she stop the nitrofurantoin .  If the culture is positive, but resistant to nitrofurantoin , then she will be called and alerted that she should stop the nitrofurantoin  and that we will have called in a new antibiotic for her.  Abdominal pain and hematuria: Abdominal x-ray was negative.  Will update the patient if the radiology review differs.  Advised that she should follow-up with her family practice and get a recheck urine in 2 to 3 weeks.  She may need referral to urology due to the hematuria.  Vaginal yeast infection: Patient does not have a yeast infection now.  However she has had yeast infections with antibiotic use and requested fluconazole  to use, if needed.  Follow-up if symptoms do not improve, worsen or new symptoms occur.     ED Prescriptions     Medication Sig Dispense Auth. Provider   nitrofurantoin , macrocrystal-monohydrate, (MACROBID ) 100 MG capsule Take 1 capsule (100 mg total) by mouth 2 (two) times daily. 10 capsule Ival Domino, FNP   fluconazole  (DIFLUCAN ) 150 MG tablet Take 1 tablet (150 mg) now.  Repeat in 5 to 7 days. 2 tablet Manveer Gomes, FNP      PDMP not reviewed this encounter.   Ival Domino, FNP 10/06/23 [phone]

## 2023-10-06 NOTE — Discharge Instructions (Addendum)
 UTI with hematuria: Urinalysis is abnormal but does not clearly show infection.  Urine culture sent.  Will adjust the plan of care, if needed once the culture results.  Will treat with nitrofurantoin  100 mg twice daily for 7 days.  Advised to monitor the portal for her test results.  If her culture is positive and will be treated by nitrofurantoin , then there is no change in plan of care and it will be available for review on the portal.  If her culture is negative, she will get a call requesting that she stop the nitrofurantoin .  If the culture is positive, but resistant to nitrofurantoin , then she will be called and alerted that she should stop the nitrofurantoin  and that we will have called in a new antibiotic for her.  Abdominal pain and hematuria: Abdominal x-ray was negative.  Will update the patient if the radiology review differs.  Advised that she should follow-up with her family practice and get a recheck urine in 2 to 3 weeks.  She may need referral to urology due to the hematuria.  Vaginal yeast infection: Patient does not have a yeast infection now.  However she has had yeast infections with antibiotic use and requested fluconazole  to use, if needed.  Follow-up if symptoms do not improve, worsen or new symptoms occur.

## 2023-10-06 NOTE — Progress Notes (Signed)
 Abdominal x-ray shows chronic enlargement of the liver and some stool and gas in the colon but is otherwise negative and no stone seen.  Patient was provided this update during the visit but I also called her and gave her this message via voicemail.  Follow-up here as needed.

## 2023-10-06 NOTE — ED Triage Notes (Signed)
 Pt c/o dysuria for the last 3 days. She noticed hematuria when she wiped last night. She is also having urinary frequency. She has not taken anything for the symptoms.

## 2023-10-07 LAB — URINE CULTURE: Culture: NO GROWTH

## 2023-10-07 MED ORDER — ESCITALOPRAM OXALATE 10 MG PO TABS: 10.0000 mg | ORAL_TABLET | Freq: Every day | ORAL | 2 refills | Status: DC

## 2023-10-09 IMAGING — MG MM DIGITAL SCREENING BILAT W/ TOMO AND CAD
8 series · 8 of 24 positions shown · non-contrast
Comparison: Previous exam(s).

ACR Breast Density Category a: The breast tissue is almost entirely
fatty.

CLINICAL DATA: Screening.

EXAM:
DIGITAL SCREENING BILATERAL MAMMOGRAM WITH TOMOSYNTHESIS AND CAD
TECHNIQUE: Bilateral screening digital craniocaudal and mediolateral oblique
mammograms were obtained. Bilateral screening digital breast
tomosynthesis was performed. The images were evaluated with
computer-aided detection.

[R MLO synth-2D]
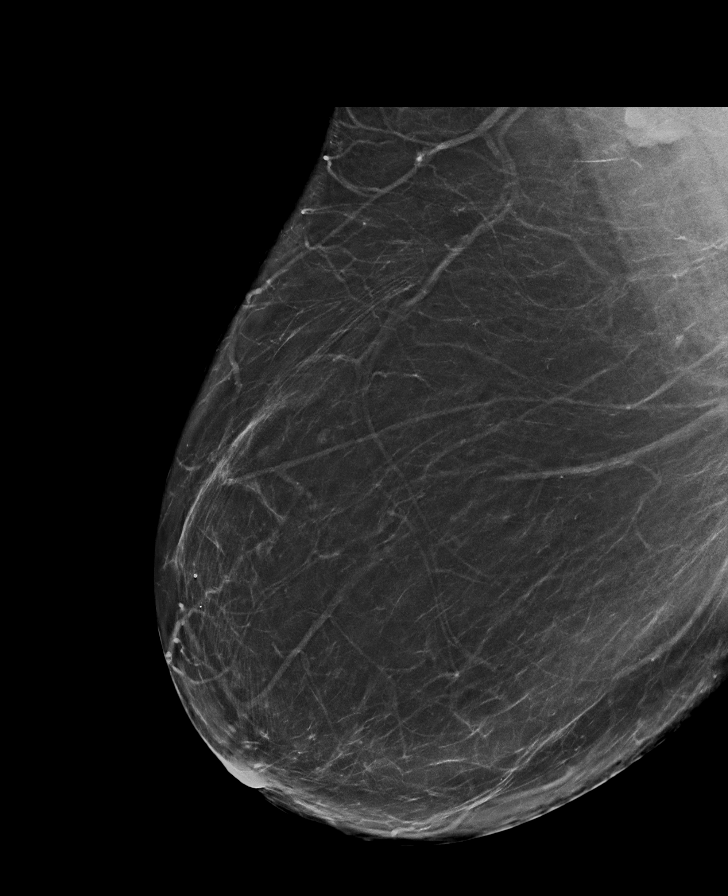

[L CC synth-2D]
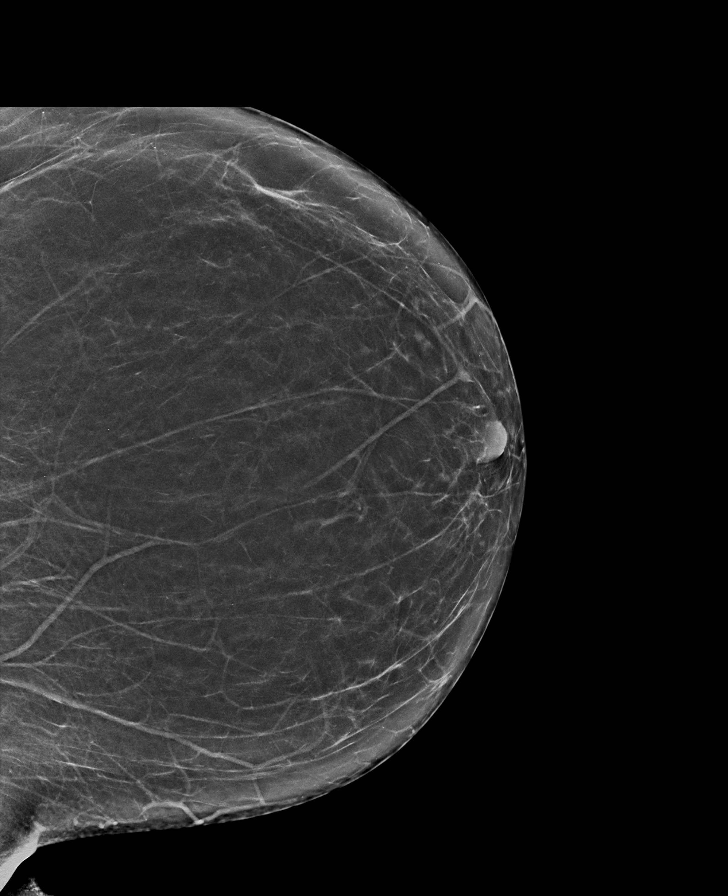

[L MLO synth-2D]
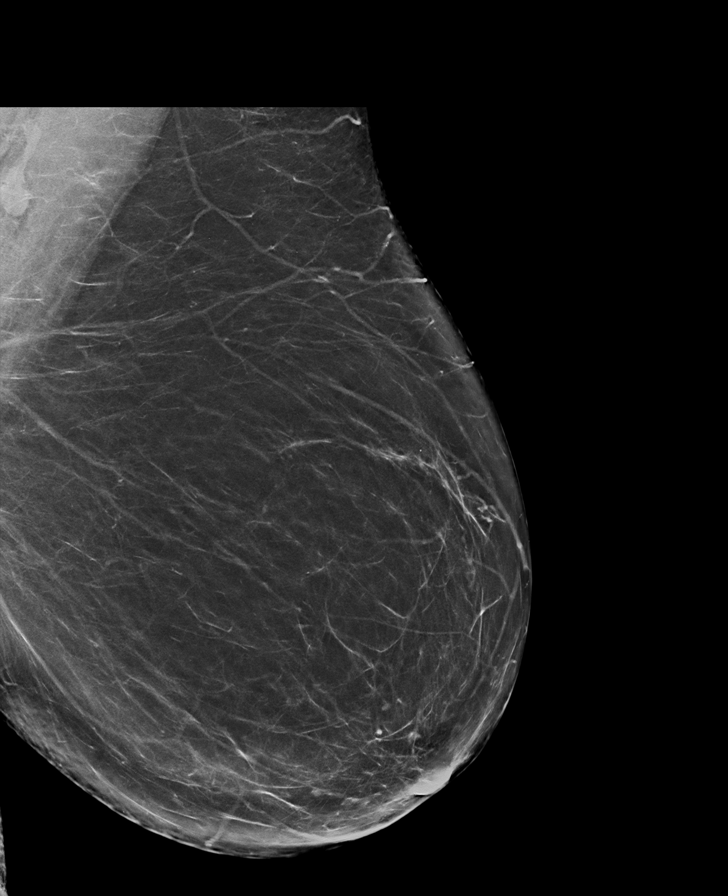

[R CC synth-2D]
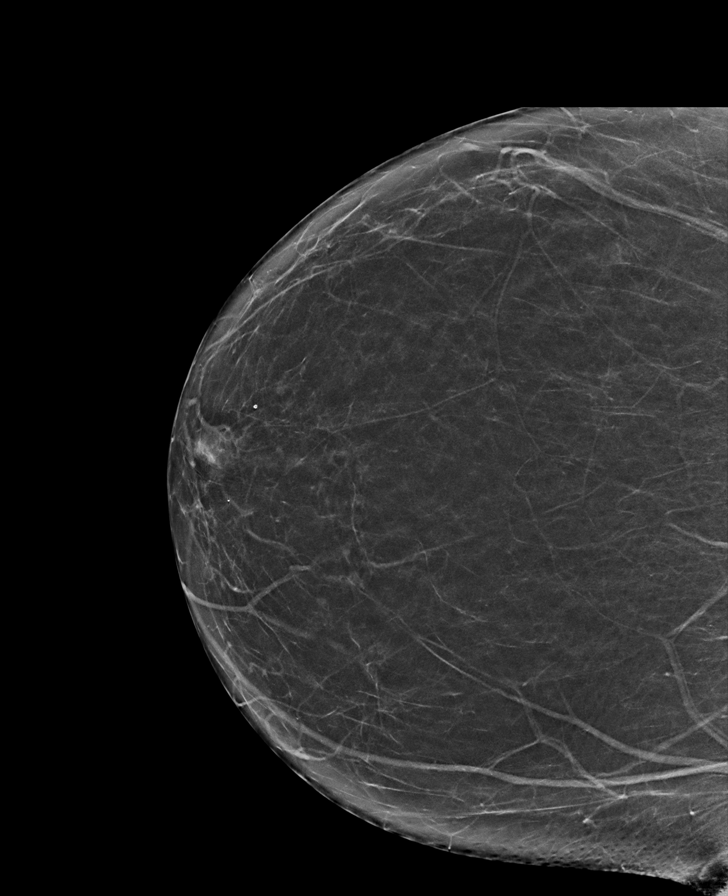

[L MLO tomo · tomo slice 41/81.0]
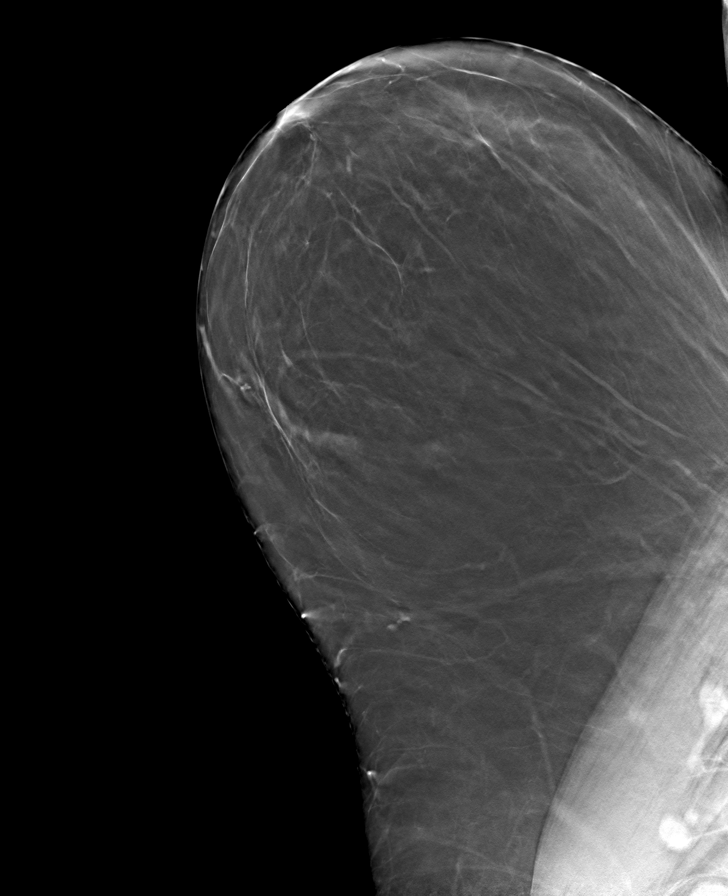

[R CC tomo · tomo slice 33/65.0]
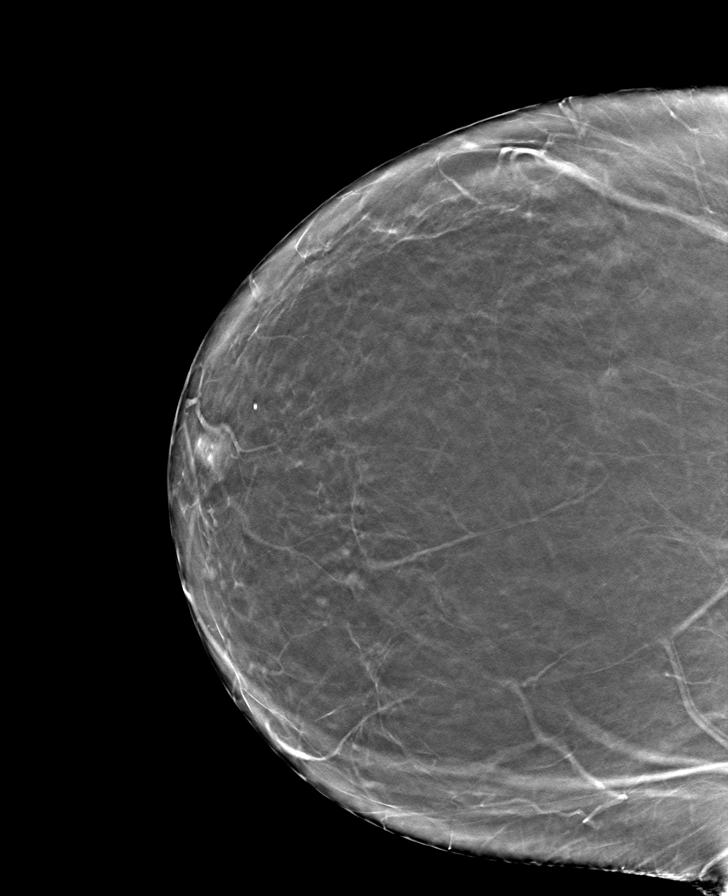

[R MLO tomo · tomo slice 43/85.0]
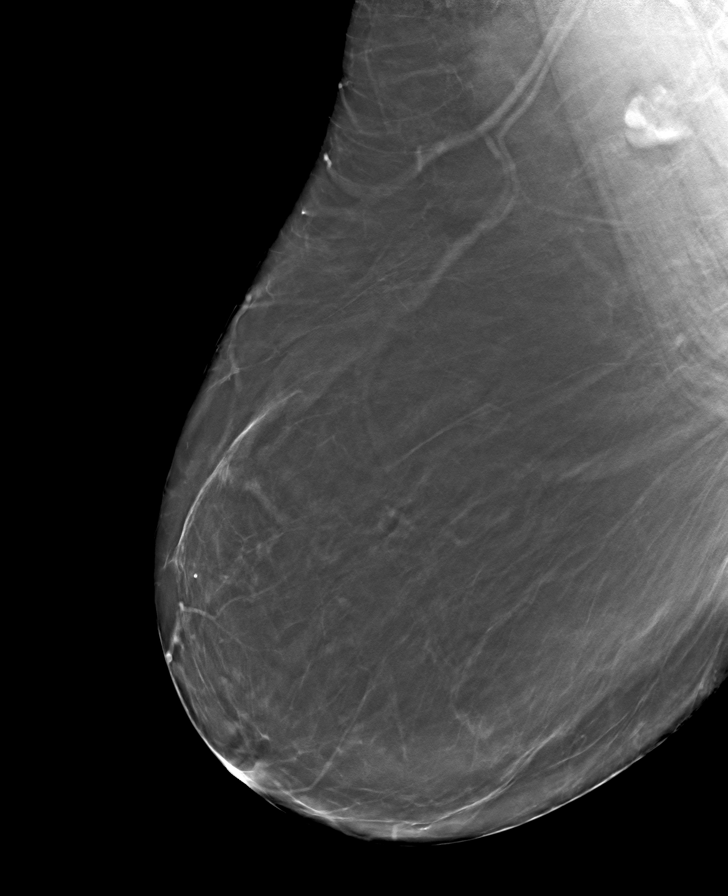

[L CC tomo · tomo slice 33/66.0]
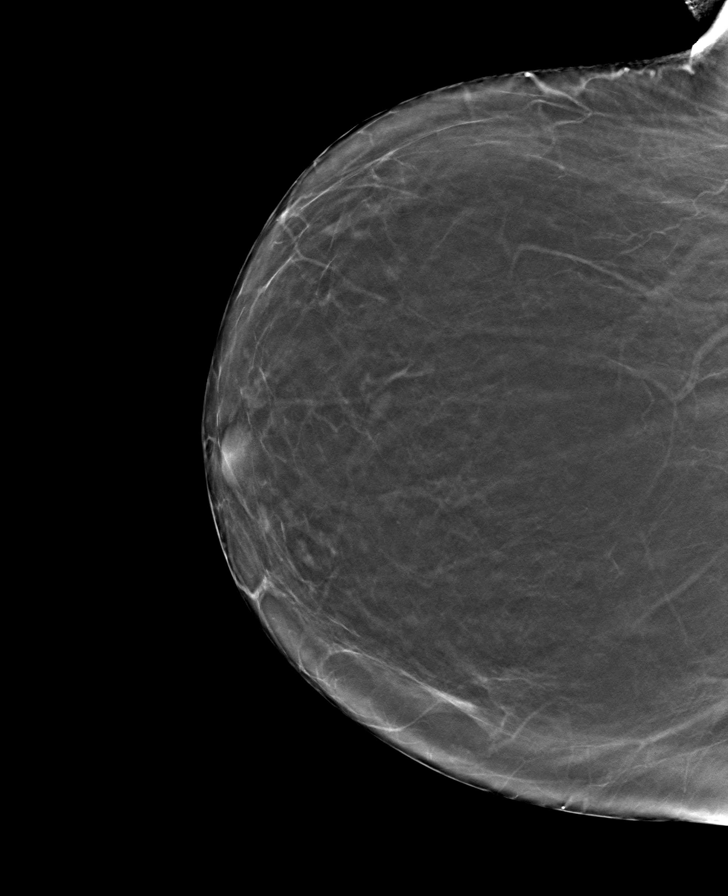

[8 of 24 positions shown; findings below may reference images not displayed]

FINDINGS: There are no findings suspicious for malignancy.
IMPRESSION: No mammographic evidence of malignancy. A result letter of this
screening mammogram will be mailed directly to the patient.

RECOMMENDATION:
Screening mammogram in one year. (Code:0E-3-N98)

BI-RADS CATEGORY  1: Negative.

## 2023-10-13 ENCOUNTER — Other Ambulatory Visit: Payer: Self-pay | Admitting: *Deleted

## 2023-10-13 DIAGNOSIS — M170 Bilateral primary osteoarthritis of knee: Secondary | ICD-10-CM

## 2023-10-13 MED ORDER — DICLOFENAC SODIUM 75 MG PO TBEC: 75.0000 mg | DELAYED_RELEASE_TABLET | Freq: Two times a day (BID) | ORAL | 1 refills | Status: DC

## 2023-10-14 ENCOUNTER — Encounter: Payer: Self-pay | Admitting: Internal Medicine

## 2023-10-14 ENCOUNTER — Other Ambulatory Visit: Payer: Self-pay

## 2023-10-14 MED ORDER — PANTOPRAZOLE SODIUM 40 MG PO TBEC: 40.0000 mg | DELAYED_RELEASE_TABLET | Freq: Every day | ORAL | 3 refills | Status: DC

## 2023-10-21 ENCOUNTER — Telehealth: Payer: PRIVATE HEALTH INSURANCE | Admitting: Physician Assistant

## 2023-10-21 DIAGNOSIS — J4541 Moderate persistent asthma with (acute) exacerbation: Secondary | ICD-10-CM | POA: Diagnosis not present

## 2023-10-21 DIAGNOSIS — B9689 Other specified bacterial agents as the cause of diseases classified elsewhere: Secondary | ICD-10-CM

## 2023-10-21 DIAGNOSIS — J069 Acute upper respiratory infection, unspecified: Secondary | ICD-10-CM

## 2023-10-21 MED ORDER — AZITHROMYCIN 250 MG PO TABS: ORAL_TABLET | ORAL | 0 refills | Status: AC

## 2023-10-21 MED ORDER — BENZONATATE 100 MG PO CAPS: 100.0000 mg | ORAL_CAPSULE | Freq: Three times a day (TID) | ORAL | 0 refills | Status: DC | PRN

## 2023-10-21 MED ORDER — PREDNISONE 20 MG PO TABS: 40.0000 mg | ORAL_TABLET | Freq: Every day | ORAL | 0 refills | Status: DC

## 2023-10-21 NOTE — Progress Notes (Signed)
 Virtual Visit Consent   Deborah Watts, you are scheduled for a virtual visit with a [facility] provider today. Just as with appointments in the office, your consent must be obtained to participate. Your consent will be active for this visit and any virtual visit you may have with one of our providers in the next 365 days. If you have a MyChart account, a copy of this consent can be sent to you electronically.  As this is a virtual visit, video technology does not allow for your provider to perform a traditional examination. This may limit your provider's ability to fully assess your condition. If your provider identifies any concerns that need to be evaluated in person or the need to arrange testing (such as labs, EKG, etc.), we will make arrangements to do so. Although advances in technology are sophisticated, we cannot ensure that it will always work on either your end or our end. If the connection with a video visit is poor, the visit may have to be switched to a telephone visit. With either a video or telephone visit, we are not always able to ensure that we have a secure connection.  By engaging in this virtual visit, you consent to the provision of healthcare and authorize for your insurance to be billed (if applicable) for the services provided during this visit. Depending on your insurance coverage, you may receive a charge related to this service.  I need to obtain your verbal consent now. Are you willing to proceed with your visit today? Deborah Watts has provided verbal consent on 10/21/2023 for a virtual visit (video or telephone). Delon CHRISTELLA Dickinson, PA-C  Date: 10/21/2023 10:08 AM   Virtual Visit via Video Note   I, Delon CHRISTELLA Dickinson, connected with  Deborah Watts  ([id number], [birth year]) on 10/21/23 at 10:00 AM EDT by a video-enabled telemedicine application and verified that I am speaking with the correct person using two identifiers.  Location: Patient: Virtual Visit Location  Patient: Home Provider: Virtual Visit Location Provider: Home Office   I discussed the limitations of evaluation and management by telemedicine and the availability of in person appointments. The patient expressed understanding and agreed to proceed.    History of Present Illness: Deborah Watts is a 58 y.o. who identifies as a female who was assigned female at birth, and is being seen today for cough and asthma.  HPI: Cough This is a new problem. The current episode started in the past 7 days. The problem has been gradually worsening. The problem occurs every few minutes. The cough is Non-productive. Associated symptoms include chest pain (tightness), ear congestion, headaches, myalgias (mild), nasal congestion, postnasal drip, rhinorrhea, shortness of breath and wheezing (worse than normal). Pertinent negatives include no chills, ear pain, fever, sore throat or sweats. The symptoms are aggravated by lying down. She has tried a beta-agonist inhaler (albuterol  inhaler, advair twice daily, montelukast  nightly) for the symptoms. The treatment provided no relief. Her past medical history is significant for asthma.  BP:  135/87 95 O2 sat 78 HR, 98 O2 most recent   Problems:  Patient Active Problem List   Diagnosis Date Noted   ANA positive 04/23/2023   Right upper quadrant abdominal pain 03/12/2023   Complex small left ovarian cyst on ultrasound 03/12/2023   Left lower quadrant abdominal pain 03/02/2023   Urinary frequency 03/02/2023   Iron deficiency anemia 03/02/2023   Pulmonary nodules 02/20/2023   Suspected sleep apnea 02/20/2023   Allergic rhinitis 02/20/2023  Pain of left calf 08/12/2022   Cellulitis of left lower extremity 08/12/2022   Edema, lower extremity 07/21/2022   SOB (shortness of breath) 07/21/2022   Hyperlipidemia 07/21/2022   Major depressive disorder, recurrent episode, moderate (HCC) 11/25/2021   Generalized anxiety disorder 11/25/2021   Depression with anxiety  10/28/2021   Asthma, chronic, unspecified asthma severity, uncomplicated 05/02/2021   Loud snoring 11/28/2019   Daytime sleepiness 11/28/2019   Other fatigue 11/28/2019   Primary osteoarthritis of left knee 08/02/2019   Rib pain 10/19/2018   Right knee pain 04/12/2017   Preventative health care 03/25/2016   Left ankle pain 01/14/2016   Left leg pain 04/28/2014   Pain of right calf 04/20/2014   Obesity (BMI 30-39.9) 01/06/2013   Thrush, oral 03/04/2011   TINEA CRURIS 02/05/2010   HYPOKALEMIA 04/26/2009   Primary hypertension 02/08/2008   LOW BACK PAIN SYNDROME 05/26/2007   LEG CRAMPS 05/14/2007   Edema 05/14/2007   Chronic migraine without aura, with status migrainosus 05/01/2007   ASTHMATIC BRONCHITIS, ACUTE 10/15/2006   Anxiety state 04/20/2006   RESTLESS LEG SYNDROME, HX OF 04/20/2006    Allergies:  Allergies  Allergen Reactions   Fish Allergy Hives and Swelling   Iohexol  Other (See Comments)     Desc: hives, sob, pt. needs 13 hr prep   01/16/05    Ivp Dye [Iodinated Contrast Media] Hives and Swelling   Penicillins Anaphylaxis, Hives and Other (See Comments)    Has patient had a PCN reaction causing immediate rash, facial/tongue/throat swelling, SOB or lightheadedness with hypotension: Unknown Has patient had a PCN reaction causing severe rash involving mucus membranes or skin necrosis: No Has patient had a PCN reaction that required hospitalization: Yes Has patient had a PCN reaction occurring within the last 10 years: No If all of the above answers are NO, then may proceed with Cephalosporin use.    Tizanidine Hcl Nausea And Vomiting, Rash and Shortness Of Breath   Morphine  And Codeine  Nausea And Vomiting   Medications:  Current Outpatient Medications:    azithromycin  (ZITHROMAX ) 250 MG tablet, Take 2 tablets on day 1, then 1 tablet daily on days 2 through 5, Disp: 6 tablet, Rfl: 0   benzonatate  (TESSALON ) 100 MG capsule, Take 1-2 capsules (100-200 mg total) by  mouth 3 (three) times daily as needed., Disp: 30 capsule, Rfl: 0   pantoprazole  (PROTONIX ) 40 MG tablet, Take 1 tablet (40 mg total) by mouth daily., Disp: 30 tablet, Rfl: 3   predniSONE  (DELTASONE ) 20 MG tablet, Take 2 tablets (40 mg total) by mouth daily with breakfast., Disp: 14 tablet, Rfl: 0   albuterol  (PROVENTIL ) (2.5 MG/3ML) 0.083% nebulizer solution, Take 3 mLs (2.5 mg total) by nebulization every 6 (six) hours as needed for wheezing or shortness of breath., Disp: 150 mL, Rfl: 1   albuterol  (VENTOLIN  HFA) 108 (90 Base) MCG/ACT inhaler, Inhale 2 puffs into the lungs every 6 (six) hours as needed for wheezing or shortness of breath (Cough)., Disp: 18 g, Rfl: 3   ALPRAZolam  (XANAX ) 0.5 MG tablet, TAKE 1 TABLET BY MOUTH THREE TIMES PER DAY AS NEEDED, Disp: 90 tablet, Rfl: 1   azelastine  (ASTELIN ) 0.1 % nasal spray, Place 1 spray into both nostrils 2 (two) times daily. Use in each nostril as directed, Disp: 30 mL, Rfl: 12   clotrimazole -betamethasone  (LOTRISONE ) cream, Apply 1 Application topically daily., Disp: 30 g, Rfl: 0   diclofenac  (VOLTAREN ) 75 MG EC tablet, Take 1 tablet (75 mg total) by mouth 2 (  two) times daily., Disp: 60 tablet, Rfl: 1   escitalopram  (LEXAPRO ) 10 MG tablet, Take 1 tablet (10 mg total) by mouth daily., Disp: 30 tablet, Rfl: 2   fluconazole  (DIFLUCAN ) 150 MG tablet, Take 1 tablet (150 mg) now.  Repeat in 5 to 7 days., Disp: 2 tablet, Rfl: 0   fluticasone  (FLONASE ) 50 MCG/ACT nasal spray, Place 2 sprays into both nostrils daily., Disp: 16 g, Rfl: 0   furosemide  (LASIX ) 40 MG tablet, Take 1 tablet (40 mg total) by mouth daily., Disp: 30 tablet, Rfl: 3   HYDROcodone -acetaminophen  (NORCO/VICODIN) 5-325 MG tablet, Take 1 tablet by mouth every 6 (six) hours as needed for moderate pain (pain score 4-6)., Disp: 20 tablet, Rfl: 0   ipratropium (ATROVENT ) 0.03 % nasal spray, Place 2 sprays into both nostrils every 12 (twelve) hours., Disp: 30 mL, Rfl: 0   Meclizine  HCl (TRAVEL  SICKNESS) 25 MG CHEW, CHEW 1 TABLET EVERY 6 HOURS AS NEEDED, Disp: 30 tablet, Rfl: 1   methocarbamol  (ROBAXIN -750) 750 MG tablet, Take 1 tablet (750 mg total) by mouth every 6 (six) hours as needed for muscle spasms., Disp: , Rfl:    montelukast  (SINGULAIR ) 10 MG tablet, Take 1 tablet (10 mg total) by mouth at bedtime., Disp: 90 tablet, Rfl: 3   nystatin  (MYCOSTATIN ) 100000 UNIT/ML suspension, Take 5 mLs (500,000 Units total) by mouth 4 (four) times daily., Disp: 60 mL, Rfl: 0   pregabalin  (LYRICA ) 300 MG capsule, Take 1 capsule (300 mg total) by mouth 2 (two) times daily., Disp: 180 capsule, Rfl: 1   SUMAtriptan  (IMITREX ) 100 MG tablet, TAKE 1 TABLET BY MOUTH AS NEEDED FOR MIGRAINE. REPEAT IN 2 HOURS IF NEEDED, Disp: 12 tablet, Rfl: 3   topiramate  (TOPAMAX ) 50 MG tablet, Take 1 tablet (50 mg total) by mouth 2 (two) times daily., Disp: 180 tablet, Rfl: 1   traMADol  (ULTRAM ) 50 MG tablet, Take 1 tablet (50 mg total) by mouth every 8 (eight) hours as needed., Disp: 30 tablet, Rfl: 0  Observations/Objective: Patient is well-developed, well-nourished in no acute distress.  Resting comfortably at home.  Head is normocephalic, atraumatic.  No labored breathing.  Speech is clear and coherent with logical content.  Patient is alert and oriented at baseline.    Assessment and Plan: 1. Bacterial upper respiratory infection (Primary) - azithromycin  (ZITHROMAX ) 250 MG tablet; Take 2 tablets on day 1, then 1 tablet daily on days 2 through 5  Dispense: 6 tablet; Refill: 0 - predniSONE  (DELTASONE ) 20 MG tablet; Take 2 tablets (40 mg total) by mouth daily with breakfast.  Dispense: 14 tablet; Refill: 0 - benzonatate  (TESSALON ) 100 MG capsule; Take 1-2 capsules (100-200 mg total) by mouth 3 (three) times daily as needed.  Dispense: 30 capsule; Refill: 0  2. Moderate persistent asthma with exacerbation - azithromycin  (ZITHROMAX ) 250 MG tablet; Take 2 tablets on day 1, then 1 tablet daily on days 2 through 5   Dispense: 6 tablet; Refill: 0 - predniSONE  (DELTASONE ) 20 MG tablet; Take 2 tablets (40 mg total) by mouth daily with breakfast.  Dispense: 14 tablet; Refill: 0 - benzonatate  (TESSALON ) 100 MG capsule; Take 1-2 capsules (100-200 mg total) by mouth 3 (three) times daily as needed.  Dispense: 30 capsule; Refill: 0  - Worsening over a week despite OTC medications - Will treat with Z-pack, Prednisone  and tessalon  perles - Can add Mucinex  - Continue asthma medications and inhalers as prescribed - Push fluids.  - Rest.  - Steam and humidifier can  help - Seek in person evaluation if worsening or symptoms fail to improve    Follow Up Instructions: I discussed the assessment and treatment plan with the patient. The patient was provided an opportunity to ask questions and all were answered. The patient agreed with the plan and demonstrated an understanding of the instructions.  A copy of instructions were sent to the patient via MyChart unless otherwise noted below.    The patient was advised to call back or seek an in-person evaluation if the symptoms worsen or if the condition fails to improve as anticipated.    Delon CHRISTELLA Dickinson, PA-C

## 2023-10-21 NOTE — Patient Instructions (Signed)
 Deborah Watts Deborah Watts, Deborah you for joining Delon CHRISTELLA Dickinson, Deborah Watts for today's virtual visit.  While this provider is not your primary care provider (PCP), if your PCP is located in our provider database this encounter information will be shared with them immediately following your visit.   A [facility] MyChart account gives you access to today's visit and all your visits, tests, and labs performed at [facility]  click here if you don't have a [facility] MyChart account or go to [email or web address]  Consent: (Patient) Deborah Watts provided verbal consent for this virtual visit at the beginning of the encounter.  Current Medications:  Current Outpatient Medications:    azithromycin  (ZITHROMAX ) 250 MG tablet, Take 2 tablets on day 1, then 1 tablet daily on days 2 through 5, Disp: 6 tablet, Rfl: 0   benzonatate  (TESSALON ) 100 MG capsule, Take 1-2 capsules (100-200 mg total) by mouth 3 (three) times daily as needed., Disp: 30 capsule, Rfl: 0   pantoprazole  (PROTONIX ) 40 MG tablet, Take 1 tablet (40 mg total) by mouth daily., Disp: 30 tablet, Rfl: 3   predniSONE  (DELTASONE ) 20 MG tablet, Take 2 tablets (40 mg total) by mouth daily with breakfast., Disp: 14 tablet, Rfl: 0   albuterol  (PROVENTIL ) (2.5 MG/3ML) 0.083% nebulizer solution, Take 3 mLs (2.5 mg total) by nebulization every 6 (six) hours as needed for wheezing or shortness of breath., Disp: 150 mL, Rfl: 1   albuterol  (VENTOLIN  HFA) 108 (90 Base) MCG/ACT inhaler, Inhale 2 puffs into the lungs every 6 (six) hours as needed for wheezing or shortness of breath (Cough)., Disp: 18 g, Rfl: 3   ALPRAZolam  (XANAX ) 0.5 MG tablet, TAKE 1 TABLET BY MOUTH THREE TIMES PER DAY AS NEEDED, Disp: 90 tablet, Rfl: 1   azelastine  (ASTELIN ) 0.1 % nasal spray, Place 1 spray into both nostrils 2 (two) times daily. Use in each nostril as directed, Disp: 30 mL, Rfl: 12   clotrimazole -betamethasone  (LOTRISONE ) cream, Apply 1 Application topically  daily., Disp: 30 g, Rfl: 0   diclofenac  (VOLTAREN ) 75 MG EC tablet, Take 1 tablet (75 mg total) by mouth 2 (two) times daily., Disp: 60 tablet, Rfl: 1   escitalopram  (LEXAPRO ) 10 MG tablet, Take 1 tablet (10 mg total) by mouth daily., Disp: 30 tablet, Rfl: 2   fluconazole  (DIFLUCAN ) 150 MG tablet, Take 1 tablet (150 mg) now.  Repeat in 5 to 7 days., Disp: 2 tablet, Rfl: 0   fluticasone  (FLONASE ) 50 MCG/ACT nasal spray, Place 2 sprays into both nostrils daily., Disp: 16 g, Rfl: 0   furosemide  (LASIX ) 40 MG tablet, Take 1 tablet (40 mg total) by mouth daily., Disp: 30 tablet, Rfl: 3   HYDROcodone -acetaminophen  (NORCO/VICODIN) 5-325 MG tablet, Take 1 tablet by mouth every 6 (six) hours as needed for moderate pain (pain score 4-6)., Disp: 20 tablet, Rfl: 0   ipratropium (ATROVENT ) 0.03 % nasal spray, Place 2 sprays into both nostrils every 12 (twelve) hours., Disp: 30 mL, Rfl: 0   Meclizine  HCl (TRAVEL SICKNESS) 25 MG CHEW, CHEW 1 TABLET EVERY 6 HOURS AS NEEDED, Disp: 30 tablet, Rfl: 1   methocarbamol  (ROBAXIN -750) 750 MG tablet, Take 1 tablet (750 mg total) by mouth every 6 (six) hours as needed for muscle spasms., Disp: , Rfl:    montelukast  (SINGULAIR ) 10 MG tablet, Take 1 tablet (10 mg total) by mouth at bedtime., Disp: 90 tablet, Rfl: 3   nystatin  (MYCOSTATIN ) 100000 UNIT/ML suspension, Take 5 mLs (500,000 Units total) by mouth 4 (four)  times daily., Disp: 60 mL, Rfl: 0   pregabalin  (LYRICA ) 300 MG capsule, Take 1 capsule (300 mg total) by mouth 2 (two) times daily., Disp: 180 capsule, Rfl: 1   SUMAtriptan  (IMITREX ) 100 MG tablet, TAKE 1 TABLET BY MOUTH AS NEEDED FOR MIGRAINE. REPEAT IN 2 HOURS IF NEEDED, Disp: 12 tablet, Rfl: 3   topiramate  (TOPAMAX ) 50 MG tablet, Take 1 tablet (50 mg total) by mouth 2 (two) times daily., Disp: 180 tablet, Rfl: 1   traMADol  (ULTRAM ) 50 MG tablet, Take 1 tablet (50 mg total) by mouth every 8 (eight) hours as needed., Disp: 30 tablet, Rfl: 0   Medications ordered in  this encounter:  Meds ordered this encounter  Medications   azithromycin  (ZITHROMAX ) 250 MG tablet    Sig: Take 2 tablets on day 1, then 1 tablet daily on days 2 through 5    Dispense:  6 tablet    Refill:  0    Supervising Provider:   LAMPTEY, PHILIP O [[id number]]   predniSONE  (DELTASONE ) 20 MG tablet    Sig: Take 2 tablets (40 mg total) by mouth daily with breakfast.    Dispense:  14 tablet    Refill:  0    Supervising Provider:   BLAISE ALEENE KIDD [[id number]]   benzonatate  (TESSALON ) 100 MG capsule    Sig: Take 1-2 capsules (100-200 mg total) by mouth 3 (three) times daily as needed.    Dispense:  30 capsule    Refill:  0    Supervising Provider:   BLAISE ALEENE KIDD [[id number]]     *If you need refills on other medications prior to your next appointment, please contact your pharmacy*  Follow-Up: Call back or seek an in-person evaluation if the symptoms worsen or if the condition fails to improve as anticipated.  [facility] [phone]  Other Instructions Upper Respiratory Infection, Adult An upper respiratory infection (URI) is a common viral infection of the nose, throat, and upper air passages that lead to the lungs. The most common type of URI is the common cold. URIs usually get better on their own, without medical treatment. What are the causes? A URI is caused by a virus. You may catch a virus by: Breathing in droplets from an infected person's cough or sneeze. Touching something that has been exposed to the virus (is contaminated) and then touching your mouth, nose, or eyes. What increases the risk? You are more likely to get a URI if: You are very young or very old. You have close contact with others, such as at work, school, or a health care facility. You smoke. You have long-term (chronic) heart or lung disease. You have a weakened disease-fighting system (immune system). You have nasal allergies or asthma. You are experiencing a lot of stress. You  have poor nutrition. What are the signs or symptoms? A URI usually involves some of the following symptoms: Runny or stuffy (congested) nose. Cough. Sneezing. Sore throat. Headache. Fatigue. Fever. Loss of appetite. Pain in your forehead, behind your eyes, and over your cheekbones (sinus pain). Muscle aches. Redness or irritation of the eyes. Pressure in the ears or face. How is this diagnosed? This condition may be diagnosed based on your medical history and symptoms, and a physical exam. Your health care provider may use a swab to take a mucus sample from your nose (nasal swab). This sample can be tested to determine what virus is causing the illness. How is this treated? URIs usually get  better on their own within 7-10 days. Medicines cannot cure URIs, but your health care provider may recommend certain medicines to help relieve symptoms, such as: Over-the-counter cold medicines. Cough suppressants. Coughing is a type of defense against infection that helps to clear the respiratory system, so take these medicines only as recommended by your health care provider. Fever-reducing medicines. Follow these instructions at home: Activity Rest as needed. If you have a fever, stay home from work or school until your fever is gone or until your health care provider says your URI cannot spread to other people (is no longer contagious). Your health care provider may have you wear a face mask to prevent your infection from spreading. Relieving symptoms Gargle with a mixture of salt and water 3-4 times a day or as needed. To make salt water, completely dissolve -1 tsp (3-6 g) of salt in 1 cup (237 mL) of warm water. Use a cool-mist humidifier to add moisture to the air. This can help you breathe more easily. Eating and drinking  Drink enough fluid to keep your urine pale yellow. Eat soups and other clear broths. General instructions  Take over-the-counter and prescription medicines only as  told by your health care provider. These include cold medicines, fever reducers, and cough suppressants. Do not use any products that contain nicotine or tobacco. These products include cigarettes, chewing tobacco, and vaping devices, such as e-cigarettes. If you need help quitting, ask your health care provider. Stay away from secondhand smoke. Stay up to date on all immunizations, including the yearly (annual) flu vaccine. Keep all follow-up visits. This is important. How to prevent the spread of infection to others URIs can be contagious. To prevent the infection from spreading: Wash your hands with soap and water for at least 20 seconds. If soap and water are not available, use hand sanitizer. Avoid touching your mouth, face, eyes, or nose. Cough or sneeze into a tissue or your sleeve or elbow instead of into your hand or into the air.  Contact a health care provider if: You are getting worse instead of better. You have a fever or chills. Your mucus is brown or red. You have yellow or brown discharge coming from your nose. You have pain in your face, especially when you bend forward. You have swollen neck glands. You have pain while swallowing. You have white areas in the back of your throat. Get help right away if: You have shortness of breath that gets worse. You have severe or persistent: Headache. Ear pain. Sinus pain. Chest pain. You have chronic lung disease along with any of the following: Making high-pitched whistling sounds when you breathe, most often when you breathe out (wheezing). Prolonged cough (more than 14 days). Coughing up blood. A change in your usual mucus. You have a stiff neck. You have changes in your: Vision. Hearing. Thinking. Mood. These symptoms may be an emergency. Get help right away. Call 911. Do not wait to see if the symptoms will go away. Do not drive yourself to the hospital. Summary An upper respiratory infection (URI) is a common  infection of the nose, throat, and upper air passages that lead to the lungs. A URI is caused by a virus. URIs usually get better on their own within 7-10 days. Medicines cannot cure URIs, but your health care provider may recommend certain medicines to help relieve symptoms. This information is not intended to replace advice given to you by your health care provider. Make sure you discuss any  questions you have with your health care provider. Document Revised: 10/03/2020 Document Reviewed: 10/03/2020 Elsevier Patient Education  2024 [employer]   If you have been instructed to have an in-person evaluation today at a local Urgent Care facility, please use the link below. It will take you to a list of all of our available [facility], including address, phone number and hours of operation. Please do not delay care.  [facility]  If you or a family member do not have a primary care provider, use the link below to schedule a visit and establish care. When you choose a [facility] primary care physician or advanced practice provider, you gain a long-term partner in health. Find a Primary Care Provider  Learn more about [facility]'s in-office and virtual care options: [facility] - Get Care Now

## 2023-11-06 ENCOUNTER — Ambulatory Visit: Payer: PRIVATE HEALTH INSURANCE | Admitting: Family Medicine

## 2023-11-09 ENCOUNTER — Encounter: Payer: Self-pay | Admitting: Family Medicine

## 2023-11-09 ENCOUNTER — Ambulatory Visit: Payer: PRIVATE HEALTH INSURANCE | Admitting: Family Medicine

## 2023-11-09 VITALS — BP 112/78 | HR 91 | Temp 98.2°F | Resp 20 | Ht 59.0 in | Wt 226.2 lb

## 2023-11-09 DIAGNOSIS — I10 Essential (primary) hypertension: Secondary | ICD-10-CM | POA: Diagnosis not present

## 2023-11-09 DIAGNOSIS — D509 Iron deficiency anemia, unspecified: Secondary | ICD-10-CM | POA: Diagnosis not present

## 2023-11-09 DIAGNOSIS — F411 Generalized anxiety disorder: Secondary | ICD-10-CM

## 2023-11-09 DIAGNOSIS — F418 Other specified anxiety disorders: Secondary | ICD-10-CM

## 2023-11-09 DIAGNOSIS — Z6841 Body Mass Index (BMI) 40.0 and over, adult: Secondary | ICD-10-CM | POA: Diagnosis not present

## 2023-11-09 LAB — COMPREHENSIVE METABOLIC PANEL WITH GFR
ALT: 25 U/L (ref 0–35)
AST: 22 U/L (ref 0–37)
Albumin: 4.1 g/dL (ref 3.5–5.2)
Alkaline Phosphatase: 101 U/L (ref 39–117)
BUN: 10 mg/dL (ref 6–23)
CO2: 28 meq/L (ref 19–32)
Calcium: 8.7 mg/dL (ref 8.4–10.5)
Chloride: 104 meq/L (ref 96–112)
Creatinine, Ser: 0.83 mg/dL (ref 0.40–1.20)
GFR: 77.19 mL/min (ref >60.00)
Glucose, Bld: 92 mg/dL (ref 70–99)
Potassium: 4.1 meq/L (ref 3.5–5.1)
Sodium: 142 meq/L (ref 135–145)
Total Bilirubin: 0.4 mg/dL (ref 0.2–1.2)
Total Protein: 6.7 g/dL (ref 6.0–8.3)

## 2023-11-09 LAB — LIPID PANEL
Cholesterol: 159 mg/dL (ref 0–200)
HDL: 28.6 mg/dL — ABNORMAL LOW (ref >39.00)
LDL Cholesterol: 96 mg/dL (ref 0–99)
NonHDL: 130.54
Total CHOL/HDL Ratio: 6
Triglycerides: 173 mg/dL — ABNORMAL HIGH (ref 0.0–149.0)
VLDL: 34.6 mg/dL (ref 0.0–40.0)

## 2023-11-09 LAB — CBC WITH DIFFERENTIAL/PLATELET
Basophils Absolute: 0.1 K/uL (ref 0.0–0.1)
Basophils Relative: 0.7 % (ref 0.0–3.0)
Eosinophils Absolute: 0.1 K/uL (ref 0.0–0.7)
Eosinophils Relative: 1.4 % (ref 0.0–5.0)
HCT: 42.5 % (ref 36.0–46.0)
Hemoglobin: 13.9 g/dL (ref 12.0–15.0)
Lymphocytes Relative: 10.2 % — ABNORMAL LOW (ref 12.0–46.0)
Lymphs Abs: 0.9 K/uL (ref 0.7–4.0)
MCHC: 32.8 g/dL (ref 30.0–36.0)
MCV: 87.6 fl (ref 78.0–100.0)
Monocytes Absolute: 0.6 K/uL (ref 0.1–1.0)
Monocytes Relative: 6.3 % (ref 3.0–12.0)
Neutro Abs: 7.3 K/uL (ref 1.4–7.7)
Neutrophils Relative %: 81.4 % — ABNORMAL HIGH (ref 43.0–77.0)
Platelets: 235 K/uL (ref 150.0–400.0)
RBC: 4.86 Mil/uL (ref 3.87–5.11)
RDW: 15.1 % (ref 11.5–15.5)
WBC: 9 K/uL (ref 4.0–10.5)

## 2023-11-09 LAB — VITAMIN B12: Vitamin B-12: 267 pg/mL (ref 211–911)

## 2023-11-09 LAB — TSH: TSH: 0.55 u[IU]/mL (ref 0.35–5.50)

## 2023-11-09 LAB — HEMOGLOBIN A1C: Hgb A1c MFr Bld: 6.6 % — ABNORMAL HIGH (ref 4.6–6.5)

## 2023-11-09 LAB — VITAMIN D 25 HYDROXY (VIT D DEFICIENCY, FRACTURES): VITD: 21.15 ng/mL — ABNORMAL LOW (ref 30.00–100.00)

## 2023-11-09 MED ORDER — ALPRAZOLAM 0.5 MG PO TABS: ORAL_TABLET | ORAL | 1 refills | Status: DC

## 2023-11-09 MED ORDER — ESCITALOPRAM OXALATE 20 MG PO TABS: 20.0000 mg | ORAL_TABLET | Freq: Every day | ORAL | 3 refills | Status: AC

## 2023-11-09 NOTE — Patient Instructions (Signed)
 Depression Screening: What to Know Depression screening is something your health care provider can use to help find out if you have signs or symptoms of depression. Depression, or symptoms of depression, can: Make it hard to do everyday things. Raise the chance of having heart problems. Make other health issues worse. Be caused by physical conditions that could require treatment, like hypothyroidism, long-term pain, heart disease, and cancer. What are the screening tests? There are many types of depression screenings, but they all usually involve questions that: Your provider asks you directly. You answer yourself, like on paper or on a computer. Who should be screened for depression? Everyone over 5 years old should be screened for depression, especially if they: Have a long-term condition or illness. Are recovering from a serious illness or injury. Are pregnant or just had a baby. Have another mental health condition. Think they have signs or symptoms of depression, like being very sad or down. What do my results mean? A positive result means that you have some signs or symptoms of depression, but doesn't always mean you have depression. If you have a positive result, next steps can include: Answering more questions about your symptoms and what is happening in your life. Doing a physical check-up or taking blood or pee (urine) samples to find out if something other than depression might be causing your symptoms. Referring you to a mental health specialist. Because the results come from the answers you give to the screening questions, be honest so that you and your provider can work together to figure out the next best steps for you. Get help right away if: You feel like you may hurt yourself or others. You have thoughts about taking your own life. You have thoughts or feelings that worry you. These symptoms may be an emergency. Take one of these steps right away: Go to your nearest  emergency room. Call 911. Contact the Suicide & Crisis Lifeline (24/7, free and confidential): Call or text 988. Chat online at chat.NewsActor.se. For Veterans and their loved ones: Call 988 and press 1. Text the [employer] at 360 007 1999. Chat online at ReservationsList.si. This information is not intended to replace advice given to you by your health care provider. Make sure you discuss any questions you have with your health care provider. Document Revised: 06/28/2023 Document Reviewed: 06/28/2023 Elsevier Patient Education  2025 [employer].

## 2023-11-09 NOTE — Progress Notes (Signed)
 Subjective:    Patient ID: Deborah Watts, female    DOB: 03-29-1965, 58 y.o.   MRN: [id number]  Chief Complaint  Patient presents with   Depression   Follow-up    HPI Patient is in today for f/u depression/ anxiety Discussed the use of AI scribe software for clinical note transcription with the patient, who gave verbal consent to proceed.  History of Present Illness Deborah Watts is a 58 year old female who presents with persistent depression and weight management concerns.  She experiences persistent symptoms of depression, including feeling down, depressed, or hopeless, particularly when job applications are unsuccessful. She has trouble sleeping, sometimes sleeping excessively, and occasionally feels tired or has little energy. No thoughts of self-harm. She has been on Lexapro , initially at 10 mg, which was increased to 20 mg after stopping Prozac  due to hand tremors. She is not currently seeing a counselor due to financial constraints and unemployment.  She has been actively working on [employer] since joining [employer] in September. Her regimen includes a diet plan focusing on protein and vegetables, and she has lost approximately 10 pounds, reducing her weight from 236 to 226 pounds. She exercises regularly and drinks over 60 ounces of water daily. Her mother-in-law is financially supporting her participation in the program.  She applied for Medicaid to assist with insurance costs but was only approved for family planning services, which she finds inadequate for her needs at age 11. She has a follow-up appointment with Dr. Fronie in October and mentions a past procedure where her esophagus was stretched and her hernia repair remains intact.   Past Medical History:  Diagnosis Date   Anxiety    Arthritis    Asthma    Headache    migraines   History of hiatal hernia    repaired 30 yrs ago in [place]   Hypertension    MHA (microangiopathic hemolytic  anemia) (HCC)     Past Surgical History:  Procedure Laterality Date   APPENDECTOMY N/A    BACK SURGERY     LAPAROSCOPIC CHOLECYSTECTOMY N/A 03/1992   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   [facility] TOTAL [facility]NEE Right [facility]/2021   [facility]   TOTAL ABDOMINAL HYSTERECTOMY Right    TUBAL LIGATION     WRIST ARTHROSCOPY Right 02/12/2018   Procedure: Right wrist arthroscopy, evaluation under anesthesia with debridement and repair as necessary and right carpal tunnel release;  Surgeon: Camella Fallow, MD;  Location: [facility] OR;  Service: Orthopedics;  Laterality: Right;  90 mins    Family History  Problem Relation Age of Onset   Throat cancer Father    Kidney failure Father    Asthma Other    Diabetes Other    Hyperlipidemia Other     Social History   Socioeconomic History   Marital status: Married    Spouse name: Not on file   Number of children: Not on file   Years of education: Not on file   Highest education level: Some college, no degree  Occupational History   Occupation: Administrator, 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[id number] [place]  Tobacco Use   Smoking status: Never   Smokeless tobacco: Never  Vaping Use   Vaping status: Never Used  Substance and Sexual Activity   Alcohol use: Not Currently    Comment: rare   Drug use: No   Sexual activity: Not on file  Other Topics Concern   Not on file  Social  History Narrative   Exercise-- no   Social Drivers of Corporate investment banker Strain: Low Risk  (08/27/2023)   Overall Financial Resource Strain (CARDIA)    Difficulty of Paying Living Expenses: Not very hard  Food Insecurity: No Food Insecurity (08/27/2023)   Hunger Vital Sign    Worried About Running Out of Food in the Last Year: Never true    Ran Out of Food in the Last Year: Never true  Transportation Needs: No Transportation Needs (08/27/2023)   PRAPARE - Administrator, Civil Service (Medical): No    Lack of Transportation (Non-Medical): No  Physical  Activity: Insufficiently Active (08/27/2023)   Exercise Vital Sign    Days of Exercise per Week: 1 day    Minutes of Exercise per Session: 10 min  Stress: Stress Concern Present (08/27/2023)   [employer] of Occupational Health - Occupational Stress Questionnaire    Feeling of Stress: To some extent  Social Connections: Socially Integrated (08/27/2023)   Social Connection and Isolation Panel    Frequency of Communication with Friends and Family: More than three times a week    Frequency of Social Gatherings with Friends and Family: Twice a week    Attends Religious Services: More than 4 times per year    Active Member of [employer] or Organizations: Yes    Attends Banker Meetings: Patient declined    Marital Status: Married  Catering manager Violence: Not At Risk (03/19/2022)   Received from [facility]   HITS    Over the last 12 months how often did your partner physically hurt you?: Never    Over the last 12 months how often did your partner insult you or talk down to you?: Never    Over the last 12 months how often did your partner threaten you with physical harm?: Never    Over the last 12 months how often did your partner scream or curse at you?: Never    Outpatient Medications Prior to Visit  Medication Sig Dispense Refill   albuterol  (PROVENTIL ) (2.5 MG/3ML) 0.083% nebulizer solution Take 3 mLs (2.5 mg total) by nebulization every 6 (six) hours as needed for wheezing or shortness of breath. 150 mL 1   albuterol  (VENTOLIN  HFA) 108 (90 Base) MCG/ACT inhaler Inhale 2 puffs into the lungs every 6 (six) hours as needed for wheezing or shortness of breath (Cough). 18 g 3   azelastine  (ASTELIN ) 0.1 % nasal spray Place 1 spray into both nostrils 2 (two) times daily. Use in each nostril as directed 30 mL 12   benzonatate  (TESSALON ) 100 MG capsule Take 1-2 capsules (100-200 mg total) by mouth 3 (three) times daily as needed. 30 capsule 0   clotrimazole -betamethasone  (LOTRISONE )  cream Apply 1 Application topically daily. 30 g 0   diclofenac  (VOLTAREN ) 75 MG EC tablet Take 1 tablet (75 mg total) by mouth 2 (two) times daily. 60 tablet 1   fluconazole  (DIFLUCAN ) 150 MG tablet Take 1 tablet (150 mg) now.  Repeat in 5 to 7 days. 2 tablet 0   fluticasone  (FLONASE ) 50 MCG/ACT nasal spray Place 2 sprays into both nostrils daily. 16 g 0   furosemide  (LASIX ) 40 MG tablet Take 1 tablet (40 mg total) by mouth daily. 30 tablet 3   HYDROcodone -acetaminophen  (NORCO/VICODIN) 5-325 MG tablet Take 1 tablet by mouth every 6 (six) hours as needed for moderate pain (pain score 4-6). 20 tablet 0   ipratropium (ATROVENT ) 0.03 % nasal spray Place  2 sprays into both nostrils every 12 (twelve) hours. 30 mL 0   Meclizine  HCl (TRAVEL SICKNESS) 25 MG CHEW CHEW 1 TABLET EVERY 6 HOURS AS NEEDED 30 tablet 1   methocarbamol  (ROBAXIN -750) 750 MG tablet Take 1 tablet (750 mg total) by mouth every 6 (six) hours as needed for muscle spasms.     montelukast  (SINGULAIR ) 10 MG tablet Take 1 tablet (10 mg total) by mouth at bedtime. 90 tablet 3   nystatin  (MYCOSTATIN ) 100000 UNIT/ML suspension Take 5 mLs (500,000 Units total) by mouth 4 (four) times daily. 60 mL 0   pantoprazole  (PROTONIX ) 40 MG tablet Take 1 tablet (40 mg total) by mouth daily. 30 tablet 3   pregabalin  (LYRICA ) 300 MG capsule Take 1 capsule (300 mg total) by mouth 2 (two) times daily. 180 capsule 1   SUMAtriptan  (IMITREX ) 100 MG tablet TAKE 1 TABLET BY MOUTH AS NEEDED FOR MIGRAINE. REPEAT IN 2 HOURS IF NEEDED 12 tablet 3   topiramate  (TOPAMAX ) 50 MG tablet Take 1 tablet (50 mg total) by mouth 2 (two) times daily. 180 tablet 1   traMADol  (ULTRAM ) 50 MG tablet Take 1 tablet (50 mg total) by mouth every 8 (eight) hours as needed. 30 tablet 0   ALPRAZolam  (XANAX ) 0.5 MG tablet TAKE 1 TABLET BY MOUTH THREE TIMES PER DAY AS NEEDED 90 tablet 1   escitalopram  (LEXAPRO ) 10 MG tablet Take 1 tablet (10 mg total) by mouth daily. 30 tablet 2   predniSONE   (DELTASONE ) 20 MG tablet Take 2 tablets (40 mg total) by mouth daily with breakfast. 14 tablet 0   No facility-administered medications prior to visit.    Allergies  Allergen Reactions   Fish Allergy Hives and Swelling   Iohexol  Other (See Comments)     Desc: hives, sob, pt. needs 13 hr prep   01/16/05    Ivp Dye [Iodinated Contrast Media] Hives and Swelling   Penicillins Anaphylaxis, Hives and Other (See Comments)    Has patient had a PCN reaction causing immediate rash, facial/tongue/throat swelling, SOB or lightheadedness with hypotension: Unknown Has patient had a PCN reaction causing severe rash involving mucus membranes or skin necrosis: No Has patient had a PCN reaction that required hospitalization: Yes Has patient had a PCN reaction occurring within the last 10 years: No If all of the above answers are NO, then may proceed with Cephalosporin use.    Tizanidine Hcl Nausea And Vomiting, Rash and Shortness Of Breath   Morphine  And Codeine  Nausea And Vomiting    Review of Systems  Constitutional:  Negative for fever and malaise/fatigue.  HENT:  Negative for congestion.   Eyes:  Negative for blurred vision.  Respiratory:  Negative for shortness of breath.   Cardiovascular:  Negative for chest pain, palpitations and leg swelling.  Gastrointestinal:  Negative for abdominal pain, blood in stool and nausea.  Genitourinary:  Negative for dysuria and frequency.  Musculoskeletal:  Negative for falls.  Skin:  Negative for rash.  Neurological:  Negative for dizziness, loss of consciousness and headaches.  Endo/Heme/Allergies:  Negative for environmental allergies.  Psychiatric/Behavioral:  Negative for depression. The patient is not nervous/anxious.        Objective:    Physical Exam Vitals and nursing note reviewed.  Constitutional:      General: She is not in acute distress.    Appearance: Normal appearance. She is well-developed.  HENT:     Head: Normocephalic and  atraumatic.  Eyes:     General: No  scleral icterus.       Right eye: No discharge.        Left eye: No discharge.  Cardiovascular:     Rate and Rhythm: Normal rate and regular rhythm.     Heart sounds: No murmur heard. Pulmonary:     Effort: Pulmonary effort is normal. No respiratory distress.     Breath sounds: Normal breath sounds.  Musculoskeletal:        General: Normal range of motion.     Cervical back: Normal range of motion and neck supple.     Right lower leg: No edema.     Left lower leg: No edema.  Skin:    General: Skin is warm and dry.  Neurological:     Mental Status: She is alert and oriented to person, place, and time.  Psychiatric:        Mood and Affect: Mood is anxious and depressed.        Behavior: Behavior normal.        Thought Content: Thought content normal.        Judgment: Judgment normal.     BP 112/78 (BP Location: Left Arm, Patient Position: Sitting, Cuff Size: Large)   Pulse 91   Temp 98.2 F (36.8 C) (Oral)   Resp 20   Ht 4' 11 (1.499 m)   Wt 226 lb 3.2 oz (102.6 kg)   SpO2 95%   BMI 45.69 kg/m  Wt Readings from Last 3 Encounters:  11/09/23 226 lb 3.2 oz (102.6 kg)  09/29/23 233 lb (105.7 kg)  08/28/23 233 lb 6.4 oz (105.9 kg)    Diabetic Foot Exam - Simple   No data filed    Lab Results  Component Value Date   WBC 9.8 08/28/2023   HGB 14.4 08/28/2023   HCT 45.0 08/28/2023   PLT 280 08/28/2023   GLUCOSE 94 08/28/2023   CHOL 150 05/08/2023   TRIG 189.0 (H) 05/08/2023   HDL 32.20 (L) 05/08/2023   LDLDIRECT 100.0 07/21/2022   LDLCALC 80 05/08/2023   ALT 23 08/28/2023   AST 21 08/28/2023   NA 143 08/28/2023   K 4.7 08/28/2023   CL 106 08/28/2023   CREATININE 0.80 08/28/2023   BUN 14 08/28/2023   CO2 28 08/28/2023   TSH 0.85 05/08/2023   INR 1.1 (H) 04/09/2023   HGBA1C 6.6 (H) 07/28/2022    Lab Results  Component Value Date   TSH 0.85 05/08/2023   Lab Results  Component Value Date   WBC 9.8 08/28/2023   HGB  14.4 08/28/2023   HCT 45.0 08/28/2023   MCV 89.5 08/28/2023   PLT 280 08/28/2023   Lab Results  Component Value Date   NA 143 08/28/2023   K 4.7 08/28/2023   CO2 28 08/28/2023   GLUCOSE 94 08/28/2023   BUN 14 08/28/2023   CREATININE 0.80 08/28/2023   BILITOT 0.3 08/28/2023   ALKPHOS 86 05/08/2023   AST 21 08/28/2023   ALT 23 08/28/2023   PROT 6.9 08/28/2023   ALBUMIN 4.2 05/08/2023   CALCIUM 9.1 08/28/2023   ANIONGAP 11 08/18/2023   EGFR 85 08/28/2023   GFR 80.97 05/08/2023   Lab Results  Component Value Date   CHOL 150 05/08/2023   Lab Results  Component Value Date   HDL 32.20 (L) 05/08/2023   Lab Results  Component Value Date   LDLCALC 80 05/08/2023   Lab Results  Component Value Date   TRIG 189.0 (H) 05/08/2023  Lab Results  Component Value Date   CHOLHDL 5 05/08/2023   Lab Results  Component Value Date   HGBA1C 6.6 (H) 07/28/2022       Assessment & Plan:  Depression with anxiety Assessment & Plan: Inc lexapro  20 mg  Con't as needed xanax   F/u 1 month  Orders: -     Escitalopram  Oxalate; Take 1 tablet (20 mg total) by mouth daily.  Dispense: 90 tablet; Refill: 3  Iron deficiency anemia, unspecified iron deficiency anemia type -     CBC with Differential/Platelet -     VITAMIN D  25 Hydroxy (Vit-D Deficiency, Fractures) -     Insulin , random  Primary hypertension Assessment & Plan: Well controlled, no changes to meds. Encouraged heart healthy diet such as the DASH diet and exercise as tolerated.     Morbid obesity (HCC) -     CBC with Differential/Platelet -     Comprehensive metabolic panel with GFR -     Lipid panel -     TSH -     Hemoglobin A1c -     Vitamin B12 -     VITAMIN D  25 Hydroxy (Vit-D Deficiency, Fractures) -     Insulin , random  Generalized anxiety disorder -     ALPRAZolam ; TAKE 1 TABLET BY MOUTH THREE TIMES PER DAY AS NEEDED  Dispense: 90 tablet; Refill: 1  Assessment and Plan Assessment & Plan Depression,  unspecified   Depression persists without improvement. She is currently on Lexapro  10 mg, having switched from Prozac  due to hand tremors. Financial constraints prevent counseling. She reports some interest in activities, occasional low mood, and sleep disturbances, but no suicidal ideation. Unemployment complicates management. Increase Lexapro  to 20 mg.  Obesity, unspecified   Obesity management continues with participation in a weight management program since September. Her weight has reduced from 236 lbs to 225 lbs. She follows a diet plan focusing on protein and vegetables and engages in regular exercise. Physical function and mood have improved with weight loss. Her mother-in-law provides financial support for the program. Continue current weight management program.    Jamee JONELLE Antonio Cyndee, DO

## 2023-11-09 NOTE — Assessment & Plan Note (Signed)
 Inc lexapro  20 mg  Con't as needed xanax   F/u 1 month

## 2023-11-09 NOTE — Assessment & Plan Note (Signed)
 Well controlled, no changes to meds. Encouraged heart healthy diet such as the DASH diet and exercise as tolerated.

## 2023-11-10 LAB — INSULIN, RANDOM: Insulin: 11.1 u[IU]/mL

## 2023-11-12 ENCOUNTER — Encounter: Payer: Self-pay | Admitting: Family Medicine

## 2023-11-15 ENCOUNTER — Ambulatory Visit (INDEPENDENT_AMBULATORY_CARE_PROVIDER_SITE_OTHER): Payer: PRIVATE HEALTH INSURANCE | Admitting: Family Medicine

## 2023-11-15 ENCOUNTER — Other Ambulatory Visit: Payer: Self-pay | Admitting: Family Medicine

## 2023-11-15 DIAGNOSIS — E559 Vitamin D deficiency, unspecified: Secondary | ICD-10-CM

## 2023-11-15 DIAGNOSIS — I10 Essential (primary) hypertension: Secondary | ICD-10-CM

## 2023-11-17 MED ORDER — VITAMIN D (ERGOCALCIFEROL) 1.25 MG (50000 UNIT) PO CAPS: 50000.0000 [IU] | ORAL_CAPSULE | ORAL | 12 refills | Status: AC

## 2023-12-07 ENCOUNTER — Other Ambulatory Visit: Payer: Self-pay | Admitting: Family Medicine

## 2023-12-07 DIAGNOSIS — M170 Bilateral primary osteoarthritis of knee: Secondary | ICD-10-CM

## 2023-12-15 ENCOUNTER — Other Ambulatory Visit: Payer: Self-pay | Admitting: Family Medicine

## 2023-12-15 DIAGNOSIS — Z8669 Personal history of other diseases of the nervous system and sense organs: Secondary | ICD-10-CM

## 2023-12-30 ENCOUNTER — Encounter: Payer: Self-pay | Admitting: Internal Medicine

## 2023-12-30 ENCOUNTER — Ambulatory Visit: Payer: PRIVATE HEALTH INSURANCE | Admitting: Internal Medicine

## 2023-12-30 VITALS — BP 126/72 | HR 68 | Ht 59.0 in | Wt 226.0 lb

## 2023-12-30 DIAGNOSIS — Z860102 Personal history of hyperplastic colon polyps: Secondary | ICD-10-CM

## 2023-12-30 DIAGNOSIS — K760 Fatty (change of) liver, not elsewhere classified: Secondary | ICD-10-CM

## 2023-12-30 DIAGNOSIS — K219 Gastro-esophageal reflux disease without esophagitis: Secondary | ICD-10-CM | POA: Diagnosis not present

## 2023-12-30 DIAGNOSIS — R1319 Other dysphagia: Secondary | ICD-10-CM | POA: Diagnosis not present

## 2023-12-30 DIAGNOSIS — R160 Hepatomegaly, not elsewhere classified: Secondary | ICD-10-CM

## 2023-12-30 DIAGNOSIS — R1011 Right upper quadrant pain: Secondary | ICD-10-CM

## 2023-12-30 DIAGNOSIS — E669 Obesity, unspecified: Secondary | ICD-10-CM

## 2023-12-30 NOTE — Progress Notes (Signed)
 Subjective:    Patient ID: Deborah Watts, female    DOB: Dec 03, 1965, 58 y.o.   MRN: [id number]  HPI Deborah Watts is a 58 year old female who presents for follow-up after upper endoscopy and colonoscopy.  She underwent an upper endoscopy and colonoscopy on September 29, 2023, due to dysphagia, hem positive stool, prior hiatal hernia repair, and borderline iron deficiency without anemia. The esophagus appeared normal but was dilated to 18 mm with a balloon. Biopsies ruled out eosinophilic esophagitis but showed mild vascular congestion and focal squamous ballooning. The stomach and duodenum appeared normal, with gastric biopsies showing mild nonspecific reactive gastropathy and no H. pylori infection. Duodenal biopsies were normal.  Her pantoprazole  was increased from 20 to 40 mg after EGD biopsy results.  The colonoscopy revealed two 3-4 mm hyperplastic polyps in the rectum, which were removed, and a few diverticula in the sigmoid and descending colon.  She reports improvement in swallowing following esophageal dilation. She is currently taking pantoprazole  40 mg daily, which was increased from 20 mg. She experiences right upper quadrant pain, which worsens after eating. No regular alcohol consumption except for a rare occasion.  She is on a weight loss program and has lost 14 pounds, reducing her weight from 236 to 222.6 pounds. She follows a diet plan from [facility], which includes eating proteins and vegetables. She takes Lithotopic drops as part of the program but did not bring them to the visit.  She has a history of iron deficiency and takes iron supplements three times a week, but recent labs show normal iron levels, and she is not anemic. She also has a vitamin D  deficiency and takes a 50,000-unit vitamin D3 supplement bi-weekly. She reports bowel irregularities, with soft or loose stools and occasional constipation, for which she takes Miralax tablets as needed.  She  experiences pain in the right upper quadrant and left lower quadrant, particularly after eating. She describes her bowel movements as 'weird' and reports that iron tablets bother her stomach. She drinks about 11 glasses of water daily and experiences frequent urination.   Review of Systems As per HPI, otherwise negative  Current Medications, Allergies, Past Medical History, Past Surgical History, Family History and Social History were reviewed in [employer] record.    Objective:   Physical Exam BP 126/72   Pulse 68   Ht 4' 11 (1.499 m)   Wt 226 lb (102.5 kg)   BMI 45.65 kg/m  Gen: awake, alert, NAD HEENT: anicteric  Ext: no c/c/e Neuro: nonfocal  LABS B12: Normal (11/09/2023) CMP: AST 22, ALT 25, total bilirubin 0.4, alkaline phosphatase 101, albumin 4.1 (11/09/2023) CBC: WBC 9.0, Hb 13.9, MCV 87.6, PLT 235 (11/09/2023) Iron studies: Iron 53, TIBC slightly low 235, percent saturation 23, ferritin 78 (08/28/2023)  RADIOLOGY MR pelvis: Small left ovarian cysts, prior hysterectomy and salpingo-oophorectomy (03/25/2023) CT abdomen and pelvis with contrast: No acute findings, stable hepatomegaly, diffuse hepatic steatosis (03/13/2023)  DIAGNOSTIC EGD: Normal esophagus, dilated to 18mm, mild vascular congestion, focal squamous ballooning, reflux esophagitis, normal stomach and duodenum, mild nonspecific reactive gastropathy, no H. 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 Weight loss expected to reduce hepatic fat and  symptoms. Discussed GLP-1 agonists for reducing liver fat and aiding weight loss. - Order FibroScan to assess liver fibrosis. -  Consider GLP-1 agonist therapy based on FibroScan results.  Gastroesophageal reflux disease with esophagitis, status post dilation for dysphagia Reflux esophagitis confirmed by biopsy. Esophageal dilation improved dysphagia. - Continue pantoprazole  40 mg daily.  Obesity with ongoing weight management Ongoing weight management with structured program. Current weight loss of 2 pounds. Discussed GLP-1 agonists for weight loss and liver health. - Consider GLP-1 agonist therapy based on FibroScan results.  Vitamin D  deficiency Vitamin D  deficiency requiring supplementation. - Continue vitamin D3 supplementation (50,000 units biweekly).  Borderline iron studies Her ferritin was never really low and she did not have convincing iron deficiency.  She is only on iron 3 days a week now.  Given current iron studies I recommended we stop -Stop oral iron -Repeat IBC plus ferritin at follow-up  30 minutes total spent today including patient facing time, coordination of care, reviewing medical history/procedures/pertinent radiology studies, and documentation of the encounter.

## 2023-12-30 NOTE — Patient Instructions (Addendum)
 We will contact you when our Fibroscan becomes available. We will also contact you when the January schedule becomes available to schedule to a follow-up appointment.   We will re-check your iron at your appointment in 3 months.  Stop your iron.  Continue pantoprazole  40 mg daily.   _______________________________________________________  If your blood pressure at your visit was 140/90 or greater, please contact your primary care physician to follow up on this.  _______________________________________________________  If you are age 59 or older, your body mass index should be between 23-30. Your Body mass index is 45.65 kg/m. If this is out of the aforementioned range listed, please consider follow up with your Primary Care Provider.  If you are age 93 or younger, your body mass index should be between 19-25. Your Body mass index is 45.65 kg/m. If this is out of the aformentioned range listed, please consider follow up with your Primary Care Provider.   ________________________________________________________  The [facility] providers would like to encourage you to use MYCHART to communicate with providers for non-urgent requests or questions.  Due to long hold times on the telephone, sending your provider a message by [facility] may be a faster and more efficient way to get a response.  Please allow 48 business hours for a response.  Please remember that this is for non-urgent requests.  _______________________________________________________  [facility] is using a team-based approach to care.  Your team is made up of your doctor and two to three APPS. Our APPS (Nurse Practitioners and Physician Assistants) work with your physician to ensure care continuity for you. They are fully qualified to address your health concerns and develop a treatment plan. They communicate directly with your gastroenterologist to care for you. Seeing the Advanced Practice Practitioners on your  physician's team can help you by facilitating care more promptly, often allowing for earlier appointments, access to diagnostic testing, procedures, and other specialty referrals.

## 2024-01-01 ENCOUNTER — Other Ambulatory Visit: Payer: Self-pay | Admitting: Family Medicine

## 2024-01-01 ENCOUNTER — Encounter: Payer: Self-pay | Admitting: Family Medicine

## 2024-01-01 DIAGNOSIS — H8110 Benign paroxysmal vertigo, unspecified ear: Secondary | ICD-10-CM

## 2024-01-01 NOTE — Telephone Encounter (Signed)
 Not refilled since 2024. Please advise

## 2024-01-13 ENCOUNTER — Ambulatory Visit (INDEPENDENT_AMBULATORY_CARE_PROVIDER_SITE_OTHER)
Admit: 2024-01-13 | Discharge: 2024-01-13 | Disposition: A | Discharge status: Home / Self Care | Payer: PRIVATE HEALTH INSURANCE | Admitting: Radiology

## 2024-01-13 ENCOUNTER — Encounter (HOSPITAL_BASED_OUTPATIENT_CLINIC_OR_DEPARTMENT_OTHER): Payer: Self-pay

## 2024-01-13 ENCOUNTER — Other Ambulatory Visit (HOSPITAL_BASED_OUTPATIENT_CLINIC_OR_DEPARTMENT_OTHER): Payer: Self-pay

## 2024-01-13 ENCOUNTER — Ambulatory Visit (HOSPITAL_BASED_OUTPATIENT_CLINIC_OR_DEPARTMENT_OTHER)
Admission: RE | Admit: 2024-01-13 | Discharge: 2024-01-13 | Disposition: A | Discharge status: Home / Self Care | Payer: PRIVATE HEALTH INSURANCE | Attending: Family Medicine | Admitting: Family Medicine

## 2024-01-13 VITALS — BP 127/86 | HR 63 | Temp 97.9°F | Resp 20

## 2024-01-13 DIAGNOSIS — R051 Acute cough: Secondary | ICD-10-CM | POA: Diagnosis not present

## 2024-01-13 LAB — POC COVID19/FLU A&B COMBO
Covid Antigen, POC: NEGATIVE
Influenza A Antigen, POC: NEGATIVE
Influenza B Antigen, POC: NEGATIVE

## 2024-01-13 MED ORDER — AZITHROMYCIN 250 MG PO TABS
ORAL_TABLET | ORAL | 0 refills | Status: AC
  Filled 2024-01-13: qty 6, 5d supply, fill #0

## 2024-01-13 MED ORDER — ONDANSETRON 4 MG PO TBDP
4.0000 mg | ORAL_TABLET | Freq: Three times a day (TID) | ORAL | 0 refills | Status: DC | PRN
  Filled 2024-01-13: qty 20, 7d supply, fill #0

## 2024-01-13 MED ORDER — PROMETHAZINE-DM 6.25-15 MG/5ML PO SYRP
5.0000 mL | ORAL_SOLUTION | Freq: Four times a day (QID) | ORAL | 0 refills | Status: DC | PRN
  Filled 2024-01-13: qty 118, 6d supply, fill #0

## 2024-01-13 MED ORDER — PREDNISONE 20 MG PO TABS
40.0000 mg | ORAL_TABLET | Freq: Every day | ORAL | 0 refills | Status: AC
  Filled 2024-01-13: qty 10, 5d supply, fill #0

## 2024-01-13 NOTE — ED Provider Notes (Signed)
 Deborah Watts    CSN: [id number] Arrival date & time: 01/13/24  0809      History   Chief Complaint Chief Complaint  Patient presents with   Cough    Ear fullness, headache and chest congestion - Entered by patient    HPI Deborah Watts is a 58 y.o. female.   Pt c/o cough-productive, HA, nasal congestion, facial pain, left eye watering, nausea, and bilateral ear pain for the last 3-4 days. Denies chills, body aches, fever, and sore throat. Pt has taken tylenol  and mucinex  with no relief.     Cough   Past Medical History:  Diagnosis Date   Anxiety    Arthritis    Asthma    Headache    migraines   History of hiatal hernia    repaired 30 yrs ago in [place]   Hypertension    MHA (microangiopathic hemolytic anemia) (HCC)    Obesity     Patient Active Problem List   Diagnosis Date Noted   ANA positive 04/23/2023   Right upper quadrant abdominal pain 03/12/2023   Complex small left ovarian cyst on ultrasound 03/12/2023   Left lower quadrant abdominal pain 03/02/2023   Urinary frequency 03/02/2023   Iron deficiency anemia 03/02/2023   Pulmonary nodules 02/20/2023   Suspected sleep apnea 02/20/2023   Allergic rhinitis 02/20/2023   Pain of left calf 08/12/2022   Cellulitis of left lower extremity 08/12/2022   Edema, lower extremity 07/21/2022   SOB (shortness of breath) 07/21/2022   Hyperlipidemia 07/21/2022   Major depressive disorder, recurrent episode, moderate (HCC) 11/25/2021   Generalized anxiety disorder 11/25/2021   Depression with anxiety 10/28/2021   Asthma, chronic, unspecified asthma severity, uncomplicated 05/02/2021   Loud snoring 11/28/2019   Daytime sleepiness 11/28/2019   Other fatigue 11/28/2019   Primary osteoarthritis of left knee 08/02/2019   Rib pain 10/19/2018   Right knee pain 04/12/2017   Preventative health Watts 03/25/2016   Left ankle pain 01/14/2016   Left leg pain 04/28/2014   Pain of right calf 04/20/2014   Obesity  (BMI 30-39.9) 01/06/2013   Thrush, oral 03/04/2011   TINEA CRURIS 02/05/2010   HYPOKALEMIA 04/26/2009   Primary hypertension 02/08/2008   LOW BACK PAIN SYNDROME 05/26/2007   LEG CRAMPS 05/14/2007   Edema 05/14/2007   Chronic migraine without aura, with status migrainosus 05/01/2007   ASTHMATIC BRONCHITIS, ACUTE 10/15/2006   Anxiety state 04/20/2006   RESTLESS LEG SYNDROME, HX OF 04/20/2006    Past Surgical History:  Procedure Laterality Date   APPENDECTOMY N/A    BACK SURGERY     LAPAROSCOPIC CHOLECYSTECTOMY N/A 03/1992   OOPHORECTOMY Right    and also removed right fallopian tube, done abominally   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   [facility]   REPLACEMENT TOTAL KNEE Right 01/15/2021   [facility]   TUBAL LIGATION     VAGINAL HYSTERECTOMY     partial   WRIST ARTHROSCOPY Right 02/12/2018   Procedure: Right wrist arthroscopy, evaluation under anesthesia with debridement and repair as necessary and right carpal tunnel release;  Surgeon: Camella Fallow, MD;  Location: [facility] OR;  Service: Orthopedics;  Laterality: Right;  90 mins    OB History     Gravida  3   Para  2   Term  2   Preterm      AB  1   Living  2      SAB      IAB  1  Ectopic      Multiple      Live Births  2            Home Medications    Prior to Admission medications   Medication Sig Start Date End Date Taking? Authorizing Provider  azithromycin  (ZITHROMAX ) 250 MG tablet Take 2 tablets (500 mg total) by mouth daily for 1 day, THEN 1 tablet (250 mg total) daily for 4 days. 01/13/24 01/18/24 Yes Myrikal Messmer A, FNP  ondansetron  (ZOFRAN -ODT) 4 MG disintegrating tablet Take 1 tablet (4 mg total) by mouth every 8 (eight) hours as needed for nausea or vomiting. 01/13/24  Yes Loray Akard A, FNP  predniSONE  (DELTASONE ) 20 MG tablet Take 2 tablets (40 mg total) by mouth daily with breakfast for 5 days. 01/13/24 01/18/24 Yes Beadie Matsunaga A, FNP  promethazine -dextromethorphan (PROMETHAZINE -DM) 6.25-15  MG/5ML syrup Take 5 mLs by mouth 4 (four) times daily as needed for cough. 01/13/24  Yes Hashem Goynes A, FNP  albuterol  (PROVENTIL ) (2.5 MG/3ML) 0.083% nebulizer solution Take 3 mLs (2.5 mg total) by nebulization every 6 (six) hours as needed for wheezing or shortness of breath. 12/30/22   Antonio Cyndee Jamee JONELLE, DO  albuterol  (VENTOLIN  HFA) 108 (90 Base) MCG/ACT inhaler Inhale 2 puffs into the lungs every 6 (six) hours as needed for wheezing or shortness of breath (Cough). 05/08/23   Antonio Cyndee, Jamee R, DO  ALPRAZolam  (XANAX ) 0.5 MG tablet TAKE 1 TABLET BY MOUTH THREE TIMES PER DAY AS NEEDED 11/09/23   Lowne Chase, Yvonne R, DO  azelastine  (ASTELIN ) 0.1 % nasal spray Place 1 spray into both nostrils 2 (two) times daily. Use in each nostril as directed 05/08/23   Antonio Cyndee, Yvonne R, DO  clotrimazole -betamethasone  (LOTRISONE ) cream Apply 1 Application topically daily. 08/28/23   Lowne Chase, Yvonne R, DO  diclofenac  (VOLTAREN ) 75 MG EC tablet TAKE 1 TABLET BY MOUTH TWICE DAILY 12/07/23   Antonio Cyndee, Yvonne R, DO  escitalopram  (LEXAPRO ) 20 MG tablet Take 1 tablet (20 mg total) by mouth daily. 11/09/23   Antonio Cyndee Jamee JONELLE, DO  fluconazole  (DIFLUCAN ) 150 MG tablet Take 1 tablet (150 mg) now.  Repeat in 5 to 7 days. Patient taking differently: Take 150 mg by mouth. As needed 10/06/23   Ival Domino, FNP  fluticasone  (FLONASE ) 50 MCG/ACT nasal spray Place 2 sprays into both nostrils daily. 01/06/22   Moishe Chiquita HERO, NP  furosemide  (LASIX ) 40 MG tablet Take 1 tablet (40 mg total) by mouth daily. 05/08/23   Lowne Chase, Yvonne R, DO  HYDROcodone -acetaminophen  (NORCO/VICODIN) 5-325 MG tablet Take 1 tablet by mouth every 6 (six) hours as needed for moderate pain (pain score 4-6). 03/27/23   Lowne Chase, Yvonne R, DO  ipratropium (ATROVENT ) 0.03 % nasal spray Place 2 sprays into both nostrils every 12 (twelve) hours. 10/11/21   Vivienne Delon HERO, PA-C  Meclizine  HCl (TRAVEL SICKNESS) 25 MG CHEW CHEW 1 TABLET  EVERY 6 HOURS AS NEEDED 01/01/24   Antonio Cyndee, Yvonne R, DO  methocarbamol  (ROBAXIN -750) 750 MG tablet Take 1 tablet (750 mg total) by mouth every 6 (six) hours as needed for muscle spasms. 05/08/23   Antonio Cyndee Jamee JONELLE, DO  montelukast  (SINGULAIR ) 10 MG tablet Take 1 tablet (10 mg total) by mouth at bedtime. 05/08/23   Antonio Cyndee Jamee R, DO  pantoprazole  (PROTONIX ) 40 MG tablet Take 1 tablet (40 mg total) by mouth daily. 10/14/23   Pyrtle, Gordy HERO, MD  pregabalin  (LYRICA ) 300 MG capsule  Take 1 capsule (300 mg total) by mouth 2 (two) times daily. 05/08/23   Lowne Chase, Yvonne R, DO  SUMAtriptan  (IMITREX ) 100 MG tablet TAKE 1 TABLET BY MOUTH AS NEEDED FOR MIGRAINE. REPEAT IN 2 HOURS IF NEEDED 05/08/23   Antonio Meth, Yvonne R, DO  topiramate  (TOPAMAX ) 50 MG tablet Take 1 tablet (50 mg total) by mouth 2 (two) times daily. 12/15/23   Lowne Chase, Yvonne R, DO  traMADol  (ULTRAM ) 50 MG tablet Take 1 tablet (50 mg total) by mouth every 8 (eight) hours as needed. 06/16/23   Antonio Meth Jamee JONELLE, DO  Vitamin D , Ergocalciferol , (DRISDOL ) 1.25 MG (50000 UNIT) CAPS capsule Take 1 capsule (50,000 Units total) by mouth every 7 (seven) days. 11/17/23   Antonio Meth Jamee JONELLE, DO    Family History Family History  Problem Relation Age of Onset   Throat cancer Father    Kidney failure Father    Asthma Other    Diabetes Other    Hyperlipidemia Other    Colon cancer Neg Hx     Social History Social History   Tobacco Use   Smoking status: Never   Smokeless tobacco: Never  Vaping Use   Vaping status: Never Used  Substance Use Topics   Alcohol use: Not Currently    Comment: rare   Drug use: No     Allergies   Fish allergy, Iohexol , Ivp dye [iodinated contrast media], Penicillins, Tizanidine hcl, and Morphine  and codeine    Review of Systems Review of Systems  Respiratory:  Positive for cough.      Physical Exam Triage Vital Signs ED Triage Vitals  Encounter Vitals Group     BP 01/13/24 0822  127/86     Girls Systolic BP Percentile --      Girls Diastolic BP Percentile --      Boys Systolic BP Percentile --      Boys Diastolic BP Percentile --      Pulse Rate 01/13/24 0822 63     Resp 01/13/24 0822 20     Temp 01/13/24 0822 97.9 F (36.6 C)     Temp Source 01/13/24 0822 Oral     SpO2 01/13/24 0822 96 %     Weight --      Height --      Head Circumference --      Peak Flow --      Pain Score 01/13/24 0820 9     Pain Loc --      Pain Education --      Exclude from Growth Chart --    No data found.  Updated Vital Signs BP 127/86 (BP Location: Right Arm)   Pulse 63   Temp 97.9 F (36.6 C) (Oral)   Resp 20   SpO2 96%   Visual Acuity Right Eye Distance:   Left Eye Distance:   Bilateral Distance:    Right Eye Near:   Left Eye Near:    Bilateral Near:     Physical Exam Constitutional:      General: She is not in acute distress.    Appearance: Normal appearance. She is not ill-appearing, toxic-appearing or diaphoretic.  HENT:     Head: Normocephalic and atraumatic.     Right Ear: Tympanic membrane and ear canal normal.     Left Ear: Tympanic membrane and ear canal normal.     Nose: Congestion and rhinorrhea present.     Mouth/Throat:     Pharynx: Oropharynx is clear.  Eyes:     Conjunctiva/sclera: Conjunctivae normal.  Cardiovascular:     Rate and Rhythm: Normal rate and regular rhythm.     Pulses: Normal pulses.     Heart sounds: Normal heart sounds.  Pulmonary:     Effort: Pulmonary effort is normal.     Breath sounds: Examination of the right-upper field reveals wheezing. Examination of the right-middle field reveals wheezing. Examination of the right-lower field reveals wheezing. Wheezing present.  Skin:    General: Skin is warm and dry.  Neurological:     Mental Status: She is alert.  Psychiatric:        Mood and Affect: Mood normal.      UC Treatments / Results  Labs (all labs ordered are listed, but only abnormal results are  displayed) Labs Reviewed  POC COVID19/FLU A&B COMBO - Normal    EKG   Radiology DG Chest 2 View Result Date: 01/13/2024 EXAM: 2 VIEW(S) XRAY OF THE CHEST 01/13/2024 09:06:09 AM COMPARISON: 08/18/2023 CLINICAL HISTORY: Cough, chest congestion, headache, nasal congestion, facial pain, left eye watering, nausea, and bilateral ear pain. FINDINGS: LUNGS AND PLEURA: No focal pulmonary opacity. No pulmonary edema. No pleural effusion. No pneumothorax. HEART AND MEDIASTINUM: No acute abnormality of the cardiac and mediastinal silhouettes. BONES AND SOFT TISSUES: Mild multilevel degenerative changes of thoracic spine. IMPRESSION: 1. No acute cardiopulmonary process. 2. Mild multilevel degenerative changes of the thoracic spine. Electronically signed by: Ryan Salvage MD 01/13/2024 09:41 AM EDT RP Workstation: [id number]    Procedures Procedures (including critical Watts time)  Medications Ordered in UC Medications - No data to display  Initial Impression / Assessment and Plan / UC Course  I have reviewed the triage vital signs and the nursing notes.  Pertinent labs & imaging results that were available during my Watts of the patient were reviewed by me and considered in my medical decision making (see chart for details).     Acute cough-no concerns on x-ray today.  COVID and flu testing negative.  Treating for bronchitis at this time.  Medications as prescribed. Albuterol  as needed  Over-the-counter medications as needed.  Follow-up as needed Final Clinical Impressions(s) / UC Diagnoses   Final diagnoses:  Acute cough     Discharge Instructions      Treating you for bronchitis.  Take the medications as prescribed.  I am not seeing anything concerning on your x-ray but we will call with any changes if needed.  Use your albuterol  as needed.  Follow-up as needed     ED Prescriptions     Medication Sig Dispense Auth. Provider   azithromycin  (ZITHROMAX ) 250 MG tablet Take 2  tablets (500 mg total) by mouth daily for 1 day, THEN 1 tablet (250 mg total) daily for 4 days. 6 tablet Nikoleta Dady A, FNP   predniSONE  (DELTASONE ) 20 MG tablet Take 2 tablets (40 mg total) by mouth daily with breakfast for 5 days. 10 tablet Derian Dimalanta A, FNP   promethazine -dextromethorphan (PROMETHAZINE -DM) 6.25-15 MG/5ML syrup Take 5 mLs by mouth 4 (four) times daily as needed for cough. 118 mL Camira Geidel A, FNP   ondansetron  (ZOFRAN -ODT) 4 MG disintegrating tablet Take 1 tablet (4 mg total) by mouth every 8 (eight) hours as needed for nausea or vomiting. 20 tablet Adah Wilbert LABOR, FNP      PDMP not reviewed this encounter.   Adah Wilbert LABOR, FNP 01/13/24 [phone]

## 2024-01-13 NOTE — ED Triage Notes (Signed)
 Pt c/o cough-productive, HA, nasal congestion, facial pain, left eye watering, nausea, and bilateral ear pain for the last 3-4 days. Denies chills, body aches, fever, and sore throat. Pt has taken tylenol  and mucinex  with no relief.

## 2024-01-13 NOTE — Discharge Instructions (Signed)
 Treating you for bronchitis.  Take the medications as prescribed.  I am not seeing anything concerning on your x-ray but we will call with any changes if needed.  Use your albuterol  as needed.  Follow-up as needed

## 2024-01-18 IMAGING — US US EXTREM LOW VENOUS*R*
1 series · 14 of 24 positions shown · non-contrast
Comparison: None Available.

CLINICAL DATA: Right leg swelling.

EXAM:
RIGHT LOWER EXTREMITY VENOUS DOPPLER ULTRASOUND
TECHNIQUE: Gray-scale sonography with compression, as well as color and duplex
ultrasound, were performed to evaluate the deep venous system(s)
from the level of the common femoral vein through the popliteal and
proximal calf veins.

[Series 1: us extrem low venous*right* · 14 of 41 slices shown]
[im 1/41]
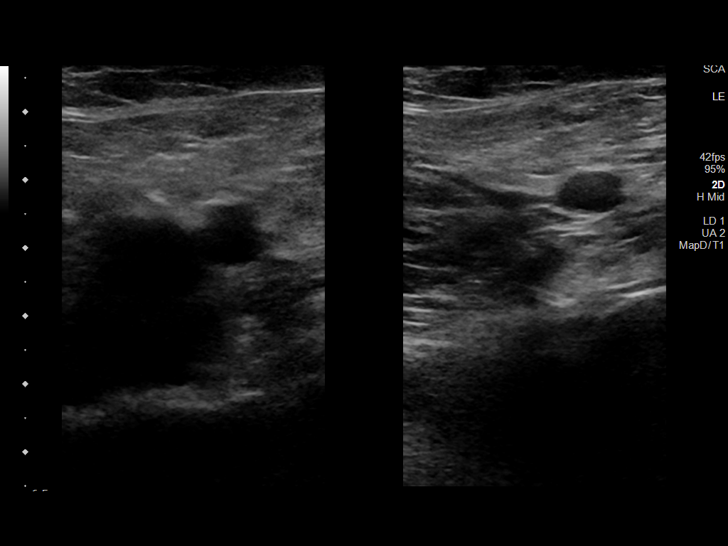
[im 4/41]
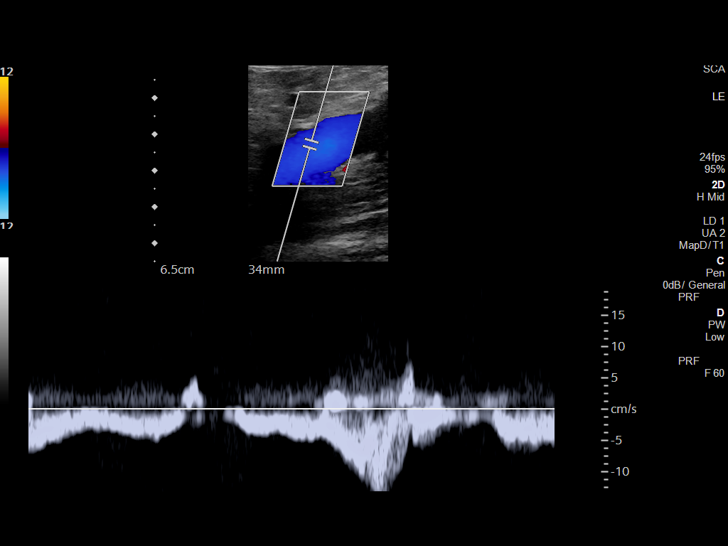
[im 7/41]
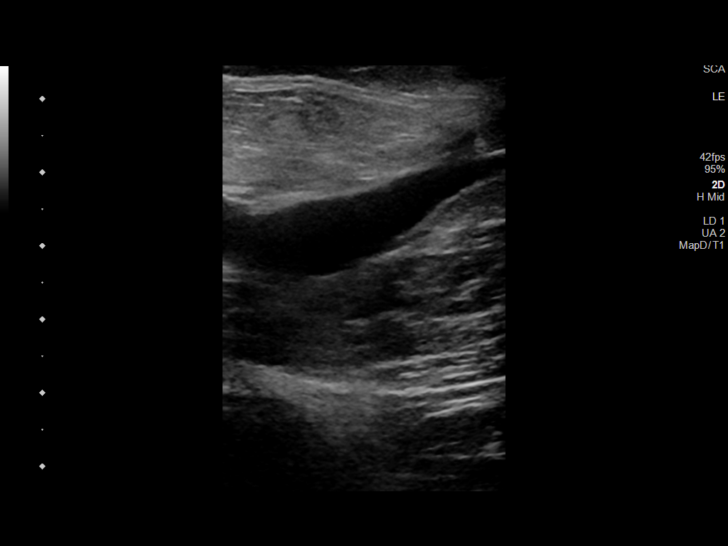
[im 11/41]
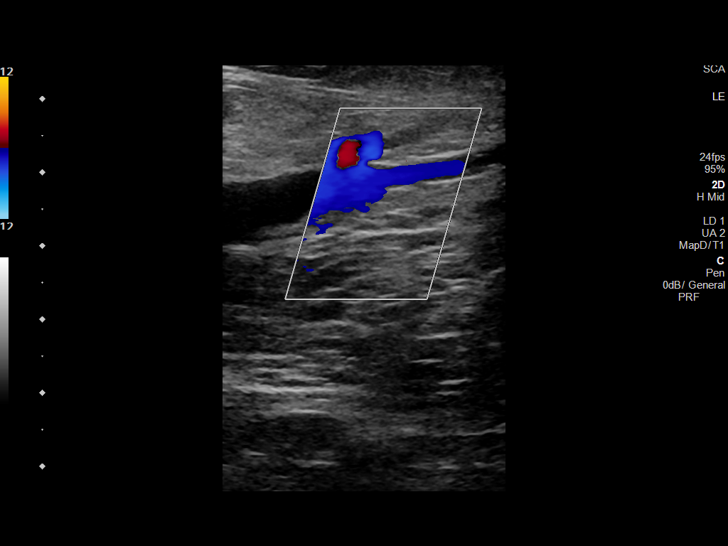
[im 13/41]
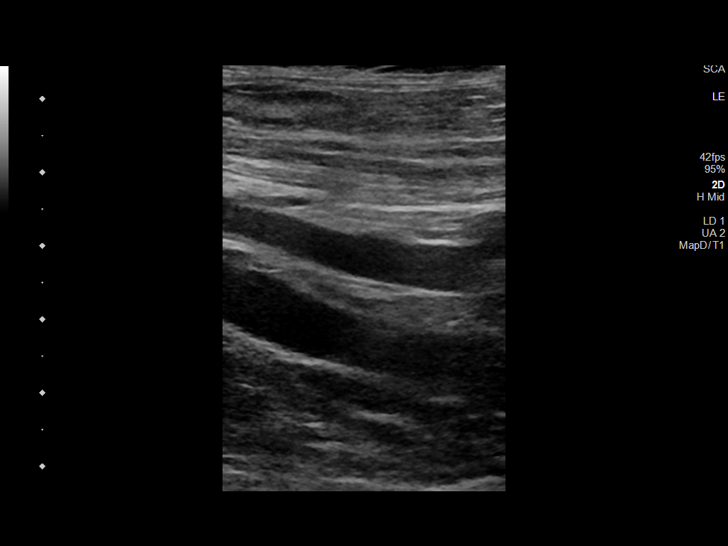
[im 16/41]
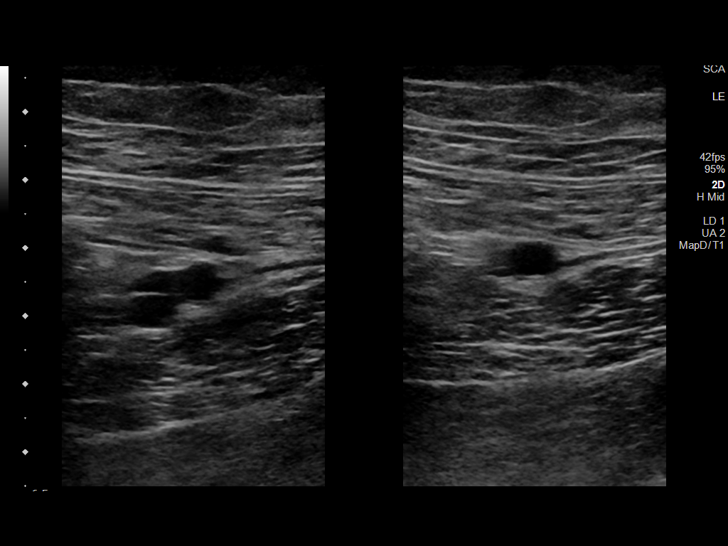
[im 20/41]
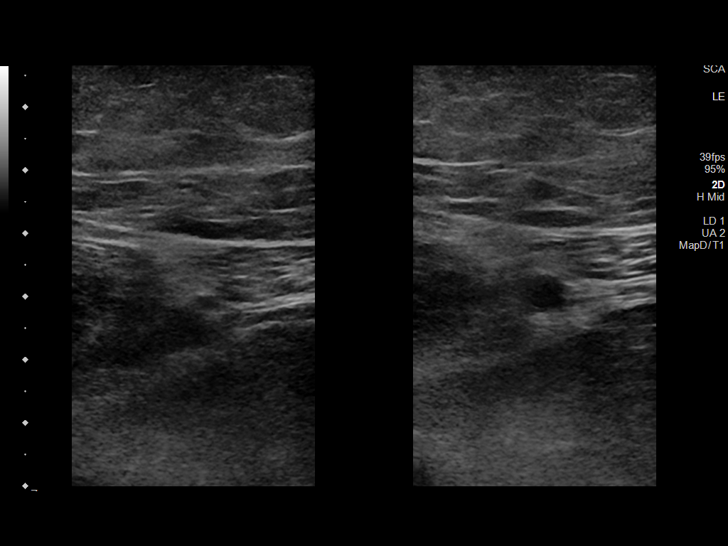
[im 21/41]
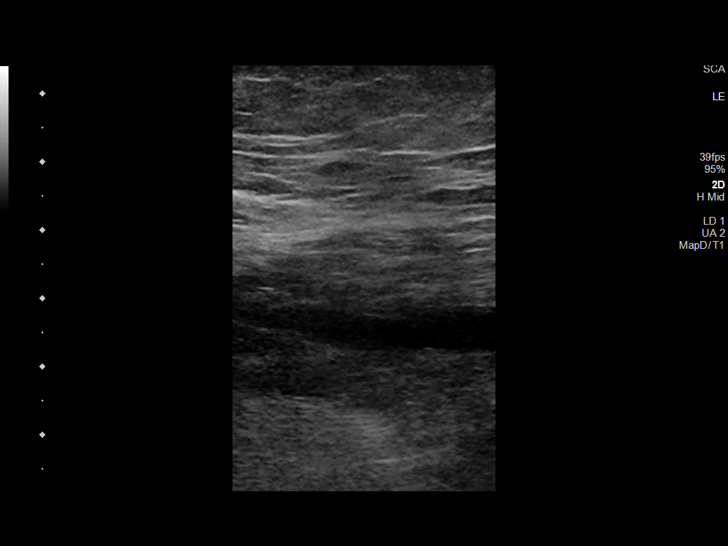
[im 25/41]
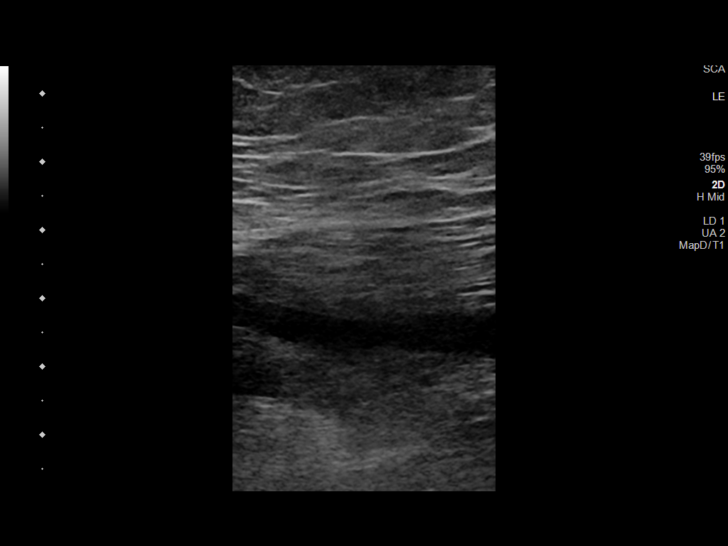
[im 28/41]
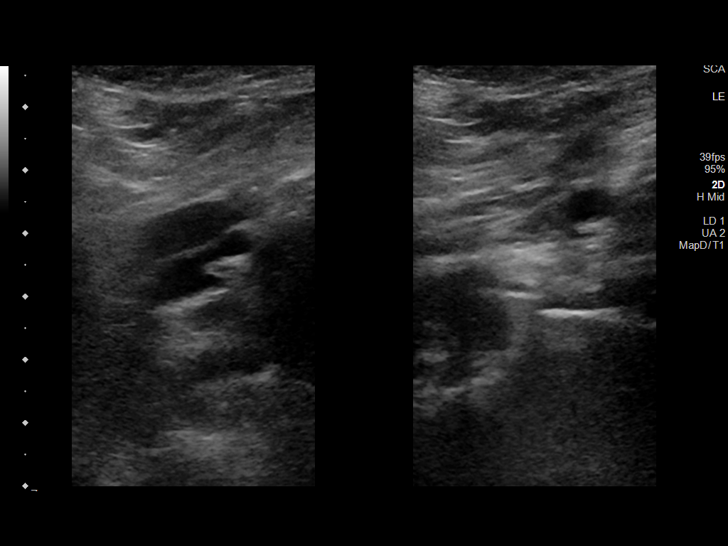
[im 32/41]
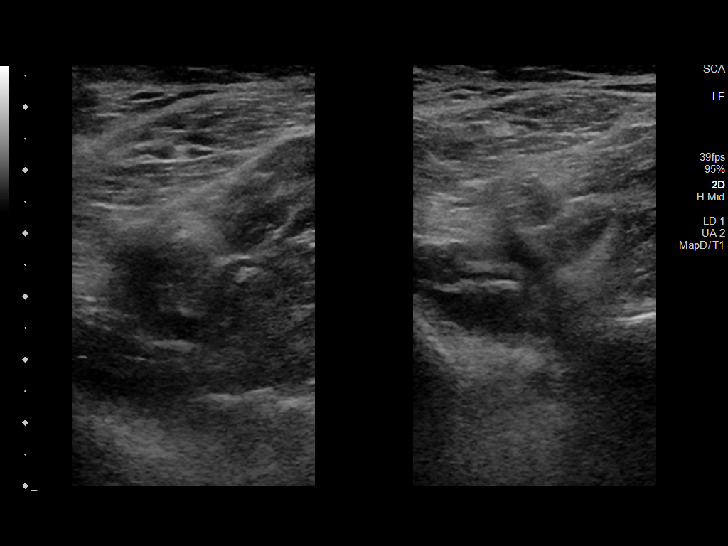
[im 34/41]
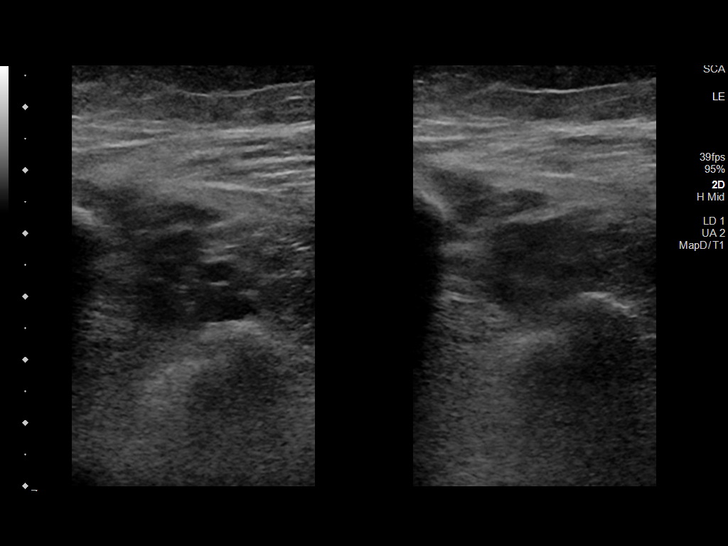
[im 37/41]
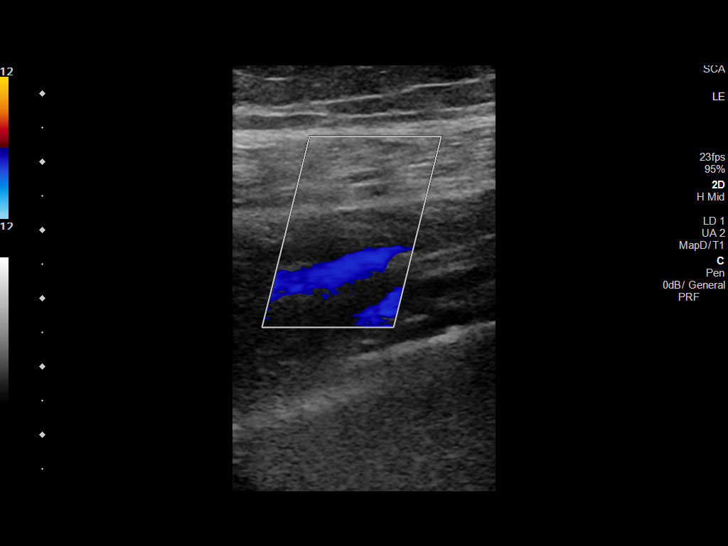
[im 41/41]
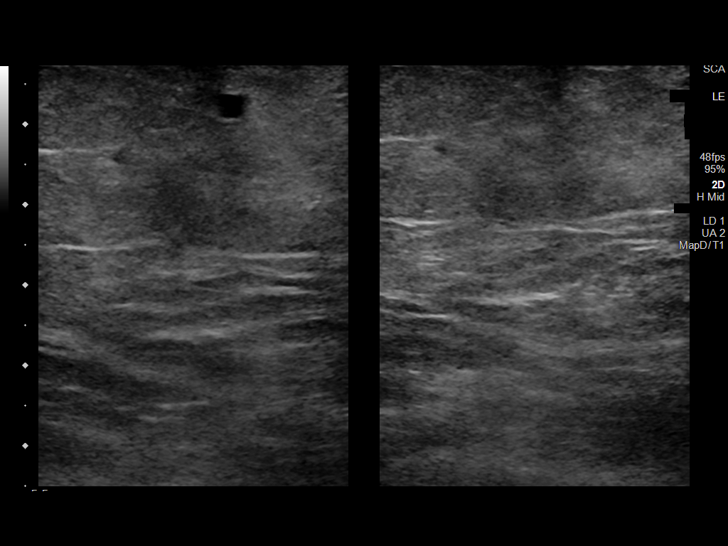

[14 of 24 positions shown; findings below may reference images not displayed]

FINDINGS: VENOUS

Normal compressibility of the common femoral, superficial femoral,
and popliteal veins, as well as the visualized calf veins.
Visualized portions of profunda femoral vein and great saphenous
vein unremarkable. No filling defects to suggest DVT on grayscale or
color Doppler imaging. Doppler waveforms show normal direction of
venous flow, normal respiratory plasticity and response to
augmentation.

Limited views of the contralateral common femoral vein are
unremarkable.

OTHER

None.

Limitations: none
IMPRESSION: Negative.

## 2024-01-18 IMAGING — CR DG CHEST 2V
2 series · 2 of 2 positions shown · non-contrast
Comparison: Prior chest radiographs 10/19/2018 and earlier.

CLINICAL DATA: Provided history: Shortness of breath.

EXAM:
CHEST - 2 VIEW

[w chest pa]
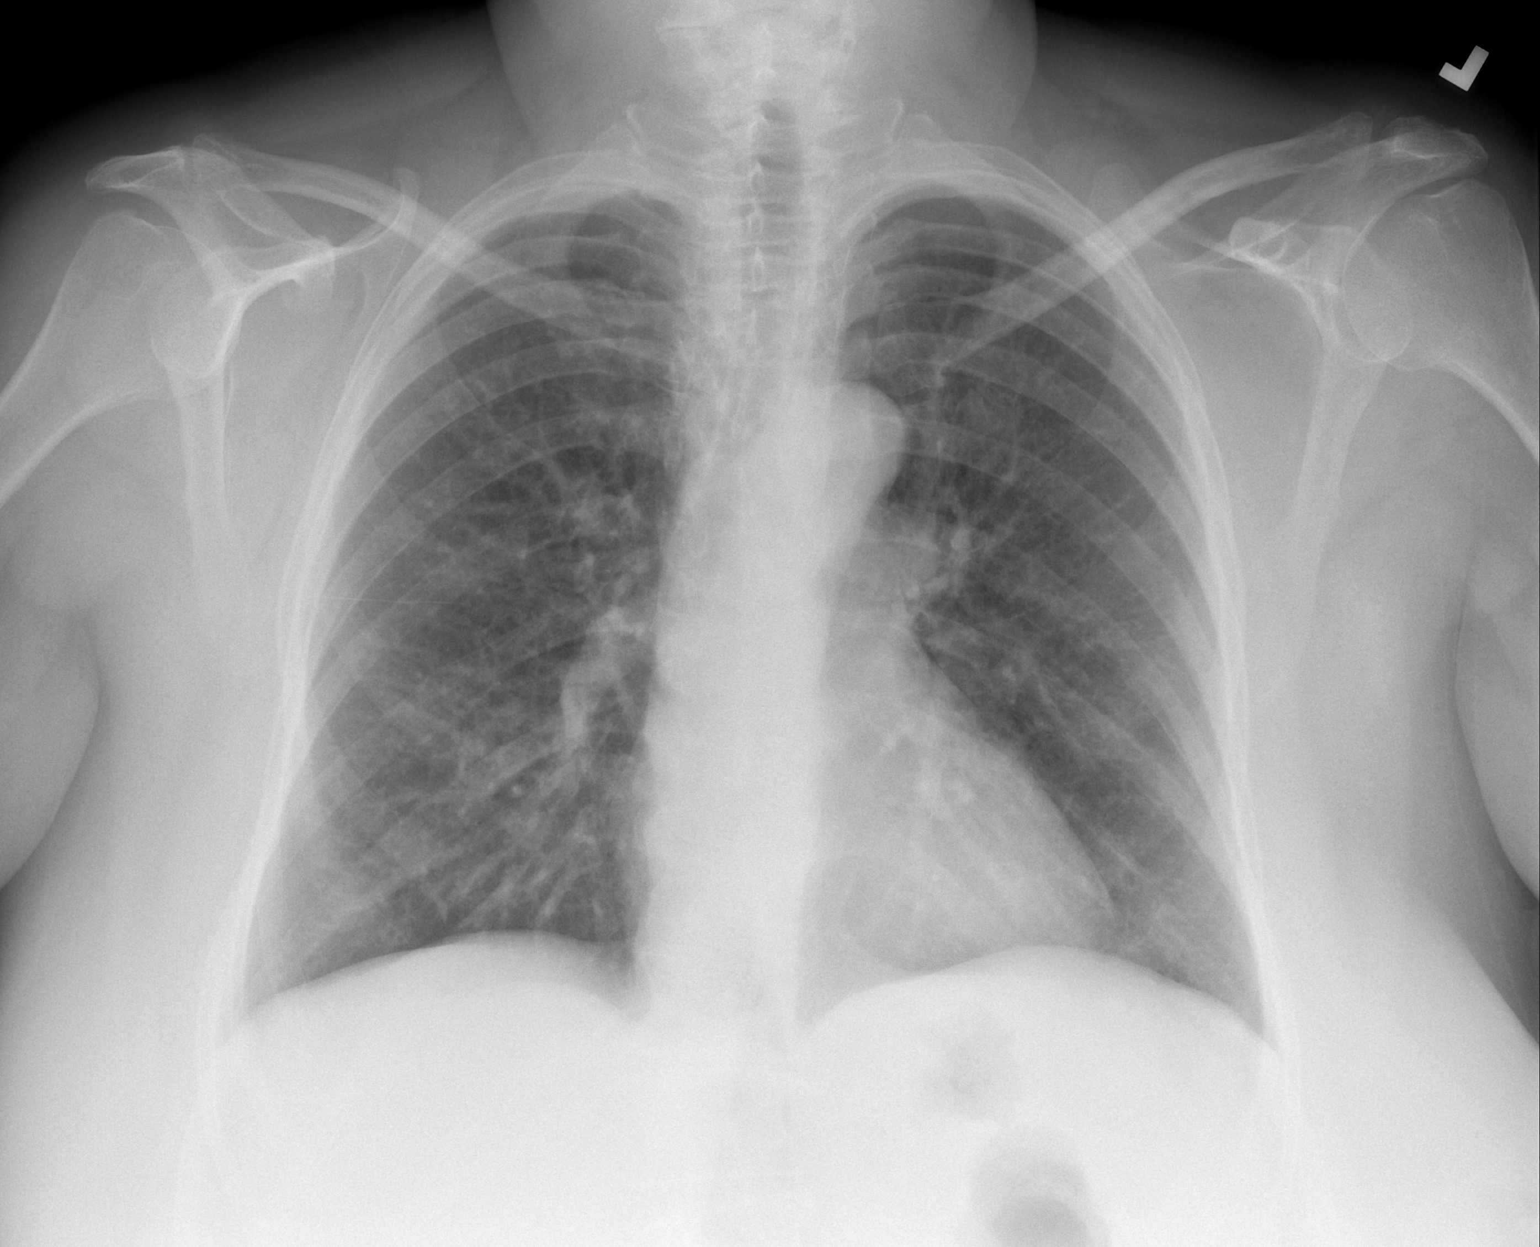

[w chest lat]
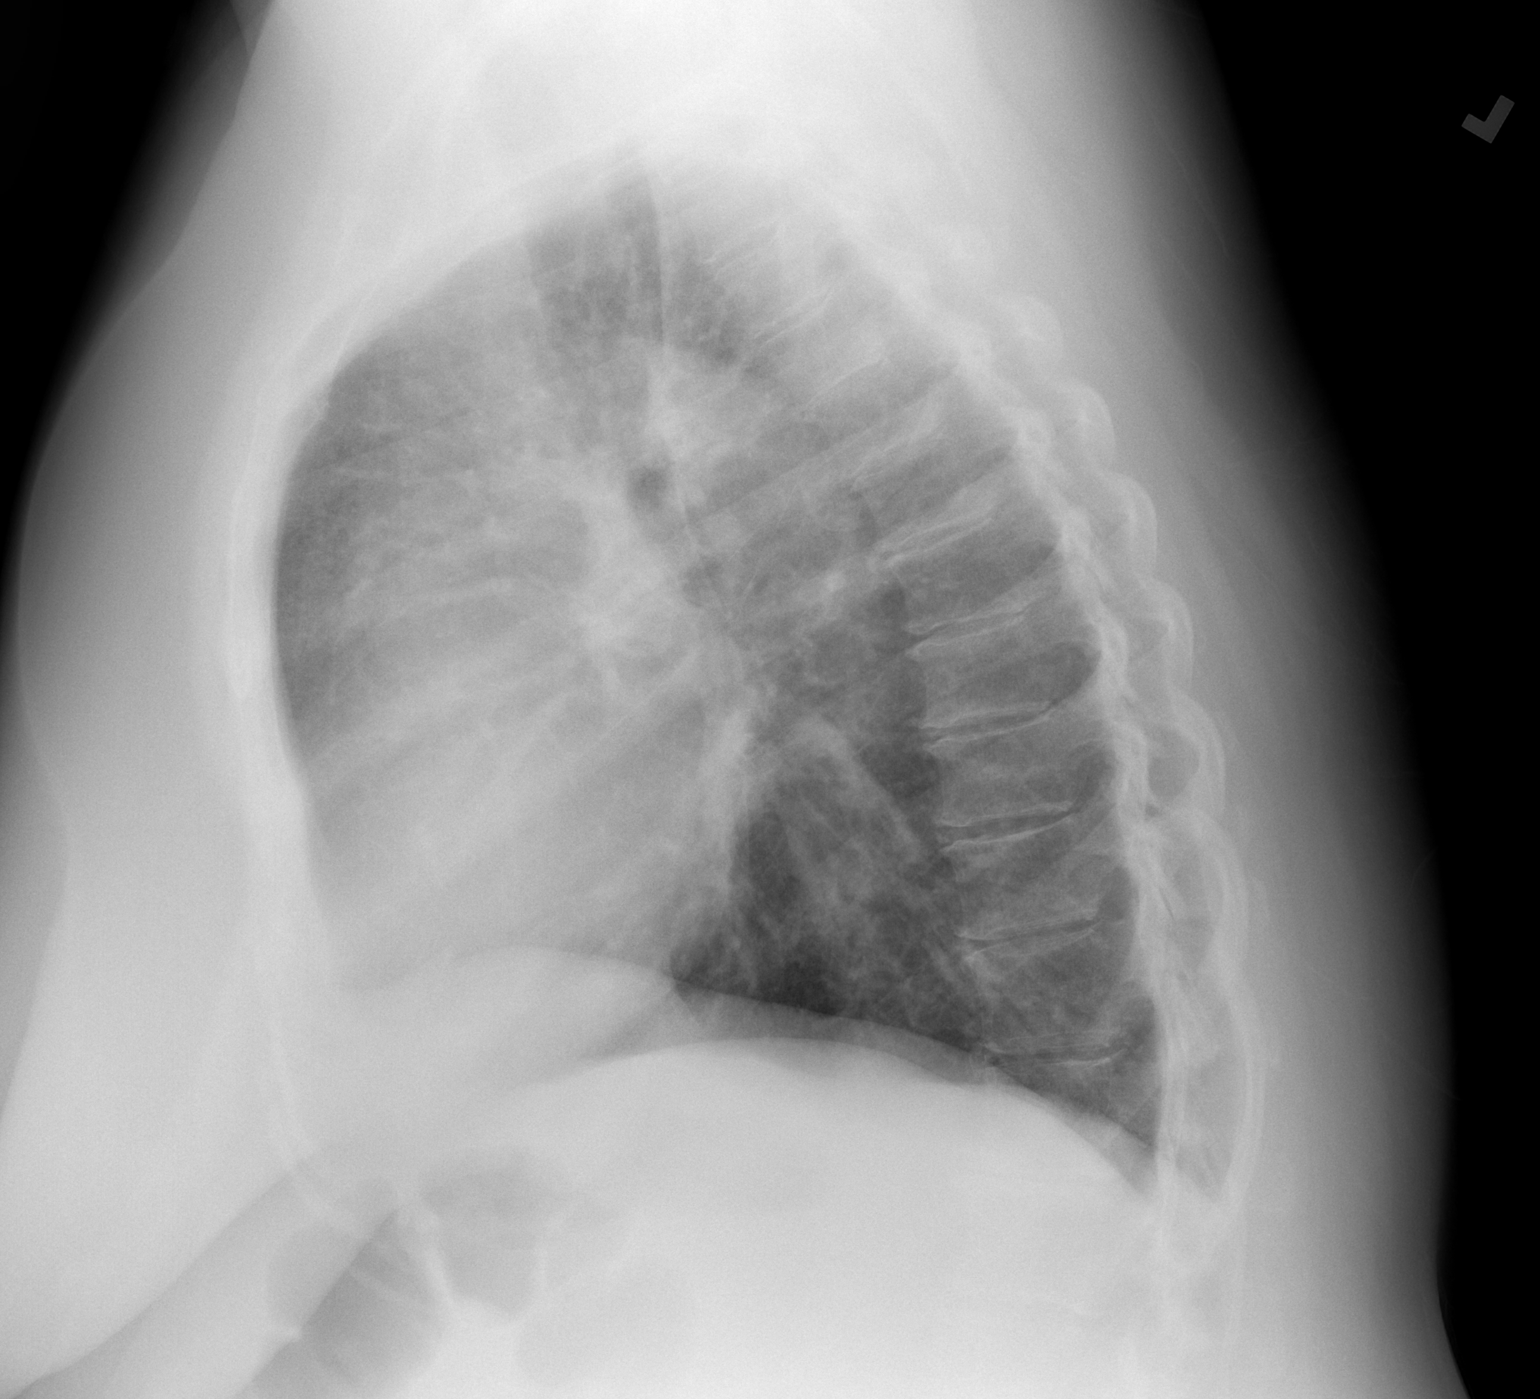

[2 of 2 positions shown; findings below may reference images not displayed]

FINDINGS: Heart size within normal limits. No appreciable airspace
consolidation. No evidence of pleural effusion or pneumothorax. No
acute bony abnormality identified.
IMPRESSION: No evidence of active cardiopulmonary disease.

## 2024-01-19 ENCOUNTER — Telehealth: Payer: PRIVATE HEALTH INSURANCE | Admitting: Family Medicine

## 2024-01-19 ENCOUNTER — Encounter: Payer: Self-pay | Admitting: Family Medicine

## 2024-01-19 DIAGNOSIS — M545 Low back pain, unspecified: Secondary | ICD-10-CM

## 2024-01-19 DIAGNOSIS — R300 Dysuria: Secondary | ICD-10-CM

## 2024-01-19 DIAGNOSIS — F411 Generalized anxiety disorder: Secondary | ICD-10-CM

## 2024-01-19 DIAGNOSIS — R319 Hematuria, unspecified: Secondary | ICD-10-CM

## 2024-01-19 MED ORDER — ALPRAZOLAM 0.5 MG PO TABS: ORAL_TABLET | ORAL | 1 refills | Status: DC

## 2024-01-19 NOTE — Telephone Encounter (Signed)
 Requesting: alprazolam  0.5mg   Contract: None recently seen UDS: 05/02/21 Last Visit: 10/21/23  Next Visit: 05/09/24 Last Refill: 11/09/23 #90 and 1RF   Please Advise

## 2024-01-19 NOTE — Progress Notes (Signed)
  Because you are having left sided back and pelvic pain , and bleeding with UTI symptoms we need to have you seen in person to make sure it is not a stone or kidney infection, feel your condition warrants further evaluation and recommend that you be seen in a face-to-face visit.   NOTE: There will be NO CHARGE for this E-Visit   If you are having a true medical emergency, please call 911.

## 2024-01-28 ENCOUNTER — Other Ambulatory Visit: Payer: Self-pay

## 2024-01-28 DIAGNOSIS — M170 Bilateral primary osteoarthritis of knee: Secondary | ICD-10-CM

## 2024-01-28 MED ORDER — PANTOPRAZOLE SODIUM 40 MG PO TBEC: 40.0000 mg | DELAYED_RELEASE_TABLET | Freq: Every day | ORAL | 3 refills | Status: AC

## 2024-01-29 MED ORDER — DICLOFENAC SODIUM 75 MG PO TBEC: 75.0000 mg | DELAYED_RELEASE_TABLET | Freq: Two times a day (BID) | ORAL | 1 refills | Status: DC

## 2024-02-01 ENCOUNTER — Encounter: Payer: Self-pay | Admitting: Family Medicine

## 2024-02-03 ENCOUNTER — Other Ambulatory Visit: Payer: Self-pay | Admitting: Family Medicine

## 2024-02-03 DIAGNOSIS — R109 Unspecified abdominal pain: Secondary | ICD-10-CM

## 2024-02-03 MED ORDER — TRAMADOL HCL 50 MG PO TABS: 50.0000 mg | ORAL_TABLET | Freq: Three times a day (TID) | ORAL | 0 refills | Status: AC | PRN

## 2024-02-04 ENCOUNTER — Ambulatory Visit: Payer: Self-pay

## 2024-02-04 NOTE — Telephone Encounter (Signed)
 Spoke with PCP. Pt scheduled tomorrow at 1040. Pt advised to go to ED is sxs worsen. Pt verbalized understanding.

## 2024-02-04 NOTE — Telephone Encounter (Signed)
 See below. Pt is having bloody stools. Please advise

## 2024-02-04 NOTE — Telephone Encounter (Signed)
 FYI Only or Action Required?: Action required by provider: request for appointment.  Patient was last seen in primary care on 11/09/2023 by Deborah Watts, Deborah SAUNDERS, DO.  Called Nurse Triage reporting Leg Swelling.  Symptoms began today.  Interventions attempted: Nothing.  Symptoms are: unchanged.  Triage Disposition: See Physician Within 24 Hours  Patient/caregiver understands and will follow disposition?: No, wishes to speak with PCP   Copied from CRM #[id number]. Topic: Clinical - Red Word Triage >> Feb 04, 2024 12:28 PM Harlene ORN wrote: Red Word that prompted transfer to Nurse Triage: was told to call back about her symptoms for swelling in her legs/ leg pain/ blood in her stool this morning. Reason for Disposition  MODERATE rectal bleeding (e.g., small blood clots, passing blood without stool, or toilet water turns red)  Answer Assessment - Initial Assessment Questions No available appts with pcp. Offered to scheduled with alternative provider, patient declined and requests to only see [name].  Advised call back or ED if symptoms worsen.  Today, attempted to have BM, no stool, only 2 bright red, size dime, blood clots in toliet, wiped bright red blood.  Reports took Magnesium Citrate this morning; brown loose stools, denies abdominal pain, mucous Past 24 hours: 4x loose stools  1. ONSET: When did the swelling start? (e.g., minutes, hours, days)     Chronic leg swelling/pain; already made f/u appt  Denies difficulty breathing, sob 2. LOCATION: What part of the leg is swollen?  Are both legs swollen or just one leg?     Both  3. SEVERITY: How bad is the swelling? (e.g., localized; mild, moderate, severe)     mild 4. REDNESS: Is there redness or signs of infection?     Redness lower legs; anteriorly 5. PAIN: Is the swelling painful to touch? If Yes, ask: How painful is it?   (Scale 1-10; mild, moderate or severe)      5/10; able to walk and stand 6.  FEVER: Do you have a fever? If Yes, ask: What is it, how was it measured, and when did it start?      Denies fever, chills, n/v  8. MEDICAL HISTORY: Do you have a history of blood clots (e.g., DVT), cancer, heart failure, kidney disease, or liver failure?     Lasix  for swelling; not working well 9. RECURRENT SYMPTOM: Have you had leg swelling before? If Yes, ask: When was the last time? What happened that time?     yes 10. OTHER SYMPTOMS: Do you have any other symptoms? (e.g., chest pain, difficulty breathing)      Denies dizziness, chest pain, sob  Answer Assessment - Initial Assessment Questions No available appts with pcp. Offered to scheduled with alternative provider, patient declined and requests to only see [name].  Advised call back or ED if symptoms worsen.  1. APPEARANCE of BLOOD: What color is it? Is it passed separately, on the surface of the stool, or mixed in with the stool?      Passed 2 red blood clots separately, size of dimes, wiped bright red blood on toilet tissue; x1  Today, attempted to have BM, no stool, only 2 bright red, size dime, blood clots in toliet, wiped bright red blood.  Reports took Magnesium Citrate this morning; brown loose stools, denies abdominal pain, mucous Past 24 hours: 4x loose stools  1. ONSET: When did the swelling start? (e.g., minutes, hours, days)     Chronic leg swelling/pain; already made f/u appt  Denies difficulty breathing, sob 2. LOCATION: What part of the leg is swollen?  Are both legs swollen or just one leg?     Both  3. SEVERITY: How bad is the swelling? (e.g., localized; mild, moderate, severe)     mild 4. REDNESS: Is there redness or signs of infection?     Redness lower legs; anteriorly 5. PAIN: Is the swelling painful to touch? If Yes, ask: How painful is it?   (Scale 1-10; mild, moderate or severe)      5/10; able to walk and stand 6. FEVER: Do you have a fever? If Yes, ask:  What is it, how was it measured, and when did it start?      Denies fever, chills, n/v  8. MEDICAL HISTORY: Do you have a history of blood clots (e.g., DVT), cancer, heart failure, kidney disease, or liver failure?     Lasix  for swelling; not working well 9. RECURRENT SYMPTOM: Have you had leg swelling before? If Yes, ask: When was the last time? What happened that time?     yes 10. OTHER SYMPTOMS: Do you have any other symptoms? (e.g., chest pain, difficulty breathing)      Denies dizziness, chest pain, sob  Protocols used: Leg Swelling and Edema-A-AH, Rectal Bleeding-A-AH

## 2024-02-05 ENCOUNTER — Ambulatory Visit (INDEPENDENT_AMBULATORY_CARE_PROVIDER_SITE_OTHER): Payer: PRIVATE HEALTH INSURANCE | Admitting: Family Medicine

## 2024-02-05 ENCOUNTER — Ambulatory Visit: Payer: Self-pay | Admitting: Family Medicine

## 2024-02-05 ENCOUNTER — Encounter: Payer: Self-pay | Admitting: Family Medicine

## 2024-02-05 VITALS — BP 132/80 | HR 76 | Temp 97.7°F | Resp 18 | Ht 59.0 in | Wt 232.0 lb

## 2024-02-05 DIAGNOSIS — K625 Hemorrhage of anus and rectum: Secondary | ICD-10-CM | POA: Diagnosis not present

## 2024-02-05 DIAGNOSIS — R1032 Left lower quadrant pain: Secondary | ICD-10-CM

## 2024-02-05 DIAGNOSIS — R109 Unspecified abdominal pain: Secondary | ICD-10-CM | POA: Diagnosis not present

## 2024-02-05 LAB — COMPREHENSIVE METABOLIC PANEL WITH GFR
ALT: 28 U/L (ref 0–35)
AST: 26 U/L (ref 0–37)
Albumin: 4.1 g/dL (ref 3.5–5.2)
Alkaline Phosphatase: 87 U/L (ref 39–117)
BUN: 12 mg/dL (ref 6–23)
CO2: 33 meq/L — ABNORMAL HIGH (ref 19–32)
Calcium: 8.5 mg/dL (ref 8.4–10.5)
Chloride: 102 meq/L (ref 96–112)
Creatinine, Ser: 0.83 mg/dL (ref 0.40–1.20)
GFR: 77.06 mL/min (ref >60.00)
Glucose, Bld: 109 mg/dL — ABNORMAL HIGH (ref 70–99)
Potassium: 4 meq/L (ref 3.5–5.1)
Sodium: 142 meq/L (ref 135–145)
Total Bilirubin: 0.3 mg/dL (ref 0.2–1.2)
Total Protein: 6.7 g/dL (ref 6.0–8.3)

## 2024-02-05 LAB — CBC WITH DIFFERENTIAL/PLATELET
Basophils Absolute: 0.1 K/uL (ref 0.0–0.1)
Basophils Relative: 0.9 % (ref 0.0–3.0)
Eosinophils Absolute: 0.4 K/uL (ref 0.0–0.7)
Eosinophils Relative: 4.2 % (ref 0.0–5.0)
HCT: 41.4 % (ref 36.0–46.0)
Hemoglobin: 13.4 g/dL (ref 12.0–15.0)
Lymphocytes Relative: 26.3 % (ref 12.0–46.0)
Lymphs Abs: 2.4 K/uL (ref 0.7–4.0)
MCHC: 32.4 g/dL (ref 30.0–36.0)
MCV: 87.8 fl (ref 78.0–100.0)
Monocytes Absolute: 0.4 K/uL (ref 0.1–1.0)
Monocytes Relative: 4.8 % (ref 3.0–12.0)
Neutro Abs: 5.9 K/uL (ref 1.4–7.7)
Neutrophils Relative %: 63.8 % (ref 43.0–77.0)
Platelets: 250 K/uL (ref 150.0–400.0)
RBC: 4.71 Mil/uL (ref 3.87–5.11)
RDW: 15.9 % — ABNORMAL HIGH (ref 11.5–15.5)
WBC: 9.3 K/uL (ref 4.0–10.5)

## 2024-02-05 LAB — LIPASE: Lipase: 9 U/L — ABNORMAL LOW (ref 11.0–59.0)

## 2024-02-05 LAB — AMYLASE: Amylase: 20 U/L — ABNORMAL LOW (ref 27–131)

## 2024-02-05 MED ORDER — CIPROFLOXACIN HCL 500 MG PO TABS: 500.0000 mg | ORAL_TABLET | Freq: Two times a day (BID) | ORAL | 0 refills | Status: AC

## 2024-02-05 MED ORDER — METRONIDAZOLE 500 MG PO TABS: 500.0000 mg | ORAL_TABLET | Freq: Three times a day (TID) | ORAL | 0 refills | Status: AC

## 2024-02-05 NOTE — Progress Notes (Signed)
 Subjective:    Patient ID: Deborah Watts, female    DOB: 1965-06-06, 58 y.o.   MRN: [id number]  Chief Complaint  Patient presents with   Rectal Bleeding    Pt states having a bloody stools yesterday, pt states seeing blood in stool and with wiping. No abdominal pain, no nausea/vomiting or constipation or diarrha.    HPI Patient is in today for blood in stool.   Discussed the use of AI scribe software for clinical note transcription with the patient, who gave verbal consent to proceed.  History of Present Illness Deborah Watts is a 58 year old female who presents with rectal bleeding.  She has noticed blood in her stool, describing it as 'two dime sizes' and significant blood on wiping. She is not aware of any hemorrhoids and is unsure if she has been straining during bowel movements. She describes a sensation of her buttocks feeling 'fluffy' or swollen, particularly last night.  She experiences occasional stomach pain, sometimes on the left side. She reports that she had an endoscopy and colonoscopy in July, and was told that everything was good except for the presence of diverticula. She recalls being told she does not need another colonoscopy for ten years.  She has a history of fatty liver and reports that her liver 'gets inflamed.' She denies having IBS or colon cancer. She has received her flu shot.  She is currently taking Lasix  40 mg daily for swelling, which persists despite elevating her legs. She does not wear compression stockings and experiences occasional shortness of breath, attributing it to her asthma. Her weight fluctuates around 219 pounds, which she attributes to fluid retention.    Past Medical History:  Diagnosis Date   Anxiety    Arthritis    Asthma    Headache    migraines   History of hiatal hernia    repaired 30 yrs ago in [place]   Hypertension    MHA (microangiopathic hemolytic anemia) (HCC)    Obesity     Past Surgical History:   Procedure Laterality Date   APPENDECTOMY N/A    BACK SURGERY     LAPAROSCOPIC CHOLECYSTECTOMY N/A 03/1992   OOPHORECTOMY Right    and also removed right fallopian tube, done abominally   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   REPLACEMENT TOTAL KNEE Right 01/15/2021   [facility]   TUBAL LIGATION     VAGINAL HYSTERECTOMY     partial   WRIST ARTHROSCOPY Right 02/12/2018   Procedure: Right wrist arthroscopy, evaluation under anesthesia with debridement and repair as necessary and right carpal tunnel release;  Surgeon: Camella Fallow, MD;  Location: [facility] OR;  Service: Orthopedics;  Laterality: Right;  90 mins    Family History  Problem Relation Age of Onset   Throat cancer Father    Kidney failure Father    Asthma Other    Diabetes Other    Hyperlipidemia Other    Colon cancer Neg Hx     Social History   Socioeconomic History   Marital status: Married    Spouse name: Not on file   Number of children: 2   Years of education: Not on file   Highest education level: Some college, no degree  Occupational History   Occupation: unemployed  Tobacco Use   Smoking status: Never   Smokeless tobacco: Never  Vaping Use   Vaping status: Never Used  Substance and Sexual Activity   Alcohol use: Not Currently  Comment: rare   Drug use: No   Sexual activity: Not on file  Other Topics Concern   Not on file  Social History Narrative   Exercise-- no   Social Drivers of Health   Financial Resource Strain: Low Risk  (02/04/2024)   Overall Financial Resource Strain (CARDIA)    Difficulty of Paying Living Expenses: Not very hard  Recent Concern: Financial Resource Strain - Medium Risk (12/15/2023)   Received from [facility]   Overall Financial Resource Strain (CARDIA)    How hard is it for you to pay for the very basics like food, housing, medical care, and heating?: Somewhat hard  Food Insecurity: No Food Insecurity (02/04/2024)   Hunger Vital Sign    Worried About  Running Out of Food in the Last Year: Never true    Ran Out of Food in the Last Year: Never true  Transportation Needs: No Transportation Needs (02/04/2024)   PRAPARE - Administrator, Civil Service (Medical): No    Lack of Transportation (Non-Medical): No  Physical Activity: Insufficiently Active (02/04/2024)   Exercise Vital Sign    Days of Exercise per Week: 1 day    Minutes of Exercise per Session: 10 min  Stress: Stress Concern Present (02/04/2024)   [employer] of Occupational Health - Occupational Stress Questionnaire    Feeling of Stress: To some extent  Social Connections: Socially Integrated (02/04/2024)   Social Connection and Isolation Panel    Frequency of Communication with Friends and Family: More than three times a week    Frequency of Social Gatherings with Friends and Family: Twice a week    Attends Religious Services: 1 to 4 times per year    Active Member of [employer] or Organizations: Yes    Attends Banker Meetings: 1 to 4 times per year    Marital Status: Married  Catering Manager Violence: Not At Risk (12/15/2023)   Received from [facility]   HITS    Over the last 12 months how often did your partner physically hurt you?: Never    Over the last 12 months how often did your partner insult you or talk down to you?: Never    Over the last 12 months how often did your partner threaten you with physical harm?: Never    Over the last 12 months how often did your partner scream or curse at you?: Never    Outpatient Medications Prior to Visit  Medication Sig Dispense Refill   albuterol  (PROVENTIL ) (2.5 MG/3ML) 0.083% nebulizer solution Take 3 mLs (2.5 mg total) by nebulization every 6 (six) hours as needed for wheezing or shortness of breath. 150 mL 1   albuterol  (VENTOLIN  HFA) 108 (90 Base) MCG/ACT inhaler Inhale 2 puffs into the lungs every 6 (six) hours as needed for wheezing or shortness of breath (Cough). 18 g 3   ALPRAZolam  (XANAX )  0.5 MG tablet TAKE 1 TABLET BY MOUTH THREE TIMES PER DAY AS NEEDED 90 tablet 1   azelastine  (ASTELIN ) 0.1 % nasal spray Place 1 spray into both nostrils 2 (two) times daily. Use in each nostril as directed 30 mL 12   clotrimazole -betamethasone  (LOTRISONE ) cream Apply 1 Application topically daily. 30 g 0   diclofenac  (VOLTAREN ) 75 MG EC tablet Take 1 tablet (75 mg total) by mouth 2 (two) times daily. 60 tablet 1   escitalopram  (LEXAPRO ) 20 MG tablet Take 1 tablet (20 mg total) by mouth daily. 90 tablet 3   fluconazole  (DIFLUCAN )  150 MG tablet Take 1 tablet (150 mg) now.  Repeat in 5 to 7 days. (Patient taking differently: Take 150 mg by mouth. As needed) 2 tablet 0   fluticasone  (FLONASE ) 50 MCG/ACT nasal spray Place 2 sprays into both nostrils daily. 16 g 0   furosemide  (LASIX ) 40 MG tablet Take 1 tablet (40 mg total) by mouth daily. 30 tablet 3   HYDROcodone -acetaminophen  (NORCO/VICODIN) 5-325 MG tablet Take 1 tablet by mouth every 6 (six) hours as needed for moderate pain (pain score 4-6). 20 tablet 0   ipratropium (ATROVENT ) 0.03 % nasal spray Place 2 sprays into both nostrils every 12 (twelve) hours. 30 mL 0   Meclizine  HCl (TRAVEL SICKNESS) 25 MG CHEW CHEW 1 TABLET EVERY 6 HOURS AS NEEDED 30 tablet 1   methocarbamol  (ROBAXIN -750) 750 MG tablet Take 1 tablet (750 mg total) by mouth every 6 (six) hours as needed for muscle spasms.     montelukast  (SINGULAIR ) 10 MG tablet Take 1 tablet (10 mg total) by mouth at bedtime. 90 tablet 3   ondansetron  (ZOFRAN -ODT) 4 MG disintegrating tablet Take 1 tablet (4 mg total) by mouth every 8 (eight) hours as needed for nausea or vomiting. 20 tablet 0   pantoprazole  (PROTONIX ) 40 MG tablet Take 1 tablet (40 mg total) by mouth daily. 30 tablet 3   pregabalin  (LYRICA ) 300 MG capsule Take 1 capsule (300 mg total) by mouth 2 (two) times daily. 180 capsule 1   promethazine -dextromethorphan (PROMETHAZINE -DM) 6.25-15 MG/5ML syrup Take 5 mLs by mouth 4 (four) times  daily as needed for cough. 118 mL 0   SUMAtriptan  (IMITREX ) 100 MG tablet TAKE 1 TABLET BY MOUTH AS NEEDED FOR MIGRAINE. REPEAT IN 2 HOURS IF NEEDED 12 tablet 3   topiramate  (TOPAMAX ) 50 MG tablet Take 1 tablet (50 mg total) by mouth 2 (two) times daily. 180 tablet 1   traMADol  (ULTRAM ) 50 MG tablet Take 1 tablet (50 mg total) by mouth every 8 (eight) hours as needed. 30 tablet 0   Vitamin D , Ergocalciferol , (DRISDOL ) 1.25 MG (50000 UNIT) CAPS capsule Take 1 capsule (50,000 Units total) by mouth every 7 (seven) days. 5 capsule 12   No facility-administered medications prior to visit.    Allergies  Allergen Reactions   Fish Allergy Hives and Swelling   Iohexol  Other (See Comments)     Desc: hives, sob, pt. needs 13 hr prep   01/16/05    Ivp Dye [Iodinated Contrast Media] Hives and Swelling   Penicillins Anaphylaxis, Hives and Other (See Comments)    Has patient had a PCN reaction causing immediate rash, facial/tongue/throat swelling, SOB or lightheadedness with hypotension: Unknown Has patient had a PCN reaction causing severe rash involving mucus membranes or skin necrosis: No Has patient had a PCN reaction that required hospitalization: Yes Has patient had a PCN reaction occurring within the last 10 years: No If all of the above answers are NO, then may proceed with Cephalosporin use.    Tizanidine Hcl Nausea And Vomiting, Rash and Shortness Of Breath   Morphine  And Codeine  Nausea And Vomiting    Review of Systems  Constitutional:  Negative for fever and malaise/fatigue.  HENT:  Negative for congestion.   Eyes:  Negative for blurred vision.  Respiratory:  Negative for shortness of breath.   Cardiovascular:  Negative for chest pain, palpitations and leg swelling.  Gastrointestinal:  Positive for blood in stool. Negative for abdominal pain, constipation, diarrhea and nausea.  Genitourinary:  Negative for dysuria and  frequency.  Musculoskeletal:  Negative for falls.  Skin:   Negative for rash.  Neurological:  Negative for dizziness, loss of consciousness and headaches.  Endo/Heme/Allergies:  Negative for environmental allergies.  Psychiatric/Behavioral:  Negative for depression. The patient is not nervous/anxious.        Objective:    Physical Exam Vitals and nursing note reviewed.  Constitutional:      General: She is not in acute distress.    Appearance: Normal appearance. She is well-developed.  HENT:     Head: Normocephalic and atraumatic.  Eyes:     General: No scleral icterus.       Right eye: No discharge.        Left eye: No discharge.  Cardiovascular:     Rate and Rhythm: Normal rate and regular rhythm.     Heart sounds: No murmur heard. Pulmonary:     Effort: Pulmonary effort is normal. No respiratory distress.     Breath sounds: Normal breath sounds.  Abdominal:     Tenderness: There is abdominal tenderness in the left lower quadrant. There is guarding. There is no right CVA tenderness, left CVA tenderness or rebound.  Musculoskeletal:        General: Normal range of motion.     Cervical back: Normal range of motion and neck supple.     Right lower leg: No edema.     Left lower leg: No edema.  Skin:    General: Skin is warm and dry.  Neurological:     Mental Status: She is alert and oriented to person, place, and time.  Psychiatric:        Mood and Affect: Mood normal.        Behavior: Behavior normal.        Thought Content: Thought content normal.        Judgment: Judgment normal.     BP 132/80 (BP Location: Right Arm, Patient Position: Sitting, Cuff Size: Large)   Pulse 76   Temp 97.7 F (36.5 C) (Oral)   Resp 18   Ht 4' 11 (1.499 m)   Wt 232 lb (105.2 kg)   SpO2 96%   BMI 46.86 kg/m  Wt Readings from Last 3 Encounters:  02/05/24 232 lb (105.2 kg)  12/30/23 226 lb (102.5 kg)  11/09/23 226 lb 3.2 oz (102.6 kg)    Diabetic Foot Exam - Simple   No data filed    Lab Results  Component Value Date   WBC 9.0  11/09/2023   HGB 13.9 11/09/2023   HCT 42.5 11/09/2023   PLT 235.0 11/09/2023   GLUCOSE 92 11/09/2023   CHOL 159 11/09/2023   TRIG 173.0 (H) 11/09/2023   HDL 28.60 (L) 11/09/2023   LDLDIRECT 100.0 07/21/2022   LDLCALC 96 11/09/2023   ALT 25 11/09/2023   AST 22 11/09/2023   NA 142 11/09/2023   K 4.1 11/09/2023   CL 104 11/09/2023   CREATININE 0.83 11/09/2023   BUN 10 11/09/2023   CO2 28 11/09/2023   TSH 0.55 11/09/2023   INR 1.1 (H) 04/09/2023   HGBA1C 6.6 (H) 11/09/2023    Lab Results  Component Value Date   TSH 0.55 11/09/2023   Lab Results  Component Value Date   WBC 9.0 11/09/2023   HGB 13.9 11/09/2023   HCT 42.5 11/09/2023   MCV 87.6 11/09/2023   PLT 235.0 11/09/2023   Lab Results  Component Value Date   NA 142 11/09/2023   K 4.1 11/09/2023  CO2 28 11/09/2023   GLUCOSE 92 11/09/2023   BUN 10 11/09/2023   CREATININE 0.83 11/09/2023   BILITOT 0.4 11/09/2023   ALKPHOS 101 11/09/2023   AST 22 11/09/2023   ALT 25 11/09/2023   PROT 6.7 11/09/2023   ALBUMIN 4.1 11/09/2023   CALCIUM 8.7 11/09/2023   ANIONGAP 11 08/18/2023   EGFR 85 08/28/2023   GFR 77.19 11/09/2023   Lab Results  Component Value Date   CHOL 159 11/09/2023   Lab Results  Component Value Date   HDL 28.60 (L) 11/09/2023   Lab Results  Component Value Date   LDLCALC 96 11/09/2023   Lab Results  Component Value Date   TRIG 173.0 (H) 11/09/2023   Lab Results  Component Value Date   CHOLHDL 6 11/09/2023   Lab Results  Component Value Date   HGBA1C 6.6 (H) 11/09/2023       Assessment & Plan:  Abdominal pain, unspecified abdominal location  LLQ pain -     CBC with Differential/Platelet -     Comprehensive metabolic panel with GFR -     Amylase -     Lipase -     Ciprofloxacin  HCl; Take 1 tablet (500 mg total) by mouth 2 (two) times daily for 7 days.  Dispense: 14 tablet; Refill: 0 -     metroNIDAZOLE ; Take 1 tablet (500 mg total) by mouth 3 (three) times daily for 7 days.   Dispense: 21 tablet; Refill: 0  Rectal bleed -     Ambulatory referral to Gastroenterology   Assessment and Plan Assessment & Plan Rectal bleeding and suspected acute diverticulitis with left lower quadrant abdominal pain   She experiences rectal bleeding with two dime-sized amounts of blood and has no known hemorrhoids. A recent colonoscopy in July showed diverticula. Left lower quadrant abdominal pain suggests possible acute diverticulitis, likely due to inflamed diverticula. She is allergic to penicillin and contrast and prefers to avoid steroids due to weight concerns. Prescribe Cipro  and Flagyl  for 7 days. Advise her to go to the emergency room if symptoms worsen.  Edema   Persistent edema continues despite Lasix  40 mg daily. She reports swelling in her legs and occasional shortness of breath, possibly related to asthma. She dislikes using compression stockings. Increase Lasix  to 80 mg over the weekend. Consider an ultrasound of the heart if edema persists.  Fatty liver disease   She reports liver inflammation.  Asthma   She experiences occasional shortness of breath, possibly related to asthma.  Obesity   Her weight fluctuates and is currently at 219 lbs. She is following a diet plan but has not lost significant weight. She is attending a weight loss program but is not meeting expectations. Continue following the diet plan and monitor weight and fluid retention.   Leeandra Ellerson R Lowne Chase, DO

## 2024-02-08 ENCOUNTER — Ambulatory Visit: Payer: PRIVATE HEALTH INSURANCE | Admitting: Family Medicine

## 2024-02-08 DIAGNOSIS — Z79899 Other long term (current) drug therapy: Secondary | ICD-10-CM

## 2024-02-08 DIAGNOSIS — F411 Generalized anxiety disorder: Secondary | ICD-10-CM

## 2024-02-08 DIAGNOSIS — R109 Unspecified abdominal pain: Secondary | ICD-10-CM

## 2024-02-27 ENCOUNTER — Other Ambulatory Visit: Payer: Self-pay | Admitting: Family Medicine

## 2024-02-27 DIAGNOSIS — H8110 Benign paroxysmal vertigo, unspecified ear: Secondary | ICD-10-CM

## 2024-02-27 DIAGNOSIS — G8929 Other chronic pain: Secondary | ICD-10-CM

## 2024-02-29 NOTE — Telephone Encounter (Signed)
 Requesting: Lyrica  300mg   Contract:05/02/21 UDS: 05/02/21 Last Visit: 02/05/24 Next Visit: 05/09/24 Last Refill: 05/08/23 #180 and 1RF   Please Advise

## 2024-03-01 ENCOUNTER — Other Ambulatory Visit: Payer: Self-pay | Admitting: Family Medicine

## 2024-03-01 DIAGNOSIS — R600 Localized edema: Secondary | ICD-10-CM

## 2024-03-15 ENCOUNTER — Other Ambulatory Visit: Payer: Self-pay | Admitting: Family Medicine

## 2024-03-15 DIAGNOSIS — F411 Generalized anxiety disorder: Secondary | ICD-10-CM

## 2024-03-15 NOTE — Telephone Encounter (Signed)
 Requesting: alprazolam  0.5mg   Contract: 05/02/21 UDS:05/02/21  Last Visit: 02/05/24 Next Visit: 05/09/24 Last Refill: 01/19/24 #90 and 1RF   Please Advise

## 2024-03-18 ENCOUNTER — Other Ambulatory Visit: Payer: Self-pay | Admitting: Family Medicine

## 2024-03-18 DIAGNOSIS — J45909 Unspecified asthma, uncomplicated: Secondary | ICD-10-CM

## 2024-03-18 DIAGNOSIS — M170 Bilateral primary osteoarthritis of knee: Secondary | ICD-10-CM

## 2024-03-20 ENCOUNTER — Telehealth: Payer: PRIVATE HEALTH INSURANCE | Admitting: Nurse Practitioner

## 2024-03-20 DIAGNOSIS — R309 Painful micturition, unspecified: Secondary | ICD-10-CM

## 2024-03-20 NOTE — Progress Notes (Signed)
" °  Because you have a thick discharge, nausea and vomiting and sharp abdominal pain which are not usually associated with an uncomplicated UTI, I feel your condition warrants further evaluation and I recommend that you be seen for a face to face visit.      NOTE: There will be NO CHARGE for this E-Visit   If you are having a true medical emergency, please call 911.     For an urgent face to face visit, [facility] has multiple urgent care centers for your convenience.  Click the link below for the full list of locations and hours, walk-in wait times, appointment scheduling options and driving directions:  Urgent Care - [facility], [place], [place], [place], [place], [place]  [place]     Your MyChart E-visit questionnaire answers were reviewed by a board certified advanced clinical practitioner to complete your personal care plan based on your specific symptoms.    Thank you for using e-Visits.    "

## 2024-03-21 ENCOUNTER — Encounter (HOSPITAL_BASED_OUTPATIENT_CLINIC_OR_DEPARTMENT_OTHER): Payer: Self-pay

## 2024-03-21 ENCOUNTER — Ambulatory Visit (HOSPITAL_BASED_OUTPATIENT_CLINIC_OR_DEPARTMENT_OTHER)
Admission: RE | Admit: 2024-03-21 | Discharge: 2024-03-21 | Disposition: A | Discharge status: Home / Self Care | Source: Ambulatory Visit | Attending: Nurse Practitioner | Admitting: Nurse Practitioner

## 2024-03-21 VITALS — BP 111/73 | HR 77 | Temp 97.8°F | Resp 20

## 2024-03-21 DIAGNOSIS — R300 Dysuria: Secondary | ICD-10-CM | POA: Diagnosis not present

## 2024-03-21 LAB — POCT URINE DIPSTICK
Bilirubin, UA: NEGATIVE
Blood, UA: NEGATIVE
Glucose, UA: NEGATIVE mg/dL
Ketones, POC UA: NEGATIVE mg/dL
Leukocytes, UA: NEGATIVE
Nitrite, UA: NEGATIVE
Protein Ur, POC: NEGATIVE mg/dL
Spec Grav, UA: 1.02
Urobilinogen, UA: 0.2 U/dL
pH, UA: 5.5

## 2024-03-21 MED ORDER — PHENAZOPYRIDINE HCL 200 MG PO TABS: 200.0000 mg | ORAL_TABLET | ORAL | 0 refills | Status: AC

## 2024-03-21 MED ORDER — NITROFURANTOIN MONOHYD MACRO 100 MG PO CAPS: 100.0000 mg | ORAL_CAPSULE | Freq: Two times a day (BID) | ORAL | 0 refills | Status: AC

## 2024-03-21 MED ORDER — ONDANSETRON 8 MG PO TBDP: 8.0000 mg | ORAL_TABLET | Freq: Three times a day (TID) | ORAL | 0 refills | Status: AC | PRN

## 2024-03-21 NOTE — ED Triage Notes (Signed)
 Pt c/o pain in her bladder area on right side, urinary burning and urinary frequency since Friday.

## 2024-03-21 NOTE — ED Provider Notes (Signed)
 " [facility]    CSN: [id number] Arrival date & time: 03/21/24  1154      History   Chief Complaint Chief Complaint  Patient presents with   Urinary Frequency   Dysuria    HPI Deborah Watts is a 59 y.o. female.   Discussed the use of AI scribe software for clinical note transcription with the patient, who gave verbal consent to proceed.   Deborah Watts presents with urinary symptoms including pain and frequency with urination that started 3-4 days ago. She reports burning with urination, increased urinary frequency, and occasional urgency, particularly when taking her fluid pills. She notes some urinary odor but denies itching. Associated symptoms include chills, lower back pain, nausea, and lower abdominal pain. She reports wiping blood streaks but denies blood in her urine or stool. She has been taking tramadol , which she normally uses for migraines, to help with her symptoms. The patient maintains good hydration, typically drinking over 60 ounces of water daily, though she reports water tasting unpleasant recently and has been drinking some apple juice instead.  The following sections of the patient's history were reviewed and updated as appropriate: allergies, current medications, past family history, past medical history, past social history, past surgical history, and problem list.     Past Medical History:  Diagnosis Date   Anxiety    Arthritis    Asthma    Headache    migraines   History of hiatal hernia    repaired 30 yrs ago in [place]   Hypertension    MHA (microangiopathic hemolytic anemia) (HCC)    Obesity     Patient Active Problem List   Diagnosis Date Noted   ANA positive 04/23/2023   Right upper quadrant abdominal pain 03/12/2023   Complex small left ovarian cyst on ultrasound 03/12/2023   Left lower quadrant abdominal pain 03/02/2023   Urinary frequency 03/02/2023   Iron deficiency anemia 03/02/2023   Pulmonary nodules 02/20/2023    Suspected sleep apnea 02/20/2023   Allergic rhinitis 02/20/2023   Pain of left calf 08/12/2022   Cellulitis of left lower extremity 08/12/2022   Edema, lower extremity 07/21/2022   SOB (shortness of breath) 07/21/2022   Hyperlipidemia 07/21/2022   Major depressive disorder, recurrent episode, moderate (HCC) 11/25/2021   Generalized anxiety disorder 11/25/2021   Depression with anxiety 10/28/2021   Asthma, chronic, unspecified asthma severity, uncomplicated 05/02/2021   Loud snoring 11/28/2019   Daytime sleepiness 11/28/2019   Other fatigue 11/28/2019   Primary osteoarthritis of left knee 08/02/2019   Rib pain 10/19/2018   Right knee pain 04/12/2017   Preventative health care 03/25/2016   Left ankle pain 01/14/2016   Left leg pain 04/28/2014   Pain of right calf 04/20/2014   Obesity (BMI 30-39.9) 01/06/2013   Thrush, oral 03/04/2011   TINEA CRURIS 02/05/2010   HYPOKALEMIA 04/26/2009   Primary hypertension 02/08/2008   LOW BACK PAIN SYNDROME 05/26/2007   LEG CRAMPS 05/14/2007   Edema 05/14/2007   Chronic migraine without aura, with status migrainosus 05/01/2007   ASTHMATIC BRONCHITIS, ACUTE 10/15/2006   Anxiety state 04/20/2006   RESTLESS LEG SYNDROME, HX OF 04/20/2006    Past Surgical History:  Procedure Laterality Date   APPENDECTOMY N/A    BACK SURGERY     LAPAROSCOPIC CHOLECYSTECTOMY N/A 03/1992   OOPHORECTOMY Right    and also removed right fallopian tube, done abominally   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   [facility]   REPLACEMENT TOTAL KNEE Right  01/15/2021   [facility]   TUBAL LIGATION     VAGINAL HYSTERECTOMY     partial   WRIST ARTHROSCOPY Right 02/12/2018   Procedure: Right wrist arthroscopy, evaluation under anesthesia with debridement and repair as necessary and right carpal tunnel release;  Surgeon: Camella Fallow, MD;  Location: [facility] OR;  Service: Orthopedics;  Laterality: Right;  90 mins    OB History     Gravida  3   Para  2   Term  2    Preterm      AB  1   Living  2      SAB      IAB  1   Ectopic      Multiple      Live Births  2            Home Medications    Prior to Admission medications  Medication Sig Start Date End Date Taking? Authorizing Provider  nitrofurantoin , macrocrystal-monohydrate, (MACROBID ) 100 MG capsule Take 1 capsule (100 mg total) by mouth 2 (two) times daily. 03/21/24  Yes Iola Lukes, FNP  ondansetron  (ZOFRAN -ODT) 8 MG disintegrating tablet Take 1 tablet (8 mg total) by mouth every 8 (eight) hours as needed for nausea or vomiting. 03/21/24  Yes Gehrig Patras, FNP  phenazopyridine  (PYRIDIUM ) 200 MG tablet Take 1 tablet (200 mg total) by mouth 3 (three) times daily at 8am, 3pm and bedtime for 2 days. 03/21/24 03/23/24 Yes Iola Lukes, FNP  albuterol  (PROVENTIL ) (2.5 MG/3ML) 0.083% nebulizer solution Take 3 mLs (2.5 mg total) by nebulization every 6 (six) hours as needed for wheezing or shortness of breath. 12/30/22   Antonio Cyndee Jamee JONELLE, DO  albuterol  (VENTOLIN  HFA) 108 (90 Base) MCG/ACT inhaler Inhale 2 puffs into the lungs every 6 (six) hours as needed for wheezing or shortness of breath (Cough). 05/08/23   Antonio Cyndee, Jamee R, DO  ALPRAZolam  (XANAX ) 0.5 MG tablet TAKE 1 TABLET BY MOUTH THREE TIMES PER DAY AS NEEDED 03/15/24   Webb, Padonda B, FNP  azelastine  (ASTELIN ) 0.1 % nasal spray Place 1 spray into both nostrils 2 (two) times daily. Use in each nostril as directed 05/08/23   Antonio Cyndee, Yvonne R, DO  clotrimazole -betamethasone  (LOTRISONE ) cream Apply 1 Application topically daily. 08/28/23   Lowne Chase, Yvonne R, DO  diclofenac  (VOLTAREN ) 75 MG EC tablet TAKE ONE TABLET BY MOUTH TWICE DAILY 03/18/24   Antonio Cyndee, Yvonne R, DO  escitalopram  (LEXAPRO ) 20 MG tablet Take 1 tablet (20 mg total) by mouth daily. 11/09/23   Antonio Cyndee Jamee JONELLE, DO  fluconazole  (DIFLUCAN ) 150 MG tablet Take 1 tablet (150 mg) now.  Repeat in 5 to 7 days. Patient taking differently: Take 150 mg  by mouth. As needed 10/06/23   Ival Domino, FNP  fluticasone  (FLONASE ) 50 MCG/ACT nasal spray Place 2 sprays into both nostrils daily. 01/06/22   Moishe Chiquita HERO, NP  furosemide  (LASIX ) 40 MG tablet Take 1 tablet (40 mg total) by mouth daily. 03/01/24   Lowne Chase, Yvonne R, DO  HYDROcodone -acetaminophen  (NORCO/VICODIN) 5-325 MG tablet Take 1 tablet by mouth every 6 (six) hours as needed for moderate pain (pain score 4-6). 03/27/23   Lowne Chase, Yvonne R, DO  ipratropium (ATROVENT ) 0.03 % nasal spray Place 2 sprays into both nostrils every 12 (twelve) hours. 10/11/21   Vivienne Delon HERO, PA-C  Meclizine  HCl 25 MG CHEW CHEW 1 TABLET EVERY 6 HOURS AS NEEDED 02/29/24   Lowne Chase, Yvonne R, DO  methocarbamol  (  ROBAXIN -750) 750 MG tablet Take 1 tablet (750 mg total) by mouth every 6 (six) hours as needed for muscle spasms. 05/08/23   Antonio Cyndee Rockers R, DO  montelukast  (SINGULAIR ) 10 MG tablet TAKE ONE TABLET BY MOUTH AT BEDTIME 03/18/24   Antonio Cyndee, Yvonne R, DO  pantoprazole  (PROTONIX ) 40 MG tablet Take 1 tablet (40 mg total) by mouth daily. 01/28/24   Pyrtle, Gordy HERO, MD  pregabalin  (LYRICA ) 300 MG capsule TAKE 1 CAPSULE BY MOUTH TWICE DAILY 02/29/24   Antonio Cyndee, Yvonne R, DO  SUMAtriptan  (IMITREX ) 100 MG tablet TAKE 1 TABLET BY MOUTH AS NEEDED FOR MIGRAINE. REPEAT IN 2 HOURS IF NEEDED 05/08/23   Antonio Cyndee, Yvonne R, DO  topiramate  (TOPAMAX ) 50 MG tablet Take 1 tablet (50 mg total) by mouth 2 (two) times daily. 12/15/23   Lowne Chase, Yvonne R, DO  traMADol  (ULTRAM ) 50 MG tablet Take 1 tablet (50 mg total) by mouth every 8 (eight) hours as needed. 02/03/24   Antonio Cyndee Rockers JONELLE, DO  Vitamin D , Ergocalciferol , (DRISDOL ) 1.25 MG (50000 UNIT) CAPS capsule Take 1 capsule (50,000 Units total) by mouth every 7 (seven) days. 11/17/23   Antonio Cyndee Rockers JONELLE, DO    Family History Family History  Problem Relation Age of Onset   Throat cancer Father    Kidney failure Father    Asthma Other     Diabetes Other    Hyperlipidemia Other    Colon cancer Neg Hx     Social History Social History[1]   Allergies   Fish allergy, Iohexol , Ivp dye [iodinated contrast media], Penicillins, Tizanidine hcl, and Morphine  and codeine    Review of Systems Review of Systems  Constitutional:  Positive for chills. Negative for fever.  Gastrointestinal:  Positive for abdominal pain (right suprapubic discomfort) and nausea. Negative for vomiting.  Genitourinary:  Positive for dysuria, frequency and urgency. Negative for menstrual problem (s/p hysterectomy) and vaginal discharge.  Musculoskeletal:  Positive for back pain (lower).  All other systems reviewed and are negative.    Physical Exam Triage Vital Signs ED Triage Vitals [03/21/24 1222]  Encounter Vitals Group     BP 111/73     Girls Systolic BP Percentile      Girls Diastolic BP Percentile      Boys Systolic BP Percentile      Boys Diastolic BP Percentile      Pulse Rate 77     Resp 20     Temp 97.8 F (36.6 C)     Temp Source Oral     SpO2 94 %     Weight      Height      Head Circumference      Peak Flow      Pain Score      Pain Loc      Pain Education      Exclude from Growth Chart    No data found.  Updated Vital Signs BP 111/73 (BP Location: Right Arm)   Pulse 77   Temp 97.8 F (36.6 C) (Oral)   Resp 20   SpO2 94%   Visual Acuity Right Eye Distance:   Left Eye Distance:   Bilateral Distance:    Right Eye Near:   Left Eye Near:    Bilateral Near:     Physical Exam Constitutional:      General: She is not in acute distress.    Appearance: Normal appearance. She is not ill-appearing, toxic-appearing or diaphoretic.  HENT:  Head: Normocephalic.     Nose: Nose normal.     Mouth/Throat:     Mouth: Mucous membranes are moist.  Eyes:     Conjunctiva/sclera: Conjunctivae normal.  Cardiovascular:     Rate and Rhythm: Normal rate.  Pulmonary:     Effort: Pulmonary effort is normal.  Abdominal:      Palpations: Abdomen is soft.  Musculoskeletal:        General: Normal range of motion.     Cervical back: Normal range of motion and neck supple.  Skin:    General: Skin is warm and dry.  Neurological:     General: No focal deficit present.     Mental Status: She is alert and oriented to person, place, and time.  Psychiatric:        Mood and Affect: Mood normal.        Behavior: Behavior normal.      UC Treatments / Results  Labs (all labs ordered are listed, but only abnormal results are displayed) Labs Reviewed  POCT URINE DIPSTICK - Normal  URINE CULTURE    EKG   Radiology No results found.  Procedures Procedures (including critical care time)  Medications Ordered in UC Medications - No data to display  Initial Impression / Assessment and Plan / UC Course  I have reviewed the triage vital signs and the nursing notes.  Pertinent labs & imaging results that were available during my care of the patient were reviewed by me and considered in my medical decision making (see chart for details).     Patient presents with 3-day history of dysuria, urinary frequency, and urgency. Associated symptoms include malodorous urine, lower back pain, nausea, and lower abdominal pain. Reports blood streaks on wiping but denies hematuria or blood in stool. Urinalysis was negative for white blood cells, nitrates, and blood, appearing clear and yellow. Despite negative urinalysis, clinical presentation is consistent with UTI and urine culture will be sent to confirm bacterial growth. Nitrofurantoin  was prescribed to be taken twice daily for 5 days. Pyridium  was prescribed for urinary discomfort, to be taken three times daily for 2 days, with counseling that it may turn the urine orange. Patient was advised that they will be contacted only if the culture results require a change in treatment; otherwise, results can be reviewed via MyChart. Patient instructed to increase fluid intake and  monitor symptoms. Follow up with primary care provider if symptoms do not improve or worsen. Emergency evaluation is warranted for fever, back or flank pain, nausea, vomiting, or signs of systemic illness.  Today's evaluation has revealed no signs of a dangerous process. Discussed diagnosis with patient and/or guardian. Patient and/or guardian aware of their diagnosis, possible red flag symptoms to watch out for and need for close follow up. Patient and/or guardian understands verbal and written discharge instructions. Patient and/or guardian comfortable with plan and disposition.  Patient and/or guardian has a clear mental status at this time, good insight into illness (after discussion and teaching) and has clear judgment to make decisions regarding their care  Documentation was completed with the aid of voice recognition software. Transcription may contain typographical errors.   Final Clinical Impressions(s) / UC Diagnoses   Final diagnoses:  Dysuria     Discharge Instructions      You were seen today for symptoms consistent with a urinary tract infection (UTI). You have been prescribed Macrobid  to treat the infection and Pyridium  to help relieve discomfort such as burning, urgency, and bladder  pressure. Take the antibiotics exactly as prescribed and complete the full course, even if you start feeling better. Pyridium   may cause your urine to change color, which is a normal side effect of the medication. A urine culture has been sent to identify if there is a specific bacteria causing your symptoms. You will only be contacted if your results are abnormal; otherwise, you may review them in your MyChart account.   It is important to stay well hydrated by drinking plenty of fluids throughout the day. This helps flush out your urinary system and keeps your urine light yellow, which is a sign of good hydration. Avoid caffeine and alcohol, as they can irritate the bladder. Be sure to urinate  regularly and empty your bladder fully. Do not hold your urine for extended periods. Always wipe from front to back after using the bathroom and use a clean tissue for each wipe. Avoid douching or using sprays or powders in the genital area, as these can cause irritation. Follow up with your healthcare provider if your symptoms do not improve within a few days, get worse, or return after completing your treatment.     ED Prescriptions     Medication Sig Dispense Auth. Provider   nitrofurantoin , macrocrystal-monohydrate, (MACROBID ) 100 MG capsule Take 1 capsule (100 mg total) by mouth 2 (two) times daily. 10 capsule Iola Lukes, FNP   phenazopyridine  (PYRIDIUM ) 200 MG tablet Take 1 tablet (200 mg total) by mouth 3 (three) times daily at 8am, 3pm and bedtime for 2 days. 6 tablet Iola Lukes, FNP   ondansetron  (ZOFRAN -ODT) 8 MG disintegrating tablet Take 1 tablet (8 mg total) by mouth every 8 (eight) hours as needed for nausea or vomiting. 12 tablet Iola Lukes, FNP      PDMP not reviewed this encounter.     [1]  Social History Tobacco Use   Smoking status: Never   Smokeless tobacco: Never  Vaping Use   Vaping status: Never Used  Substance Use Topics   Alcohol use: Not Currently    Comment: rare   Drug use: No     Iola Lukes, FNP 03/21/24 1346  "

## 2024-03-21 NOTE — Discharge Instructions (Addendum)
 You were seen today for symptoms consistent with a urinary tract infection (UTI). You have been prescribed Macrobid  to treat the infection and Pyridium  to help relieve discomfort such as burning, urgency, and bladder pressure. Take the antibiotics exactly as prescribed and complete the full course, even if you start feeling better. Pyridium   may cause your urine to change color, which is a normal side effect of the medication. A urine culture has been sent to identify if there is a specific bacteria causing your symptoms. You will only be contacted if your results are abnormal; otherwise, you may review them in your MyChart account.   It is important to stay well hydrated by drinking plenty of fluids throughout the day. This helps flush out your urinary system and keeps your urine light yellow, which is a sign of good hydration. Avoid caffeine and alcohol, as they can irritate the bladder. Be sure to urinate regularly and empty your bladder fully. Do not hold your urine for extended periods. Always wipe from front to back after using the bathroom and use a clean tissue for each wipe. Avoid douching or using sprays or powders in the genital area, as these can cause irritation. Follow up with your healthcare provider if your symptoms do not improve within a few days, get worse, or return after completing your treatment.

## 2024-03-23 ENCOUNTER — Ambulatory Visit: Payer: Self-pay

## 2024-03-23 LAB — URINE CULTURE: Culture: NO GROWTH

## 2024-03-24 ENCOUNTER — Ambulatory Visit (INDEPENDENT_AMBULATORY_CARE_PROVIDER_SITE_OTHER): Payer: PRIVATE HEALTH INSURANCE | Admitting: Internal Medicine

## 2024-03-24 ENCOUNTER — Encounter: Payer: Self-pay | Admitting: Internal Medicine

## 2024-03-24 VITALS — BP 102/60 | HR 73 | Ht 59.0 in | Wt 225.4 lb

## 2024-03-24 DIAGNOSIS — Z8719 Personal history of other diseases of the digestive system: Secondary | ICD-10-CM | POA: Diagnosis not present

## 2024-03-24 DIAGNOSIS — K760 Fatty (change of) liver, not elsewhere classified: Secondary | ICD-10-CM | POA: Diagnosis not present

## 2024-03-24 DIAGNOSIS — K219 Gastro-esophageal reflux disease without esophagitis: Secondary | ICD-10-CM | POA: Diagnosis not present

## 2024-03-24 NOTE — Progress Notes (Signed)
 "  Subjective:    Patient ID: Deborah Watts, female    DOB: 1965/11/16, 59 y.o.   MRN: [id number]  HPI Deborah Watts is a 59 year old female with metabolic dysfunction-associated steatotic liver disease who presents for follow-up of ongoing right upper quadrant abdominal discomfort.  Ongoing right upper quadrant abdominal discomfort described as painful and pushing up under the rib cage. Reports the liver feels large and uncomfortable, living up under there. Most recent CT scan on 03/13/2023 was performed to evaluate the liver. Prior cholecystectomy and appendectomy. Laboratory studies from 02/05/2024 showed normal liver enzymes, total bilirubin, alkaline phosphatase, and albumin. Hemoglobin A1c on 11/09/2023 was 6.6. Weight today is 225 lbs, down from 226 lbs at last visit and 236 lbs on home scale. Currently taking pantoprazole  40 mg daily and Zofran  8 mg every eight hours as needed for nausea.  Continued symptoms of GERD with esophagitis, including acid reflux, occasional gagging, and phlegm, particularly after eating and sometimes at night. Sometimes awakens from sleep with reflux and worries about choking. Pantoprazole  40 mg daily provides partial relief, but breakthrough symptoms persist. History of esophageal dilation for dysphagia which she notes is helpful without recurrent dysphagia..  Intermittent left lower quadrant abdominal pain and tenderness associated with eating seedy foods such as blackberries and cucumbers. Pain lasts several days and is localized to the left lower abdomen. Previously treated with ciprofloxacin  and metronidazole  by primary care, which resolved symptoms. Avoids nuts and popcorn and limits seedy foods to reduce discomfort. Most recent colonoscopy last year was normal, with diverticulosis noted in the sigmoid and descending colon.  Takes vitamin D  with magnesium supplements, which help with leg cramps.. Stools are never normal and loose at times.  Has made  dietary changes for weight loss, including increased protein intake and shopping for produce at [employer]. Does not eat nuts or popcorn.  No LE edema.  No itching.   Review of Systems As per HPI, otherwise negative  Current Medications, Allergies, Past Medical History, Past Surgical History, Family History and Social History were reviewed in [employer] record.    Objective:   Physical Exam BP 102/60   Pulse 73   Ht 4' 11 (1.499 m)   Wt 225 lb 6 oz (102.2 kg)   BMI 45.52 kg/m  Gen: awake, alert, NAD HEENT: anicteric  Abd: soft, obese, NT/ND, +BS throughout Ext: no c/c/e Neuro: nonfocal  Labs Hemoglobin (02/05/2024): 13.4 MCV (02/05/2024): 87.8 Platelet count (02/05/2024): 250 WBC (02/05/2024): 9.3 AST (02/05/2024): 26 ALT (02/05/2024): 28 Total bilirubin (02/05/2024): 0.3 Alk phos (02/05/2024): 87 Albumin (02/05/2024): 4.1 Lipase (02/05/2024): 9 Amylase (02/05/2024): 20 Hemoglobin A1c (11/09/2023): 6.6  Radiology Abdominal CT (03/13/2023): Moderate diffuse hepatic steatosis and hepatomegaly. No suspicious hepatic mass. Status post cholecystectomy. No evidence of biliary obstruction.  RADIOLOGY MR pelvis: Small left ovarian cysts, prior hysterectomy and salpingo-oophorectomy (03/25/2023)  DIAGNOSTIC EGD: Normal esophagus, dilated to 18mm, mild vascular congestion, focal squamous ballooning, reflux esophagitis, normal stomach and duodenum, mild nonspecific reactive gastropathy, no H. pylori, normal duodenal biopsies (09/29/2023)  Colonoscopy: Two 3-16mm hyperplastic polyps in rectum, diverticula in sigmoid and descending colon (09/29/2023)       Assessment & Plan:  Metabolic dysfunction-associated steatotic liver disease Chronic moderate hepatic steatosis with hepatomegaly and minimal inflammation. Normal liver enzymes. Weight loss expected to improve condition. Further fibrosis assessment pending. - Provided anticipatory guidance on 10-20%  weight loss benefits for hepatic steatosis, glycemic control, and well-being. - Ordered FibroScan for liver  stiffness and steatosis quantification. - Discussed GLP-1 agonist therapy initiation pending FibroScan results. - Planned GLP-1 agonist therapy initiation post-FibroScan and insurance approval. - Summary of GLP-1 trials for metabolic dysfunction associated steatohepatitis: Semaglutide (phase 3): At week 72 -- 62.9% of patients on semaglutide had resolution of MASH compared to 34% on placebo; 37% had improvement by at least 1 stage of fibrosis compared to 22% on placebo. Tirzepatide (phase 2): At 52 weeks -- 44% to 62% of patients on tirzepatide had resolution of MASH compared to 10% on placebo; 51 to 55% had improvement by at least 1 stage of fibrosis compared to 30% on placebo - Planned follow-up in 6 months or sooner if needed.  Gastroesophageal reflux disease Chronic GERD with esophagitis and persistent symptoms despite pantoprazole . Weight loss may improve symptoms. Aspiration risk discussed. - Continued pantoprazole  40 mg daily. - Added famotidine  20 mg at night (generic, over-the-counter). - Provided education on famotidine  purchase sources. - Discussed weight loss benefits for GERD symptoms. - Provided anticipatory guidance on aspiration risk and airway symptoms.  Diverticulitis of the sigmoid and descending colon Recurrent diverticulitis with intermittent pain triggered by certain foods. Normal recent colonoscopy. - Provided dietary counseling on avoiding trigger foods - Reviewed normal colonoscopy, next screening exam would be July 2035.    30 minutes total spent today including patient facing time, coordination of care, reviewing medical history/procedures/pertinent radiology studies, and documentation of the encounter.  "

## 2024-03-24 NOTE — Patient Instructions (Addendum)
 Continue pantoprazole  40 mg daily.   Start over the counter famotidine  20 mg at bedtime.  We will contact you when our Fibroscan becomes available.   _______________________________________________________  If your blood pressure at your visit was 140/90 or greater, please contact your primary care physician to follow up on this.  _______________________________________________________  If you are age 60 or older, your body mass index should be between 23-30. Your Body mass index is 45.52 kg/m. If this is out of the aforementioned range listed, please consider follow up with your Primary Care Provider.  If you are age 88 or younger, your body mass index should be between 19-25. Your Body mass index is 45.52 kg/m. If this is out of the aformentioned range listed, please consider follow up with your Primary Care Provider.   ________________________________________________________  The [facility] providers would like to encourage you to use MYCHART to communicate with providers for non-urgent requests or questions.  Due to long hold times on the telephone, sending your provider a message by [facility] may be a faster and more efficient way to get a response.  Please allow 48 business hours for a response.  Please remember that this is for non-urgent requests.  _______________________________________________________  [facility] is using a team-based approach to care.  Your team is made up of your doctor and two to three APPS. Our APPS (Nurse Practitioners and Physician Assistants) work with your physician to ensure care continuity for you. They are fully qualified to address your health concerns and develop a treatment plan. They communicate directly with your gastroenterologist to care for you. Seeing the Advanced Practice Practitioners on your physician's team can help you by facilitating care more promptly, often allowing for earlier appointments, access to diagnostic testing,  procedures, and other specialty referrals.

## 2024-04-01 ENCOUNTER — Telehealth: Admitting: Physician Assistant

## 2024-04-01 DIAGNOSIS — J069 Acute upper respiratory infection, unspecified: Secondary | ICD-10-CM

## 2024-04-01 DIAGNOSIS — H6690 Otitis media, unspecified, unspecified ear: Secondary | ICD-10-CM

## 2024-04-01 MED ORDER — BENZONATATE 100 MG PO CAPS: 100.0000 mg | ORAL_CAPSULE | Freq: Three times a day (TID) | ORAL | 0 refills | Status: AC | PRN

## 2024-04-01 MED ORDER — IPRATROPIUM BROMIDE 0.03 % NA SOLN: 2.0000 | Freq: Two times a day (BID) | NASAL | 0 refills | Status: AC

## 2024-04-01 MED ORDER — AZITHROMYCIN 250 MG PO TABS: ORAL_TABLET | ORAL | 0 refills | Status: AC

## 2024-04-01 NOTE — Progress Notes (Signed)
 Message sent to patient requesting further input regarding current symptoms. Awaiting patient response.

## 2024-04-01 NOTE — Progress Notes (Signed)
 We are sorry you are not feeling well.  Here is how we plan to help!  Based on what you have shared with me, it looks like you may have a viral upper respiratory infection.  Upper respiratory infections are caused by a large number of viruses; however, rhinovirus is the most common cause.   Symptoms vary from person to person, with common symptoms including sore throat, cough, and fatigue or lack of energy, and a feeling of general discomfort.  A low-grade fever of up to 100.4 may present, but is often uncommon.  Symptoms vary however, and are closely related to a person's age or underlying illnesses.  The most common symptoms associated with an upper respiratory infection are nasal discharge or congestion, cough, sneezing, headache and pressure in the ears and face.  These symptoms usually persist for about 3 to 10 days, but can last up to 2 weeks.  It is important to know that upper respiratory infections do not cause serious illness or complications in most cases.    Upper respiratory infections can be transmitted from person to person, with the most common method of transmission being a person's hands.  The virus is able to live on the skin and can infect other persons for up to 2 hours after direct contact.  Also, these can be transmitted when someone coughs or sneezes; thus, it is important to cover the mouth to reduce this risk.  To keep the spread of the illness at bay, good hand hygiene is very important!  Because this is a viral infection, there are no specific treatments other than to help you with the symptoms until the infection runs its course.    For nasal congestion, you may use an oral decongestants such as Mucinex  D or if you have glaucoma or high blood pressure use plain Mucinex .  Saline nasal spray or nasal drops can help and can safely be used as often as needed for congestion.  For your congestion, I have prescribed Ipratropium Bromide  nasal spray 0.03% two sprays in each nostril 2-3  times a day  If you do not have a history of heart disease, hypertension, diabetes or thyroid  disease, prostate/bladder issues or glaucoma, you may also use Sudafed to treat nasal congestion.  It is highly recommended that you consult with a pharmacist or your primary care physician to ensure this medication is safe for you to take.     If you have a cough, you may use over-the-counter cough suppressants such as Delsym and Robitussin.  If you have glaucoma or high blood pressure, you can also use Coricidin HBP.   For cough I have prescribed for you A prescription cough medication called Tessalon  Perles 100 mg. You may take 1-2 capsules every 8 hours as needed for cough  If you have a sore or scratchy throat, use a saltwater gargle-  to  teaspoon of salt dissolved in a 4-ounce to 8-ounce glass of warm water.  Gargle the solution for approximately 15-30 seconds and then spit.  It is important not to swallow the solution.  You can also use throat lozenges/cough drops and Chloraseptic spray to help with throat pain or discomfort.  Warm or cold liquids can also be helpful in relieving throat pain.  For headache, pain, or general discomfort, you can use Ibuprofen  or Tylenol  as directed.   Some authorities believe that zinc sprays or the use of Echinacea may shorten the course of your symptoms.  Because of substantial ear pain and associated  gland swelling, I am also worried about an ear infection. As such, I have also sent in an antibiotic, Azithromycin , to take as directed.   HOME CARE Only take medications as instructed by your medical team. Be sure to drink plenty of fluids. Water is fine as well as fruit juices, sodas and electrolyte beverages. You may want to stay away from caffeine or alcohol. If you are nauseated, try taking small sips of liquids. How do you know if you are getting enough fluid? Your urine should be a pale yellow or almost colorless. Get rest. Taking a steamy shower or using a  humidifier may help nasal congestion and ease sore throat pain. You can place a towel over your head and breathe in the steam from hot water coming from a faucet. Using a saline nasal spray works much the same way. Cough drops, hard candies and sore throat lozenges may ease your cough. Avoid close contacts especially the very young and the elderly Cover your mouth if you cough or sneeze Always remember to wash your hands.   GET HELP RIGHT AWAY IF: You develop worsening fever. If your symptoms do not improve within 10 days You develop yellow or green discharge from your nose over 3 days. You have coughing fits You develop a severe head ache or visual changes. You develop shortness of breath, difficulty breathing or start having chest pain Your symptoms persist after you have completed your treatment plan  MAKE SURE YOU  Understand these instructions. Will watch your condition. Will get help right away if you are not doing well or get worse.  Your e-visit answers were reviewed by a board certified advanced clinical practitioner to complete your personal care plan. Depending upon the condition, your plan could have included both over-the-counter or prescription medications.  Please review your pharmacy choice. If there is a problem, you may call our nursing hot line at and have the prescription routed to another pharmacy. Your safety is important to us . If you have drug allergies, check your prescription carefully.   You can use MyChart to ask questions about todays visit, request a non-urgent call back, or ask for a work or school excuse for 24 hours related to this e-Visit. If it has been greater than 24 hours you will need to follow up with your provider, or enter a new e-Visit to address those concerns. You will get an e-mail in the next two days asking about your experience.  I hope that your e-visit has been valuable and will speed your recovery. Thank you for using e-visits.   I  have spent 9 minutes in review of e-visit questionnaire, review and updating patient chart, medical decision making and response to patient.   Elsie Velma Lunger, PA-C

## 2024-04-15 ENCOUNTER — Other Ambulatory Visit: Payer: Self-pay | Admitting: Family

## 2024-04-15 DIAGNOSIS — F411 Generalized anxiety disorder: Secondary | ICD-10-CM

## 2024-04-15 NOTE — Telephone Encounter (Signed)
 Requesting: alprazolam  0.5mg  Contract:05/02/21 UDS: 05/02/21 Last Visit: 02/05/24 Next Visit: 05/09/24 Last Refill: 03/15/24 #90 and 0RF   Please Advise

## 2024-05-09 ENCOUNTER — Encounter: Payer: BC Managed Care – PPO | Admitting: Family Medicine
